# Patient Record
Sex: Male | Born: 1955 | Race: White | Hispanic: No | Marital: Married
Health system: Southern US, Community
[De-identification: ages and names within clinical notes are randomized; demographics above are authoritative.]

## PROBLEM LIST (undated history)

## (undated) DIAGNOSIS — E7800 Pure hypercholesterolemia, unspecified: Secondary | ICD-10-CM

## (undated) DIAGNOSIS — I10 Essential (primary) hypertension: Secondary | ICD-10-CM

## (undated) DIAGNOSIS — G4730 Sleep apnea, unspecified: Secondary | ICD-10-CM

## (undated) DIAGNOSIS — I639 Cerebral infarction, unspecified: Secondary | ICD-10-CM

## (undated) HISTORY — DX: Essential (primary) hypertension: I10

## (undated) HISTORY — DX: Sleep apnea, unspecified: G47.30

## (undated) HISTORY — PX: BACK SURGERY: SHX140

## (undated) HISTORY — PX: CARDIAC CATHETERIZATION: SHX172

## (undated) SURGERY — Surgical Case
Anesthesia: *Unknown

---

## 2000-09-15 ENCOUNTER — Emergency Department (HOSPITAL_COMMUNITY): Admission: EM | Admit: 2000-09-15 | Discharge: 2000-09-15 | Payer: Self-pay | Admitting: Emergency Medicine

## 2002-06-29 ENCOUNTER — Encounter: Admission: RE | Admit: 2002-06-29 | Discharge: 2002-06-29 | Payer: Self-pay | Admitting: Family Medicine

## 2002-06-29 ENCOUNTER — Encounter: Payer: Self-pay | Admitting: Family Medicine

## 2002-06-30 ENCOUNTER — Encounter: Admission: RE | Admit: 2002-06-30 | Discharge: 2002-06-30 | Payer: Self-pay | Admitting: Family Medicine

## 2002-06-30 ENCOUNTER — Encounter: Payer: Self-pay | Admitting: Family Medicine

## 2003-05-29 ENCOUNTER — Encounter: Admission: RE | Admit: 2003-05-29 | Discharge: 2003-05-29 | Payer: Self-pay | Admitting: Specialist

## 2003-07-10 ENCOUNTER — Encounter: Admission: RE | Admit: 2003-07-10 | Discharge: 2003-07-10 | Payer: Self-pay | Admitting: Neurosurgery

## 2003-09-15 ENCOUNTER — Inpatient Hospital Stay (HOSPITAL_COMMUNITY): Admission: RE | Admit: 2003-09-15 | Discharge: 2003-09-17 | Payer: Self-pay | Admitting: Neurosurgery

## 2010-02-03 HISTORY — PX: KNEE SURGERY: SHX244

## 2011-04-11 ENCOUNTER — Other Ambulatory Visit: Payer: Self-pay | Admitting: Family Medicine

## 2011-04-11 DIAGNOSIS — N281 Cyst of kidney, acquired: Secondary | ICD-10-CM

## 2011-04-15 ENCOUNTER — Ambulatory Visit
Admission: RE | Admit: 2011-04-15 | Discharge: 2011-04-15 | Disposition: A | Discharge status: Home / Self Care | Payer: BC Managed Care – PPO | Source: Ambulatory Visit | Attending: Family Medicine | Admitting: Family Medicine

## 2011-04-15 DIAGNOSIS — N281 Cyst of kidney, acquired: Secondary | ICD-10-CM

## 2012-01-17 ENCOUNTER — Encounter (HOSPITAL_COMMUNITY): Payer: Self-pay | Admitting: Internal Medicine

## 2012-01-17 ENCOUNTER — Observation Stay (HOSPITAL_COMMUNITY)
Admission: EM | Admit: 2012-01-17 | Discharge: 2012-01-20 | Disposition: A | Discharge status: Home / Self Care | Payer: BC Managed Care – PPO | Attending: Internal Medicine | Admitting: Internal Medicine

## 2012-01-17 ENCOUNTER — Emergency Department (HOSPITAL_COMMUNITY): Payer: BC Managed Care – PPO

## 2012-01-17 DIAGNOSIS — F29 Unspecified psychosis not due to a substance or known physiological condition: Secondary | ICD-10-CM | POA: Insufficient documentation

## 2012-01-17 DIAGNOSIS — R269 Unspecified abnormalities of gait and mobility: Secondary | ICD-10-CM | POA: Insufficient documentation

## 2012-01-17 DIAGNOSIS — R4789 Other speech disturbances: Secondary | ICD-10-CM | POA: Insufficient documentation

## 2012-01-17 DIAGNOSIS — G454 Transient global amnesia: Secondary | ICD-10-CM

## 2012-01-17 DIAGNOSIS — Z79899 Other long term (current) drug therapy: Secondary | ICD-10-CM | POA: Insufficient documentation

## 2012-01-17 DIAGNOSIS — G4700 Insomnia, unspecified: Secondary | ICD-10-CM | POA: Insufficient documentation

## 2012-01-17 DIAGNOSIS — R4182 Altered mental status, unspecified: Secondary | ICD-10-CM

## 2012-01-17 DIAGNOSIS — G9340 Encephalopathy, unspecified: Principal | ICD-10-CM | POA: Insufficient documentation

## 2012-01-17 DIAGNOSIS — E785 Hyperlipidemia, unspecified: Secondary | ICD-10-CM

## 2012-01-17 DIAGNOSIS — E7800 Pure hypercholesterolemia, unspecified: Secondary | ICD-10-CM | POA: Insufficient documentation

## 2012-01-17 DIAGNOSIS — H532 Diplopia: Secondary | ICD-10-CM | POA: Insufficient documentation

## 2012-01-17 DIAGNOSIS — F411 Generalized anxiety disorder: Secondary | ICD-10-CM | POA: Insufficient documentation

## 2012-01-17 HISTORY — DX: Pure hypercholesterolemia, unspecified: E78.00

## 2012-01-17 LAB — URINALYSIS, ROUTINE W REFLEX MICROSCOPIC
Glucose, UA: NEGATIVE mg/dL
Hgb urine dipstick: NEGATIVE
Leukocytes, UA: NEGATIVE
Specific Gravity, Urine: 1.011 (ref 1.005–1.030)
pH: 7 (ref 5.0–8.0)

## 2012-01-17 LAB — RAPID URINE DRUG SCREEN, HOSP PERFORMED
Amphetamines: NOT DETECTED
Benzodiazepines: NOT DETECTED
Cocaine: NOT DETECTED
Opiates: NOT DETECTED

## 2012-01-17 LAB — CBC
HCT: 39.2 % (ref 39.0–52.0)
Hemoglobin: 13.8 g/dL (ref 13.0–17.0)
MCV: 85.6 fL (ref 78.0–100.0)
RBC: 4.58 MIL/uL (ref 4.22–5.81)
RDW: 13 % (ref 11.5–15.5)
WBC: 5.9 10*3/uL (ref 4.0–10.5)

## 2012-01-17 LAB — COMPREHENSIVE METABOLIC PANEL
Albumin: 3.9 g/dL (ref 3.5–5.2)
BUN: 13 mg/dL (ref 6–23)
Calcium: 9.4 mg/dL (ref 8.4–10.5)
Creatinine, Ser: 0.9 mg/dL (ref 0.50–1.35)
GFR calc Af Amer: >90 mL/min (ref >90)
Glucose, Bld: 108 mg/dL — ABNORMAL HIGH (ref 70–99)
Potassium: 4.5 mEq/L (ref 3.5–5.1)
Total Protein: 7.2 g/dL (ref 6.0–8.3)

## 2012-01-17 MED ORDER — ASPIRIN 81 MG PO CHEW
324.0000 mg | CHEWABLE_TABLET | Freq: Once | ORAL | Status: AC
  Administered 2012-01-17: 324 mg via ORAL
  Filled 2012-01-17: qty 4
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
## 2012-01-18 ENCOUNTER — Observation Stay (HOSPITAL_COMMUNITY): Payer: BC Managed Care – PPO

## 2012-01-18 DIAGNOSIS — E785 Hyperlipidemia, unspecified: Secondary | ICD-10-CM

## 2012-01-18 DIAGNOSIS — G459 Transient cerebral ischemic attack, unspecified: Secondary | ICD-10-CM

## 2012-01-18 LAB — TROPONIN I
Troponin I: <0.3 ng/mL (ref <0.30)
Troponin I: <0.3 ng/mL (ref <0.30)
Troponin I: <0.3 ng/mL (ref <0.30)

## 2012-01-18 LAB — LIPID PANEL
Cholesterol: 186 mg/dL (ref 0–200)
VLDL: 26 mg/dL (ref 0–40)

## 2012-01-18 LAB — HEMOGLOBIN A1C: Mean Plasma Glucose: 126 mg/dL — ABNORMAL HIGH (ref <117)

## 2012-01-18 MED ORDER — PANTOPRAZOLE SODIUM 40 MG PO TBEC
40.0000 mg | DELAYED_RELEASE_TABLET | Freq: Every day | ORAL | Status: DC
  Administered 2012-01-18 – 2012-01-20 (×3): 40 mg via ORAL
  Filled 2012-01-18 (×3): qty 1

## 2012-01-18 MED ORDER — ACETAMINOPHEN 325 MG PO TABS
650.0000 mg | ORAL_TABLET | ORAL | Status: DC | PRN
  Administered 2012-01-18: 650 mg via ORAL
  Filled 2012-01-18: qty 2

## 2012-01-18 MED ORDER — ATORVASTATIN CALCIUM 40 MG PO TABS
40.0000 mg | ORAL_TABLET | Freq: Every day | ORAL | Status: DC
  Administered 2012-01-18 – 2012-01-19 (×2): 40 mg via ORAL
  Filled 2012-01-18 (×3): qty 1

## 2012-01-18 MED ORDER — ESCITALOPRAM OXALATE 20 MG PO TABS
20.0000 mg | ORAL_TABLET | Freq: Every day | ORAL | Status: DC
  Administered 2012-01-18 – 2012-01-20 (×3): 20 mg via ORAL
  Filled 2012-01-18 (×3): qty 1

## 2012-01-18 MED ORDER — ASPIRIN 325 MG PO TABS
325.0000 mg | ORAL_TABLET | Freq: Every day | ORAL | Status: DC
  Administered 2012-01-18 – 2012-01-20 (×3): 325 mg via ORAL
  Filled 2012-01-18 (×3): qty 1

## 2012-01-18 MED ORDER — SODIUM CHLORIDE 0.9 % IV SOLN
INTRAVENOUS | Status: AC
  Administered 2012-01-18: 01:00:00 via INTRAVENOUS
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
## 2012-01-18 NOTE — Progress Notes (Signed)
  Echocardiogram 2D Echocardiogram has been performed.  Jeffrey Morse 01/18/2012, 3:02 PM

## 2012-01-18 NOTE — Progress Notes (Signed)
Bilateral:  No evidence of hemodynamically significant internal carotid artery stenosis.   Vertebral artery flow is antegrade.     

## 2012-01-18 NOTE — Evaluation (Signed)
Physical Therapy Evaluation Patient Details Name: Jeffrey Morse MRN: [id number] DOB: 04/08/1955 Today's Date: 01/18/2012 Time: 4696-2952 PT Time Calculation (min): 28 min  PT Assessment / Plan / Recommendation Clinical Impression  Pt admitted with transient global amnesia. Pt currently with no physical deficits or balance deficits. Pt is safe to d/c from a PT standpoint, no further PT needs. Will not follow    PT Assessment  Patent does not need any further PT services    Follow Up Recommendations  No PT follow up    Does the patient have the potential to tolerate intense rehabilitation      Barriers to Discharge        Equipment Recommendations  None recommended by PT    Recommendations for Other Services     Frequency      Precautions / Restrictions Precautions Precautions: None Restrictions Weight Bearing Restrictions: No   Pertinent Vitals/Pain No pain      Mobility  Bed Mobility Bed Mobility: Supine to Sit;Sitting - Scoot to Edge of Bed;Sit to Supine Supine to Sit: 7: Independent Sitting - Scoot to Edge of Bed: 7: Independent Sit to Supine: 7: Independent Transfers Transfers: Sit to Stand;Stand to Sit Sit to Stand: 7: Independent Stand to Sit: 7: Independent Ambulation/Gait Ambulation/Gait Assistance: 7: Independent Ambulation Distance (Feet): 200 Feet Assistive device: None Gait Pattern: Within Functional Limits Gait velocity: normal gait speed Stairs: Yes Stairs Assistance: 7: Independent Stair Management Technique: No rails;Step to pattern;Forwards Number of Stairs: 3     Shoulder Instructions     Exercises     PT Diagnosis:    PT Problem List:   PT Treatment Interventions:     PT Goals    Visit Information  Last PT Received On: 01/18/12 Assistance Needed: +1    Subjective Data  Patient Stated Goal: to go home   Prior Functioning  Home Living Lives With: Spouse Available Help at Discharge: Family;Other (Comment) (works during  the day) Type of Home: Mobile home Home Access: Stairs to enter Secretary/administrator of Steps: 2 Entrance Stairs-Rails: Right;Left;Can reach both Home Layout: One level Bathroom Shower/Tub: Forensic scientist: Standard Bathroom Accessibility: Yes How Accessible: Accessible via walker Home Adaptive Equipment: None Prior Function Level of Independence: Independent Able to Take Stairs?: Yes Driving: Yes Vocation: Full time 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 difficulties Dominant Hand: Right    Cognition  Overall Cognitive Status: Appears within functional limits for tasks assessed/performed Arousal/Alertness: Awake/alert Orientation Level: Appears intact for tasks assessed Behavior During Session: [facility] for tasks performed    Extremity/Trunk Assessment Right Lower Extremity Assessment RLE ROM/Strength/Tone: Within functional levels RLE Sensation: WFL - Light Touch Left Lower Extremity Assessment LLE ROM/Strength/Tone: Within functional levels LLE Sensation: WFL - Light Touch   Balance Balance Balance Assessed: Yes Static Standing Balance Static Standing - Balance Support: No upper extremity supported Static Standing - Level of Assistance: 7: Independent Static Standing - Comment/# of Minutes: No deficits. Single Leg Stance - Right Leg: 1  Single Leg Stance - Left Leg: 1  Tandem Stance - Right Leg: 1  Tandem Stance - Left Leg: 1  [place] - Eyes Opened: 1  [place]homberg - Eyes Closed: 1   End of Session PT - End of Session Equipment Utilized During Treatment: Gait belt Activity Tolerance: Patient tolerated treatment well Patient left: in bed;with call bell/phone within reach;Other (comment) (with radiology ) Nurse Communication: Mobility status  GP Functional Assessment Tool Used: clinical judgement Functional Limitation: Mobility:  Walking and moving around Mobility: Walking and Moving Around Current Status  [phone]): 0 percent impaired, limited or restricted Mobility: Walking and Moving Around Goal Status [phone]): 0 percent impaired, limited or restricted Mobility: Walking and Moving Around Discharge Status [phone]): 0 percent impaired, limited or restricted   Milana Kidney 01/18/2012, 9:28 AM  01/18/2012 Milana Kidney DPT PAGER: [phone] OFFICE: [phone]

## 2012-01-18 NOTE — Progress Notes (Signed)
Occupational Therapy Note  OT order received and appreciated.  Per PT report, pt is at baseline (independent) with no acute OT needs.  Will sign off at this time. Please re-order if pt experiences functional decline.  Thanks.  01/18/2012 Cipriano Mile OTR/L Pager [phone] Office [phone]
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
## 2012-01-19 ENCOUNTER — Observation Stay (HOSPITAL_COMMUNITY): Payer: BC Managed Care – PPO

## 2012-01-19 MED ORDER — GADOBENATE DIMEGLUMINE 529 MG/ML IV SOLN
20.0000 mL | Freq: Once | INTRAVENOUS | Status: AC
  Administered 2012-01-18: 20 mL via INTRAVENOUS
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
## 2012-01-19 NOTE — Progress Notes (Signed)
EEG completed.

## 2012-01-20 DIAGNOSIS — R4182 Altered mental status, unspecified: Secondary | ICD-10-CM
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
## 2012-01-20 NOTE — Procedures (Signed)
REFERRING PHYSICIAN:  Dr. Arbutus Leas.  INDICATION FOR STUDY:  A 55 year old man with history of transient confusion and diplopia.  Study is being performed to rule out possible new onset seizure disorder.  DESCRIPTION:  This is a routine EEG study performed during wakefulness. Predominant background activity consisted of 10 Hz symmetrical alpha rhythm which attenuated well with eye opening.  Photic stimulation produced symmetrical occipital driving response.  Hyperventilation produced a normal symmetrical slowing response.  No epileptiform discharges were recorded.  There were no areas of abnormal slowing.  INTERPRETATION:  This is a normal EEG recording during wakefulness.  No evidence of an epileptic disorder was demonstrated.     Noel Christmas, MD    [id number] D:  01/19/2012 17:31:36  T:  01/20/2012 03:39:55  Job #:  [id number]

## 2012-12-21 ENCOUNTER — Encounter: Payer: Self-pay | Admitting: Podiatry

## 2012-12-21 ENCOUNTER — Ambulatory Visit (INDEPENDENT_AMBULATORY_CARE_PROVIDER_SITE_OTHER): Payer: BC Managed Care – PPO | Admitting: Podiatry

## 2012-12-21 ENCOUNTER — Ambulatory Visit (INDEPENDENT_AMBULATORY_CARE_PROVIDER_SITE_OTHER): Payer: BC Managed Care – PPO

## 2012-12-21 VITALS — Ht 71.0 in | Wt 210.0 lb

## 2012-12-21 DIAGNOSIS — R52 Pain, unspecified: Secondary | ICD-10-CM

## 2012-12-21 DIAGNOSIS — M7750 Other enthesopathy of unspecified foot: Secondary | ICD-10-CM

## 2012-12-21 MED ORDER — MELOXICAM 15 MG PO TABS: 15.0000 mg | ORAL_TABLET | Freq: Every day | ORAL | Status: DC

## 2012-12-21 NOTE — Progress Notes (Signed)
  Subjective:    Patient ID: Jeffrey Morse, male    DOB: 03-05-1955, 57 y.o.   MRN: [id number]  Foot Pain This is a new problem. The current episode started more than 1 month ago. The problem occurs constantly. The problem has been gradually worsening. The symptoms are aggravated by standing and walking. He has tried nothing for the symptoms. The treatment provided no relief.      Review of Systems  All other systems reviewed and are negative.       Objective:   Physical Exam: I have reviewed his past medical history medications and allergies. Vital signs are stable he is alert and oriented x3. Pulses are palpable bilateral. Neurologic sensorium is intact per since once the monofilament bilateral. Deep tendon reflexes are intact bilateral muscle strength + over 5 dorsiflexors plantar flexors inverters and evertors are intact. All intrinsic musculature is intact. Orthopedic evaluation demonstrates all joints distal to the ankle have a full range of motion without crepitation. He has pain on end range of motion particularly plantar flexion at the second metatarsophalangeal joint bilateral. Pain is increased left second metatarsophalangeal joint with medial deviation of the toe. Radiographic evaluation demonstrates an elongated second metatarsal bilateral with medial medial deviation of the second digit at the metatarsophalangeal joint left. Cutaneous evaluation demonstrates supple well hydrated cutis no erythema edema cellulitis drainage or odor with mild exception of the edema overlying the second metatarsophalangeal joint left greater than right        Assessment & Plan:  Assessment: Capsulitis second metatarsophalangeal joint bilateral.  Plan: Discussed etiology pathology conservative versus surgical therapies. I injected para-articular only today Kenalog 20 mg to the point of maximal tenderness the bilateral second metatarsophalangeal joint. I also included a prescription for Mobic 15 mg  1 by mouth daily. I will followup with him in one month. We discussed in great detail today different types of shoes, prepped purchase and were not to purchase.

## 2013-01-20 ENCOUNTER — Ambulatory Visit: Payer: BC Managed Care – PPO | Admitting: Podiatry

## 2013-06-29 ENCOUNTER — Telehealth: Payer: Self-pay | Admitting: *Deleted

## 2013-06-29 NOTE — Telephone Encounter (Signed)
I returned his call.  He stated that he's starting to have a problem with his toes again.  The toe next to the big toe is starting to separate and it's giving him a problem.  He asked what should he do, should he see Dr. Al Corpus again.  I told him yes.  I transferred him to a scheduler to schedule an appointment with Dr. Al Corpus.

## 2013-07-12 ENCOUNTER — Ambulatory Visit: Payer: BC Managed Care – PPO | Admitting: Podiatry

## 2013-07-14 ENCOUNTER — Ambulatory Visit (INDEPENDENT_AMBULATORY_CARE_PROVIDER_SITE_OTHER): Payer: 59 | Admitting: Podiatry

## 2013-07-14 ENCOUNTER — Encounter: Payer: Self-pay | Admitting: Podiatry

## 2013-07-14 DIAGNOSIS — M2040 Other hammer toe(s) (acquired), unspecified foot: Secondary | ICD-10-CM

## 2013-07-14 DIAGNOSIS — M779 Enthesopathy, unspecified: Principal | ICD-10-CM

## 2013-07-14 DIAGNOSIS — M778 Other enthesopathies, not elsewhere classified: Secondary | ICD-10-CM

## 2013-07-14 DIAGNOSIS — M7750 Other enthesopathy of unspecified foot: Secondary | ICD-10-CM

## 2013-07-14 MED ORDER — DICLOFENAC SODIUM 75 MG PO TBEC: 75.0000 mg | DELAYED_RELEASE_TABLET | Freq: Two times a day (BID) | ORAL | Status: DC

## 2013-07-15 NOTE — Progress Notes (Signed)
He presents today for followup regarding capsulitis and hammertoe deformity of the second metatarsophalangeal joint of the left foot he states it seems to be getting worse. He states the toe is now starting to deviate from of the toes he points to the second digit of the left foot. He denies any further trauma he denies fever chills nausea vomiting muscle aches and pains.  Objective: Pulses are stable he is alert and oriented x3. Pulses are strongly palpable bilateral. He second digit of his left foot is arthritic in a plantar flexor position at the PIPJ. He has medial deviation of the second toe at the metatarsophalangeal joint more than likely secondary to rupture of the lateral collateral ligament due to chronic inflammation. Radiographic evaluation today does demonstrate rigid hammertoe deformity with elongated second metatarsal.  Assessment: Elongated plantarflexed second metatarsal with capsulitis of the metatarsophalangeal joint. 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  Plan: We discussed etiology pathology conservative versus surgical therapies secondary to failure of conservative therapies from previous visit at this point surgical intervention was discussed in great detail today we went over consent form line bylined number by number giving him ample time to ask questions he saw fit regarding a second metatarsal osteotomy of his left foot with screw and hammertoe repair second left with screw. He understands that her pain may be necessary. He was dispensed a Cam Walker today for the use postoperatively. I will followup with him near future for surgery.

## 2015-01-23 ENCOUNTER — Telehealth: Payer: Self-pay | Admitting: *Deleted

## 2015-02-02 NOTE — Telephone Encounter (Signed)
I attempted to call patient.  I left a message for a return call.  I'm returning your call.  You had called the other day.  "I have spoken to someone.  You were out when I called.  I just needed the prices for some codes.  They have helped me with that."  Do you have a date for surgery?  "No, I was just checking on the pricing.  I'll call later to schedule."

## 2016-05-07 ENCOUNTER — Institutional Professional Consult (permissible substitution): Payer: Self-pay | Admitting: Pulmonary Disease

## 2016-06-12 ENCOUNTER — Institutional Professional Consult (permissible substitution): Payer: Self-pay | Admitting: Pulmonary Disease

## 2016-09-01 ENCOUNTER — Ambulatory Visit (INDEPENDENT_AMBULATORY_CARE_PROVIDER_SITE_OTHER): Payer: BC Managed Care – PPO | Admitting: Pulmonary Disease

## 2016-09-01 ENCOUNTER — Encounter: Payer: Self-pay | Admitting: Pulmonary Disease

## 2016-09-01 DIAGNOSIS — G4733 Obstructive sleep apnea (adult) (pediatric): Secondary | ICD-10-CM | POA: Diagnosis not present

## 2016-09-01 NOTE — Patient Instructions (Signed)
Home sleep study. We discussed 2 options of treatment- CPAP and dental appliance  We will keep you pricing options based on study results

## 2016-09-01 NOTE — Assessment & Plan Note (Signed)
Home sleep study. We discussed 2 options of treatment- CPAP and dental appliance  We will keep you pricing options based on study results   The pathophysiology of obstructive sleep apnea , it's cardiovascular consequences & modes of treatment including CPAP were discused with the patient in detail & they evidenced understanding.

## 2016-09-01 NOTE — Progress Notes (Signed)
Subjective:    Patient ID: Jeffrey Morse, male    DOB: 06-May-1955, 61 y.o.   MRN: [id number]  HPI  Chief Complaint  Patient presents with  . Advice Only    Pt was referred by Dr. Manus Gunning due to being very restless at night and wakes up very tired. States that his wife will wake him up due to him stopping breathing and states that he also snores. Pt stated he had a sleep study done about 10 years ago.   61 year old man presents for evaluation of sleep-disordered breathing. 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 He reports restless and non-refreshing sleep and his wife has witnessed apneas. Several family members have complained about his snoring. He underwent an overnight polysomnogram in 2011 that showed an RDI of 24/hour with an AHI of 13/hour. CPAP was recommended but due to insurance costs, he did not proceed with this.  He is now ready to seek another evaluation for the same. Epworth sleepiness score is 8. Bedtime is between 9 and 10 PM, sleep latency is about 30 minutes, he sleeps on his side with one pillow reports numerous nocturnal awakenings and restless sleep with turning side to side. He is out of bed by 6:30 AM feeling tired with occasional headaches but denies dryness of mouth. There is no history suggestive of cataplexy, sleep paralysis or parasomnias  He is gained about 5 pounds in the last 5 years. He has hyperlipidemia. He quit smoking 20 years ago   Past Medical History:  Diagnosis Date  . Hypercholesteremia   . Hypertension    Borderline hypertension  . Sleep apnea      Past Surgical History:  Procedure Laterality Date  . BACK SURGERY    . KNEE SURGERY Left 2012    No Known Allergies   Social History   Social History  . Marital status: Divorced    Spouse name: N/A  . Number of children: N/A  . Years of education: N/A   Occupational History  . Not on file.   Social History Main Topics  . Smoking status: Former Games developer  . Smokeless  tobacco: Former Neurosurgeon     Comment: pt stated he was a social smoker in high school  . Alcohol use No  . Drug use: No  . Sexual activity: Not on file   Other Topics Concern  . Not on file   Social History Narrative  . No narrative on file      Family History  Problem Relation Age of Onset  . Colon cancer Mother   . Stroke Father        Review of Systems   Positive for acid heartburn and indigestion, tooth problems and nasal congestion and mild anxiety   Constitutional: negative for anorexia, fevers and sweats  Eyes: negative for irritation, redness and visual disturbance  Ears, nose, mouth, throat, and face: negative for earaches, epistaxis, nasal congestion and sore throat  Respiratory: negative for cough, dyspnea on exertion, sputum and wheezing  Cardiovascular: negative for chest pain, dyspnea, lower extremity edema, orthopnea, palpitations and syncope  Gastrointestinal: negative for abdominal pain, constipation, diarrhea, melena, nausea and vomiting  Genitourinary:negative for dysuria, frequency and hematuria  Hematologic/lymphatic: negative for bleeding, easy bruising and lymphadenopathy  Musculoskeletal:negative for arthralgias, muscle weakness and stiff joints  Neurological: negative for coordination problems, gait problems, headaches and weakness  Endocrine: negative for diabetic symptoms including polydipsia, polyuria and weight loss     Objective:   Physical Exam  Gen. Pleasant, obese, in no distress ENT - no lesions, no post nasal drip Neck: No JVD, no thyromegaly, no carotid bruits Lungs: no use of accessory muscles, no dullness to percussion, decreased without rales or rhonchi  Cardiovascular: Rhythm regular, heart sounds  normal, no murmurs or gallops, no peripheral edema Musculoskeletal: No deformities, no cyanosis or clubbing , no tremors       Assessment & Plan:

## 2017-07-10 ENCOUNTER — Telehealth: Payer: Self-pay | Admitting: Pulmonary Disease

## 2017-07-10 NOTE — Telephone Encounter (Signed)
Patient called back he would like to talk back with Misty Stanley regarding insurance needs- pt can be reached at [phone]

## 2017-07-10 NOTE — Telephone Encounter (Signed)
Spoke with pt. He is wanting to speak to Misty Stanley about this matter.

## 2017-07-10 NOTE — Telephone Encounter (Signed)
Pt is returning call. Pt is requesting to talk to [place]. Pt states when he called insurance company they informed him that he needs to have the HST and follow the recommendations given after HST.   Cb is [phone].

## 2017-07-10 NOTE — Telephone Encounter (Signed)
Returned call to Patient. Patient stated that his life insurance company was wanting to drop him, because he had missed his appointment with pulmonary.  He had sleep consult referred by Dr Lawerance Bach with [facility] 09/01/16, that he was at- E signature and DOB seen in chart.  Patient could not remember coming to visit.  Patient was concerned that his information and his son (has same first and last name) was crossed.  Patient and son OSA/HST information was not crossed.  [facility] had recommended that he have a HST (ordered 09/01/16), look into dental appliance or CPAP.  He did not have HST.  HST order is still active until 09/01/17.  Informed Patient he could still have HST, that [facility] recommended for OSA, but Patient refused.  Patient stated he just needed to know that he had not missed appointment with [facility].  No appointment missed, he did not get HST. Suggested to him, to call and see what information he may need for his life insurance.  He stated that he would call his life insurance and would call back if he needed any OV notes or information sent to life insurance.

## 2017-07-13 NOTE — Telephone Encounter (Addendum)
lmtcb x1 for pt. If pt would like to proceed with HST. Pt will need to call back after study to schedule OV.

## 2017-07-14 NOTE — Telephone Encounter (Signed)
ATC pt, no answer. Left message for pt to call back.  

## 2017-07-14 NOTE — Telephone Encounter (Signed)
Spoke with the pt  He declined sleep study  He states he needs a copy if his last ov note and the documentation on how he paid for his visit   [name], do you know how I could get the documentation on how he paid for his copay? Please advise thanks

## 2017-07-14 NOTE — Telephone Encounter (Signed)
Patient returned call, CB is [phone]

## 2017-07-16 NOTE — Telephone Encounter (Signed)
09/01/16 is the date he needs, thanks

## 2017-07-16 NOTE — Telephone Encounter (Signed)
I can print a summary from his account. Do you know the date of service he is needing this for? I can mail it to him once I have it printed. - Thanks- [employer]

## 2017-07-16 NOTE — Telephone Encounter (Signed)
Printed copay statement and mailed to patient -pr

## 2018-11-25 ENCOUNTER — Other Ambulatory Visit: Payer: Self-pay | Admitting: Neurosurgery

## 2018-11-25 DIAGNOSIS — M48061 Spinal stenosis, lumbar region without neurogenic claudication: Secondary | ICD-10-CM

## 2018-11-29 ENCOUNTER — Other Ambulatory Visit: Payer: Self-pay

## 2018-11-29 ENCOUNTER — Ambulatory Visit
Admission: RE | Admit: 2018-11-29 | Discharge: 2018-11-29 | Disposition: A | Discharge status: Home / Self Care | Payer: BC Managed Care – PPO | Source: Ambulatory Visit | Attending: Neurosurgery | Admitting: Neurosurgery

## 2018-11-29 DIAGNOSIS — M48061 Spinal stenosis, lumbar region without neurogenic claudication: Secondary | ICD-10-CM

## 2018-11-29 MED ORDER — METHYLPREDNISOLONE ACETATE 40 MG/ML INJ SUSP (RADIOLOG
120.0000 mg | Freq: Once | INTRAMUSCULAR | Status: AC
  Administered 2018-11-29: 120 mg via EPIDURAL

## 2018-11-29 MED ORDER — IOPAMIDOL (ISOVUE-M 200) INJECTION 41%
1.0000 mL | Freq: Once | INTRAMUSCULAR | Status: AC
  Administered 2018-11-29: 1 mL via EPIDURAL

## 2018-11-29 NOTE — Discharge Instructions (Signed)

## 2018-12-14 ENCOUNTER — Other Ambulatory Visit: Payer: Self-pay | Admitting: Neurosurgery

## 2018-12-14 DIAGNOSIS — M48061 Spinal stenosis, lumbar region without neurogenic claudication: Secondary | ICD-10-CM

## 2018-12-16 ENCOUNTER — Ambulatory Visit
Admission: RE | Admit: 2018-12-16 | Discharge: 2018-12-16 | Disposition: A | Discharge status: Home / Self Care | Payer: BC Managed Care – PPO | Source: Ambulatory Visit | Attending: Neurosurgery | Admitting: Neurosurgery

## 2018-12-16 ENCOUNTER — Other Ambulatory Visit: Payer: Self-pay

## 2018-12-16 DIAGNOSIS — M48061 Spinal stenosis, lumbar region without neurogenic claudication: Secondary | ICD-10-CM

## 2018-12-16 MED ORDER — METHYLPREDNISOLONE ACETATE 40 MG/ML INJ SUSP (RADIOLOG
120.0000 mg | Freq: Once | INTRAMUSCULAR | Status: AC
  Administered 2018-12-16: 120 mg via EPIDURAL

## 2018-12-16 MED ORDER — IOPAMIDOL (ISOVUE-M 200) INJECTION 41%
1.0000 mL | Freq: Once | INTRAMUSCULAR | Status: AC
  Administered 2018-12-16: 1 mL via EPIDURAL

## 2020-02-15 ENCOUNTER — Inpatient Hospital Stay (HOSPITAL_COMMUNITY)
Admission: EM | Disposition: A | Discharge status: Rehabilitation Facility | Payer: Self-pay | Source: Home / Self Care | Attending: Internal Medicine

## 2020-02-15 ENCOUNTER — Inpatient Hospital Stay (HOSPITAL_COMMUNITY): Payer: BC Managed Care – PPO

## 2020-02-15 ENCOUNTER — Inpatient Hospital Stay (HOSPITAL_COMMUNITY)
Admission: EM | Admit: 2020-02-15 | Discharge: 2020-02-28 | DRG: 215 | Disposition: A | Discharge status: Rehabilitation Facility | Payer: BC Managed Care – PPO | Attending: Internal Medicine | Admitting: Internal Medicine

## 2020-02-15 DIAGNOSIS — I2510 Atherosclerotic heart disease of native coronary artery without angina pectoris: Secondary | ICD-10-CM

## 2020-02-15 DIAGNOSIS — I6330 Cerebral infarction due to thrombosis of unspecified cerebral artery: Secondary | ICD-10-CM | POA: Insufficient documentation

## 2020-02-15 DIAGNOSIS — I213 ST elevation (STEMI) myocardial infarction of unspecified site: Secondary | ICD-10-CM

## 2020-02-15 DIAGNOSIS — Z4659 Encounter for fitting and adjustment of other gastrointestinal appliance and device: Secondary | ICD-10-CM

## 2020-02-15 DIAGNOSIS — K567 Ileus, unspecified: Secondary | ICD-10-CM

## 2020-02-15 DIAGNOSIS — I469 Cardiac arrest, cause unspecified: Secondary | ICD-10-CM | POA: Diagnosis not present

## 2020-02-15 DIAGNOSIS — I609 Nontraumatic subarachnoid hemorrhage, unspecified: Secondary | ICD-10-CM

## 2020-02-15 DIAGNOSIS — I2121 ST elevation (STEMI) myocardial infarction involving left circumflex coronary artery: Secondary | ICD-10-CM

## 2020-02-15 DIAGNOSIS — R570 Cardiogenic shock: Secondary | ICD-10-CM | POA: Diagnosis present

## 2020-02-15 DIAGNOSIS — Z0189 Encounter for other specified special examinations: Secondary | ICD-10-CM

## 2020-02-15 DIAGNOSIS — Z9289 Personal history of other medical treatment: Secondary | ICD-10-CM

## 2020-02-15 DIAGNOSIS — R0682 Tachypnea, not elsewhere classified: Secondary | ICD-10-CM

## 2020-02-15 DIAGNOSIS — R1110 Vomiting, unspecified: Secondary | ICD-10-CM

## 2020-02-15 DIAGNOSIS — R110 Nausea: Secondary | ICD-10-CM

## 2020-02-15 DIAGNOSIS — I5041 Acute combined systolic (congestive) and diastolic (congestive) heart failure: Secondary | ICD-10-CM

## 2020-02-15 DIAGNOSIS — J9601 Acute respiratory failure with hypoxia: Secondary | ICD-10-CM

## 2020-02-15 DIAGNOSIS — Z955 Presence of coronary angioplasty implant and graft: Secondary | ICD-10-CM

## 2020-02-15 DIAGNOSIS — Z01818 Encounter for other preprocedural examination: Secondary | ICD-10-CM

## 2020-02-15 DIAGNOSIS — Z978 Presence of other specified devices: Secondary | ICD-10-CM

## 2020-02-15 DIAGNOSIS — N170 Acute kidney failure with tubular necrosis: Secondary | ICD-10-CM | POA: Diagnosis not present

## 2020-02-15 DIAGNOSIS — E785 Hyperlipidemia, unspecified: Secondary | ICD-10-CM | POA: Diagnosis present

## 2020-02-15 DIAGNOSIS — E7800 Pure hypercholesterolemia, unspecified: Secondary | ICD-10-CM | POA: Diagnosis present

## 2020-02-15 DIAGNOSIS — I6349 Cerebral infarction due to embolism of other cerebral artery: Secondary | ICD-10-CM | POA: Diagnosis not present

## 2020-02-15 DIAGNOSIS — E876 Hypokalemia: Secondary | ICD-10-CM | POA: Diagnosis not present

## 2020-02-15 DIAGNOSIS — T82817A Embolism of cardiac prosthetic devices, implants and grafts, initial encounter: Secondary | ICD-10-CM | POA: Diagnosis not present

## 2020-02-15 DIAGNOSIS — J9811 Atelectasis: Secondary | ICD-10-CM | POA: Diagnosis not present

## 2020-02-15 DIAGNOSIS — R260 Ataxic gait: Secondary | ICD-10-CM | POA: Diagnosis not present

## 2020-02-15 DIAGNOSIS — Y718 Miscellaneous cardiovascular devices associated with adverse incidents, not elsewhere classified: Secondary | ICD-10-CM | POA: Diagnosis not present

## 2020-02-15 DIAGNOSIS — Z800 Family history of malignant neoplasm of digestive organs: Secondary | ICD-10-CM

## 2020-02-15 DIAGNOSIS — I639 Cerebral infarction, unspecified: Secondary | ICD-10-CM | POA: Diagnosis not present

## 2020-02-15 DIAGNOSIS — I2119 ST elevation (STEMI) myocardial infarction involving other coronary artery of inferior wall: Principal | ICD-10-CM | POA: Diagnosis present

## 2020-02-15 DIAGNOSIS — G8194 Hemiplegia, unspecified affecting left nondominant side: Secondary | ICD-10-CM | POA: Diagnosis not present

## 2020-02-15 DIAGNOSIS — R609 Edema, unspecified: Secondary | ICD-10-CM | POA: Diagnosis not present

## 2020-02-15 DIAGNOSIS — Z87891 Personal history of nicotine dependence: Secondary | ICD-10-CM

## 2020-02-15 DIAGNOSIS — I472 Ventricular tachycardia: Secondary | ICD-10-CM | POA: Diagnosis present

## 2020-02-15 DIAGNOSIS — I69354 Hemiplegia and hemiparesis following cerebral infarction affecting left non-dominant side: Secondary | ICD-10-CM | POA: Diagnosis not present

## 2020-02-15 DIAGNOSIS — I462 Cardiac arrest due to underlying cardiac condition: Secondary | ICD-10-CM | POA: Diagnosis present

## 2020-02-15 DIAGNOSIS — I5021 Acute systolic (congestive) heart failure: Secondary | ICD-10-CM | POA: Diagnosis present

## 2020-02-15 DIAGNOSIS — M7989 Other specified soft tissue disorders: Secondary | ICD-10-CM | POA: Diagnosis not present

## 2020-02-15 DIAGNOSIS — Z20822 Contact with and (suspected) exposure to covid-19: Secondary | ICD-10-CM | POA: Diagnosis present

## 2020-02-15 DIAGNOSIS — R29719 NIHSS score 19: Secondary | ICD-10-CM | POA: Diagnosis not present

## 2020-02-15 DIAGNOSIS — G928 Other toxic encephalopathy: Secondary | ICD-10-CM | POA: Diagnosis not present

## 2020-02-15 DIAGNOSIS — I110 Hypertensive heart disease with heart failure: Secondary | ICD-10-CM | POA: Diagnosis present

## 2020-02-15 DIAGNOSIS — I82611 Acute embolism and thrombosis of superficial veins of right upper extremity: Secondary | ICD-10-CM | POA: Diagnosis not present

## 2020-02-15 DIAGNOSIS — I4901 Ventricular fibrillation: Secondary | ICD-10-CM | POA: Diagnosis present

## 2020-02-15 DIAGNOSIS — R410 Disorientation, unspecified: Secondary | ICD-10-CM | POA: Diagnosis not present

## 2020-02-15 DIAGNOSIS — I610 Nontraumatic intracerebral hemorrhage in hemisphere, subcortical: Secondary | ICD-10-CM | POA: Diagnosis not present

## 2020-02-15 DIAGNOSIS — M549 Dorsalgia, unspecified: Secondary | ICD-10-CM | POA: Diagnosis present

## 2020-02-15 DIAGNOSIS — R471 Dysarthria and anarthria: Secondary | ICD-10-CM | POA: Diagnosis not present

## 2020-02-15 DIAGNOSIS — R1310 Dysphagia, unspecified: Secondary | ICD-10-CM | POA: Diagnosis not present

## 2020-02-15 DIAGNOSIS — I619 Nontraumatic intracerebral hemorrhage, unspecified: Secondary | ICD-10-CM | POA: Diagnosis not present

## 2020-02-15 DIAGNOSIS — G4700 Insomnia, unspecified: Secondary | ICD-10-CM | POA: Diagnosis not present

## 2020-02-15 DIAGNOSIS — D696 Thrombocytopenia, unspecified: Secondary | ICD-10-CM | POA: Diagnosis not present

## 2020-02-15 DIAGNOSIS — G8929 Other chronic pain: Secondary | ICD-10-CM | POA: Diagnosis present

## 2020-02-15 DIAGNOSIS — G9341 Metabolic encephalopathy: Secondary | ICD-10-CM | POA: Diagnosis not present

## 2020-02-15 DIAGNOSIS — J690 Pneumonitis due to inhalation of food and vomit: Secondary | ICD-10-CM | POA: Diagnosis not present

## 2020-02-15 DIAGNOSIS — G9340 Encephalopathy, unspecified: Secondary | ICD-10-CM | POA: Diagnosis not present

## 2020-02-15 DIAGNOSIS — Z823 Family history of stroke: Secondary | ICD-10-CM

## 2020-02-15 DIAGNOSIS — G4730 Sleep apnea, unspecified: Secondary | ICD-10-CM | POA: Diagnosis present

## 2020-02-15 DIAGNOSIS — T4275XA Adverse effect of unspecified antiepileptic and sedative-hypnotic drugs, initial encounter: Secondary | ICD-10-CM | POA: Diagnosis not present

## 2020-02-15 HISTORY — PX: LEFT HEART CATH AND CORONARY ANGIOGRAPHY: CATH118249

## 2020-02-15 HISTORY — DX: Pure hypercholesterolemia, unspecified: E78.00

## 2020-02-15 HISTORY — PX: VENTRICULAR ASSIST DEVICE INSERTION: CATH118273

## 2020-02-15 HISTORY — PX: CORONARY STENT INTERVENTION: CATH118234

## 2020-02-15 HISTORY — DX: Sleep apnea, unspecified: G47.30

## 2020-02-15 HISTORY — PX: RIGHT HEART CATH: CATH118263

## 2020-02-15 HISTORY — DX: Essential (primary) hypertension: I10

## 2020-02-15 LAB — POCT I-STAT 7, (LYTES, BLD GAS, ICA,H+H)
Acid-base deficit: 5 mmol/L — ABNORMAL HIGH (ref 0.0–2.0)
Acid-base deficit: 4 mmol/L — ABNORMAL HIGH (ref 0.0–2.0)
Bicarbonate: 22.3 mmol/L (ref 20.0–28.0)
Bicarbonate: 21.6 mmol/L (ref 20.0–28.0)
Calcium, Ion: 1.13 mmol/L — ABNORMAL LOW (ref 1.15–1.40)
Calcium, Ion: 1.05 mmol/L — ABNORMAL LOW (ref 1.15–1.40)
HCT: 43 % (ref 39.0–52.0)
HCT: 52 % (ref 39.0–52.0)
Hemoglobin: 14.6 g/dL (ref 13.0–17.0)
Hemoglobin: 17.7 g/dL — ABNORMAL HIGH (ref 13.0–17.0)
O2 Saturation: 86 %
O2 Saturation: 99 %
Patient temperature: 35.9
Potassium: 2.8 mmol/L — ABNORMAL LOW (ref 3.5–5.1)
Potassium: 5.4 mmol/L — ABNORMAL HIGH (ref 3.5–5.1)
Sodium: 139 mmol/L (ref 135–145)
Sodium: 138 mmol/L (ref 135–145)
TCO2: 24 mmol/L (ref 22–32)
TCO2: 23 mmol/L (ref 22–32)
pCO2 arterial: 48.9 mmHg — ABNORMAL HIGH (ref 32.0–48.0)
pCO2 arterial: 38.1 mmHg (ref 32.0–48.0)
pH, Arterial: 7.268 — ABNORMAL LOW (ref 7.350–7.450)
pH, Arterial: 7.357 (ref 7.350–7.450)
pO2, Arterial: 58 mmHg — ABNORMAL LOW (ref 83.0–108.0)
pO2, Arterial: 137 mmHg — ABNORMAL HIGH (ref 83.0–108.0)

## 2020-02-15 LAB — BASIC METABOLIC PANEL
Anion gap: 13 (ref 5–15)
Anion gap: 11 (ref 5–15)
Anion gap: 10 (ref 5–15)
BUN: 19 mg/dL (ref 8–23)
BUN: 21 mg/dL (ref 8–23)
BUN: 22 mg/dL (ref 8–23)
CO2: 22 mmol/L (ref 22–32)
CO2: 17 mmol/L — ABNORMAL LOW (ref 22–32)
CO2: 20 mmol/L — ABNORMAL LOW (ref 22–32)
Calcium: 8.1 mg/dL — ABNORMAL LOW (ref 8.9–10.3)
Calcium: 8.2 mg/dL — ABNORMAL LOW (ref 8.9–10.3)
Calcium: 8.1 mg/dL — ABNORMAL LOW (ref 8.9–10.3)
Chloride: 103 mmol/L (ref 98–111)
Chloride: 108 mmol/L (ref 98–111)
Chloride: 107 mmol/L (ref 98–111)
Creatinine, Ser: 1.08 mg/dL (ref 0.61–1.24)
Creatinine, Ser: 1.28 mg/dL — ABNORMAL HIGH (ref 0.61–1.24)
Creatinine, Ser: 1.48 mg/dL — ABNORMAL HIGH (ref 0.61–1.24)
GFR, Estimated: >60 mL/min (ref >60)
GFR, Estimated: >60 mL/min (ref >60)
GFR, Estimated: 53 mL/min — ABNORMAL LOW (ref >60)
Glucose, Bld: 333 mg/dL — ABNORMAL HIGH (ref 70–99)
Glucose, Bld: 239 mg/dL — ABNORMAL HIGH (ref 70–99)
Glucose, Bld: 266 mg/dL — ABNORMAL HIGH (ref 70–99)
Potassium: 3.2 mmol/L — ABNORMAL LOW (ref 3.5–5.1)
Potassium: 4.2 mmol/L (ref 3.5–5.1)
Potassium: 4 mmol/L (ref 3.5–5.1)
Sodium: 138 mmol/L (ref 135–145)
Sodium: 136 mmol/L (ref 135–145)
Sodium: 137 mmol/L (ref 135–145)

## 2020-02-15 LAB — RESP PANEL BY RT-PCR (FLU A&B, COVID) ARPGX2
Influenza A by PCR: NEGATIVE
Influenza B by PCR: NEGATIVE
SARS Coronavirus 2 by RT PCR: NEGATIVE

## 2020-02-15 LAB — POCT I-STAT EG7
Acid-base deficit: 3 mmol/L — ABNORMAL HIGH (ref 0.0–2.0)
Bicarbonate: 24 mmol/L (ref 20.0–28.0)
Calcium, Ion: 1.13 mmol/L — ABNORMAL LOW (ref 1.15–1.40)
HCT: 44 % (ref 39.0–52.0)
Hemoglobin: 15 g/dL (ref 13.0–17.0)
O2 Saturation: 68 %
Potassium: 2.8 mmol/L — ABNORMAL LOW (ref 3.5–5.1)
Sodium: 140 mmol/L (ref 135–145)
TCO2: 25 mmol/L (ref 22–32)
pCO2, Ven: 51.1 mmHg (ref 44.0–60.0)
pH, Ven: 7.279 (ref 7.250–7.430)
pO2, Ven: 40 mmHg (ref 32.0–45.0)

## 2020-02-15 LAB — TROPONIN I (HIGH SENSITIVITY)
Troponin I (High Sensitivity): 195 ng/L
Troponin I (High Sensitivity): 4374 ng/L (ref <18)

## 2020-02-15 LAB — COMPREHENSIVE METABOLIC PANEL
ALT: 54 U/L — ABNORMAL HIGH (ref 0–44)
AST: 73 U/L — ABNORMAL HIGH (ref 15–41)
Albumin: 3.5 g/dL (ref 3.5–5.0)
Alkaline Phosphatase: 91 U/L (ref 38–126)
Anion gap: 17 — ABNORMAL HIGH (ref 5–15)
BUN: 19 mg/dL (ref 8–23)
CO2: 15 mmol/L — ABNORMAL LOW (ref 22–32)
Calcium: 8.7 mg/dL — ABNORMAL LOW (ref 8.9–10.3)
Chloride: 104 mmol/L (ref 98–111)
Creatinine, Ser: 1.26 mg/dL — ABNORMAL HIGH (ref 0.61–1.24)
GFR, Estimated: >60 mL/min (ref >60)
Glucose, Bld: 324 mg/dL — ABNORMAL HIGH (ref 70–99)
Potassium: 2.9 mmol/L — ABNORMAL LOW (ref 3.5–5.1)
Sodium: 136 mmol/L (ref 135–145)
Total Bilirubin: 0.8 mg/dL (ref 0.3–1.2)
Total Protein: 6.1 g/dL — ABNORMAL LOW (ref 6.5–8.1)

## 2020-02-15 LAB — CBC
HCT: 39.2 % (ref 39.0–52.0)
Hemoglobin: 12.8 g/dL — ABNORMAL LOW (ref 13.0–17.0)
MCH: 28.8 pg (ref 26.0–34.0)
MCHC: 32.7 g/dL (ref 30.0–36.0)
MCV: 88.3 fL (ref 80.0–100.0)
Platelets: 311 10*3/uL (ref 150–400)
RBC: 4.44 MIL/uL (ref 4.22–5.81)
RDW: 13.1 % (ref 11.5–15.5)
WBC: 19.2 10*3/uL — ABNORMAL HIGH (ref 4.0–10.5)
nRBC: 0 % (ref 0.0–0.2)

## 2020-02-15 LAB — CBC WITH DIFFERENTIAL/PLATELET
Abs Immature Granulocytes: 0.1 K/uL — ABNORMAL HIGH (ref 0.00–0.07)
Basophils Absolute: 0.1 K/uL (ref 0.0–0.1)
Basophils Relative: 1 %
Eosinophils Absolute: 0.1 K/uL (ref 0.0–0.5)
Eosinophils Relative: 1 %
HCT: 45.7 % (ref 39.0–52.0)
Hemoglobin: 14.4 g/dL (ref 13.0–17.0)
Lymphocytes Relative: 37 %
Lymphs Abs: 5.3 K/uL — ABNORMAL HIGH (ref 0.7–4.0)
MCH: 28.6 pg (ref 26.0–34.0)
MCHC: 31.5 g/dL (ref 30.0–36.0)
MCV: 90.7 fL (ref 80.0–100.0)
Monocytes Absolute: 0.7 K/uL (ref 0.1–1.0)
Monocytes Relative: 5 %
Myelocytes: 1 %
Neutro Abs: 7.8 K/uL — ABNORMAL HIGH (ref 1.7–7.7)
Neutrophils Relative %: 55 %
Platelets: 280 K/uL (ref 150–400)
RBC: 5.04 MIL/uL (ref 4.22–5.81)
RDW: 13.1 % (ref 11.5–15.5)
WBC: 14.2 K/uL — ABNORMAL HIGH (ref 4.0–10.5)
nRBC: 0 /100{WBCs}
nRBC: 0.1 % (ref 0.0–0.2)

## 2020-02-15 LAB — GLUCOSE, CAPILLARY
Glucose-Capillary: 234 mg/dL — ABNORMAL HIGH (ref 70–99)
Glucose-Capillary: 244 mg/dL — ABNORMAL HIGH (ref 70–99)
Glucose-Capillary: 262 mg/dL — ABNORMAL HIGH (ref 70–99)
Glucose-Capillary: 290 mg/dL — ABNORMAL HIGH (ref 70–99)

## 2020-02-15 LAB — COOXEMETRY PANEL
Carboxyhemoglobin: 0.5 % (ref 0.5–1.5)
Carboxyhemoglobin: 0.5 % (ref 0.5–1.5)
Carboxyhemoglobin: 0.5 % (ref 0.5–1.5)
Methemoglobin: 1.1 % (ref 0.0–1.5)
Methemoglobin: 1.1 % (ref 0.0–1.5)
Methemoglobin: 1.2 % (ref 0.0–1.5)
O2 Saturation: 55.2 %
O2 Saturation: 47.6 %
O2 Saturation: 57.9 %
Total hemoglobin: 14.2 g/dL (ref 12.0–16.0)
Total hemoglobin: 13.2 g/dL (ref 12.0–16.0)
Total hemoglobin: 13.1 g/dL (ref 12.0–16.0)

## 2020-02-15 LAB — LIPID PANEL
Cholesterol: 207 mg/dL — ABNORMAL HIGH (ref 0–200)
HDL: 48 mg/dL (ref >40)
LDL Cholesterol: 135 mg/dL — ABNORMAL HIGH (ref 0–99)
Total CHOL/HDL Ratio: 4.3 RATIO
Triglycerides: 119 mg/dL (ref <150)
VLDL: 24 mg/dL (ref 0–40)

## 2020-02-15 LAB — LACTIC ACID, PLASMA
Lactic Acid, Venous: 6.1 mmol/L (ref 0.5–1.9)
Lactic Acid, Venous: 3.7 mmol/L (ref 0.5–1.9)

## 2020-02-15 LAB — POCT ACTIVATED CLOTTING TIME
Activated Clotting Time: 243 seconds
Activated Clotting Time: 255 seconds
Activated Clotting Time: 190 seconds
Activated Clotting Time: 178 seconds
Activated Clotting Time: 166 seconds
Activated Clotting Time: 154 seconds
Activated Clotting Time: 244 seconds

## 2020-02-15 LAB — MAGNESIUM
Magnesium: 1.7 mg/dL (ref 1.7–2.4)
Magnesium: 2 mg/dL (ref 1.7–2.4)

## 2020-02-15 LAB — PROTIME-INR
INR: 1.1 (ref 0.8–1.2)
Prothrombin Time: 14 s (ref 11.4–15.2)

## 2020-02-15 LAB — APTT: aPTT: 28 s (ref 24–36)

## 2020-02-15 LAB — HEMOGLOBIN A1C
Hgb A1c MFr Bld: 6 % — ABNORMAL HIGH (ref 4.8–5.6)
Mean Plasma Glucose: 125.5 mg/dL

## 2020-02-15 LAB — ECHOCARDIOGRAM LIMITED: Weight: 3386.27 oz

## 2020-02-15 LAB — LACTATE DEHYDROGENASE: LDH: 411 U/L — ABNORMAL HIGH (ref 98–192)

## 2020-02-15 SURGERY — LEFT HEART CATH AND CORONARY ANGIOGRAPHY
Anesthesia: LOCAL

## 2020-02-15 MED ORDER — FENTANYL 2500MCG IN NS 250ML (10MCG/ML) PREMIX INFUSION
0.0000 ug/h | INTRAVENOUS | Status: DC
  Administered 2020-02-15: 75 ug/h via INTRAVENOUS
  Administered 2020-02-16: 125 ug/h via INTRAVENOUS
  Administered 2020-02-16: 175 ug/h via INTRAVENOUS
  Administered 2020-02-17: 200 ug/h via INTRAVENOUS
  Administered 2020-02-17: 175 ug/h via INTRAVENOUS
  Administered 2020-02-18: 200 ug/h via INTRAVENOUS
  Administered 2020-02-18: 300 ug/h via INTRAVENOUS
  Administered 2020-02-19: 400 ug/h via INTRAVENOUS
  Administered 2020-02-20: 100 ug/h via INTRAVENOUS
  Filled 2020-02-15 (×8): qty 250

## 2020-02-15 MED ORDER — TICAGRELOR 90 MG PO TABS: 180.0000 mg | ORAL_TABLET | Freq: Once | ORAL | Status: DC

## 2020-02-15 MED ORDER — ATORVASTATIN CALCIUM 80 MG PO TABS
80.0000 mg | ORAL_TABLET | Freq: Every day | ORAL | Status: DC
  Administered 2020-02-15 – 2020-02-16 (×2): 80 mg
  Filled 2020-02-15 (×3): qty 1

## 2020-02-15 MED ORDER — ONDANSETRON HCL 4 MG/2ML IJ SOLN
4.0000 mg | Freq: Four times a day (QID) | INTRAMUSCULAR | Status: DC | PRN
  Administered 2020-02-17: 4 mg via INTRAVENOUS
  Filled 2020-02-15: qty 2

## 2020-02-15 MED ORDER — SODIUM BICARBONATE 8.4 % IV SOLN
INTRAVENOUS | Status: DC | PRN
  Administered 2020-02-15: 50 meq via INTRAVENOUS

## 2020-02-15 MED ORDER — MIDAZOLAM BOLUS VIA INFUSION
1.0000 mg | INTRAVENOUS | Status: DC | PRN
  Administered 2020-02-19 (×2): 2 mg via INTRAVENOUS
  Administered 2020-02-20 (×2): 1 mg via INTRAVENOUS
  Administered 2020-02-20 (×2): 2 mg via INTRAVENOUS
  Administered 2020-02-20: 1 mg via INTRAVENOUS
  Administered 2020-02-20 – 2020-02-21 (×4): 2 mg via INTRAVENOUS
  Filled 2020-02-15: qty 2

## 2020-02-15 MED ORDER — POLYETHYLENE GLYCOL 3350 17 G PO PACK
17.0000 g | PACK | Freq: Every day | ORAL | Status: DC
  Administered 2020-02-16 – 2020-02-18 (×3): 17 g
  Filled 2020-02-15 (×3): qty 1

## 2020-02-15 MED ORDER — ATORVASTATIN CALCIUM 80 MG PO TABS
80.0000 mg | ORAL_TABLET | Freq: Every day | ORAL | Status: DC
  Filled 2020-02-15 (×2): qty 1

## 2020-02-15 MED ORDER — ROCURONIUM BROMIDE 50 MG/5ML IV SOLN
100.0000 mg | Freq: Once | INTRAVENOUS | Status: AC
  Administered 2020-02-15: 100 mg via INTRAVENOUS
  Filled 2020-02-15: qty 10

## 2020-02-15 MED ORDER — HEPARIN SODIUM (PORCINE) 5000 UNIT/ML IJ SOLN
INTRAVENOUS | Status: DC
  Filled 2020-02-15: qty 10

## 2020-02-15 MED ORDER — SODIUM CHLORIDE 0.9 % IV SOLN
250.0000 mL | INTRAVENOUS | Status: DC | PRN
  Administered 2020-02-21: 500 mL via INTRAVENOUS
  Administered 2020-02-23: 250 mL via INTRAVENOUS

## 2020-02-15 MED ORDER — CANGRELOR BOLUS VIA INFUSION
INTRAVENOUS | Status: DC | PRN
  Administered 2020-02-15: 2880 ug via INTRAVENOUS

## 2020-02-15 MED ORDER — EPINEPHRINE PF 1 MG/ML IJ SOLN
INTRAMUSCULAR | Status: DC | PRN
  Administered 2020-02-15 (×2): 1 mg via SUBCUTANEOUS

## 2020-02-15 MED ORDER — ROCURONIUM BROMIDE 50 MG/5ML IV SOLN
INTRAVENOUS | Status: AC | PRN
  Administered 2020-02-15: 50 mg via INTRAVENOUS

## 2020-02-15 MED ORDER — AMIODARONE HCL IN DEXTROSE 360-4.14 MG/200ML-% IV SOLN
INTRAVENOUS | Status: AC | PRN
  Administered 2020-02-15 (×2): 30 mg/h via INTRAVENOUS

## 2020-02-15 MED ORDER — HEPARIN (PORCINE) 25000 UT/250ML-% IV SOLN
400.0000 [IU]/h | INTRAVENOUS | Status: DC
  Administered 2020-02-15: 100 [IU]/h via INTRAVENOUS
  Filled 2020-02-15: qty 250

## 2020-02-15 MED ORDER — AMIODARONE HCL 150 MG/3ML IV SOLN
INTRAVENOUS | Status: DC | PRN
  Administered 2020-02-15 (×2): 150 mg via INTRAVENOUS

## 2020-02-15 MED ORDER — ASPIRIN 81 MG PO CHEW
81.0000 mg | CHEWABLE_TABLET | Freq: Every day | ORAL | Status: DC
  Administered 2020-02-16 – 2020-02-22 (×7): 81 mg
  Filled 2020-02-15 (×7): qty 1

## 2020-02-15 MED ORDER — ETOMIDATE 2 MG/ML IV SOLN
INTRAVENOUS | Status: AC | PRN
  Administered 2020-02-15: 20 mg via INTRAVENOUS

## 2020-02-15 MED ORDER — EPINEPHRINE 1 MG/10ML IJ SOSY
PREFILLED_SYRINGE | INTRAMUSCULAR | Status: AC | PRN
  Administered 2020-02-15: 1 via INTRAVENOUS

## 2020-02-15 MED ORDER — FENTANYL 2500MCG IN NS 250ML (10MCG/ML) PREMIX INFUSION
INTRAVENOUS | Status: AC
  Filled 2020-02-15: qty 250

## 2020-02-15 MED ORDER — FENTANYL CITRATE (PF) 100 MCG/2ML IJ SOLN
INTRAMUSCULAR | Status: DC | PRN
  Administered 2020-02-15: 50 ug via INTRAVENOUS

## 2020-02-15 MED ORDER — TICAGRELOR 90 MG PO TABS
90.0000 mg | ORAL_TABLET | Freq: Two times a day (BID) | ORAL | Status: DC
  Administered 2020-02-16 – 2020-02-17 (×3): 90 mg
  Filled 2020-02-15 (×4): qty 1

## 2020-02-15 MED ORDER — DEXTROSE 50 % IV SOLN: 0.0000 mL | INTRAVENOUS | Status: DC | PRN

## 2020-02-15 MED ORDER — FENTANYL CITRATE (PF) 100 MCG/2ML IJ SOLN
50.0000 ug | Freq: Once | INTRAMUSCULAR | Status: DC
Start: 2020-02-15 — End: 2020-02-23

## 2020-02-15 MED ORDER — MAGNESIUM SULFATE 2 GM/50ML IV SOLN: 2.0000 g | Freq: Once | INTRAVENOUS | Status: AC

## 2020-02-15 MED ORDER — NOREPINEPHRINE 16 MG/250ML-% IV SOLN
0.0000 ug/min | INTRAVENOUS | Status: DC
  Administered 2020-02-15: 40 ug/min via INTRAVENOUS
  Administered 2020-02-16: 45 ug/min via INTRAVENOUS
  Administered 2020-02-17: 17 ug/min via INTRAVENOUS
  Administered 2020-02-17: 11 ug/min via INTRAVENOUS
  Administered 2020-02-18: 5 ug/min via INTRAVENOUS
  Filled 2020-02-15 (×6): qty 250

## 2020-02-15 MED ORDER — HEPARIN SODIUM (PORCINE) 1000 UNIT/ML IJ SOLN
INTRAMUSCULAR | Status: DC | PRN
  Administered 2020-02-15: 5000 [IU] via INTRAVENOUS
  Administered 2020-02-15: 6000 [IU] via INTRAVENOUS
  Administered 2020-02-15: 3000 [IU] via INTRAVENOUS

## 2020-02-15 MED ORDER — NOREPINEPHRINE BITARTRATE 1 MG/ML IV SOLN
INTRAVENOUS | Status: AC | PRN
  Administered 2020-02-15: 10 ug/min via INTRAVENOUS

## 2020-02-15 MED ORDER — SODIUM CHLORIDE 0.9% IV SOLUTION: INTRAVENOUS | Status: DC | PRN

## 2020-02-15 MED ORDER — INSULIN ASPART 100 UNIT/ML ~~LOC~~ SOLN
0.0000 [IU] | SUBCUTANEOUS | Status: DC
  Administered 2020-02-15: 5 [IU] via SUBCUTANEOUS
  Administered 2020-02-15: 3 [IU] via SUBCUTANEOUS
  Administered 2020-02-15: 5 [IU] via SUBCUTANEOUS
  Administered 2020-02-16: 3 [IU] via SUBCUTANEOUS
  Administered 2020-02-16: 2 [IU] via SUBCUTANEOUS

## 2020-02-15 MED ORDER — SODIUM CHLORIDE 0.9% FLUSH: 3.0000 mL | INTRAVENOUS | Status: DC | PRN

## 2020-02-15 MED ORDER — TICAGRELOR 90 MG PO TABS: 90.0000 mg | ORAL_TABLET | Freq: Two times a day (BID) | ORAL | Status: DC

## 2020-02-15 MED ORDER — HEPARIN SODIUM (PORCINE) 5000 UNIT/ML IJ SOLN: 5000.0000 [IU] | Freq: Three times a day (TID) | INTRAMUSCULAR | Status: DC

## 2020-02-15 MED ORDER — DOCUSATE SODIUM 50 MG/5ML PO LIQD
100.0000 mg | Freq: Two times a day (BID) | ORAL | Status: DC
  Administered 2020-02-15 – 2020-02-18 (×7): 100 mg
  Filled 2020-02-15 (×7): qty 10

## 2020-02-15 MED ORDER — MIDAZOLAM 50MG/50ML (1MG/ML) PREMIX INFUSION
0.5000 mg/h | INTRAVENOUS | Status: DC
  Administered 2020-02-15: 4 mg/h via INTRAVENOUS
  Administered 2020-02-15: 2 mg/h via INTRAVENOUS
  Administered 2020-02-16: 5 mg/h via INTRAVENOUS
  Administered 2020-02-17: 1 mg/h via INTRAVENOUS
  Administered 2020-02-19: 0.5 mg/h via INTRAVENOUS
  Administered 2020-02-20 – 2020-02-21 (×2): 2 mg/h via INTRAVENOUS
  Filled 2020-02-15 (×11): qty 50

## 2020-02-15 MED ORDER — NITROGLYCERIN 1 MG/10 ML FOR IR/CATH LAB
INTRA_ARTERIAL | Status: AC
  Filled 2020-02-15: qty 10

## 2020-02-15 MED ORDER — POTASSIUM CHLORIDE CRYS ER 20 MEQ PO TBCR: 80.0000 meq | EXTENDED_RELEASE_TABLET | Freq: Once | ORAL | Status: DC

## 2020-02-15 MED ORDER — DEXTROSE IN LACTATED RINGERS 5 % IV SOLN: INTRAVENOUS | Status: DC

## 2020-02-15 MED ORDER — MIDAZOLAM HCL 2 MG/2ML IJ SOLN
INTRAMUSCULAR | Status: DC | PRN
  Administered 2020-02-15: 2 mg via INTRAVENOUS
  Administered 2020-02-15: 1 mg via INTRAVENOUS

## 2020-02-15 MED ORDER — EPINEPHRINE HCL 5 MG/250ML IV SOLN IN NS
0.5000 ug/min | INTRAVENOUS | Status: DC
  Administered 2020-02-15: 1 ug/min via INTRAVENOUS
  Filled 2020-02-15: qty 250

## 2020-02-15 MED ORDER — INSULIN REGULAR(HUMAN) IN NACL 100-0.9 UT/100ML-% IV SOLN
INTRAVENOUS | Status: DC
  Filled 2020-02-15: qty 100

## 2020-02-15 MED ORDER — POTASSIUM CHLORIDE 20 MEQ PO PACK: 80.0000 meq | PACK | Freq: Once | ORAL | Status: DC

## 2020-02-15 MED ORDER — MILRINONE LACTATE IN DEXTROSE 20-5 MG/100ML-% IV SOLN
0.1250 ug/kg/min | INTRAVENOUS | Status: DC
  Administered 2020-02-15 – 2020-02-16 (×2): 0.25 ug/kg/min via INTRAVENOUS
  Filled 2020-02-15 (×2): qty 100

## 2020-02-15 MED ORDER — SODIUM CHLORIDE 0.9% FLUSH
3.0000 mL | Freq: Two times a day (BID) | INTRAVENOUS | Status: DC
  Administered 2020-02-15 – 2020-02-24 (×13): 3 mL via INTRAVENOUS

## 2020-02-15 MED ORDER — ATROPINE SULFATE 1 MG/10ML IJ SOSY
PREFILLED_SYRINGE | INTRAMUSCULAR | Status: DC | PRN
  Administered 2020-02-15: 1 mg via INTRAVENOUS

## 2020-02-15 MED ORDER — FENTANYL BOLUS VIA INFUSION
50.0000 ug | INTRAVENOUS | Status: DC | PRN
  Administered 2020-02-20 – 2020-02-21 (×11): 50 ug via INTRAVENOUS
  Filled 2020-02-15: qty 50

## 2020-02-15 MED ORDER — IOHEXOL 350 MG/ML SOLN
INTRAVENOUS | Status: DC | PRN
  Administered 2020-02-15: 200 mL

## 2020-02-15 MED ORDER — MAGNESIUM SULFATE 2 GM/50ML IV SOLN
INTRAVENOUS | Status: AC
  Administered 2020-02-15: 2 g via INTRAVENOUS
  Filled 2020-02-15: qty 50

## 2020-02-15 MED ORDER — ROCURONIUM BROMIDE 10 MG/ML (PF) SYRINGE
PREFILLED_SYRINGE | INTRAVENOUS | Status: AC
  Filled 2020-02-15: qty 10

## 2020-02-15 MED ORDER — SODIUM CHLORIDE 0.9 % IV SOLN
INTRAVENOUS | Status: AC | PRN
  Administered 2020-02-15: 4 ug/kg/min via INTRAVENOUS

## 2020-02-15 MED ORDER — TICAGRELOR 90 MG PO TABS
180.0000 mg | ORAL_TABLET | Freq: Once | ORAL | Status: AC
  Administered 2020-02-15: 180 mg
  Filled 2020-02-15: qty 2

## 2020-02-15 MED ORDER — ALBUMIN HUMAN 5 % IV SOLN
12.5000 g | Freq: Once | INTRAVENOUS | Status: AC
  Administered 2020-02-15: 12.5 g via INTRAVENOUS
  Filled 2020-02-15: qty 250

## 2020-02-15 MED ORDER — POTASSIUM CHLORIDE 10 MEQ/100ML IV SOLN
10.0000 meq | INTRAVENOUS | Status: AC
  Administered 2020-02-15 (×3): 10 meq via INTRAVENOUS
  Filled 2020-02-15 (×3): qty 100

## 2020-02-15 MED ORDER — CHLORHEXIDINE GLUCONATE 0.12% ORAL RINSE (MEDLINE KIT)
15.0000 mL | Freq: Two times a day (BID) | OROMUCOSAL | Status: DC
  Administered 2020-02-15 – 2020-02-22 (×13): 15 mL via OROMUCOSAL

## 2020-02-15 MED ORDER — PANTOPRAZOLE SODIUM 40 MG IV SOLR
40.0000 mg | Freq: Every day | INTRAVENOUS | Status: DC
  Administered 2020-02-15 – 2020-02-21 (×7): 40 mg via INTRAVENOUS
  Filled 2020-02-15 (×7): qty 40

## 2020-02-15 MED ORDER — POTASSIUM CHLORIDE 20 MEQ PO PACK
40.0000 meq | PACK | ORAL | Status: AC
  Administered 2020-02-15 (×2): 40 meq
  Filled 2020-02-15 (×2): qty 2

## 2020-02-15 MED ORDER — LACTATED RINGERS IV SOLN: INTRAVENOUS | Status: DC

## 2020-02-15 MED ORDER — AMIODARONE HCL IN DEXTROSE 360-4.14 MG/200ML-% IV SOLN
60.0000 mg/h | INTRAVENOUS | Status: AC
  Filled 2020-02-15: qty 200

## 2020-02-15 MED ORDER — LIDOCAINE HCL (PF) 1 % IJ SOLN
INTRAMUSCULAR | Status: DC | PRN
  Administered 2020-02-15: 3 mL
  Administered 2020-02-15: 5 mL
  Administered 2020-02-15: 15 mL

## 2020-02-15 MED ORDER — HEPARIN (PORCINE) IN NACL 1000-0.9 UT/500ML-% IV SOLN
INTRAVENOUS | Status: DC | PRN
  Administered 2020-02-15: 500 mL

## 2020-02-15 MED ORDER — VASOPRESSIN 20 UNITS/100 ML INFUSION FOR SHOCK
0.0300 [IU]/min | INTRAVENOUS | Status: DC
  Administered 2020-02-15 – 2020-02-18 (×6): 0.03 [IU]/min via INTRAVENOUS
  Filled 2020-02-15 (×6): qty 100

## 2020-02-15 MED ORDER — AMIODARONE HCL IN DEXTROSE 360-4.14 MG/200ML-% IV SOLN
30.0000 mg/h | INTRAVENOUS | Status: DC
  Administered 2020-02-15 – 2020-02-19 (×10): 30 mg/h via INTRAVENOUS
  Filled 2020-02-15 (×9): qty 200

## 2020-02-15 MED ORDER — ORAL CARE MOUTH RINSE
15.0000 mL | OROMUCOSAL | Status: DC
  Administered 2020-02-15 – 2020-02-22 (×67): 15 mL via OROMUCOSAL

## 2020-02-15 MED ORDER — NOREPINEPHRINE 4 MG/250ML-% IV SOLN
0.0000 ug/min | INTRAVENOUS | Status: DC
  Administered 2020-02-15: 22 ug/min via INTRAVENOUS
  Filled 2020-02-15 (×2): qty 250

## 2020-02-15 MED ORDER — SODIUM CHLORIDE 0.9 % IV SOLN
4.0000 ug/kg/min | INTRAVENOUS | Status: AC
  Administered 2020-02-15 (×2): 4 ug/kg/min via INTRAVENOUS
  Filled 2020-02-15 (×7): qty 50

## 2020-02-15 MED ORDER — ASPIRIN 81 MG PO CHEW: 81.0000 mg | CHEWABLE_TABLET | Freq: Every day | ORAL | Status: DC

## 2020-02-15 MED ORDER — CALCIUM GLUCONATE-NACL 1-0.675 GM/50ML-% IV SOLN
1.0000 g | Freq: Once | INTRAVENOUS | Status: AC
  Administered 2020-02-15: 1000 mg via INTRAVENOUS
  Filled 2020-02-15: qty 50

## 2020-02-15 SURGICAL SUPPLY — 29 items
BALLN SAPPHIRE 2.5X12 (BALLOONS) ×2
BALLN SAPPHIRE ~~LOC~~ 3.5X18 (BALLOONS) ×2 IMPLANT
BALLOON SAPPHIRE 2.5X12 (BALLOONS) ×1 IMPLANT
CATH INFINITI 5 FR JL3.5 (CATHETERS) ×2 IMPLANT
CATH INFINITI 5FR ANG PIGTAIL (CATHETERS) ×2 IMPLANT
CATH INFINITI 5FR JK (CATHETERS) ×2 IMPLANT
CATH SWAN GANZ VIP 7.5F (CATHETERS) ×2 IMPLANT
CATH VISTA GUIDE 6FR JL3.5 (CATHETERS) ×2 IMPLANT
CATH VISTA GUIDE 6FR JL4 (CATHETERS) ×2 IMPLANT
DEVICE RAD COMP TR BAND LRG (VASCULAR PRODUCTS) ×2 IMPLANT
FEM STOP ARCH (HEMOSTASIS) ×2
GLIDESHEATH SLEND SS 6F .021 (SHEATH) ×2 IMPLANT
GUIDEWIRE INQWIRE 1.5J.035X260 (WIRE) ×1 IMPLANT
INQWIRE 1.5J .035X260CM (WIRE) ×2
KIT ENCORE 26 ADVANTAGE (KITS) ×2 IMPLANT
KIT HEART LEFT (KITS) ×2 IMPLANT
KIT MICROPUNCTURE NIT STIFF (SHEATH) ×4 IMPLANT
MAT PREVALON FULL STRYKER (MISCELLANEOUS) ×2 IMPLANT
PACK CARDIAC CATHETERIZATION (CUSTOM PROCEDURE TRAY) ×2 IMPLANT
SET IMPELLA CP PUMP (CATHETERS) ×2 IMPLANT
SHEATH PINNACLE 8F 10CM (SHEATH) ×2 IMPLANT
SHEATH PROBE COVER 6X72 (BAG) ×4 IMPLANT
SLEEVE REPOSITIONING LENGTH 30 (MISCELLANEOUS) ×2 IMPLANT
STENT RESOLUTE ONYX 3.0X26 (Permanent Stent) ×2 IMPLANT
SYSTEM COMPRESSION FEMOSTOP (HEMOSTASIS) ×1 IMPLANT
TRANSDUCER W/STOPCOCK (MISCELLANEOUS) ×2 IMPLANT
TUBING CIL FLEX 10 FLL-RA (TUBING) ×2 IMPLANT
WIRE HI TORQ VERSACORE J 260CM (WIRE) ×2 IMPLANT
WIRE RUNTHROUGH .014X180CM (WIRE) ×2 IMPLANT

## 2020-02-15 NOTE — H&P (Signed)
Cardiology Admission History and Physical:   Patient ID: Jeffrey Morse MRN: [id number]; DOB: 1955/04/23   Admission date: 02/15/2020  Primary Care Provider: No primary care provider on file. [facility] Cardiologist: No primary care provider on file.  [facility] Electrophysiologist:  None   Chief Complaint: Chest pain followed by cardiac arrest  Patient Profile:   Jeffrey Morse is a 65 y.o. male with history of essential hypertension hyperlipidemia who presented with chest pain followed by cardiac arrest.  History of Present Illness:   Jeffrey Morse is a 65 year old male with no prior cardiac history.  He started having chest pain today and went to urgent care.  Before making it inside, he collapsed and CPR was initiated by family.  When EMS arrived, he was in ventricular fibrillation and was shocked successfully.  CPR was done for about 15 minutes before ROSC.  He had a King airway.  While in the ambulance, he lost his pulse again and went into PEA.  CPR was resumed and he arrived to the ED while EMS doing CPR.  Pulse check revealed that he had a pulse and a rhythm.  The patient was intubated and shortly after that he went back into PEA.  CPR was done for about 5 minutes and achieved ROSC again.  The patient was hypotensive but improved with epinephrine.  I did a quick bedside echocardiogram which showed severely reduced LV systolic function and no evidence of pericardial effusion.  EKG in between CPR rounds showed evidence of inferior ST elevation with lateral involvement suggestive left circumflex occlusion. I felt that patient's best option would be to proceed with emergent cardiac catheterization likely support device placement.  Family members were not available at this point and given the critical nature of the presentation, I proceeded directly to the Cath Lab.   No past medical history on file.     Medications Prior to Admission: Prior to Admission medications   Not  on File     Allergies:   Not on File  Social History:   Social History   Socioeconomic History  . Marital status: Married    Spouse name: Not on file  . Number of children: Not on file  . Years of education: Not on file  . Highest education level: Not on file  Occupational History  . Not on file  Tobacco Use  . Smoking status: Not on file  . Smokeless tobacco: Not on file  Substance and Sexual Activity  . Alcohol use: Not on file  . Drug use: Not on file  . Sexual activity: Not on file  Other Topics Concern  . Not on file  Social History Narrative  . Not on file   Social Determinants of Health   Financial Resource Strain: Not on file  Food Insecurity: Not on file  Transportation Needs: Not on file  Physical Activity: Not on file  Stress: Not on file  Social Connections: Not on file  Intimate Partner Violence: Not on file    Family History:   Could not obtain family history as the patient was intubated and not responsive.  ROS:  Review of system was not possible as the patient was intubated and not responsive  Physical Exam/Data:   Vitals:   02/15/20 1126 02/15/20 1437  SpO2: (!) 81% (!) 88%  Weight:  96 kg    Intake/Output Summary (Last 24 hours) at 02/15/2020 1635 Last data filed at 02/15/2020 1600 Gross per 24 hour  Intake 50.4 ml  Output --  Net 50.4 ml   Last 3 Weights 02/15/2020  Weight (lbs) 211 lb 10.3 oz  Weight (kg) 96 kg     There is no height or weight on file to calculate BMI.  General: Critically ill-appearing patient who is cold and clammy HEENT: normal Lymph: no adenopathy Neck: Jugular venous pressure is not well visualized Endocrine:  No thryomegaly Vascular: No carotid bruits; FA pulses 2+ bilaterally without bruits  Cardiac:  normal S1, S2; RRR; no murmur  Lungs:  clear to auscultation bilaterally, no wheezing, rhonchi or rales  Abd: soft, nontender, no hepatomegaly  Ext: no edema Skin: Cold and clammy Neuro: Nonfocal. Psych:  Not able to evaluate   EKG:  The ECG that was done  was personally reviewed and demonstrates sinus tachycardia with inferior lateral ST elevation.  Relevant CV Studies:   Laboratory Data:  High Sensitivity Troponin:   Recent Labs  Lab 02/15/20 1114  TROPONINIHS 195*      Chemistry Recent Labs  Lab 02/15/20 1114 02/15/20 1216 02/15/20 1333  NA 136 139 140  K 2.9* 2.8* 2.8*  CL 104  --   --   CO2 15*  --   --   GLUCOSE 324*  --   --   BUN 19  --   --   CREATININE 1.26*  --   --   CALCIUM 8.7*  --   --   GFRNONAA >60  --   --   ANIONGAP 17*  --   --     Recent Labs  Lab 02/15/20 1114  PROT 6.1*  ALBUMIN 3.5  AST 73*  ALT 54*  ALKPHOS 91  BILITOT 0.8   Hematology Recent Labs  Lab 02/15/20 1114 02/15/20 1216 02/15/20 1333  WBC 14.2*  --   --   RBC 5.04  --   --   HGB 14.4 14.6 15.0  HCT 45.7 43.0 44.0  MCV 90.7  --   --   MCH 28.6  --   --   MCHC 31.5  --   --   RDW 13.1  --   --   PLT 280  --   --    BNPNo results for input(s): BNP, PROBNP in the last 168 hours.  DDimer No results for input(s): DDIMER in the last 168 hours.   Radiology/Studies:  CARDIAC CATHETERIZATION  Result Date: 02/15/2020  There is severe left ventricular systolic dysfunction.  LV end diastolic pressure is moderately elevated.  The left ventricular ejection fraction is 25-35% by visual estimate.  Ost LAD lesion is 40% stenosed.  Dist LAD lesion is 90% stenosed.  1st Mrg lesion is 90% stenosed.  Ost Cx lesion is 40% stenosed.  Prox Cx lesion is 100% stenosed.  Post intervention, there is a 0% residual stenosis.  A drug-eluting stent was successfully placed using a STENT RESOLUTE ONYX 3.0X26.  1.  Out of hospital cardiac arrest and cardiogenic shock due to occluded mid left circumflex.  The left circumflex and LAD has 2 separate ostia.  There is moderate ostial LAD disease and significant disease in the distal LAD close to the apex.  No significant disease affecting the right  coronary artery. 2.  Severely reduced LV systolic function with an EF of 25%. 3.  Moderately to severely elevated left ventricular end-diastolic pressure at the beginning of the case with an LVEDP of 28 mmHg. 4.  Successful Impella device placement via the right common femoral artery for cardiogenic shock. 5.  Successful angioplasty and drug-eluting stent placement to the left circumflex 6.  Successful right heart catheterization via the right internal jugular vein.  Right heart catheterization at end of the case showed mildly elevated filling pressures, mild pulmonary hypertension and normal cardiac output.  Recommendations: The patient was started on cangrelor drip which will be continued for 4 hours after loading dose of Brilinta is given. Continue heparin drip for Impella Further management of cardiogenic shock per Dr. Gala Romney The patient will require dual antiplatelet therapy for at least 12 months.   Portable Chest x-ray  Result Date: 02/15/2020 CLINICAL DATA:  Endotracheal tube and Swan-Ganz catheter placement. EXAM: PORTABLE CHEST 1 VIEW COMPARISON:  None. FINDINGS: Endotracheal tube tip 4.4 cm from the carina at the level of the clavicular heads. Right internal jugular Swan-Ganz catheter tip in the region of the main pulmonary outflow tract. Enteric tube is in place with tip below the diaphragm not included in the field of view. Impella device in place. Heart is normal in size with normal mediastinal contours. Vascular congestion. Retrocardiac left lung base atelectasis. Ill-defined patchy opacity in the right upper lobe. No pneumothorax or large pleural effusion. IMPRESSION: 1. Endotracheal tube tip 4.4 cm from the carina at the level of the clavicular heads. Swan-Ganz catheter tip in the region of the main pulmonary outflow tract. Impella device in place. 2. Vascular congestion. Ill-defined patchy opacity in the right upper lobe may be atelectasis, pneumonia, or asymmetric pulmonary edema.  Electronically Signed   By: Narda Rutherford M.D.   On: 02/15/2020 16:14   DG Abd Portable 1V  Result Date: 02/15/2020 CLINICAL DATA:  OG tube placement. EXAM: PORTABLE ABDOMEN - 1 VIEW COMPARISON:  None. FINDINGS: Tip and side port of the enteric tube below the diaphragm in the stomach. Nonobstructive bowel gas pattern in the upper abdomen. Excreted IV contrast in the renal collecting systems. IMPRESSION: Tip and side port of the enteric tube below the diaphragm in the stomach. Electronically Signed   By: Narda Rutherford M.D.   On: 02/15/2020 16:15   ECHOCARDIOGRAM LIMITED  Result Date: 02/15/2020    ECHOCARDIOGRAM LIMITED REPORT   Patient Name:   Jeffrey Morse Date of Exam: 02/15/2020 Medical Rec #:  [id number]        Height:       70.0 in Accession #:    [id number]       Weight:       211.6 lb Date of Birth:  [birth year]        BSA:          2.138 m Patient Age:    64 years         BP:           107/92 mmHg Patient Gender: M                HR:           62 bpm. Exam Location:  Inpatient Procedure: Limited Echo and Limited Color Doppler Indications:    Cardiac Arrest I46.9  History:        Patient has no prior history of Echocardiogram examinations.  Sonographer:    Tiffany Dance Referring Phys: [id number] A Wray Goehring  Sonographer Comments: Technically difficult study due to poor echo windows and echo performed with patient supine and on artificial respirator. IMPRESSIONS  1. Non diagnostic only para sternal images. Impella device suitable distance distal to AV annulus No pericardial effusion Posterior lateral wall appears hypokinetic No color flow  or doppler performed. FINDINGS  Additional Comments: Non diagnostic only para sternal images. Impella device suitable distance distal to AV annulus No pericardial effusion Posterior lateral wall appears hypokinetic No color flow or doppler performed. Charlton Haws MD Electronically signed by Charlton Haws MD Signature Date/Time: 02/15/2020/4:05:49 PM    Final       Assessment and Plan:   1. Out of hospital cardiac arrest with cardiogenic shock due to inferior ST elevation myocardial infarction complicated by ventricular fibrillation.  I proceeded with emergent cardiac catheterization via the right radial artery which showed an occluded mid left circumflex which was the culprit.  Given that the patient was in shock, I elected to place an Impella support device via the right common femoral artery.  I then performed successful PCI and drug-eluting stent placement to the left circumflex.  I started the patient on cangrelor which will be continued until he is given Brilinta via an NG tube.  Continue dual antiplatelet therapy for at least 12 months.  The patient does not require any additional revascularization.  A Swan-Ganz catheter was placed via the right internal jugular vein was left in place to guide treatment.  The case was discussed with Dr. Gala Romney who was present and assisted in managing the patient's critical issues.  Continue heparin drip per protocol for Impella device.  I updated the family about the patient's critical condition. 2. Essential hypertension: Holding antihypertensive medications due to shock. 3. Hyperlipidemia: Started high-dose atorvastatin.   TIMI Risk Score for ST  Elevation MI:   The patient's TIMI risk score is 8, which indicates a 26.8% risk of all cause mortality at 30 days.  I estimate that risk of all cause mortality is higher than 27% given cardiogenic shock, out of hospital cardiac arrest and prolonged CPR time.   Severity of Illness: The appropriate patient status for this patient is INPATIENT. Inpatient status is judged to be reasonable and necessary in order to provide the required intensity of service to ensure the patient's safety. The patient's presenting symptoms, physical exam findings, and initial radiographic and laboratory data in the context of their chronic comorbidities is felt to place them at high risk for  further clinical deterioration. Furthermore, it is not anticipated that the patient will be medically stable for discharge from the hospital within 2 midnights of admission. The following factors support the patient status of inpatient.   " Presentation with inferior STEMI and cardiogenic shock.   * I certify that at the point of admission it is my clinical judgment that the patient will require inpatient hospital care spanning beyond 2 midnights from the point of admission due to high intensity of service, high risk for further deterioration and high frequency of surveillance required.*    For questions or updates, please contact [facility] Please consult [email or web address] for contact info under     Signed, Lorine Bears, MD  02/15/2020 4:35 PM

## 2020-02-15 NOTE — Progress Notes (Signed)
  Echocardiogram 2D Echocardiogram has been performed.  Jeffrey Morse Boxer 02/15/2020, 4:05 PM

## 2020-02-15 NOTE — Progress Notes (Signed)
Per Dr. Kirke Corin RN to give loading dose of Brilinta and d/c Cangrelor 4 hours later. May begin removing fem stop once off Cangrelor.

## 2020-02-15 NOTE — ED Notes (Signed)
Pt to cath lab.

## 2020-02-15 NOTE — Consult Note (Addendum)
NAME:  Jeffrey Morse, MRN:  [id number], DOB:  07/23/1955, LOS: 0 ADMISSION DATE:  02/15/2020, CONSULTATION DATE:  02/15/20 REFERRING MD:  Junious Dresser  CHIEF COMPLAINT:  VF arrest   Brief History   Jeffrey Morse is a 65 y.o. male who was brought to [facility] ED 1/12 with VT arrest after he collapsed at [facility] (had gone there for chest pain).  He had multiple recurrent arrests before being taken to cath lab where he was found to have severe LV dysfunction with EF 25-35%, and 100% prox Cx stenosis.  He had DES and Impella placed and was then transferred to the ICU where PCCM was asked to assist with vent management.  History of present illness   Pt is encephelopathic; therefore, this HPI is obtained from chart review. Jeffrey Morse is a 65 y.o. male who has unknown PMH.  He was going to [facility] 1/12 for chest pain and before he got inside, he collapsed and went unresponsive.  Family was with him and began CPR while awaiting EMS.  On EMS arrival, he was found to have VT.  He was shocked once and had ROSC; however, he proceeded to have multiple recurrent arrests.  He was brought to [facility] and taken straight to cath lab where he was found to have severe LV dysfunction EF 25 - 35%, Ost LAD 40% stenosed, Dist LAD 90% stenosed, 1st Marg lesion 90% stenosed, Ost Cx 40% stenosed, Prox Cx 100% stenosed.   He had DES and Impella placed and was transferred to the ICU where PCCM was asked to assist with vent management.  He is currently on 41mcg/min levophed.  Past Medical History  Unknown. has Cardiogenic shock (HCC); Cardiac arrest [facility]); and ST elevation myocardial infarction (STEMI) (HCC) on their problem list.  Significant Hospital Events   1/12 > admit.  Consults:  Cards, PCCM.  Procedures:  ETT 1/12 >   Significant Diagnostic Tests:  Cath lab 1/12 > Severe LV dysfunction EF 25 - 35%, Ost LAD 40% stenosed, Dist LAD 90% stenosed, 1st Marg lesion 90% stenosed, Ost Cx 40% stenosed, Prox Cx 100% stenosed.  DES placed,  Impella placed. Echo 1/12 >   Micro Data:  Flu 1/12 > negative. COVID 1/12 > negative.  Antimicrobials:  None.   Interim history/subjective:  Sedated, not responsive.  Objective:  Weight 96 kg, SpO2 (!) 88 %.    Vent Mode: PRVC FiO2 (%):  [100 %] 100 % Set Rate:  [20 bmp-24 bmp] 24 bmp Vt Set:  [560 mL] 560 mL PEEP:  [10 cmH20-12 cmH20] 12 cmH20 Plateau Pressure:  [22 cmH20] 22 cmH20   Intake/Output Summary (Last 24 hours) at 02/15/2020 1527 Last data filed at 02/15/2020 1313 Gross per 24 hour  Intake 17.7 ml  Output --  Net 17.7 ml   Filed Weights   02/15/20 1437  Weight: 96 kg    Examination: General: Adult male, resting in bed, in NAD. Neuro: Sedate, not responsive. HEENT: [place]/AT. Sclerae anicteric. ETT in place. Cardiovascular: RRR, no M/R/G.  Lungs: Respirations even and unlabored.  Coarse bilaterally. Abdomen: BS hypoactive, soft, NT/ND.  Musculoskeletal: No gross deformities, no edema.  Skin: Intact, warm, no rashes.   Assessment & Plan:   VT cardiac arrest - s/p cath lab with findings of 100% stenosed prox Cx s/p DES and Impella due to severely depressed LVEF (25 - 35%). Cardiogenic Shock. - Cards and heart failure teams / managing. - Normothermia protocol / avoid fevers. - Continue norepinephrine, amiodarone,  Cangerlor, Ticagrelor. - Heparin purge via Impella. - Echo.  Hypokalemia. - 80 mEq K.  - 4 additional runs K given anticipated further drop after insulin gtt started. - Follow BMP.  ? DKA. - Insulin gtt per protocol. - Check beta hyroxybutyric acid.  Transaminitis - presumed 2/2 cardiac arrest. - Trend. - Caution with Amiodarone.   Best Practice (evaluated daily):  Diet: NPO. Pain/Anxiety/Delirium protocol (if indicated): Fentanyl gtt / Midazolam gtt.  RASS goal -1. VAP protocol (if indicated): In place. DVT prophylaxis: SCD's. GI prophylaxis: PPI. Glucose control: Insulin gtt. Mobility: Bedrest. Disposition: ICU.  Goals of  Care:  Last date of multidisciplinary goals of care discussion: None.  Family and staff present: None. Summary of discussion: None. Follow up goals of care discussion due: 1/18. Code Status:  Full.  Labs   CBC: Recent Labs  Lab 02/15/20 1114 02/15/20 1216 02/15/20 1333  WBC 14.2*  --   --   NEUTROABS 7.8*  --   --   HGB 14.4 14.6 15.0  HCT 45.7 43.0 44.0  MCV 90.7  --   --   PLT 280  --   --    Basic Metabolic Panel: Recent Labs  Lab 02/15/20 1114 02/15/20 1216 02/15/20 1333  NA 136 139 140  K 2.9* 2.8* 2.8*  CL 104  --   --   CO2 15*  --   --   GLUCOSE 324*  --   --   BUN 19  --   --   CREATININE 1.26*  --   --   CALCIUM 8.7*  --   --    GFR: CrCl cannot be calculated (Unknown ideal weight.). Recent Labs  Lab 02/15/20 1114  WBC 14.2*   Liver Function Tests: Recent Labs  Lab 02/15/20 1114  AST 73*  ALT 54*  ALKPHOS 91  BILITOT 0.8  PROT 6.1*  ALBUMIN 3.5   No results for input(s): LIPASE, AMYLASE in the last 168 hours. No results for input(s): AMMONIA in the last 168 hours. ABG    Component Value Date/Time   PHART 7.268 (L) 02/15/2020 1216   PCO2ART 48.9 (H) 02/15/2020 1216   PO2ART 58 (L) 02/15/2020 1216   HCO3 24.0 02/15/2020 1333   TCO2 25 02/15/2020 1333   ACIDBASEDEF 3.0 (H) 02/15/2020 1333   O2SAT 68.0 02/15/2020 1333    Coagulation Profile: Recent Labs  Lab 02/15/20 1114  INR 1.1   Cardiac Enzymes: No results for input(s): CKTOTAL, CKMB, CKMBINDEX, TROPONINI in the last 168 hours. HbA1C: Hgb A1c MFr Bld  Date/Time Value Ref Range Status  02/15/2020 11:14 AM 6.0 (H) 4.8 - 5.6 % Final    Comment:    (NOTE) Pre diabetes:          5.7%-6.4%  Diabetes:              >6.4%  Glycemic control for   <7.0% adults with diabetes    CBG: No results for input(s): GLUCAP in the last 168 hours.  Review of Systems:   Unable to obtain as pt is encephalopathic.  Past medical history  He,  has no past medical history on file.    Surgical History   Not available.  Social History      Family history   His family history is not on file.   Allergies Not on File   Home meds  Prior to Admission medications   Not on File    Critical care time: 40 min.  Rutherford Guys, [place] [facility] For pager details, please see AMION 02/15/2020, 3:27 PM

## 2020-02-15 NOTE — Progress Notes (Signed)
Patient w/ frequent suction alarms on impella. Suction events correlate w/ PVCS and coughing.    Began having suction events when not having coughing episodes or ectopy. Spoke with Dr. Gala Romney who instructed RN to reduce to p6.    Continued to have suction. Dr.Bensimhon to check placement at bedside. Nikki impella rep instructed RN to change to p5- suction resolved.

## 2020-02-15 NOTE — Consult Note (Addendum)
Advanced Heart Failure Team Consult Note   Referring: Dr. Kirke Corin  Reason for Consultation: Cardiogenic shock  HPI:    Jeffrey Morse is seen today for evaluation of cardiac arrest/cardiogenic shock at the request of Dr. Kirke Corin.   Jeffrey Morse is a 65 year old male with no prior cardiac history.  He started having chest pain today and went to urgent care.  Before making it inside, he collapsed and CPR was initiated by family.  When EMS arrived, he was in ventricular fibrillation and was shocked successfully.  CPR was done for about 15 minutes before ROSC.  He had a King airway.  While in the ambulance, he lost his pulse again and went into PEA.  CPR was resumed and he arrived to the ED while EMS doing CPR.  Pulse check revealed that he had a pulse and a rhythm.  The patient was intubated and shortly after that he went back into PEA.  CPR was done for about 5 minutes and achieved ROSC again.  The patient was hypotensive but improved with epinephrine. Seen by Dr. Kirke Corin. Echo with severe LV dysfunction.   EKG in between CPR rounds showed evidence of inferior ST elevation with lateral involvement suggestive left circumflex occlusion.  Taken to cath lab emergently  I was called by Dr. Kirke Corin to help manage cardiogenic shock in the cath lab. 65 yo male with no h/o heart disease presented with cardiac arrest in setting of acute MI  On my arrival to cath lab. Patient intubated and sedated. Cath showed totally occluded LCX  Patient very unstable on NE. Hving frequent VT requiring amio and multiple shocks  Impella placed. Stabilized with IV amio and lidocaine. Required several rounds of epi and bicarb.   After impella placement LCx opened with PCI/DES. Swan placed. Inotropes adjusted. Developed RFA hematoma and I held pressure and device secured. Fem stop placed.   Brought to CCU. Impella repositioned with echo guidance. CI 1.3.     Review of Systems: [y] = yes, [ ]  = no: unavailable due to  intubation    Past Medical History: 1. HTN  Family History: No FHX of HF or premature CAD,  Social History: Social History   Socioeconomic History  . Marital status: Married    Spouse name: Not on file  . Number of children: Not on file  . Years of education: Not on file  . Highest education level: Not on file  Occupational History  . Not on file  Tobacco Use  . Smoking status: Not on file  . Smokeless tobacco: Not on file  Substance and Sexual Activity  . Alcohol use: Not on file  . Drug use: Not on file  . Sexual activity: Not on file  Other Topics Concern  . Not on file  Social History Narrative  . Not on file   Social Determinants of Health   Financial Resource Strain: Not on file  Food Insecurity: Not on file  Transportation Needs: Not on file  Physical Activity: Not on file  Stress: Not on file  Social Connections: Not on file    Allergies:  Not on File  Objective:    Vital Signs:   SpO2:  [81 %-88 %] 88 % (01/12 1437) FiO2 (%):  [100 %] 100 % (01/12 1437) Weight:  [96 kg] 96 kg (01/12 1437)    Weight change: Filed Weights   02/15/20 1437  Weight: 96 kg    Intake/Output:   Intake/Output Summary (Last 24 hours)  at 02/15/2020 1705 Last data filed at 02/15/2020 1600 Gross per 24 hour  Intake 50.4 ml  Output -  Net 50.4 ml      Physical Exam    General: Intubated sedated HEENT: normal +ETT Neck: supple. RIJ swan Carotids 2+ bilat; no bruits. No lymphadenopathy or thyromegaly appreciated. Cor: PMI nondisplaced. Regular rate & rhythm. No rubs, gallops or murmurs. Lungs: coarse Abdomen: soft, nontender, nondistended. No hepatosplenomegaly. No bruits or masses. Good bowel sounds. Extremities: no cyanosis, clubbing, rash, edema RFA Impella Neuro: sedated   Telemetry   NSR with frequent NSVT and VT  Labs   Basic Metabolic Panel: Recent Labs  Lab 02/15/20 1114 02/15/20 1216 02/15/20 1333 02/15/20 1523  NA 136 139 140 138  K 2.9*  2.8* 2.8* 3.2*  CL 104  --   --  103  CO2 15*  --   --  22  GLUCOSE 324*  --   --  333*  BUN 19  --   --  19  CREATININE 1.26*  --   --  1.08  CALCIUM 8.7*  --   --  8.1*  MG  --   --   --  1.7    Liver Function Tests: Recent Labs  Lab 02/15/20 1114  AST 73*  ALT 54*  ALKPHOS 91  BILITOT 0.8  PROT 6.1*  ALBUMIN 3.5   No results for input(s): LIPASE, AMYLASE in the last 168 hours. No results for input(s): AMMONIA in the last 168 hours.  CBC: Recent Labs  Lab 02/15/20 1114 02/15/20 1216 02/15/20 1333  WBC 14.2*  --   --   NEUTROABS 7.8*  --   --   HGB 14.4 14.6 15.0  HCT 45.7 43.0 44.0  MCV 90.7  --   --   PLT 280  --   --     Cardiac Enzymes: No results for input(s): CKTOTAL, CKMB, CKMBINDEX, TROPONINI in the last 168 hours.  BNP: BNP (last 3 results) No results for input(s): BNP in the last 8760 hours.  ProBNP (last 3 results) No results for input(s): PROBNP in the last 8760 hours.   CBG: Recent Labs  Lab 02/15/20 1605  GLUCAP 290*    Coagulation Studies: Recent Labs    02/15/20 1114  LABPROT 14.0  INR 1.1     Imaging   CARDIAC CATHETERIZATION  Result Date: 02/15/2020  There is severe left ventricular systolic dysfunction.  LV end diastolic pressure is moderately elevated.  The left ventricular ejection fraction is 25-35% by visual estimate.  Ost LAD lesion is 40% stenosed.  Dist LAD lesion is 90% stenosed.  1st Mrg lesion is 90% stenosed.  Ost Cx lesion is 40% stenosed.  Prox Cx lesion is 100% stenosed.  Post intervention, there is a 0% residual stenosis.  A drug-eluting stent was successfully placed using a STENT RESOLUTE ONYX 3.0X26.  1.  Out of hospital cardiac arrest and cardiogenic shock due to occluded mid left circumflex.  The left circumflex and LAD has 2 separate ostia.  There is moderate ostial LAD disease and significant disease in the distal LAD close to the apex.  No significant disease affecting the right coronary artery.  2.  Severely reduced LV systolic function with an EF of 25%. 3.  Moderately to severely elevated left ventricular end-diastolic pressure at the beginning of the case with an LVEDP of 28 mmHg. 4.  Successful Impella device placement via the right common femoral artery for cardiogenic shock. 5.  Successful  angioplasty and drug-eluting stent placement to the left circumflex 6.  Successful right heart catheterization via the right internal jugular vein.  Right heart catheterization at end of the case showed mildly elevated filling pressures, mild pulmonary hypertension and normal cardiac output.  Recommendations: The patient was started on cangrelor drip which will be continued for 4 hours after loading dose of Brilinta is given. Continue heparin drip for Impella Further management of cardiogenic shock per Dr. Gala Romney The patient will require dual antiplatelet therapy for at least 12 months.   Portable Chest x-ray  Result Date: 02/15/2020 CLINICAL DATA:  Endotracheal tube and Swan-Ganz catheter placement. EXAM: PORTABLE CHEST 1 VIEW COMPARISON:  None. FINDINGS: Endotracheal tube tip 4.4 cm from the carina at the level of the clavicular heads. Right internal jugular Swan-Ganz catheter tip in the region of the main pulmonary outflow tract. Enteric tube is in place with tip below the diaphragm not included in the field of view. Impella device in place. Heart is normal in size with normal mediastinal contours. Vascular congestion. Retrocardiac left lung base atelectasis. Ill-defined patchy opacity in the right upper lobe. No pneumothorax or large pleural effusion. IMPRESSION: 1. Endotracheal tube tip 4.4 cm from the carina at the level of the clavicular heads. Swan-Ganz catheter tip in the region of the main pulmonary outflow tract. Impella device in place. 2. Vascular congestion. Ill-defined patchy opacity in the right upper lobe may be atelectasis, pneumonia, or asymmetric pulmonary edema. Electronically Signed    By: Narda Rutherford M.D.   On: 02/15/2020 16:14   DG Abd Portable 1V  Result Date: 02/15/2020 CLINICAL DATA:  OG tube placement. EXAM: PORTABLE ABDOMEN - 1 VIEW COMPARISON:  None. FINDINGS: Tip and side port of the enteric tube below the diaphragm in the stomach. Nonobstructive bowel gas pattern in the upper abdomen. Excreted IV contrast in the renal collecting systems. IMPRESSION: Tip and side port of the enteric tube below the diaphragm in the stomach. Electronically Signed   By: Narda Rutherford M.D.   On: 02/15/2020 16:15   ECHOCARDIOGRAM LIMITED  Result Date: 02/15/2020    ECHOCARDIOGRAM LIMITED REPORT   Patient Name:   REGINA COPPOLINO Date of Exam: 02/15/2020 Medical Rec #:  [id number]        Height:       70.0 in Accession #:    [id number]       Weight:       211.6 lb Date of Birth:  [birth year]        BSA:          2.138 m Patient Age:    64 years         BP:           107/92 mmHg Patient Gender: M                HR:           62 bpm. Exam Location:  Inpatient Procedure: Limited Echo and Limited Color Doppler Indications:    Cardiac Arrest I46.9  History:        Patient has no prior history of Echocardiogram examinations.  Sonographer:    Tiffany Dance Referring Phys: [id number] ALPFXTKW A ARIDA  Sonographer Comments: Technically difficult study due to poor echo windows and echo performed with patient supine and on artificial respirator. IMPRESSIONS  1. Non diagnostic only para sternal images. Impella device suitable distance distal to AV annulus No pericardial effusion Posterior lateral wall appears hypokinetic No color flow or  doppler performed. FINDINGS  Additional Comments: Non diagnostic only para sternal images. Impella device suitable distance distal to AV annulus No pericardial effusion Posterior lateral wall appears hypokinetic No color flow or doppler performed. Charlton Haws MD Electronically signed by Charlton Haws MD Signature Date/Time: 02/15/2020/4:05:49 PM    Final       Medications:      Current Medications: . [START ON 02/16/2020] aspirin  81 mg Per Tube Daily  . atorvastatin  80 mg Per Tube q1800  . docusate  100 mg Per Tube BID  . fentaNYL (SUBLIMAZE) injection  50 mcg Intravenous Once  . insulin aspart  0-9 Units Subcutaneous Q4H  . pantoprazole (PROTONIX) IV  40 mg Intravenous Daily  . polyethylene glycol  17 g Per Tube Daily  . potassium chloride  40 mEq Per Tube Q2H  . sodium chloride flush  3 mL Intravenous Q12H  . [START ON 02/16/2020] ticagrelor  90 mg Per Tube BID     Infusions: . sodium chloride    . amiodarone 60 mg/hr (02/15/20 1445)  . amiodarone    . cangrelor 50 mg in NS 250 mL 4 mcg/kg/min (02/15/20 1537)  . dextrose 5% lactated ringers    . epinephrine Stopped (02/15/20 1323)  . fentaNYL infusion INTRAVENOUS 75 mcg/hr (02/15/20 1219)  . impella catheter heparin 50 unit/mL in dextrose 5%    . lactated ringers    . magnesium sulfate bolus IVPB    . midazolam 2 mg/hr (02/15/20 1230)  . milrinone    . norepinephrine (LEVOPHED) Adult infusion    . potassium chloride 10 mEq (02/15/20 1608)      Assessment/Plan   1. Cardiac arrest 2. Acute systolic HF due in setting of acute MI -> cardiogenic shock 3. CAD with acute inferior STEMI  4. VT 5. Acute hypoxic respiratory failure in setting of cardiac arrest 6. Hypokalemia/hypomagnesemia  He is critically ill with severe post-MI shock and prolonged CPR. Continue hemodynamic support with Impella and inotropes. Will add milrinone. Continue IV amio for VT. D/w CCM . They will manage vent. We will attempt normothermia in setting of cardiac arrest.   Trend lactates and follow outputs closely.  Keep K> 4.0. Mg > 2.0   Continue DAPT and statin.  Prognosis guarded.   Total CCT 2.5 hours.    Length of Stay: 0  Arvilla Meres, MD  02/15/2020, 5:05 PM  Advanced Heart Failure Team Pager [phone] (M-F; 7a - 4p)  Please contact [facility] for night-coverage after hours (4p -7a ) and  weekends on [email or web address]

## 2020-02-15 NOTE — ED Triage Notes (Signed)
Pt to ED via EMS from Urgent Care parking lot, apparently pt was going to UC for chest pain, but never made it inside to be evaluated. Pt collapsed in the parking lot. EMS arrival monitor showed v-fib, shocked, CPR initated, 2 rounds of EPI given. achieved ROSC. STEMI on 12-lead. Pt then coded again, CPR in progress.   #18 RAC.

## 2020-02-15 NOTE — Progress Notes (Signed)
Left radial Arterial line inserted on 1st attempt with great blood return and waveform.  Sterile technique of gloves, drape, hat, mask, gown used.  Patient tolerated well.

## 2020-02-15 NOTE — Progress Notes (Signed)
ANTICOAGULATION CONSULT NOTE  Pharmacy Consult for heparin Indication: Impella  Not on File  Patient Measurements: Height: 5\' 10"  (177.8 cm) Weight: 96 kg (211 lb 10.3 oz) IBW/kg (Calculated) : 73 Heparin Dosing Weight:   Vital Signs: Temp: 100 F (37.8 C) (01/12 2200) BP: 113/94 (01/12 1637) Pulse Rate: 80 (01/12 2200)  Labs: Recent Labs    02/15/20 1114 02/15/20 1216 02/15/20 1333 02/15/20 1523 02/15/20 1658 02/15/20 1923  HGB 14.4 14.6 15.0  --  17.7*  --   HCT 45.7 43.0 44.0  --  52.0  --   PLT 280  --   --   --   --   --   APTT 28  --   --   --   --   --   LABPROT 14.0  --   --   --   --   --   INR 1.1  --   --   --   --   --   CREATININE 1.26*  --   --  1.08  --  1.28*  TROPONINIHS 195*  --   --  4,374*  --   --     Estimated Creatinine Clearance: 68.7 mL/min (A) (by C-G formula based on SCr of 1.28 mg/dL (H)).   Medical History: No past medical history on file.   Assessment: 73 yoM admitted with VF arrest and STEMI c/b cardiogenic shock requiring Impella CP placement. Pharmacy to begin IV heparin per protocol.   ACT 154 on 1/12@2211 . Cangrelor now off. Impella purge flow at 16.4 mL/hr (820 units/hr), purge pressures in 400s. Given ACT<160, will start heparin systemically at 100 units/hr and get level in 6 hours.   Goal of Therapy:  Heparin level 0.2-0.5 units/ml Monitor platelets by anticoagulation protocol: Yes   Plan:  -Continue heparinized purge solution -Start systemic heparin infusion at 100 units/hr -Order 6 hour heparin level -Monitor daily HL, CBC, LDH, and for s/sx of bleeding   3/12, PharmD, BCCCP Clinical Pharmacist  Phone: [phone] 02/15/2020 10:31 PM  Please check AMION for all [facility] Pharmacy phone numbers After 10:00 PM, call Main Pharmacy [phone]

## 2020-02-15 NOTE — Progress Notes (Addendum)
  Remains very tenuous. Continues with suction alarms at P-6. Maps were in 60s so I turned NE up.   Thermo outputs shot personally with CI ~1.7-1.8. (improved but still not making index).   Lactate checked and was 6.1.  I did echo personally and Impella was tucked behind anterior leaflet of mitral valve.  With Dr. Burns Spain help, I again re-attempted to reposition Impella catheter multiple times but could not secure a position in the mid LV.  I left the cannula a bit short (abot 3cm from AoV) to try and maximize flow without suction alarms. Best I could get was about 2L flow on P-5.   Will repeat lactic acid and swan numbers at 9p and readjust.   Family update at bedside.  CCT > 60 mins not including Impella repositioning.   Arvilla Meres, MD  8:32 PM

## 2020-02-15 NOTE — ED Provider Notes (Signed)
[place] EMERGENCY DEPARTMENT Provider Note  CSN: [id number] Arrival date & time: 02/15/20 1102    History No chief complaint on file.   HPI  Jeffrey Morse is a 65 y.o. male brought to the ED via EMS from UC as a Code STEMI s/p CPR. Per EMS report patient went to UC today for chest pain, collapsed in the parking lot before he made it inside. His family was there and initiated CPR and placed on monitor which showed v-tach. Shocked with ROSC. EMS arrived and reports he coded again. Given epi and additional shocks with ROSC. EKG then showed STEMI. Patient lost pulses again just prior to arrival and arrived with [facility] airway and chest compressions in progress. Cardiology team at bedside on patient's arrival.    No past medical history on file.    No family history on file.      Home Medications Prior to Admission medications   Not on File     Allergies    Patient has no allergy information on record.   Review of Systems   Review of Systems Unable to assess due to mental status.    Physical Exam There were no vitals taken for this visit.  Physical Exam Vitals and nursing note reviewed.  Constitutional:      Appearance: Normal appearance.  HENT:     Head: Normocephalic and atraumatic.     Nose: Nose normal.     Mouth/Throat:     Mouth: Mucous membranes are moist.  Eyes:     Extraocular Movements: Extraocular movements intact.     Conjunctiva/sclera: Conjunctivae normal.  Cardiovascular:     Comments: No heart sounds, no pulses without CPR Pulmonary:     Comments: King airway in place, easy to bag Abdominal:     General: Abdomen is flat.     Palpations: Abdomen is soft.     Tenderness: There is no abdominal tenderness.  Musculoskeletal:        General: No swelling. Normal range of motion.     Cervical back: Neck supple.  Skin:    General: Skin is warm and dry.  Neurological:     Comments: Unresponsive  Psychiatric:     Comments: Unable to assess       ED Results / Procedures / Treatments   Labs (all labs ordered are listed, but only abnormal results are displayed) Labs Reviewed  RESP PANEL BY RT-PCR (FLU A&B, COVID) ARPGX2  HEMOGLOBIN A1C  CBC WITH DIFFERENTIAL/PLATELET  PROTIME-INR  APTT  COMPREHENSIVE METABOLIC PANEL  LIPID PANEL  I-STAT CHEM 8, ED  TROPONIN I (HIGH SENSITIVITY)    EKG None in the ED. Pre-hospital EKG shows infero-lateral STEMI  Radiology No results found.  Procedures .Critical Care Performed by: Pollyann Savoy, MD Authorized by: Pollyann Savoy, MD   Critical care provider statement:    Critical care time (minutes):  30   Critical care time was exclusive of:  Separately billable procedures and treating other patients   Critical care was necessary to treat or prevent imminent or life-threatening deterioration of the following conditions:  Cardiac failure   Critical care was time spent personally by me on the following activities:  Discussions with consultants, evaluation of patient's response to treatment, examination of patient, ordering and performing treatments and interventions, ordering and review of laboratory studies, ordering and review of radiographic studies, pulse oximetry, re-evaluation of patient's condition, obtaining history from patient or surrogate and review of old charts Procedure Name: Intubation  Date/Time: 02/15/2020 11:19 AM Performed by: Pollyann Savoy, MD Pre-anesthesia Checklist: Patient identified, Patient being monitored, Emergency Drugs available, Timeout performed and Suction available Oxygen Delivery Method: Non-rebreather mask Preoxygenation: Pre-oxygenation with 100% oxygen Induction Type: Rapid sequence Ventilation: Mask ventilation without difficulty Laryngoscope Size: Glidescope and 3 Grade View: Grade I Tube size: 7.5 mm Number of attempts: 1 Placement Confirmation: ETT inserted through vocal cords under direct vision,  CO2 detector and Breath  sounds checked- equal and bilateral Secured at: 24 cm Tube secured with: ETT holder       Medications Ordered in the ED Medications - No data to display   MDM Rules/Calculators/A&P MDM Patient arrives with chest compressions in progress. Rhythm check shows a narrow complex rhythm with palpable femoral pulses. Preparations made to intubate. Patient lost pulses again just after intubation. Additional manual chest compressions done and another round of Epi with ROSC. Cardiology at bedside has checked bedside echo and no signs of tamponade. They will proceed to cath lab.  ED Course  I have reviewed the triage vital signs and the nursing notes.  Pertinent labs & imaging results that were available during my care of the patient were reviewed by me and considered in my medical decision making (see chart for details).     Final Clinical Impression(s) / ED Diagnoses Final diagnoses:  ST elevation myocardial infarction (STEMI), unspecified artery (HCC)  Cardiac arrest [facility])    Rx / DC Orders ED Discharge Orders    None       Pollyann Savoy, MD 02/15/20 1328

## 2020-02-15 NOTE — Progress Notes (Signed)
Visited with Patient per ED page.  Prayed for patient and family per family request. Patient going to cath lab. Spoke with son and other family members. No other request or present needs other than continued prayer.  Will follow as needed.Jac Canavan, Tita Terhaar, [place], [facility], Pager [phone]

## 2020-02-15 NOTE — Code Documentation (Signed)
Pulse check, No pulse,compressions

## 2020-02-15 NOTE — Code Documentation (Signed)
Pulse check, pulse palpable

## 2020-02-15 NOTE — Code Documentation (Signed)
Pulse check; palpable femoral pulse

## 2020-02-15 NOTE — Progress Notes (Signed)
ANTICOAGULATION CONSULT NOTE - Initial Consult  Pharmacy Consult for heparin Indication: Impella  Not on File  Patient Measurements:   Heparin Dosing Weight:   Vital Signs:    Labs: Recent Labs    02/15/20 1114 02/15/20 1216 02/15/20 1333  HGB 14.4 14.6 15.0  HCT 45.7 43.0 44.0  PLT 280  --   --   APTT 28  --   --   LABPROT 14.0  --   --   INR 1.1  --   --   CREATININE 1.26*  --   --   TROPONINIHS 195*  --   --     CrCl cannot be calculated (Unknown ideal weight.).   Medical History: No past medical history on file.   Assessment: 19 yoM admitted with VF arrest and STEMI c/b cardiogenic shock requiring Impella CP placement. Pharmacy to begin IV heparin per protocol.   Goal of Therapy:  Heparin level 0.2-0.5 units/ml Monitor platelets by anticoagulation protocol: Yes   Plan:  -Add heparinized purge solution -Check ACT hourly - once <160 will add systemic heparin  Fredonia Highland, PharmD, BCPS, [facility] Clinical Pharmacist [phone] Please check AMION for all [facility] Pharmacy numbers 02/15/2020

## 2020-02-16 ENCOUNTER — Inpatient Hospital Stay (HOSPITAL_COMMUNITY): Payer: BC Managed Care – PPO

## 2020-02-16 ENCOUNTER — Other Ambulatory Visit: Payer: Self-pay

## 2020-02-16 DIAGNOSIS — I469 Cardiac arrest, cause unspecified: Secondary | ICD-10-CM

## 2020-02-16 LAB — HEPATIC FUNCTION PANEL
ALT: 53 U/L — ABNORMAL HIGH (ref 0–44)
AST: 175 U/L — ABNORMAL HIGH (ref 15–41)
Albumin: 2.9 g/dL — ABNORMAL LOW (ref 3.5–5.0)
Alkaline Phosphatase: 67 U/L (ref 38–126)
Bilirubin, Direct: 0.2 mg/dL (ref 0.0–0.2)
Indirect Bilirubin: 0.4 mg/dL (ref 0.3–0.9)
Total Bilirubin: 0.6 mg/dL (ref 0.3–1.2)
Total Protein: 5.2 g/dL — ABNORMAL LOW (ref 6.5–8.1)

## 2020-02-16 LAB — BASIC METABOLIC PANEL
Anion gap: 10 (ref 5–15)
Anion gap: 10 (ref 5–15)
BUN: 24 mg/dL — ABNORMAL HIGH (ref 8–23)
BUN: 22 mg/dL (ref 8–23)
CO2: 20 mmol/L — ABNORMAL LOW (ref 22–32)
CO2: 23 mmol/L (ref 22–32)
Calcium: 8 mg/dL — ABNORMAL LOW (ref 8.9–10.3)
Calcium: 7.7 mg/dL — ABNORMAL LOW (ref 8.9–10.3)
Chloride: 105 mmol/L (ref 98–111)
Chloride: 104 mmol/L (ref 98–111)
Creatinine, Ser: 1.42 mg/dL — ABNORMAL HIGH (ref 0.61–1.24)
Creatinine, Ser: 1.15 mg/dL (ref 0.61–1.24)
GFR, Estimated: 56 mL/min — ABNORMAL LOW (ref >60)
GFR, Estimated: >60 mL/min (ref >60)
Glucose, Bld: 234 mg/dL — ABNORMAL HIGH (ref 70–99)
Glucose, Bld: 164 mg/dL — ABNORMAL HIGH (ref 70–99)
Potassium: 4.6 mmol/L (ref 3.5–5.1)
Potassium: 4 mmol/L (ref 3.5–5.1)
Sodium: 135 mmol/L (ref 135–145)
Sodium: 137 mmol/L (ref 135–145)

## 2020-02-16 LAB — COOXEMETRY PANEL
Carboxyhemoglobin: 0.6 % (ref 0.5–1.5)
Carboxyhemoglobin: 0.8 % (ref 0.5–1.5)
Methemoglobin: 0.9 % (ref 0.0–1.5)
Methemoglobin: 0.9 % (ref 0.0–1.5)
O2 Saturation: 72.2 %
O2 Saturation: 73.4 %
Total hemoglobin: 12.1 g/dL (ref 12.0–16.0)
Total hemoglobin: 10.4 g/dL — ABNORMAL LOW (ref 12.0–16.0)

## 2020-02-16 LAB — HEPARIN LEVEL (UNFRACTIONATED)
Heparin Unfractionated: 0.25 IU/mL — ABNORMAL LOW (ref 0.30–0.70)
Heparin Unfractionated: 0.2 IU/mL — ABNORMAL LOW (ref 0.30–0.70)
Heparin Unfractionated: <0.1 IU/mL — ABNORMAL LOW (ref 0.30–0.70)

## 2020-02-16 LAB — CBC
HCT: 36.3 % — ABNORMAL LOW (ref 39.0–52.0)
HCT: 32.5 % — ABNORMAL LOW (ref 39.0–52.0)
Hemoglobin: 11.9 g/dL — ABNORMAL LOW (ref 13.0–17.0)
Hemoglobin: 11.2 g/dL — ABNORMAL LOW (ref 13.0–17.0)
MCH: 28.8 pg (ref 26.0–34.0)
MCH: 29.9 pg (ref 26.0–34.0)
MCHC: 32.8 g/dL (ref 30.0–36.0)
MCHC: 34.5 g/dL (ref 30.0–36.0)
MCV: 87.9 fL (ref 80.0–100.0)
MCV: 86.7 fL (ref 80.0–100.0)
Platelets: 257 10*3/uL (ref 150–400)
Platelets: 212 10*3/uL (ref 150–400)
RBC: 4.13 MIL/uL — ABNORMAL LOW (ref 4.22–5.81)
RBC: 3.75 MIL/uL — ABNORMAL LOW (ref 4.22–5.81)
RDW: 13.2 % (ref 11.5–15.5)
RDW: 13.7 % (ref 11.5–15.5)
WBC: 18.5 10*3/uL — ABNORMAL HIGH (ref 4.0–10.5)
WBC: 11.3 10*3/uL — ABNORMAL HIGH (ref 4.0–10.5)
nRBC: 0 % (ref 0.0–0.2)
nRBC: 0 % (ref 0.0–0.2)

## 2020-02-16 LAB — LACTIC ACID, PLASMA
Lactic Acid, Venous: 2.9 mmol/L (ref 0.5–1.9)
Lactic Acid, Venous: 1.5 mmol/L (ref 0.5–1.9)
Lactic Acid, Venous: 1.1 mmol/L (ref 0.5–1.9)

## 2020-02-16 LAB — FIBRINOGEN: Fibrinogen: 371 mg/dL (ref 210–475)

## 2020-02-16 LAB — MRSA PCR SCREENING: MRSA by PCR: NEGATIVE

## 2020-02-16 LAB — MAGNESIUM: Magnesium: 1.7 mg/dL (ref 1.7–2.4)

## 2020-02-16 LAB — ECHOCARDIOGRAM LIMITED
Height: 70 in
Weight: 3710.78 oz

## 2020-02-16 LAB — PHOSPHORUS: Phosphorus: 3.2 mg/dL (ref 2.5–4.6)

## 2020-02-16 LAB — LACTATE DEHYDROGENASE
LDH: 512 U/L — ABNORMAL HIGH (ref 98–192)
LDH: 529 U/L — ABNORMAL HIGH (ref 98–192)

## 2020-02-16 LAB — PROCALCITONIN: Procalcitonin: 8.54 ng/mL

## 2020-02-16 LAB — GLUCOSE, CAPILLARY
Glucose-Capillary: 222 mg/dL — ABNORMAL HIGH (ref 70–99)
Glucose-Capillary: 182 mg/dL — ABNORMAL HIGH (ref 70–99)
Glucose-Capillary: 168 mg/dL — ABNORMAL HIGH (ref 70–99)
Glucose-Capillary: 138 mg/dL — ABNORMAL HIGH (ref 70–99)
Glucose-Capillary: 170 mg/dL — ABNORMAL HIGH (ref 70–99)

## 2020-02-16 LAB — BETA-HYDROXYBUTYRIC ACID: Beta-Hydroxybutyric Acid: 0.18 mmol/L (ref 0.05–0.27)

## 2020-02-16 MED ORDER — PROSOURCE TF PO LIQD
90.0000 mL | Freq: Three times a day (TID) | ORAL | Status: DC
  Administered 2020-02-16 – 2020-02-21 (×13): 90 mL
  Filled 2020-02-16 (×12): qty 90

## 2020-02-16 MED ORDER — ALBUMIN HUMAN 5 % IV SOLN
INTRAVENOUS | Status: AC
  Filled 2020-02-16: qty 250

## 2020-02-16 MED ORDER — INSULIN ASPART 100 UNIT/ML ~~LOC~~ SOLN
0.0000 [IU] | SUBCUTANEOUS | Status: DC
  Administered 2020-02-16: 3 [IU] via SUBCUTANEOUS
  Administered 2020-02-16: 2 [IU] via SUBCUTANEOUS
  Administered 2020-02-16 – 2020-02-17 (×6): 3 [IU] via SUBCUTANEOUS
  Administered 2020-02-17 – 2020-02-21 (×11): 2 [IU] via SUBCUTANEOUS
  Administered 2020-02-22 (×2): 3 [IU] via SUBCUTANEOUS
  Administered 2020-02-22: 5 [IU] via SUBCUTANEOUS
  Administered 2020-02-22 (×2): 3 [IU] via SUBCUTANEOUS
  Administered 2020-02-22 – 2020-02-23 (×4): 5 [IU] via SUBCUTANEOUS

## 2020-02-16 MED ORDER — ALBUMIN HUMAN 5 % IV SOLN
12.5000 g | Freq: Once | INTRAVENOUS | Status: AC
  Administered 2020-02-16: 12.5 g via INTRAVENOUS

## 2020-02-16 MED ORDER — FUROSEMIDE 10 MG/ML IJ SOLN
40.0000 mg | Freq: Once | INTRAMUSCULAR | Status: AC
  Administered 2020-02-16: 40 mg via INTRAVENOUS
  Filled 2020-02-16: qty 4

## 2020-02-16 MED ORDER — EPINEPHRINE 1 MG/10ML IJ SOSY
PREFILLED_SYRINGE | INTRAMUSCULAR | Status: AC
  Filled 2020-02-16: qty 10

## 2020-02-16 MED ORDER — VITAL 1.5 CAL PO LIQD
1000.0000 mL | ORAL | Status: DC
  Administered 2020-02-16: 1000 mL

## 2020-02-16 MED ORDER — CHLORHEXIDINE GLUCONATE CLOTH 2 % EX PADS
6.0000 | MEDICATED_PAD | Freq: Every day | CUTANEOUS | Status: DC
  Administered 2020-02-16 – 2020-02-28 (×13): 6 via TOPICAL

## 2020-02-16 MED ORDER — SODIUM CHLORIDE 0.9 % IV SOLN
2.0000 g | INTRAVENOUS | Status: DC
  Administered 2020-02-16 – 2020-02-19 (×4): 2 g via INTRAVENOUS
  Filled 2020-02-16 (×4): qty 20

## 2020-02-16 MED ORDER — LACTATED RINGERS IV SOLN: INTRAVENOUS | Status: DC

## 2020-02-16 MED FILL — Nitroglycerin IV Soln 100 MCG/ML in D5W: INTRA_ARTERIAL | Qty: 10 | Status: AC

## 2020-02-16 NOTE — Clinical Note (Incomplete)
CC: chest pn f/b cardiac arrest  HPI: 65 yo obese male no AC PTA, no cardiac hx. Outside of urgent care, chest pn then VF (immediate CPR) ROSC. Arrived thn PEA, cardiogenic shock then intubated. Then cath lab 1/12 PMH: HTN PTA Meds: escital 20, lisin 10, omep 20, simv 20,  ALL: unkn FH:  HF, premature CAD   Problem 1- CAD: STEMI Assessment: Echo: LAD 90% stenosed, Prox Cx 100% stenosed / EF 25-35%, cath impella & DES- prox cx. ECG inferior ST elevation w/ lateral involvement. Intubated [per tube]  - 1/12: bASA,  - 1/13: atov 80, amio 30mg /hr, Brilinta 90 bid   - Impella: Heparin 100u/hr  HL subtherapeutic [0.25] 1/13>   Scr: 1.42/ Hgb   Goal: 0.2- 0.5 - Rocephin 2g 1/13>  WBC: 19.2> 18.5 [1/13>]  Plan: monitor HL, CBC, s/sx bleeding   Best Practice:  1. Fent gtt, midazolam gtt, vasopressin [for shock] 0.03u/hr 1/12> 2. BG: 222 [1/13]/ Novolog 0-9u q4h  1/12>   Inc sliding scale 0-15u 3. protonix 40mg  IV>

## 2020-02-16 NOTE — Progress Notes (Signed)
Inpatient Diabetes Program Recommendations  AACE/ADA: New Consensus Statement on Inpatient Glycemic Control (2015)  Target Ranges:  Prepandial:   less than 140 mg/dL      Peak postprandial:   less than 180 mg/dL (1-2 hours)      Critically ill patients:  140 - 180 mg/dL   Lab Results  Component Value Date   GLUCAP 168 (H) 02/16/2020   HGBA1C 6.0 (H) 02/15/2020    Review of Glycemic Control Results for Jeffrey Morse, Jeffrey Morse (MRN [id number]) as of 02/16/2020 12:46  Ref. Range 02/15/2020 20:52 02/15/2020 23:53 02/16/2020 04:46 02/16/2020 08:23 02/16/2020 11:20  Glucose-Capillary Latest Ref Range: 70 - 99 mg/dL 115 (H) 726 (H) 203 (H) 182 (H) 168 (H)   Diabetes history: None Outpatient Diabetes medications:  None Current orders for Inpatient glycemic control:  Novolog moderate q 4 hours Inpatient Diabetes Program Recommendations:   Note elevated blood sugars.  Now stabilized with q 4 hour Novolog moderate correction.   Will follow. A1C does not reflect DM (pre-diabetes).   Thanks  Beryl Meager, RN, BC-ADM Inpatient Diabetes Coordinator Pager [phone] (8a-5p)

## 2020-02-16 NOTE — Progress Notes (Signed)
ANTICOAGULATION CONSULT NOTE  Pharmacy Consult for heparin Indication: Impella  No Known Allergies  Patient Measurements: Height: 5\' 10"  (177.8 cm) Weight: 105.2 kg (231 lb 14.8 oz) IBW/kg (Calculated) : 73 Heparin Dosing Weight: 92.7 kg  Vital Signs: Temp: 99.7 F (37.6 C) (01/13 2215) Temp Source: Core (01/13 2000) BP: 114/68 (01/13 1500) Pulse Rate: 69 (01/13 2215)  Labs: Recent Labs    02/15/20 1114 02/15/20 1216 02/15/20 1523 02/15/20 1658 02/15/20 2133 02/15/20 2250 02/16/20 0323 02/16/20 0448 02/16/20 1200 02/16/20 1618 02/16/20 2022  HGB 14.4   < >  --    < >  --  12.8* 11.9*  --   --  11.2*  --   HCT 45.7   < >  --    < >  --  39.2 36.3*  --   --  32.5*  --   PLT 280  --   --   --   --  311 257  --   --  212  --   APTT 28  --   --   --   --   --   --   --   --   --   --   LABPROT 14.0  --   --   --   --   --   --   --   --   --   --   INR 1.1  --   --   --   --   --   --   --   --   --   --   HEPARINUNFRC  --   --   --   --   --   --   --  0.25* 0.20*  --  <0.10*  CREATININE 1.26*  --  1.08   < > 1.48*  --  1.42*  --   --  1.15  --   TROPONINIHS 195*  --  4,374*  --   --   --   --   --   --   --   --    < > = values in this interval not displayed.    Estimated Creatinine Clearance: 79.9 mL/min (by C-G formula based on SCr of 1.15 mg/dL).   Medical History: Past Medical History:  Diagnosis Date  . HTN (hypertension)   . Hypercholesteremia   . Sleep apnea      Assessment: 65 yo male admitted with VF arrest and STEMI c/b cardiogenic shock requiring Impella CP placement. Pharmacy is consulted to dose heparin.  Impella purge flow at 16 mL/hr (800 units/hr). Heparin level is now trended down to sub-therapeutic at <0.10. Systemic heparin has been running at 150 units/hr. Patient is without signs or symptoms of bleeding on exam. CBC is WNL and stable.  Goal of Therapy:  Heparin level 0.2-0.5 units/ml Monitor platelets by anticoagulation protocol:  Yes   Plan:  -Increase systemic IV heparin infusion to 250 units/hr -Continue heparinized purge solution -Obtain a 6 hour heparin level -Monitor daily HL, CBC, LDH, and for s/sx of bleeding   65, PharmD, BCPS, BCCCP Clinical Pharmacist 02/16/2020 10:27 PM  Please check [email or web address] for unit-specific pharmacy phone numbers.

## 2020-02-16 NOTE — Progress Notes (Signed)
NAME:  Jeffrey Morse, MRN:  [id number], DOB:  Nov 14, 1955, LOS: 1 ADMISSION DATE:  02/15/2020, CONSULTATION DATE:  02/15/20 REFERRING MD:  Junious Dresser  CHIEF COMPLAINT:  VF arrest   Brief History   Jeffrey Morse is a 65 y.o. male who was brought to [facility] ED 1/12 with VT arrest after he collapsed at [facility] (had gone there for chest pain).  He had multiple recurrent arrests before being taken to cath lab where he was found to have severe LV dysfunction with EF 25-35%, and 100% prox Cx stenosis.  He had DES and Impella placed and was then transferred to the ICU where PCCM was asked to assist with vent management.  History of present illness   Pt is encephelopathic; therefore, this HPI is obtained from chart review. Jeffrey Morse is a 65 y.o. male who has unknown PMH.  He was going to [facility] 1/12 for chest pain and before he got inside, he collapsed and went unresponsive.  Family was with him and began CPR while awaiting EMS.  On EMS arrival, he was found to have VT.  He was shocked once and had ROSC; however, he proceeded to have multiple recurrent arrests.  He was brought to [facility] and taken straight to cath lab where he was found to have severe LV dysfunction EF 25 - 35%, Ost LAD 40% stenosed, Dist LAD 90% stenosed, 1st Marg lesion 90% stenosed, Ost Cx 40% stenosed, Prox Cx 100% stenosed.   He had DES and Impella placed and was transferred to the ICU where PCCM was asked to assist with vent management.  He is currently on 11mcg/min levophed.  Past Medical History  Unknown. has Cardiogenic shock (HCC); Cardiac arrest [facility]); ST elevation myocardial infarction (STEMI) (HCC); Encounter for imaging study to confirm orogastric (OG) tube placement; and Acute respiratory failure with hypoxia (HCC) on their problem list.  Significant Hospital Events   1/12 > admit.  Consults:  Cards, PCCM.  Procedures:  ETT 1/12 >   Significant Diagnostic Tests:  Cath lab 1/12 > Severe LV dysfunction EF 25 - 35%, Ost LAD 40%  stenosed, Dist LAD 90% stenosed, 1st Marg lesion 90% stenosed, Ost Cx 40% stenosed, Prox Cx 100% stenosed.  DES placed, Impella placed. Echo 1/12 >   Micro Data:  Flu 1/12 > negative. COVID 1/12 > negative.  Antimicrobials:  None.   Interim history/subjective:  1/13: sedated on vent, impella in place. Off epi but remains on norepi/vaso/amio  Objective:  Blood pressure 104/62, pulse 69, temperature 99.32 F (37.4 C), resp. rate (!) 24, height 5\' 10"  (1.778 m), weight 105.2 kg, SpO2 100 %. PAP: (24-46)/(15-37) 28/19 CVP:  [9 mmHg-23 mmHg] 10 mmHg CO:  [3.9 L/min-7.4 L/min] 7.4 L/min CI:  [1.8 L/min/m2-3.5 L/min/m2] 3.5 L/min/m2  Vent Mode: PRVC FiO2 (%):  [70 %-100 %] 90 % Set Rate:  [20 bmp-24 bmp] 24 bmp Vt Set:  [560 mL] 560 mL PEEP:  [10 cmH20-12 cmH20] 12 cmH20 Plateau Pressure:  [20 cmH20-28 cmH20] 22 cmH20   Intake/Output Summary (Last 24 hours) at 02/16/2020 0942 Last data filed at 02/16/2020 0900 Gross per 24 hour  Intake 5550.75 ml  Output 1845 ml  Net 3705.75 ml   Filed Weights   02/15/20 1437 02/16/20 0600  Weight: 96 kg 105.2 kg    Examination: General: Adult male, resting in bed, in NAD. Neuro: Sedate, on vent HEENT: [place]/AT. Sclerae anicteric. ETT in place. Cardiovascular: RRR, no M/R/G.  Lungs: Respirations even and unlabored.  Coarse bilaterally. Abdomen: BS hypoactive, soft, NT/ND.  Musculoskeletal: No gross deformities, + edema.  Skin: Intact, warm, no rashes.   Assessment & Plan:  Acute hypoxic resp failure:  -titrate vent, down to 70% today -sat/sbt when able -vap protocol in place -cxr without acute infiltrate, personally reviewed (1/13)  OOHCA s/p ROSC VT cardiac arrest - s/p cath lab with findings of 100% stenosed prox Cx s/p DES and Impella due to severely depressed LVEF (25 - 35%). Cardiogenic Shock. CAD s/p DES to prox cx - Cards and heart failure teams / managing. - Normothermia protocol / avoid fevers. - Continue norepinephrine,  amiodarone, Cangerlor, Ticagrelor. - Heparin purge via Impella. - Echo.  Hypokalemia. - resolved  hagma 2/2 lactic acidosis:  -BHA negative -no dx of dm2 -improving  Prediabetes with hyperglycemia:  -starting tf -will increase ssi, may need some basal but will follow for 24 hours  Transaminitis - presumed 2/2 cardiac arrest. - Trend. - Caution with Amiodarone.   Best Practice (evaluated daily):  Diet: NPO. Consult nutrition for tf Pain/Anxiety/Delirium protocol (if indicated): Fentanyl gtt / Midazolam gtt.  RASS goal -1. VAP protocol (if indicated): In place. DVT prophylaxis: SCD's. GI prophylaxis: PPI. Glucose control: Insulin ssi Mobility: Bedrest. Disposition: ICU.  Goals of Care:  Last date of multidisciplinary goals of care discussion:per primary Family and staff present: None. Summary of discussion: None. Follow up goals of care discussion due: 1/18. Code Status:  Full.  Labs   CBC: Recent Labs  Lab 02/15/20 1114 02/15/20 1216 02/15/20 1333 02/15/20 1658 02/15/20 2250 02/16/20 0323  WBC 14.2*  --   --   --  19.2* 18.5*  NEUTROABS 7.8*  --   --   --   --   --   HGB 14.4 14.6 15.0 17.7* 12.8* 11.9*  HCT 45.7 43.0 44.0 52.0 39.2 36.3*  MCV 90.7  --   --   --  88.3 87.9  PLT 280  --   --   --  311 257   Basic Metabolic Panel: Recent Labs  Lab 02/15/20 1114 02/15/20 1216 02/15/20 1523 02/15/20 1658 02/15/20 1923 02/15/20 2133 02/16/20 0323  NA 136   < > 138 138 136 137 135  K 2.9*   < > 3.2* 5.4* 4.2 4.0 4.6  CL 104  --  103  --  108 107 105  CO2 15*  --  22  --  17* 20* 20*  GLUCOSE 324*  --  333*  --  239* 266* 234*  BUN 19  --  19  --  21 22 24*  CREATININE 1.26*  --  1.08  --  1.28* 1.48* 1.42*  CALCIUM 8.7*  --  8.1*  --  8.2* 8.1* 8.0*  MG  --   --  1.7  --  2.0  --   --    < > = values in this interval not displayed.   GFR: Estimated Creatinine Clearance: 64.7 mL/min (A) (by C-G formula based on SCr of 1.42 mg/dL (H)). Recent Labs   Lab 02/15/20 1114 02/15/20 1706 02/15/20 2039 02/15/20 2250 02/16/20 0107 02/16/20 0323 02/16/20 0808  WBC 14.2*  --   --  19.2*  --  18.5*  --   LATICACIDVEN  --  6.1* 3.7*  --  2.9*  --  1.5   Liver Function Tests: Recent Labs  Lab 02/15/20 1114 02/16/20 0323  AST 73* 175*  ALT 54* 53*  ALKPHOS 91 67  BILITOT 0.8 0.6  PROT 6.1* 5.2*  ALBUMIN 3.5 2.9*   No results for input(s): LIPASE, AMYLASE in the last 168 hours. No results for input(s): AMMONIA in the last 168 hours. ABG    Component Value Date/Time   PHART 7.357 02/15/2020 1658   PCO2ART 38.1 02/15/2020 1658   PO2ART 137 (H) 02/15/2020 1658   HCO3 21.6 02/15/2020 1658   TCO2 23 02/15/2020 1658   ACIDBASEDEF 4.0 (H) 02/15/2020 1658   O2SAT 72.2 02/16/2020 0506    Coagulation Profile: Recent Labs  Lab 02/15/20 1114  INR 1.1   Cardiac Enzymes: No results for input(s): CKTOTAL, CKMB, CKMBINDEX, TROPONINI in the last 168 hours. HbA1C: Hgb A1c MFr Bld  Date/Time Value Ref Range Status  02/15/2020 11:14 AM 6.0 (H) 4.8 - 5.6 % Final    Comment:    (NOTE) Pre diabetes:          5.7%-6.4%  Diabetes:              >6.4%  Glycemic control for   <7.0% adults with diabetes    CBG: Recent Labs  Lab 02/15/20 1605 02/15/20 2035 02/15/20 2052 02/15/20 2353 02/16/20 0446  GLUCAP 290* 234* 244* 262* 222*   Critical care time: The patient is critically ill with multiple organ systems failure and requires high complexity decision making for assessment and support, frequent evaluation and titration of therapies, application of advanced monitoring technologies and extensive interpretation of multiple databases.  Critical care time 35 mins. This represents my time independent of the NPs time taking care of the pt. This is excluding procedures.    Briant Sites DO [facility] 02/16/2020, 9:42 AM

## 2020-02-16 NOTE — Progress Notes (Signed)
ANTICOAGULATION CONSULT NOTE  Pharmacy Consult for heparin Indication: Impella  Not on File  Patient Measurements: Height: 5\' 10"  (177.8 cm) Weight: 96 kg (211 lb 10.3 oz) IBW/kg (Calculated) : 73 Heparin Dosing Weight:   Vital Signs: Temp: 97.2 F (36.2 C) (01/13 0500) Temp Source: Core (01/12 2000) Pulse Rate: 73 (01/13 0500)  Labs: Recent Labs    02/15/20 1114 02/15/20 1216 02/15/20 1523 02/15/20 1658 02/15/20 1923 02/15/20 2133 02/15/20 2250 02/16/20 0323 02/16/20 0448  HGB 14.4   < >  --  17.7*  --   --  12.8* 11.9*  --   HCT 45.7   < >  --  52.0  --   --  39.2 36.3*  --   PLT 280  --   --   --   --   --  311 257  --   APTT 28  --   --   --   --   --   --   --   --   LABPROT 14.0  --   --   --   --   --   --   --   --   INR 1.1  --   --   --   --   --   --   --   --   HEPARINUNFRC  --   --   --   --   --   --   --   --  0.25*  CREATININE 1.26*  --  1.08  --  1.28* 1.48*  --  1.42*  --   TROPONINIHS 195*  --  4,374*  --   --   --   --   --   --    < > = values in this interval not displayed.    Estimated Creatinine Clearance: 61.9 mL/min (A) (by C-G formula based on SCr of 1.42 mg/dL (H)).   Medical History: No past medical history on file.   Assessment: 82 yoM admitted with VF arrest and STEMI c/b cardiogenic shock requiring Impella CP placement. Pharmacy to begin IV heparin per protocol.   ACT 154 on 1/12@2211 . Cangrelor now off. Impella purge flow at 16.4 mL/hr (820 units/hr), purge pressures in 400s. Given ACT<160, will start heparin systemically at 100 units/hr and get level in 6 hours.   1/13 AM update:  Initial heparin level therapeutic at 0.25  Goal of Therapy:  Heparin level 0.2-0.5 units/ml Monitor platelets by anticoagulation protocol: Yes   Plan:  -Continue heparinized purge solution -Cont systemic heparin infusion at 100 units/hr -1200 heparin level -Monitor daily HL, CBC, LDH, and for s/sx of bleeding   2/13, PharmD,  BCPS Clinical Pharmacist Phone: [phone]

## 2020-02-16 NOTE — Plan of Care (Signed)
  Problem: Respiratory: Goal: Ability to maintain a clear airway and adequate ventilation will improve Outcome: Progressing   Problem: Activity: Goal: Ability to return to baseline activity level will improve Outcome: Progressing   Problem: Cardiovascular: Goal: Ability to achieve and maintain adequate cardiovascular perfusion will improve Outcome: Progressing Goal: Vascular access site(s) Level 0-1 will be maintained Outcome: Progressing

## 2020-02-16 NOTE — Progress Notes (Signed)
Initial Nutrition Assessment  DOCUMENTATION CODES:   Not applicable  INTERVENTION:   Tube feeding:  -Vital 1.5 @ 20 ml/hr via OG -Increase by 10 ml Q4 hours to goal rate of 55 ml/hr (1320 ml) -ProSource TF 90 ml TID  Provides: 2220 kcals, 155 grams protein, 1008 ml free water.   NUTRITION DIAGNOSIS:   Increased nutrient needs related to post-op healing as evidenced by estimated needs.  GOAL:   Patient will meet greater than or equal to 90% of their needs  MONITOR:   Weight trends,Labs,I & O's,Vent status,Skin,TF tolerance  REASON FOR ASSESSMENT:   Consult Enteral/tube feeding initiation and management  ASSESSMENT:   Patient with PMH of HTN.  Presents this admission with VT arrest.   01/12- s/p cath  with DES x1 and impella placement  Pt discussed during ICU rounds and with RN.   Requiring pressors. Failed vent wean. Weaning impella as able. Xray confirms OG located in the stomach. Okay to start feeding per CCM.   Weight history limited over the last year. Utilize 96 kg as EDW for now.   Patient is currently intubated on ventilator support MV: 12.7 L/min Temp (24hrs), Avg:99 F (37.2 C), Min:94.82 F (34.9 C), Max:100 F (37.8 C)   UOP: 1385 ml x 24 hrs   Drips: LR @ 20 ml/hr, levophed, vasopressin  Medications: colace, SS novolog, miralax Labs: Cr 1.42- down from yesterday CBG 168-324  NUTRITION - FOCUSED PHYSICAL EXAM:  Flowsheet Row Most Recent Value  Orbital Region No depletion  Upper Arm Region No depletion  Thoracic and Lumbar Region Unable to assess  Buccal Region Unable to assess  Temple Region No depletion  Clavicle Bone Region Mild depletion  Clavicle and Acromion Bone Region Mild depletion  Scapular Bone Region Unable to assess  Dorsal Hand No depletion  Patellar Region No depletion  Anterior Thigh Region No depletion  Posterior Calf Region No depletion  Edema (RD Assessment) Mild  Hair Reviewed  Eyes Unable to assess  Mouth Unable  to assess  Skin Reviewed  Nails Reviewed     Diet Order:   Diet Order            Diet NPO time specified  Diet effective now                 EDUCATION NEEDS:   Not appropriate for education at this time  Skin:  Skin Assessment: Skin Integrity Issues: Skin Integrity Issues:: Incisions Incisions: abdomen  Last BM:  PTA  Height:   Ht Readings from Last 1 Encounters:  02/15/20 5\' 10"  (1.778 m)    Weight:   Wt Readings from Last 1 Encounters:  02/16/20 105.2 kg    BMI:  Body mass index is 33.28 kg/m.  Estimated Nutritional Needs:   Kcal:  2177 kcal  Protein:  140-165 grams  Fluid:  >/= 2 L/day  2178 RD, LDN Clinical Nutrition Pager listed in AMION

## 2020-02-16 NOTE — Progress Notes (Signed)
Advanced Heart Failure Rounding Note   Subjective:    Remains intubated.  Had a busy night. Pressors adjusted. Got albumin.   Now on VP 0.03, NE 38 and milrinone 0.25. remains on Impella at P-5 getting about 1.8L (limited by suction alarms). Groin ok. Good waveforms. Lactate trending down. Co-ox 72%  Failed vent wean to 80% remains on 100% FiO2.   Echo today at bedside EF ~50-55% LV small RV ok. Impella proximal in LV (I tried to advance but got more suction alarms)   Swan #s  CVP 9  PA 27/19 Thermo 7.4/3.4 SVR 718  Objective:   Weight Range:  Vital Signs:   Temp:  [94.82 F (34.9 C)-100 F (37.8 C)] 99.5 F (37.5 C) (01/13 0800) Pulse Rate:  [57-80] 69 (01/13 0807) Resp:  [14-28] 24 (01/13 0807) BP: (104-113)/(62-94) 104/62 (01/13 0807) SpO2:  [79 %-100 %] 99 % (01/13 0807) Arterial Line BP: (71-132)/(50-98) 104/62 (01/13 0800) FiO2 (%):  [70 %-100 %] 90 % (01/13 0807) Weight:  [96 kg-105.2 kg] 105.2 kg (01/13 0600) Last BM Date:  (UTA)  Weight change: Filed Weights   02/15/20 1437 02/16/20 0600  Weight: 96 kg 105.2 kg    Intake/Output:   Intake/Output Summary (Last 24 hours) at 02/16/2020 0847 Last data filed at 02/16/2020 0700 Gross per 24 hour  Intake 5290.91 ml  Output 1685 ml  Net 3605.91 ml     Physical Exam: General:  Intubated. Sedated HEENT: normal + ETT Neck: supple. RIJ swan Carotids 2+ bilat; no bruits. No lymphadenopathy or thryomegaly appreciated. Cor: PMI nondisplaced. Regular rate & rhythm. No rubs, gallops or murmurs. Lungs: coarse Abdomen: soft, nontender, nondistended. No hepatosplenomegaly. No bruits or masses. Good bowel sounds. Extremities: no cyanosis, clubbing, rash, edema RFA Impella.  Neuro: sedates   Telemetry: Sinus 60-70s Personally reviewed  Labs: Basic Metabolic Panel: Recent Labs  Lab 02/15/20 1114 02/15/20 1216 02/15/20 1523 02/15/20 1658 02/15/20 1923 02/15/20 2133 02/16/20 0323  NA 136   < > 138 138  136 137 135  K 2.9*   < > 3.2* 5.4* 4.2 4.0 4.6  CL 104  --  103  --  108 107 105  CO2 15*  --  22  --  17* 20* 20*  GLUCOSE 324*  --  333*  --  239* 266* 234*  BUN 19  --  19  --  21 22 24*  CREATININE 1.26*  --  1.08  --  1.28* 1.48* 1.42*  CALCIUM 8.7*  --  8.1*  --  8.2* 8.1* 8.0*  MG  --   --  1.7  --  2.0  --   --    < > = values in this interval not displayed.    Liver Function Tests: Recent Labs  Lab 02/15/20 1114 02/16/20 0323  AST 73* 175*  ALT 54* 53*  ALKPHOS 91 67  BILITOT 0.8 0.6  PROT 6.1* 5.2*  ALBUMIN 3.5 2.9*   No results for input(s): LIPASE, AMYLASE in the last 168 hours. No results for input(s): AMMONIA in the last 168 hours.  CBC: Recent Labs  Lab 02/15/20 1114 02/15/20 1216 02/15/20 1333 02/15/20 1658 02/15/20 2250 02/16/20 0323  WBC 14.2*  --   --   --  19.2* 18.5*  NEUTROABS 7.8*  --   --   --   --   --   HGB 14.4 14.6 15.0 17.7* 12.8* 11.9*  HCT 45.7 43.0 44.0 52.0 39.2 36.3*  MCV  90.7  --   --   --  88.3 87.9  PLT 280  --   --   --  311 257    Cardiac Enzymes: No results for input(s): CKTOTAL, CKMB, CKMBINDEX, TROPONINI in the last 168 hours.  BNP: BNP (last 3 results) No results for input(s): BNP in the last 8760 hours.  ProBNP (last 3 results) No results for input(s): PROBNP in the last 8760 hours.    Other results:  Imaging: CARDIAC CATHETERIZATION  Result Date: 02/15/2020  There is severe left ventricular systolic dysfunction.  LV end diastolic pressure is moderately elevated.  The left ventricular ejection fraction is 25-35% by visual estimate.  Ost LAD lesion is 40% stenosed.  Dist LAD lesion is 90% stenosed.  1st Mrg lesion is 90% stenosed.  Ost Cx lesion is 40% stenosed.  Prox Cx lesion is 100% stenosed.  Post intervention, there is a 0% residual stenosis.  A drug-eluting stent was successfully placed using a STENT RESOLUTE ONYX 3.0X26.  1.  Out of hospital cardiac arrest and cardiogenic shock due to occluded  mid left circumflex.  The left circumflex and LAD has 2 separate ostia.  There is moderate ostial LAD disease and significant disease in the distal LAD close to the apex.  No significant disease affecting the right coronary artery. 2.  Severely reduced LV systolic function with an EF of 25%. 3.  Moderately to severely elevated left ventricular end-diastolic pressure at the beginning of the case with an LVEDP of 28 mmHg. 4.  Successful Impella device placement via the right common femoral artery for cardiogenic shock. 5.  Successful angioplasty and drug-eluting stent placement to the left circumflex 6.  Successful right heart catheterization via the right internal jugular vein.  Right heart catheterization at end of the case showed mildly elevated filling pressures, mild pulmonary hypertension and normal cardiac output.  Recommendations: The patient was started on cangrelor drip which will be continued for 4 hours after loading dose of Brilinta is given. Continue heparin drip for Impella Further management of cardiogenic shock per Dr. Haroldine Laws The patient will require dual antiplatelet therapy for at least 12 months.   Portable Chest x-ray  Result Date: 02/15/2020 CLINICAL DATA:  Endotracheal tube and Swan-Ganz catheter placement. EXAM: PORTABLE CHEST 1 VIEW COMPARISON:  None. FINDINGS: Endotracheal tube tip 4.4 cm from the carina at the level of the clavicular heads. Right internal jugular Swan-Ganz catheter tip in the region of the main pulmonary outflow tract. Enteric tube is in place with tip below the diaphragm not included in the field of view. Impella device in place. Heart is normal in size with normal mediastinal contours. Vascular congestion. Retrocardiac left lung base atelectasis. Ill-defined patchy opacity in the right upper lobe. No pneumothorax or large pleural effusion. IMPRESSION: 1. Endotracheal tube tip 4.4 cm from the carina at the level of the clavicular heads. Swan-Ganz catheter tip in the  region of the main pulmonary outflow tract. Impella device in place. 2. Vascular congestion. Ill-defined patchy opacity in the right upper lobe may be atelectasis, pneumonia, or asymmetric pulmonary edema. Electronically Signed   By: Keith Rake M.D.   On: 02/15/2020 16:14   DG Abd Portable 1V  Result Date: 02/15/2020 CLINICAL DATA:  OG tube placement. EXAM: PORTABLE ABDOMEN - 1 VIEW COMPARISON:  None. FINDINGS: Tip and side port of the enteric tube below the diaphragm in the stomach. Nonobstructive bowel gas pattern in the upper abdomen. Excreted IV contrast in the renal collecting systems.  IMPRESSION: Tip and side port of the enteric tube below the diaphragm in the stomach. Electronically Signed   By: Keith Rake M.D.   On: 02/15/2020 16:15   ECHOCARDIOGRAM LIMITED  Result Date: 02/15/2020    ECHOCARDIOGRAM LIMITED REPORT   Patient Name:   ARGIL MAHL Date of Exam: 02/15/2020 Medical Rec #:  [id number]        Height:       70.0 in Accession #:    [id number]       Weight:       211.6 lb Date of Birth:  03-24-1955        BSA:          2.138 m Patient Age:    40 years         BP:           107/92 mmHg Patient Gender: M                HR:           62 bpm. Exam Location:  Inpatient Procedure: Limited Echo and Limited Color Doppler Indications:    Cardiac Arrest I46.9  History:        Patient has no prior history of Echocardiogram examinations.  Sonographer:    Tiffany Dance Referring Phys: [id number] A ARIDA  Sonographer Comments: Technically difficult study due to poor echo windows and echo performed with patient supine and on artificial respirator. IMPRESSIONS  1. Non diagnostic only para sternal images. Impella device suitable distance distal to AV annulus No pericardial effusion Posterior lateral wall appears hypokinetic No color flow or doppler performed. FINDINGS  Additional Comments: Non diagnostic only para sternal images. Impella device suitable distance distal to AV annulus No  pericardial effusion Posterior lateral wall appears hypokinetic No color flow or doppler performed. Jenkins Rouge MD Electronically signed by Jenkins Rouge MD Signature Date/Time: 02/15/2020/4:05:49 PM    Final       Medications:     Scheduled Medications: . aspirin  81 mg Per Tube Daily  . atorvastatin  80 mg Per Tube q1800  . chlorhexidine gluconate (MEDLINE KIT)  15 mL Mouth Rinse BID  . Chlorhexidine Gluconate Cloth  6 each Topical Daily  . docusate  100 mg Per Tube BID  . fentaNYL (SUBLIMAZE) injection  50 mcg Intravenous Once  . insulin aspart  0-9 Units Subcutaneous Q4H  . mouth rinse  15 mL Mouth Rinse 10 times per day  . pantoprazole (PROTONIX) IV  40 mg Intravenous Daily  . polyethylene glycol  17 g Per Tube Daily  . sodium chloride flush  3 mL Intravenous Q12H  . ticagrelor  90 mg Per Tube BID     Infusions: . sodium chloride    . amiodarone 30 mg/hr (02/16/20 0700)  . dextrose 5% lactated ringers    . epinephrine Stopped (02/15/20 1323)  . fentaNYL infusion INTRAVENOUS 175 mcg/hr (02/16/20 0700)  . heparin 100 Units/hr (02/16/20 0700)  . impella catheter heparin 50 unit/mL in dextrose 5%    . lactated ringers 50 mL/hr at 02/16/20 0700  . midazolam 5 mg/hr (02/16/20 0700)  . milrinone 0.25 mcg/kg/min (02/16/20 0700)  . norepinephrine (LEVOPHED) Adult infusion 40 mcg/min (02/16/20 0700)  . vasopressin 0.03 Units/min (02/16/20 0829)     PRN Medications:  sodium chloride, sodium chloride, dextrose, fentaNYL, midazolam, ondansetron (ZOFRAN) IV, sodium chloride flush   Assessment/Plan:   1. Cardiac arrest in setting of acute inferolateral STEMI/VT - OOH cardiac arrest  on 1/12 with prolonged CPR, resuscitated w/ CPR + defibrillation   - Emergent cath w/ totally occluded mid LCx treated w/ PCI + DES  2. Acute systolic HF due in setting of acute MI -> cardiogenic shock - EF 25% in cath lab - Impella CP in place - Limited bedside echo (1/13) EF 50-55% -  Hemodynamics much improved. SVR low. Co-ox 72% Lactic acid 1.5 - Will back down milrinone. Continue NE and VP. Wean NE as tolerated - With low SVR. Wil check PCT and cover for possible aspiration PNA.  - Can wean Impella slowly. Continue heparin  3. CAD with acute inferolateral  STEMI  - Emergent cath 1/12 w/ totally occluded mid LCx treated w/ PCI + DES - There is moderate ostial LAD disease and significant disease in the distal LAD close to the apex. No significant disease affecting the right coronary artery. - Continue heparin gtt  - DAPT, ASA + Brilinta   4. VT  - quiescent on amio - keep K > 4.0 MG > 2.0  5. Acute hypoxic respiratory failure in setting of cardiac arrest - remains on FiO2 100% - check CXR - diurese as tolerated - possible aspiration PNA.  - check PCT. Cover with abx - CCM following  6. Hypokalemia/hypomagnesemia - supp   7. AKI - due to ATN - follow. Continue hemodynamic support  8. DM2 - new diagnosis - check HgbA1c - DM2 consult  CRITICAL CARE Performed by: Glori Bickers  Total critical care time: 50 minutes  Critical care time was exclusive of separately billable procedures and treating other patients.  Critical care was necessary to treat or prevent imminent or life-threatening deterioration.  Critical care was time spent personally by me (independent of midlevel providers or residents) on the following activities: development of treatment plan with patient and/or surrogate as well as nursing, discussions with consultants, evaluation of patient's response to treatment, examination of patient, obtaining history from patient or surrogate, ordering and performing treatments and interventions, ordering and review of laboratory studies, ordering and review of radiographic studies, pulse oximetry and re-evaluation of patient's condition.   Length of Stay: 1   Glori Bickers MD 02/16/2020, 8:47 AM  Advanced Heart Failure Team Pager  [phone] (M-F; [place])  Please contact [facility] for night-coverage after hours (4p -7a ) and weekends on [email or web address]

## 2020-02-16 NOTE — Progress Notes (Signed)
ANTICOAGULATION CONSULT NOTE  Pharmacy Consult for heparin Indication: Impella  No Known Allergies  Patient Measurements: Height: 5\' 10"  (177.8 cm) Weight: 105.2 kg (231 lb 14.8 oz) IBW/kg (Calculated) : 73 Heparin Dosing Weight: 92.7 kg  Vital Signs: Temp: 99.68 F (37.6 C) (01/13 1330) Temp Source: Core (01/13 1200) BP: 114/69 (01/13 1116) Pulse Rate: 72 (01/13 1330)  Labs: Recent Labs    02/15/20 1114 02/15/20 1216 02/15/20 1523 02/15/20 1658 02/15/20 1923 02/15/20 2133 02/15/20 2250 02/16/20 0323 02/16/20 0448 02/16/20 1200  HGB 14.4   < >  --  17.7*  --   --  12.8* 11.9*  --   --   HCT 45.7   < >  --  52.0  --   --  39.2 36.3*  --   --   PLT 280  --   --   --   --   --  311 257  --   --   APTT 28  --   --   --   --   --   --   --   --   --   LABPROT 14.0  --   --   --   --   --   --   --   --   --   INR 1.1  --   --   --   --   --   --   --   --   --   HEPARINUNFRC  --   --   --   --   --   --   --   --  0.25* 0.20*  CREATININE 1.26*  --  1.08  --  1.28* 1.48*  --  1.42*  --   --   TROPONINIHS 195*  --  4,374*  --   --   --   --   --   --   --    < > = values in this interval not displayed.    Estimated Creatinine Clearance: 64.7 mL/min (A) (by C-G formula based on SCr of 1.42 mg/dL (H)).   Medical History: Past Medical History:  Diagnosis Date   HTN (hypertension)    Hypercholesteremia    Sleep apnea      Assessment: 65 yo male admitted with VF arrest and STEMI c/b cardiogenic shock requiring Impella CP placement. Pharmacy is consulted to dose heparin.  ACT 154 on 1/12 @ 2211 and cangrelor is now off. Impella purge flow at 16.3 mL/hr (815 units/hr), purge pressures in the 400s. Heparin level is therapeutic at 0.20 but has trended down from 0.25. Systemic heparin has been running at 100 units/hr. The RN stated the patient is without signs or symptoms of bleeding. CBC is WNL and stable.  Goal of Therapy:  Heparin level 0.2-0.5 units/ml Monitor  platelets by anticoagulation protocol: Yes   Plan:  -Increase systemic IV heparin infusion to 150 units/hr -Continue heparinized purge solution -Obtain a 6 hour heparin level -Monitor daily HL, CBC, LDH, and for s/sx of bleeding    2212, PharmD, RPh  PGY-1 Pharmacy Resident 02/16/2020 1:41 PM  Please check [email or web address] for unit-specific pharmacy phone numbers.

## 2020-02-17 ENCOUNTER — Inpatient Hospital Stay (HOSPITAL_COMMUNITY): Payer: BC Managed Care – PPO

## 2020-02-17 DIAGNOSIS — I610 Nontraumatic intracerebral hemorrhage in hemisphere, subcortical: Secondary | ICD-10-CM

## 2020-02-17 DIAGNOSIS — I469 Cardiac arrest, cause unspecified: Secondary | ICD-10-CM

## 2020-02-17 LAB — POCT I-STAT 7, (LYTES, BLD GAS, ICA,H+H)
Acid-base deficit: 10 mmol/L — ABNORMAL HIGH (ref 0.0–2.0)
Acid-base deficit: 1 mmol/L (ref 0.0–2.0)
Bicarbonate: 18.1 mmol/L — ABNORMAL LOW (ref 20.0–28.0)
Bicarbonate: 25 mmol/L (ref 20.0–28.0)
Calcium, Ion: 1.25 mmol/L (ref 1.15–1.40)
Calcium, Ion: 1.17 mmol/L (ref 1.15–1.40)
HCT: 44 % (ref 39.0–52.0)
HCT: 30 % — ABNORMAL LOW (ref 39.0–52.0)
Hemoglobin: 15 g/dL (ref 13.0–17.0)
Hemoglobin: 10.2 g/dL — ABNORMAL LOW (ref 13.0–17.0)
O2 Saturation: 80 %
O2 Saturation: 90 %
Patient temperature: 38
Potassium: 3.4 mmol/L — ABNORMAL LOW (ref 3.5–5.1)
Potassium: 3.3 mmol/L — ABNORMAL LOW (ref 3.5–5.1)
Sodium: 138 mmol/L (ref 135–145)
Sodium: 138 mmol/L (ref 135–145)
TCO2: 20 mmol/L — ABNORMAL LOW (ref 22–32)
TCO2: 26 mmol/L (ref 22–32)
pCO2 arterial: 47.7 mmHg (ref 32.0–48.0)
pCO2 arterial: 47.3 mmHg (ref 32.0–48.0)
pH, Arterial: 7.187 — CL (ref 7.350–7.450)
pH, Arterial: 7.336 — ABNORMAL LOW (ref 7.350–7.450)
pO2, Arterial: 55 mmHg — ABNORMAL LOW (ref 83.0–108.0)
pO2, Arterial: 67 mmHg — ABNORMAL LOW (ref 83.0–108.0)

## 2020-02-17 LAB — CBC
HCT: 31.3 % — ABNORMAL LOW (ref 39.0–52.0)
Hemoglobin: 10.1 g/dL — ABNORMAL LOW (ref 13.0–17.0)
MCH: 28.6 pg (ref 26.0–34.0)
MCHC: 32.3 g/dL (ref 30.0–36.0)
MCV: 88.7 fL (ref 80.0–100.0)
Platelets: 190 10*3/uL (ref 150–400)
RBC: 3.53 MIL/uL — ABNORMAL LOW (ref 4.22–5.81)
RDW: 13.9 % (ref 11.5–15.5)
WBC: 11.5 10*3/uL — ABNORMAL HIGH (ref 4.0–10.5)
nRBC: 0 % (ref 0.0–0.2)

## 2020-02-17 LAB — BASIC METABOLIC PANEL
Anion gap: 11 (ref 5–15)
Anion gap: 9 (ref 5–15)
BUN: 21 mg/dL (ref 8–23)
BUN: 22 mg/dL (ref 8–23)
CO2: 23 mmol/L (ref 22–32)
CO2: 24 mmol/L (ref 22–32)
Calcium: 7.8 mg/dL — ABNORMAL LOW (ref 8.9–10.3)
Calcium: 8.1 mg/dL — ABNORMAL LOW (ref 8.9–10.3)
Chloride: 103 mmol/L (ref 98–111)
Chloride: 101 mmol/L (ref 98–111)
Creatinine, Ser: 1.28 mg/dL — ABNORMAL HIGH (ref 0.61–1.24)
Creatinine, Ser: 1.08 mg/dL (ref 0.61–1.24)
GFR, Estimated: >60 mL/min (ref >60)
GFR, Estimated: >60 mL/min (ref >60)
Glucose, Bld: 239 mg/dL — ABNORMAL HIGH (ref 70–99)
Glucose, Bld: 196 mg/dL — ABNORMAL HIGH (ref 70–99)
Potassium: 3.3 mmol/L — ABNORMAL LOW (ref 3.5–5.1)
Potassium: 4 mmol/L (ref 3.5–5.1)
Sodium: 137 mmol/L (ref 135–145)
Sodium: 134 mmol/L — ABNORMAL LOW (ref 135–145)

## 2020-02-17 LAB — GLUCOSE, CAPILLARY
Glucose-Capillary: 170 mg/dL — ABNORMAL HIGH (ref 70–99)
Glucose-Capillary: 182 mg/dL — ABNORMAL HIGH (ref 70–99)
Glucose-Capillary: 174 mg/dL — ABNORMAL HIGH (ref 70–99)
Glucose-Capillary: 167 mg/dL — ABNORMAL HIGH (ref 70–99)
Glucose-Capillary: 159 mg/dL — ABNORMAL HIGH (ref 70–99)
Glucose-Capillary: 140 mg/dL — ABNORMAL HIGH (ref 70–99)

## 2020-02-17 LAB — POCT I-STAT, CHEM 8
BUN: 24 mg/dL — ABNORMAL HIGH (ref 8–23)
Calcium, Ion: 1.3 mmol/L (ref 1.15–1.40)
Chloride: 104 mmol/L (ref 98–111)
Creatinine, Ser: 1.1 mg/dL (ref 0.61–1.24)
Glucose, Bld: 339 mg/dL — ABNORMAL HIGH (ref 70–99)
HCT: 45 % (ref 39.0–52.0)
Hemoglobin: 15.3 g/dL (ref 13.0–17.0)
Potassium: 3.4 mmol/L — ABNORMAL LOW (ref 3.5–5.1)
Sodium: 137 mmol/L (ref 135–145)
TCO2: 20 mmol/L — ABNORMAL LOW (ref 22–32)

## 2020-02-17 LAB — COOXEMETRY PANEL
Carboxyhemoglobin: 0.8 % (ref 0.5–1.5)
Methemoglobin: 1.1 % (ref 0.0–1.5)
O2 Saturation: 70.6 %
Total hemoglobin: 10.6 g/dL — ABNORMAL LOW (ref 12.0–16.0)

## 2020-02-17 LAB — PROCALCITONIN: Procalcitonin: 6 ng/mL

## 2020-02-17 LAB — PHOSPHORUS
Phosphorus: 2.3 mg/dL — ABNORMAL LOW (ref 2.5–4.6)
Phosphorus: 2.1 mg/dL — ABNORMAL LOW (ref 2.5–4.6)

## 2020-02-17 LAB — LACTATE DEHYDROGENASE: LDH: 514 U/L — ABNORMAL HIGH (ref 98–192)

## 2020-02-17 LAB — MAGNESIUM
Magnesium: 2.2 mg/dL (ref 1.7–2.4)
Magnesium: 1.8 mg/dL (ref 1.7–2.4)

## 2020-02-17 LAB — HEPARIN LEVEL (UNFRACTIONATED): Heparin Unfractionated: <0.1 IU/mL — ABNORMAL LOW (ref 0.30–0.70)

## 2020-02-17 LAB — POCT ACTIVATED CLOTTING TIME: Activated Clotting Time: 160 seconds

## 2020-02-17 MED ORDER — ACETAMINOPHEN 160 MG/5ML PO SOLN
650.0000 mg | ORAL | Status: DC | PRN
  Administered 2020-02-19 – 2020-02-27 (×7): 650 mg
  Filled 2020-02-17 (×7): qty 20.3

## 2020-02-17 MED ORDER — STROKE: EARLY STAGES OF RECOVERY BOOK
Freq: Once | Status: AC
  Filled 2020-02-17: qty 1

## 2020-02-17 MED ORDER — SENNOSIDES-DOCUSATE SODIUM 8.6-50 MG PO TABS
1.0000 | ORAL_TABLET | Freq: Two times a day (BID) | ORAL | Status: DC
  Administered 2020-02-17: 1 via ORAL
  Filled 2020-02-17: qty 1

## 2020-02-17 MED ORDER — ACETAMINOPHEN 650 MG RE SUPP
650.0000 mg | RECTAL | Status: DC | PRN
  Administered 2020-02-18: 650 mg via RECTAL
  Filled 2020-02-17: qty 1

## 2020-02-17 MED ORDER — POTASSIUM CHLORIDE 10 MEQ/50ML IV SOLN
10.0000 meq | INTRAVENOUS | Status: AC
  Administered 2020-02-17 (×4): 10 meq via INTRAVENOUS
  Filled 2020-02-17 (×4): qty 50

## 2020-02-17 MED ORDER — ACETAMINOPHEN 325 MG PO TABS: 650.0000 mg | ORAL_TABLET | ORAL | Status: DC | PRN

## 2020-02-17 MED ORDER — ACETAMINOPHEN 160 MG/5ML PO SOLN: 650.0000 mg | ORAL | Status: DC | PRN

## 2020-02-17 MED ORDER — FUROSEMIDE 10 MG/ML IJ SOLN
40.0000 mg | Freq: Once | INTRAMUSCULAR | Status: AC
  Administered 2020-02-17: 40 mg via INTRAVENOUS
  Filled 2020-02-17: qty 4

## 2020-02-17 MED ORDER — CLOPIDOGREL BISULFATE 75 MG PO TABS
75.0000 mg | ORAL_TABLET | Freq: Every day | ORAL | Status: DC
  Administered 2020-02-18 – 2020-02-20 (×3): 75 mg
  Filled 2020-02-17 (×3): qty 1

## 2020-02-17 MED ORDER — SENNOSIDES-DOCUSATE SODIUM 8.6-50 MG PO TABS
1.0000 | ORAL_TABLET | Freq: Two times a day (BID) | ORAL | Status: DC
  Administered 2020-02-18 – 2020-02-21 (×8): 1
  Filled 2020-02-17 (×8): qty 1

## 2020-02-17 MED ORDER — ACETAMINOPHEN 325 MG PO TABS
650.0000 mg | ORAL_TABLET | ORAL | Status: DC | PRN
  Administered 2020-02-21 (×2): 650 mg
  Filled 2020-02-17 (×2): qty 2

## 2020-02-17 MED ORDER — CLOPIDOGREL BISULFATE 75 MG PO TABS
75.0000 mg | ORAL_TABLET | Freq: Every day | ORAL | Status: DC
  Administered 2020-02-17: 75 mg via ORAL
  Filled 2020-02-17: qty 1

## 2020-02-17 MED ORDER — ACETAMINOPHEN 650 MG RE SUPP: 650.0000 mg | RECTAL | Status: DC | PRN

## 2020-02-17 MED ORDER — AMIODARONE IV BOLUS ONLY 150 MG/100ML
150.0000 mg | Freq: Once | INTRAVENOUS | Status: AC
  Administered 2020-02-17: 150 mg via INTRAVENOUS

## 2020-02-17 NOTE — Progress Notes (Signed)
NAME:  Jeffrey Morse, MRN:  [id number], DOB:  [birth year], LOS: 2 ADMISSION DATE:  02/15/2020, CONSULTATION DATE:  02/15/20 REFERRING MD:  Junious Dresser  CHIEF COMPLAINT:  VF arrest   Brief History   Jeffrey Morse is a 65 y.o. male who was brought to [facility] ED 1/12 with VT arrest after he collapsed at [facility] (had gone there for chest pain).  He had multiple recurrent arrests before being taken to cath lab where he was found to have severe LV dysfunction with EF 25-35%, and 100% prox Cx stenosis.  He had DES and Impella placed and was then transferred to the ICU where PCCM was asked to assist with vent management.  History of present illness   Pt is encephelopathic; therefore, this HPI is obtained from chart review. Jeffrey Morse is a 65 y.o. male who has unknown PMH.  He was going to [facility] 1/12 for chest pain and before he got inside, he collapsed and went unresponsive.  Family was with him and began CPR while awaiting EMS.  On EMS arrival, he was found to have VT.  He was shocked once and had ROSC; however, he proceeded to have multiple recurrent arrests.  He was brought to [facility] and taken straight to cath lab where he was found to have severe LV dysfunction EF 25 - 35%, Ost LAD 40% stenosed, Dist LAD 90% stenosed, 1st Marg lesion 90% stenosed, Ost Cx 40% stenosed, Prox Cx 100% stenosed.   He had DES and Impella placed and was transferred to the ICU where PCCM was asked to assist with vent management.  He is currently on 89mcg/min levophed.  Past Medical History  Unknown. has Cardiogenic shock (HCC); Cardiac arrest [facility]); ST elevation myocardial infarction (STEMI) (HCC); Encounter for imaging study to confirm orogastric (OG) tube placement; and Acute respiratory failure with hypoxia (HCC) on their problem list.  Significant Hospital Events   1/12 > admit. 1/14 > brief (2 min) VT arrest  Consults:  Cards, PCCM.  Procedures:  ETT 1/12 >  Impella 1/12 >  Significant Diagnostic Tests:  Cath lab 1/12 >  Severe LV dysfunction EF 25 - 35%, Ost LAD 40% stenosed, Dist LAD 90% stenosed, 1st Marg lesion 90% stenosed, Ost Cx 40% stenosed, Prox Cx 100% stenosed.  DES placed, Impella placed. Echo 1/13 >  1. Left ventricular ejection fraction, by estimation, is 50 to 55%. The  left ventricle has mildly decreased function. The left ventricle  demonstrates regional wall motion abnormalities. Posterior wall  hypokinesis.  2. Impella measures about 3cm into LV cavity from aortic valve  3. Right ventricular systolic function is normal. The right ventricular  size is normal.   Micro Data:  Flu 1/12 > negative. COVID 1/12 > negative.  Antimicrobials:  Ceftriaxone 1/13>>  Interim history/subjective:  Brief arrest last night. Awake, following commands this am.  Objective:  Blood pressure 105/60, pulse (!) 58, temperature 99.5 F (37.5 C), resp. rate (!) 24, height 5\' 10"  (1.778 m), weight 106.7 kg, SpO2 99 %. PAP: (22-37)/(11-27) 27/15 CVP:  [0 mmHg-16 mmHg] 8 mmHg CO:  [5.4 L/min-7.5 L/min] 5.8 L/min CI:  [2.5 L/min/m2-3.5 L/min/m2] 2.7 L/min/m2  Vent Mode: PRVC FiO2 (%):  [50 %-80 %] 70 % Set Rate:  [24 bmp] 24 bmp Vt Set:  [560 mL] 560 mL PEEP:  [12 cmH20] 12 cmH20 Plateau Pressure:  [20 cmH20-23 cmH20] 20 cmH20   Intake/Output Summary (Last 24 hours) at 02/17/2020 0859 Last data filed at 02/17/2020 0800 Gross  per 24 hour  Intake 3003.41 ml  Output 2345 ml  Net 658.41 ml   Filed Weights   02/15/20 1437 02/16/20 0600 02/17/20 0615  Weight: 96 kg 105.2 kg 106.7 kg    Examination: Constitutional: no acute distress lying in bed  Eyes: upward gaze preference, L>R pupil but both briskly reactive to light Ears, nose, mouth, and throat: thick purulent secretions from ETT Cardiovascular: RRR, impella hum, ext warm Respiratory: scattered rhonci, triggering vent, strong cough Gastrointestinal: soft, +BS Skin: No rashes, normal turgor Neurologic: moves BL LE briskly to command, moves RUE  briskly to command, LUE withdraws to pain but not following command Psychiatric: RASS -1  Sugars okay SvO2 70% Cr okay Pct 6 WBC 11   Assessment & Plan:  Acute hypoxic resp failure post cardiac arrest- CXR now clear OOH Vtach cardiac arrest- suspect related to prox L circ occlusion post DES Cardiogenic shock after arrest- post impella, improving, CHF team following Likely CAP vs. Aspiration PNA- on ceftriaxone IH Vtach cardiac arrest- brief, 1/14, unclear precipitant ?hypokalemia Mild hyperglcemia Hypokalemia  - check trach aspirate, trend Pct, 7 days ceftriaxone in absence of other data - impella removal potentially today then work on vent wean - wean pressors as able after impella removal - antiplatelets, GDT, AC, antiarrythmics per CHF team - SSI - VAP prevention bundle - his neuro exam is encouraging  Best Practice (evaluated daily):  Diet: NPO pending impella removal Pain/Anxiety/Delirium protocol (if indicated): weaning post impella removal VAP protocol (if indicated): In place. DVT prophylaxis: heparin gtt GI prophylaxis: PPI. Glucose control: Insulin ssi Mobility: Bedrest. Disposition: ICU.  Goals of Care:  Last date of multidisciplinary goals of care discussion:per primary Family and staff present: None. Summary of discussion: None. Follow up goals of care discussion due: 1/18. Code Status:  Full.   Patient critically ill due to respiratory failure, cardiogenic shock Interventions to address this today ventilator and sedation titration Risk of deterioration without these interventions is high  I personally spent 34 minutes providing critical care not including any separately billable procedures  Myrla Halsted MD [facility] 02/17/2020 9:17 AM Personal pager: [phone] If unanswered, please page CCM On-call: #[phone]

## 2020-02-17 NOTE — Progress Notes (Signed)
  Impella removal note  Heparin held.  ACT < 150. Cardiac output ok on low pump support.    Sutures removed. Impella turned down to P-1 and pulled across the aortic valve. Impella then turned off and device pulled down to sheath. Sheath and device pulled out and manual pressure held personally for 40 minutes with good hemostasis. Groin level 0. Fem stop applied.   Arvilla Meres, MD  1:19 PM

## 2020-02-17 NOTE — Significant Event (Signed)
Cardiology cross-cover Called to assess pt due to Code Blue cardiac arrest. Nursing staff say that pt was doing fine, and they were preparing to reposition him, when he suddenly had spontaneous pulseless VT arrest. CPR initiated w/ ROSC within 3 min; pt received one shock and one dose epi, as well as bicarb and Magnesium. He is now stable; he does have IABP. He is on multiple presssors/inotropes including milrinone gtt, and is was already on amiodarone gtt prior to code. Current VS 111/46 via A-line, HR 96 (sinus), O2 sat 88%. No changes on EKG to suggest acute ischemia. Notes indicate primary team in process of weaning milrinone; currently on 0.12mcg. given VT, will go ahead and stop it.  EKG post-code shows QTc (it was this AM), ~75mm ST depression in the precordium but no ST elevation. Will cont amiodarone for now and monitor. Avoid any additional agents that will prolong QTc. Cont NE, VP; can up-titrate as necessary to support pressure. Cont current settings IABP.  Repeat labs pending; keep K>4, Mag>2. Will cont to monitor for any change. Thank you for the opportunity to participate in the care of this patient.  For questions or updates, please contact [facility] Please consult [email or web address] for contact info under   Precious Reel, MD , [facility] 02/17/20 1:22 AM

## 2020-02-17 NOTE — Code Documentation (Addendum)
CODE BLUE Documentation   Responded to Code Blue  Was provider in vicinity   Loss of pulses 1242  Monomorphic VT  CPR initiated immediately, 1 amp epi pushed, pads applied, 1x DF, compressions resumed. ROSC 1244  2g mag ordered IVPB  Cardiology paged to bedside by ELink   Continues on Ipella, milrinone, amio, vaso, levo  STAT BMP + iCal ordered  Tessie Fass MSN, AGACNP-BC [facility]/Critical Care Medicine 02/17/2020, 12:53 AM

## 2020-02-17 NOTE — Care Plan (Signed)
At bedside, VT rhythm noted on monitor, No pulse, CPR started, (see CPR record). ROSC achieved after Impella placed at P2 during code and returned to P4 after Dr. Gala Romney notified, orders received, will continue to monitor pt.

## 2020-02-17 NOTE — Progress Notes (Signed)
  Patient with progressive left-sided weakness UE>LE.   Stat head CT shows small thalamic bleed. 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 Have d/w Neuro at bedside.  Will plan MRI/MRA.   Switch Brilinta to Plavix.  Tight BP control  SBP 110-140.   Family updated.   Critical care time additional 35 mins.   Arvilla Meres, MD  7:10 PM

## 2020-02-17 NOTE — Progress Notes (Signed)
RT note- Patient transported to CT and now back in 2H-22

## 2020-02-17 NOTE — Progress Notes (Signed)
Family Communication   I updated the patient's wife and son regarding VT arrest with ROSC. All questions answered.   Tessie Fass MSN, AGACNP-BC [facility] Pulmonary/Critical Care Medicine 02/17/2020, 1:26 AM

## 2020-02-17 NOTE — Plan of Care (Signed)
  Problem: Respiratory: Goal: Ability to maintain a clear airway and adequate ventilation will improve Outcome: Progressing   Problem: Cardiovascular: Goal: Ability to achieve and maintain adequate cardiovascular perfusion will improve Outcome: Progressing Goal: Vascular access site(s) Level 0-1 will be maintained Outcome: Progressing   Problem: Clinical Measurements: Goal: Ability to maintain clinical measurements within normal limits will improve Outcome: Progressing Goal: Will remain free from infection Outcome: Progressing Goal: Diagnostic test results will improve Outcome: Progressing Goal: Respiratory complications will improve Outcome: Progressing Goal: Cardiovascular complication will be avoided Outcome: Progressing   Problem: Pain Managment: Goal: General experience of comfort will improve Outcome: Progressing   Problem: Safety: Goal: Ability to remain free from injury will improve Outcome: Progressing   Problem: Skin Integrity: Goal: Risk for impaired skin integrity will decrease Outcome: Progressing

## 2020-02-17 NOTE — Progress Notes (Addendum)
Advanced Heart Failure Rounding Note   Subjective:    1/12 admitted for Vfib arrest, cath PCI/DES and impella 1/14 VT arrest, ROSC after 3 minutes.   Remains intubated but will move spontaneously. Wife present at [facility].  Updated her and family on phone.    PCT 8.5. On ceftriaxone for possible aspiration PNA  Now on VP 0.03, NE 17, off milrinone. Impella P4.   Echo EF ~50-55% LV small RV ok.  Swan #s  CVP 7 PA 24/12 (17) Thermo 5.82/2.72 SVR 949  Objective:   Weight Range:  Vital Signs:   Temp:  [99.1 F (37.3 C)-100.4 F (38 C)] 99.3 F (37.4 C) (01/14 0700) Pulse Rate:  [57-127] 60 (01/14 0700) Resp:  [16-24] 24 (01/14 0700) BP: (104-114)/(62-69) 114/68 (01/13 1500) SpO2:  [89 %-100 %] 100 % (01/14 0700) Arterial Line BP: (77-160)/(50-93) 102/58 (01/14 0700) FiO2 (%):  [50 %-100 %] 80 % (01/14 0340) Weight:  [106.7 kg] 106.7 kg (01/14 0615) Last BM Date:  (UTA)  Weight change: Filed Weights   02/15/20 1437 02/16/20 0600 02/17/20 0615  Weight: 96 kg 105.2 kg 106.7 kg    Intake/Output:   Intake/Output Summary (Last 24 hours) at 02/17/2020 0710 Last data filed at 02/17/2020 0700 Gross per 24 hour  Intake 3033.07 ml  Output 2465 ml  Net 568.07 ml     Physical Exam: CVP 7 General:  Intubated, blinks and grimaces to care HEENT: normal Neck: supple. no JVD.  Cor: Regular rate & rhythm. No rubs, gallops or murmurs. Lungs: Clear upper lobes Abdomen: soft, nontender, and mildly distended. Hypoactive BS. Extremities: no cyanosis, clubbing, rash, edema Neuro: Intubated on sedation but grimaces to suctioning and blinks  Telemetry: SR rates 50-60.  Monomorphic VT early this morning.  Personally reviewed.   Labs: Basic Metabolic Panel: Recent Labs  Lab 02/15/20 1523 02/15/20 1658 02/15/20 1923 02/15/20 2133 02/16/20 0323 02/16/20 1618 02/17/20 0116 02/17/20 0118  NA 138   < > 136 137 135 137 138 137  K 3.2*   < > 4.2 4.0 4.6 4.0 3.3* 3.3*  CL 103   --  108 107 105 104  --  103  CO2 22  --  17* 20* 20* 23  --  23  GLUCOSE 333*  --  239* 266* 234* 164*  --  239*  BUN 19  --  21 22 24* 22  --  21  CREATININE 1.08  --  1.28* 1.48* 1.42* 1.15  --  1.28*  CALCIUM 8.1*  --  8.2* 8.1* 8.0* 7.7*  --  7.8*  MG 1.7  --  2.0  --   --  1.7  --   --   PHOS  --   --   --   --   --  3.2  --   --    < > = values in this interval not displayed.    Liver Function Tests: Recent Labs  Lab 02/15/20 1114 02/16/20 0323  AST 73* 175*  ALT 54* 53*  ALKPHOS 91 67  BILITOT 0.8 0.6  PROT 6.1* 5.2*  ALBUMIN 3.5 2.9*   No results for input(s): LIPASE, AMYLASE in the last 168 hours. No results for input(s): AMMONIA in the last 168 hours.  CBC: Recent Labs  Lab 02/15/20 1114 02/15/20 1216 02/15/20 2250 02/16/20 0323 02/16/20 1618 02/17/20 0116 02/17/20 0449  WBC 14.2*  --  19.2* 18.5* 11.3*  --  11.5*  NEUTROABS 7.8*  --   --   --   --   --   --  HGB 14.4   < > 12.8* 11.9* 11.2* 10.2* 10.1*  HCT 45.7   < > 39.2 36.3* 32.5* 30.0* 31.3*  MCV 90.7  --  88.3 87.9 86.7  --  88.7  PLT 280  --  311 257 212  --  190   < > = values in this interval not displayed.    Cardiac Enzymes: No results for input(s): CKTOTAL, CKMB, CKMBINDEX, TROPONINI in the last 168 hours.  BNP: BNP (last 3 results) No results for input(s): BNP in the last 8760 hours.  ProBNP (last 3 results) No results for input(s): PROBNP in the last 8760 hours.    Other results:  Imaging: CARDIAC CATHETERIZATION  Result Date: 02/15/2020  There is severe left ventricular systolic dysfunction.  LV end diastolic pressure is moderately elevated.  The left ventricular ejection fraction is 25-35% by visual estimate.  Ost LAD lesion is 40% stenosed.  Dist LAD lesion is 90% stenosed.  1st Mrg lesion is 90% stenosed.  Ost Cx lesion is 40% stenosed.  Prox Cx lesion is 100% stenosed.  Post intervention, there is a 0% residual stenosis.  A drug-eluting stent was successfully  placed using a STENT RESOLUTE ONYX 3.0X26.  1.  Out of hospital cardiac arrest and cardiogenic shock due to occluded mid left circumflex.  The left circumflex and LAD has 2 separate ostia.  There is moderate ostial LAD disease and significant disease in the distal LAD close to the apex.  No significant disease affecting the right coronary artery. 2.  Severely reduced LV systolic function with an EF of 25%. 3.  Moderately to severely elevated left ventricular end-diastolic pressure at the beginning of the case with an LVEDP of 28 mmHg. 4.  Successful Impella device placement via the right common femoral artery for cardiogenic shock. 5.  Successful angioplasty and drug-eluting stent placement to the left circumflex 6.  Successful right heart catheterization via the right internal jugular vein.  Right heart catheterization at end of the case showed mildly elevated filling pressures, mild pulmonary hypertension and normal cardiac output.  Recommendations: The patient was started on cangrelor drip which will be continued for 4 hours after loading dose of Brilinta is given. Continue heparin drip for Impella Further management of cardiogenic shock per Dr. Haroldine Laws The patient will require dual antiplatelet therapy for at least 12 months.   DG CHEST PORT 1 VIEW  Result Date: 02/16/2020 CLINICAL DATA:  64 year old male with heart failure EXAM: PORTABLE CHEST 1 VIEW COMPARISON:  02/15/2020 FINDINGS: Cardiomediastinal silhouette unchanged size and contour. Endotracheal tube is unchanged, terminating 5.8 cm above the carina. Unchanged gastric tube projecting over the mediastinum and terminating in the abdomen out of the field of view. Unchanged right IJ sheath transmitting Swan-Ganz catheter, with the tip terminating over the right hilum. Overlying EKG leads. Low lung volumes persist with patchy hilar opacities, and coarsened interstitial markings. No pneumothorax or pleural effusion. No significant interlobular septal  thickening. IMPRESSION: Low lung volumes persist, with likely atelectasis at the lung bases and no overt edema or confluent airspace disease. Unchanged position of endotracheal tube, gastric tube, right IJ sheath/Swan-Ganz. Electronically Signed   By: Corrie Mckusick D.O.   On: 02/16/2020 09:21   Portable Chest x-ray  Result Date: 02/15/2020 CLINICAL DATA:  Endotracheal tube and Swan-Ganz catheter placement. EXAM: PORTABLE CHEST 1 VIEW COMPARISON:  None. FINDINGS: Endotracheal tube tip 4.4 cm from the carina at the level of the clavicular heads. Right internal jugular Swan-Ganz catheter tip in the  region of the main pulmonary outflow tract. Enteric tube is in place with tip below the diaphragm not included in the field of view. Impella device in place. Heart is normal in size with normal mediastinal contours. Vascular congestion. Retrocardiac left lung base atelectasis. Ill-defined patchy opacity in the right upper lobe. No pneumothorax or large pleural effusion. IMPRESSION: 1. Endotracheal tube tip 4.4 cm from the carina at the level of the clavicular heads. Swan-Ganz catheter tip in the region of the main pulmonary outflow tract. Impella device in place. 2. Vascular congestion. Ill-defined patchy opacity in the right upper lobe may be atelectasis, pneumonia, or asymmetric pulmonary edema. Electronically Signed   By: Keith Rake M.D.   On: 02/15/2020 16:14   DG Abd Portable 1V  Result Date: 02/15/2020 CLINICAL DATA:  OG tube placement. EXAM: PORTABLE ABDOMEN - 1 VIEW COMPARISON:  None. FINDINGS: Tip and side port of the enteric tube below the diaphragm in the stomach. Nonobstructive bowel gas pattern in the upper abdomen. Excreted IV contrast in the renal collecting systems. IMPRESSION: Tip and side port of the enteric tube below the diaphragm in the stomach. Electronically Signed   By: Keith Rake M.D.   On: 02/15/2020 16:15   ECHOCARDIOGRAM LIMITED  Result Date: 02/16/2020    ECHOCARDIOGRAM  LIMITED REPORT   Patient Name:   Jeffrey Morse Date of Exam: 02/16/2020 Medical Rec #:  [id number]        Height:       70.0 in Accession #:    [id number]       Weight:       231.9 lb Date of Birth:  [birth year]        BSA:          2.223 m Patient Age:    25 years         BP:           104/62 mmHg Patient Gender: M                HR:           69 bpm. Exam Location:  Inpatient Procedure: Limited Color Doppler and Limited Echo Indications:    Cardiac arrest I46.9  History:        Patient has prior history of Echocardiogram examinations, most                 recent 02/15/2020. Risk Factors:Hypertension and Dyslipidemia.                 Cardiac arrest.  Sonographer:    Darlina Sicilian RDCS Referring Phys: [place]  1. Left ventricular ejection fraction, by estimation, is 50 to 55%. The left ventricle has mildly decreased function. The left ventricle demonstrates regional wall motion abnormalities. Posterior wall hypokinesis.  2. Impella measures about 3cm into LV cavity from aortic valve  3. Right ventricular systolic function is normal. The right ventricular size is normal. FINDINGS  Left Ventricle: Left ventricular ejection fraction, by estimation, is 50 to 55%. The left ventricle has low normal function. The left ventricle demonstrates regional wall motion abnormalities. Right Ventricle: The right ventricular size is normal. Right ventricular systolic function is normal. Oswaldo Milian MD Electronically signed by Oswaldo Milian MD Signature Date/Time: 02/16/2020/9:44:38 AM    Final    ECHOCARDIOGRAM LIMITED  Result Date: 02/15/2020    ECHOCARDIOGRAM LIMITED REPORT   Patient Name:   Jeffrey Morse Date of Exam: 02/15/2020 Medical Rec #:  [id number]        Height:       70.0 in Accession #:    [id number]       Weight:       211.6 lb Date of Birth:  July 22, 1955        BSA:          2.138 m Patient Age:    65 years         BP:           107/92 mmHg Patient Gender: M                 HR:           62 bpm. Exam Location:  Inpatient Procedure: Limited Echo and Limited Color Doppler Indications:    Cardiac Arrest I46.9  History:        Patient has no prior history of Echocardiogram examinations.  Sonographer:    Tiffany Dance Referring Phys: [id number] A ARIDA  Sonographer Comments: Technically difficult study due to poor echo windows and echo performed with patient supine and on artificial respirator. IMPRESSIONS  1. Non diagnostic only para sternal images. Impella device suitable distance distal to AV annulus No pericardial effusion Posterior lateral wall appears hypokinetic No color flow or doppler performed. FINDINGS  Additional Comments: Non diagnostic only para sternal images. Impella device suitable distance distal to AV annulus No pericardial effusion Posterior lateral wall appears hypokinetic No color flow or doppler performed. Jenkins Rouge MD Electronically signed by Jenkins Rouge MD Signature Date/Time: 02/15/2020/4:05:49 PM    Final      Medications:     Scheduled Medications: . aspirin  81 mg Per Tube Daily  . atorvastatin  80 mg Per Tube q1800  . chlorhexidine gluconate (MEDLINE KIT)  15 mL Mouth Rinse BID  . Chlorhexidine Gluconate Cloth  6 each Topical Daily  . docusate  100 mg Per Tube BID  . feeding supplement (PROSource TF)  90 mL Per Tube TID  . fentaNYL (SUBLIMAZE) injection  50 mcg Intravenous Once  . insulin aspart  0-15 Units Subcutaneous Q4H  . mouth rinse  15 mL Mouth Rinse 10 times per day  . pantoprazole (PROTONIX) IV  40 mg Intravenous Daily  . polyethylene glycol  17 g Per Tube Daily  . sodium chloride flush  3 mL Intravenous Q12H  . ticagrelor  90 mg Per Tube BID    Infusions: . sodium chloride    . amiodarone 30 mg/hr (02/17/20 0700)  . cefTRIAXone (ROCEPHIN)  IV Stopped (02/16/20 1144)  . epinephrine Stopped (02/15/20 1323)  . feeding supplement (VITAL 1.5 CAL) Stopped (02/17/20 0100)  . fentaNYL infusion INTRAVENOUS 125 mcg/hr  (02/17/20 0700)  . heparin 400 Units/hr (02/17/20 0700)  . impella catheter heparin 50 unit/mL in dextrose 5%    . lactated ringers Stopped (02/17/20 9449)  . midazolam Stopped (02/16/20 1226)  . norepinephrine (LEVOPHED) Adult infusion 17 mcg/min (02/17/20 0700)  . potassium chloride 50 mL/hr at 02/17/20 0700  . vasopressin 0.03 Units/min (02/17/20 0700)    PRN Medications: sodium chloride, sodium chloride, dextrose, fentaNYL, midazolam, ondansetron (ZOFRAN) IV, sodium chloride flush   Assessment/Plan:   1. Cardiac arrest in setting of acute inferolateral STEMI/VT - OOH cardiac arrest on 1/12 with prolonged CPR, resuscitated w/ CPR + defibrillation   - Emergent cath 1/12 w/ totally occluded mid LCx treated w/ PCI + DES  2. Acute systolic HF due in setting of acute MI -> cardiogenic shock -  EF 25% in cath lab 1/12 - Impella CP in place, P4 - Limited bedside echo (1/13) EF 50-55% - Co-ox 71% - Off milrinone. Continue NE and VP.   3. CAD with acute inferolateral  STEMI  - Emergent cath 1/12 w/ totally occluded mid LCx treated w/ PCI + DES - There is moderate ostial LAD disease and significant disease in the distal LAD close to the apex. No significant disease affecting the right coronary artery. - Continue heparin gtt  - DAPT, ASA + Brilinta   4. VT  -VT arrest 1/14 - keep K > 4.0 MG > 2.0  5. Acute hypoxic respiratory failure in setting of cardiac arrest - CXR atelectasis, CPT 8.54-->6; FiO2 70% - On ceftriaxone (D2) - CCM following  6. Hypokalemia/hypomagnesemia - Replete PRN  7. AKI - due to ATN, Scr peak 1.48, Today 1.28 - Continue hemodynamic support  8. DM2 - HgbA1c 6.0, beta hydroxybutyric acid 0.18 -SSI   Length of Stay: 2   Carlene Coria NP 02/17/2020, 7:10 AM  Advanced Heart Failure Team Pager [phone] (M-F; [place])  Please contact [facility] for night-coverage after hours (4p -7a ) and weekends on [email or web address]  Agree with above  Had VT  arrest last night and shocked. More awake today. Will follow some commands. Seems stronger on R. Remains on Impella at 4. NE down to 17. VP 0.03. Off milrinone. Swan #s ok. SBP 130.   QTC improved. Remains on IV amio. Vent at 60%  General:  Awake on vent HEENT: normal + ETT Neck: supple. RIJ swan Carotids 2+ bilat; no bruits. No lymphadenopathy or thryomegaly appreciated. Cor: PMI nondisplaced. Regular rate & rhythm. No rubs, gallops or murmurs. Lungs: clear Abdomen: soft, nontender, nondistended. No hepatosplenomegaly. No bruits or masses. Good bowel sounds. Extremities: no cyanosis, clubbing, rash, edema + RFA impella Neuro: awake on vent. Following some commands  Hemodynamically improved. EF 50%. Will wean Impella today. Give 1 dose IV lasix. Once Impella out can wean sedation and reassess mental status. If any localizing findings will send for head CT (had fall with initial cardiac arrest). Continue ceftriaxone for aspiration PNA.   Wean vent as tolerated. Follow electrolytes closely.   CRITICAL CARE Performed by: Glori Bickers  Total critical care time: 45 minutes  Critical care time was exclusive of separately billable procedures and treating other patients.  Critical care was necessary to treat or prevent imminent or life-threatening deterioration.  Critical care was time spent personally by me (independent of midlevel providers or residents) on the following activities: development of treatment plan with patient and/or surrogate as well as nursing, discussions with consultants, evaluation of patient's response to treatment, examination of patient, obtaining history from patient or surrogate, ordering and performing treatments and interventions, ordering and review of laboratory studies, ordering and review of radiographic studies, pulse oximetry and re-evaluation of patient's condition.  Glori Bickers, MD  8:53 AM

## 2020-02-17 NOTE — Consult Note (Addendum)
Neurology Consultation  Reason for Consult: ICH, left-sided weakness Referring Physician: Dr. Glori Bickers  CC: Weakness  History is obtained from: Chart review, patient's wife at bedside  HPI: Jeffrey Morse is a 65 y.o. male hypertension, sleep apnea, hypercholesterolemia, was brought in for evaluation of what started like chest pain on 02/15/2020, and the family wanted to take him to urgent care but before they could get inside the urgent care he collapsed and CPR was initiated.  When EMS arrived, he was in V. fib and was shocked successfully.  CPR was done for about 15 minutes before ROSC.  While in the ambulance he lost pulses again went into PEA.  CPR was resumed and when he arrived at the ED via EMS, he was still undergoing CPR.  Pulse check revealed that he had a pulse and rhythm.  Patient was intubated and shortly after that went again into PEA arrest.  CPR done for another 5 minutes prior to ROSC.  Patient was hypotensive but improved with epinephrine.  Bedside echocardiogram showed severely reduced LV systolic function with no evidence of pericardial effusion.  EKG in between CPR rounds showed evidence of inferior ST elevation with lateral involvement suggestive of left circumflex occlusion.  Went in for emergent cardiac cath, successful PCI with drug-eluting stent placement to the left circumflex, started on cangrelor till Brilinta was started via NG tube, with recommendation for dual antiplatelet for 12 months. Care transferred to Dr. Haroldine Laws who is managing heart failure issues. He had an Impella device and continued on heparin till this morning when the Impella was taken out.  Continues to be on Brilinta as well as aspirin. During all this cardiac work-up and care, it was noted that he had some left-sided weakness but this evening was not able to move the left arm at all.  At best he was able to have some flicker movement on that left arm since presentation making the last known  well somewhere on Wednesday, 02/15/2020. CT head was done that is concerning for a small left thalamic lar bleed. Neurological consultation was obtained for such. GCS 11.  LKW: Sometime on 02/15/2020 tpa given?: no, ICH Premorbid modified Rankin scale (mRS):0 ICH Score: 1 (for GCS)   ROS: Unable to perform due to mental status and intubation Past Medical History:  Diagnosis Date  . HTN (hypertension)   . Hypercholesteremia   . Sleep apnea     Family History  Problem Relation Age of Onset  . Colon cancer Mother   . Stroke Father      Social History:   reports that he has quit smoking. He has never used smokeless tobacco. He reports previous alcohol use. He reports that he does not use drugs.  Medications  Current Facility-Administered Medications:  .  0.9 %  sodium chloride infusion (Manually program via Guardrails IV Fluids), , Intra-arterial, PRN, Arida, Muhammad A, MD .  0.9 %  sodium chloride infusion, 250 mL, Intravenous, PRN, Fletcher Anon, Muhammad A, MD .  amiodarone (NEXTERONE PREMIX) 360-4.14 MG/200ML-% (1.8 mg/mL) IV infusion, 30 mg/hr, Intravenous, Continuous, Arida, Muhammad A, MD, Last Rate: 16.67 mL/hr at 02/17/20 1600, 30 mg/hr at 02/17/20 1600 .  aspirin chewable tablet 81 mg, 81 mg, Per Tube, Daily, Bensimhon, Shaune Pascal, MD, 81 mg at 02/17/20 0916 .  atorvastatin (LIPITOR) tablet 80 mg, 80 mg, Per Tube, q1800, Bensimhon, Shaune Pascal, MD, 80 mg at 02/16/20 1803 .  cefTRIAXone (ROCEPHIN) 2 g in sodium chloride 0.9 % 100 mL IVPB, 2  g, Intravenous, Q24H, Bensimhon, Shaune Pascal, MD, Stopped at 02/17/20 1007 .  chlorhexidine gluconate (MEDLINE KIT) (PERIDEX) 0.12 % solution 15 mL, 15 mL, Mouth Rinse, BID, Bensimhon, Shaune Pascal, MD, 15 mL at 02/17/20 0748 .  Chlorhexidine Gluconate Cloth 2 % PADS 6 each, 6 each, Topical, Daily, Bensimhon, Shaune Pascal, MD, 6 each at 02/17/20 0914 .  dextrose 50 % solution 0-50 mL, 0-50 mL, Intravenous, PRN, Corey Harold, NP .  docusate (COLACE) 50  MG/5ML liquid 100 mg, 100 mg, Per Tube, BID, Corey Harold, NP, 100 mg at 02/17/20 0915 .  feeding supplement (PROSource TF) liquid 90 mL, 90 mL, Per Tube, TID, Bensimhon, Shaune Pascal, MD, 90 mL at 02/17/20 1553 .  feeding supplement (VITAL 1.5 CAL) liquid 1,000 mL, 1,000 mL, Per Tube, Continuous, Bensimhon, Shaune Pascal, MD, Last Rate: 30 mL/hr at 02/17/20 1600, Rate Change at 02/17/20 1600 .  fentaNYL (SUBLIMAZE) bolus via infusion 50 mcg, 50 mcg, Intravenous, Q15 min PRN, Corey Harold, NP .  fentaNYL (SUBLIMAZE) injection 50 mcg, 50 mcg, Intravenous, Once, Corey Harold, NP .  fentaNYL 2580mg in NS 2576m(1040mml) infusion-PREMIX, 0-400 mcg/hr, Intravenous, Continuous, Arida, Muhammad A, MD, Last Rate: 5 mL/hr at 02/17/20 1600, 50 mcg/hr at 02/17/20 1600 .  insulin aspart (novoLOG) injection 0-15 Units, 0-15 Units, Subcutaneous, Q4H, MarAudria NineO, 3 Units at 02/17/20 1603 .  lactated ringers infusion, , Intravenous, Continuous, Bensimhon, DanShaune PascalD, Last Rate: 20 mL/hr at 02/17/20 1600, Infusion Verify at 02/17/20 1600 .  MEDLINE mouth rinse, 15 mL, Mouth Rinse, 10 times per day, Bensimhon, DanShaune PascalD, 15 mL at 02/17/20 1742 .  midazolam (VERSED) 50 mg/50 mL (1 mg/mL) premix infusion, 0.5-10 mg/hr, Intravenous, Continuous, AriWellington HampshireD, Stopped at 02/16/20 1226 .  midazolam (VERSED) bolus via infusion 1-2 mg, 1-2 mg, Intravenous, Q2H PRN, HofCorey HaroldP .  norepinephrine (LEVOPHED) 16 mg in 250m54memix infusion, 0-80 mcg/min, Intravenous, Titrated, Bensimhon, DaniShaune Pascal, Last Rate: 13.13 mL/hr at 02/17/20 1600, 14 mcg/min at 02/17/20 1600 .  ondansetron (ZOFRAN) injection 4 mg, 4 mg, Intravenous, Q6H PRN, Arida, Muhammad A, MD .  pantoprazole (PROTONIX) injection 40 mg, 40 mg, Intravenous, Daily, HoffCorey Harold, 40 mg at 02/17/20 0915 .  polyethylene glycol (MIRALAX / GLYCOLAX) packet 17 g, 17 g, Per Tube, Daily, HoffCorey Harold, 17 g at 02/17/20 0915 .   sodium chloride flush (NS) 0.9 % injection 3 mL, 3 mL, Intravenous, Q12H, Arida, Muhammad A, MD, 3 mL at 02/17/20 0915 .  sodium chloride flush (NS) 0.9 % injection 3 mL, 3 mL, Intravenous, PRN, Arida, Muhammad A, MD .  ticagrelor (BRILINTA) tablet 90 mg, 90 mg, Per Tube, BID, Bensimhon, DaniShaune Pascal, 90 mg at 02/17/20 0600 .  vasopressin (PITRESSIN) 20 Units in sodium chloride 0.9 % 100 mL infusion-*FOR SHOCK*, 0.03 Units/min, Intravenous, Continuous, Bensimhon, DaniShaune Pascal, Last Rate: 9 mL/hr at 02/17/20 1742, 0.03 Units/min at 02/17/20 1742  Exam: Current vital signs: BP 116/72   Pulse (!) 56   Temp 99.32 F (37.4 C)   Resp (!) 24   Ht 5' 10"  (1.778 m)   Wt 106.7 kg   SpO2 99%   BMI 33.75 kg/m  Vital signs in last 24 hours: Temp:  [99.1 F (37.3 C)-100.4 F (38 C)] 99.32 F (37.4 C) (01/14 1730) Pulse Rate:  [55-127] 56 (01/14 1730) Resp:  [16-27] 24 (01/14 1730) BP: (105-116)/(60-72) 116/72 (01/14 1532) SpO2:  [  89 %-100 %] 99 % (01/14 1730) Arterial Line BP: (87-167)/(51-93) 108/51 (01/14 1730) FiO2 (%):  [50 %-80 %] 70 % (01/14 1532) Weight:  [106.7 kg] 106.7 kg (01/14 0615) General examination, intubated, on minimal sedation with Versed HEENT: Normocephalic atraumatic endotracheal tube in place Lungs clear to auscultation Cardiovascular: Regular rate rhythm Abdomen soft nondistended nontender Extremities warm well perfused Neurological exam Mental status: He is intubated, nods yes and no to questions.  Unclear if he is completely oriented. Cannot assess speech due to intubation Follows basic simple commands consistently. Cranial nerves: Pupils equal round reactive to light, extraocular movements appear intact and unrestricted with blinking to threat on both sides, difficult to ascertain facial symmetry at this time. Motor examination: Flaccid left upper extremity, to noxious stimulation, left lower extremity has minimal triple flexion.  He has an at rest upgoing toe on  the left.  Right upper extremity is antigravity is able to lift on command and keep up for 10 seconds.  Right lower extremity is at least antigravity. Sensory exam: Intact to touch all over Coordination cannot be assessed due to his mentation NIH stroke scale 1a Level of Conscious.: 1 1b LOC Questions: 2 1c LOC Commands: 0 2 Best Gaze: 0 3 Visual: 0 4 Facial Palsy: 0 5a Motor Arm - left: 4 5b Motor Arm - Right: 0 6a Motor Leg - Left: 3 6b Motor Leg - Right: 1 7 Limb Ataxia: 0 8 Sensory: 0 9 Best Language: 3 10 Dysarthria: 2 11 Extinct. and Inatten.: 0 TOTAL: 15   Labs I have reviewed labs in epic and the results pertinent to this consultation are:  CBC    Component Value Date/Time   WBC 11.5 (H) 02/17/2020 0449   RBC 3.53 (L) 02/17/2020 0449   HGB 10.1 (L) 02/17/2020 0449   HCT 31.3 (L) 02/17/2020 0449   PLT 190 02/17/2020 0449   MCV 88.7 02/17/2020 0449   MCH 28.6 02/17/2020 0449   MCHC 32.3 02/17/2020 0449   RDW 13.9 02/17/2020 0449   LYMPHSABS 5.3 (H) 02/15/2020 1114   MONOABS 0.7 02/15/2020 1114   EOSABS 0.1 02/15/2020 1114   BASOSABS 0.1 02/15/2020 1114    CMP     Component Value Date/Time   NA 134 (L) 02/17/2020 0449   K 4.0 02/17/2020 0449   CL 101 02/17/2020 0449   CO2 24 02/17/2020 0449   GLUCOSE 196 (H) 02/17/2020 0449   BUN 22 02/17/2020 0449   CREATININE 1.08 02/17/2020 0449   CALCIUM 8.1 (L) 02/17/2020 0449   PROT 5.2 (L) 02/16/2020 0323   ALBUMIN 2.9 (L) 02/16/2020 0323   AST 175 (H) 02/16/2020 0323   ALT 53 (H) 02/16/2020 0323   ALKPHOS 67 02/16/2020 0323   BILITOT 0.6 02/16/2020 0323   GFRNONAA >60 02/17/2020 0449    Lipid Panel     Component Value Date/Time   CHOL 207 (H) 02/15/2020 1114   TRIG 119 02/15/2020 1114   HDL 48 02/15/2020 1114   CHOLHDL 4.3 02/15/2020 1114   VLDL 24 02/15/2020 1114   LDLCALC 135 (H) 02/15/2020 1114    2D echo IMPRESSIONS  1. Left ventricular ejection fraction, by estimation, is 50 to 55%. The   left ventricle has mildly decreased function. The left ventricle  demonstrates regional wall motion abnormalities. Posterior wall  hypokinesis.  2. Impella measures about 3cm into LV cavity from aortic valve  3. Right ventricular systolic function is normal. The right ventricular  size is normal.  Imaging I have reviewed the images obtained: CT-scan of the brain-small thalamic hyperdensity likely bleed-on the right.   MRI examination of the brain-recommended and pending  Assessment:  65 year old man status post multiple cardiac arrests, status post cardiac cath with successful PCI, initially on cangrelor and now on dual antiplatelets with left-sided hemiplegia with last known normal 2 days ago. CT head revealing of a small thalamic hyperdensity, concerning for ICH-or could be hemorrhagic transformation of an ischemic stroke.   Likely happened around the time of CPR although he was hypotensive and not hypotensive at that time. Patient has a stent that requires him to be on dual antiplatelets. Tricky situation to manage antiplatelets given the ICH. Other possibilities are that he was also had a small ischemic infarct around that area as well over this is a hemorrhagic transformation of an ischemic infarct given the fact that he is on dual antiplatelets.  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   But in his case given his current cardiac status with a stent, size of the bleed is very small-if you absolutely have to continue the dual antiplatelets, continue.  Closely monitor his neurological status. -At any given point, if his neurological status worsens, repeat head CT.  At that time decision might have to be made to discontinue the antiplatelets. -Keep  systolic blood pressures less than 140. -Frequent neurochecks -Check A1c.  Lipid panel has already been checked.  LDL is 135.  We will hold off on statin given acute ICH.  I have discontinued the order for the statin. -Echo already has been completed.  No need to repeat. -MRI brain without contrast, MRA head without contrast -Carotid dopplers Discussed w/ Mickel Baas - NP HF team Sent recs to Dr. Haroldine Laws over secure chat. Orders for recs placed in the chart. Stroke team will follow.  -- Amie Portland, MD Neurologist [facility] Pager: [phone]  CRITICAL CARE ATTESTATION Performed by: Amie Portland, MD Total critical care time: 44 minutes Critical care time was exclusive of separately billable procedures and treating other patients and/or supervising APPs/Residents/Students Critical care was necessary to treat or prevent imminent or life-threatening deterioration due to [place]  This patient is critically ill and at significant risk for neurological worsening and/or death and care requires constant monitoring. Critical care was time spent personally by me on the following activities: development of treatment plan with patient and/or surrogate as well as nursing, discussions with consultants, evaluation of patient's response to treatment, examination of patient, obtaining history from patient or surrogate, ordering and performing treatments and interventions, ordering and review of laboratory studies, ordering and review of radiographic studies, pulse oximetry, re-evaluation of patient's condition, participation in multidisciplinary rounds and medical decision making of high complexity in the care of this patient.

## 2020-02-17 NOTE — Care Plan (Addendum)
CT shows questionable R thalamic hemorrhage.  Updated family.  Called neuro.  Neuro recs MRI. D/C swan but keep introducer for MRI. Also would recommend changing brilinta to either ASA or plavix to due higher risk of bleeding on brilinta.  Will d/c brilinta and change to plavix.  If overnight has change in mental status or new neuro symptoms, please get STAT CT brain.  Keep SBP < 140.    Vomiting, limited with antiemetics d/t prolong QT resulting in VT arrest.  Will obtain abd xray and OGT to suction. Keep NPO.

## 2020-02-17 NOTE — Progress Notes (Signed)
ANTICOAGULATION CONSULT NOTE  Pharmacy Consult for heparin Indication: Impella  No Known Allergies  Patient Measurements: Height: 5\' 10"  (177.8 cm) Weight: 105.2 kg (231 lb 14.8 oz) IBW/kg (Calculated) : 73 Heparin Dosing Weight:   Vital Signs: Temp: 99.1 F (37.3 C) (01/14 0500) Temp Source: Core (01/14 0000) Pulse Rate: 59 (01/14 0500)  Labs: Recent Labs    02/15/20 1114 02/15/20 1216 02/15/20 1523 02/15/20 1658 02/16/20 0323 02/16/20 0448 02/16/20 1200 02/16/20 1618 02/16/20 2022 02/17/20 0116 02/17/20 0118 02/17/20 0449  HGB 14.4   < >  --    < > 11.9*  --   --  11.2*  --  10.2*  --  10.1*  HCT 45.7   < >  --    < > 36.3*  --   --  32.5*  --  30.0*  --  31.3*  PLT 280  --   --    < > 257  --   --  212  --   --   --  190  APTT 28  --   --   --   --   --   --   --   --   --   --   --   LABPROT 14.0  --   --   --   --   --   --   --   --   --   --   --   INR 1.1  --   --   --   --   --   --   --   --   --   --   --   HEPARINUNFRC  --   --   --   --   --    < > 0.20*  --  <0.10*  --   --  <0.10*  CREATININE 1.26*  --  1.08   < > 1.42*  --   --  1.15  --   --  1.28*  --   TROPONINIHS 195*  --  4,374*  --   --   --   --   --   --   --   --   --    < > = values in this interval not displayed.    Estimated Creatinine Clearance: 71.8 mL/min (A) (by C-G formula based on SCr of 1.28 mg/dL (H)).   Medical History: Past Medical History:  Diagnosis Date  . HTN (hypertension)   . Hypercholesteremia   . Sleep apnea      Assessment: 10 yoM admitted with VF arrest and STEMI c/b cardiogenic shock requiring Impella CP placement. Pharmacy to begin IV heparin per protocol.   ACT 154 on 1/12@2211 . Cangrelor now off. Impella purge flow at 16.4 mL/hr (820 units/hr), purge pressures in 400s. Given ACT<160, will start heparin systemically at 100 units/hr and get level in 6 hours.   1/14 AM update:  Heparin level low No issues per RN Brief VT arrest with ROSC last  night  Goal of Therapy:  Heparin level 0.2-0.5 units/ml Monitor platelets by anticoagulation protocol: Yes   Plan:  -Continue heparinized purge solution -Inc systemic heparin infusion to 400 units/hr -1200 heparin level -Monitor daily HL, CBC, LDH, and for s/sx of bleeding   2/14, PharmD, BCPS Clinical Pharmacist Phone: [phone]

## 2020-02-18 ENCOUNTER — Inpatient Hospital Stay: Payer: Self-pay

## 2020-02-18 ENCOUNTER — Inpatient Hospital Stay (HOSPITAL_COMMUNITY): Payer: BC Managed Care – PPO

## 2020-02-18 DIAGNOSIS — I609 Nontraumatic subarachnoid hemorrhage, unspecified: Secondary | ICD-10-CM

## 2020-02-18 DIAGNOSIS — I6330 Cerebral infarction due to thrombosis of unspecified cerebral artery: Secondary | ICD-10-CM | POA: Insufficient documentation

## 2020-02-18 LAB — BASIC METABOLIC PANEL
Anion gap: 9 (ref 5–15)
BUN: 22 mg/dL (ref 8–23)
CO2: 26 mmol/L (ref 22–32)
Calcium: 8.2 mg/dL — ABNORMAL LOW (ref 8.9–10.3)
Chloride: 102 mmol/L (ref 98–111)
Creatinine, Ser: 0.99 mg/dL (ref 0.61–1.24)
GFR, Estimated: >60 mL/min (ref >60)
Glucose, Bld: 116 mg/dL — ABNORMAL HIGH (ref 70–99)
Potassium: 3.7 mmol/L (ref 3.5–5.1)
Sodium: 137 mmol/L (ref 135–145)

## 2020-02-18 LAB — CBC
HCT: 28.1 % — ABNORMAL LOW (ref 39.0–52.0)
Hemoglobin: 9 g/dL — ABNORMAL LOW (ref 13.0–17.0)
MCH: 29 pg (ref 26.0–34.0)
MCHC: 32 g/dL (ref 30.0–36.0)
MCV: 90.6 fL (ref 80.0–100.0)
Platelets: 138 10*3/uL — ABNORMAL LOW (ref 150–400)
RBC: 3.1 MIL/uL — ABNORMAL LOW (ref 4.22–5.81)
RDW: 13.8 % (ref 11.5–15.5)
WBC: 8.1 10*3/uL (ref 4.0–10.5)
nRBC: 0 % (ref 0.0–0.2)

## 2020-02-18 LAB — POCT I-STAT 7, (LYTES, BLD GAS, ICA,H+H)
Acid-Base Excess: 5 mmol/L — ABNORMAL HIGH (ref 0.0–2.0)
Bicarbonate: 29.6 mmol/L — ABNORMAL HIGH (ref 20.0–28.0)
Calcium, Ion: 1.2 mmol/L (ref 1.15–1.40)
HCT: 25 % — ABNORMAL LOW (ref 39.0–52.0)
Hemoglobin: 8.5 g/dL — ABNORMAL LOW (ref 13.0–17.0)
O2 Saturation: 100 %
Patient temperature: 37.6
Potassium: 3.7 mmol/L (ref 3.5–5.1)
Sodium: 138 mmol/L (ref 135–145)
TCO2: 31 mmol/L (ref 22–32)
pCO2 arterial: 42.7 mmHg (ref 32.0–48.0)
pH, Arterial: 7.452 — ABNORMAL HIGH (ref 7.350–7.450)
pO2, Arterial: 235 mmHg — ABNORMAL HIGH (ref 83.0–108.0)

## 2020-02-18 LAB — GLUCOSE, CAPILLARY
Glucose-Capillary: 110 mg/dL — ABNORMAL HIGH (ref 70–99)
Glucose-Capillary: 110 mg/dL — ABNORMAL HIGH (ref 70–99)
Glucose-Capillary: 113 mg/dL — ABNORMAL HIGH (ref 70–99)
Glucose-Capillary: 121 mg/dL — ABNORMAL HIGH (ref 70–99)
Glucose-Capillary: 122 mg/dL — ABNORMAL HIGH (ref 70–99)
Glucose-Capillary: 125 mg/dL — ABNORMAL HIGH (ref 70–99)
Glucose-Capillary: 82 mg/dL (ref 70–99)
Glucose-Capillary: 94 mg/dL (ref 70–99)

## 2020-02-18 LAB — LIPID PANEL
Cholesterol: 133 mg/dL (ref 0–200)
HDL: 38 mg/dL — ABNORMAL LOW (ref >40)
LDL Cholesterol: 76 mg/dL (ref 0–99)
Total CHOL/HDL Ratio: 3.5 RATIO
Triglycerides: 97 mg/dL (ref <150)
VLDL: 19 mg/dL (ref 0–40)

## 2020-02-18 LAB — HEMOGLOBIN A1C
Hgb A1c MFr Bld: 6.1 % — ABNORMAL HIGH (ref 4.8–5.6)
Mean Plasma Glucose: 128.37 mg/dL

## 2020-02-18 LAB — COOXEMETRY PANEL
Carboxyhemoglobin: 0.8 % (ref 0.5–1.5)
Methemoglobin: 1.1 % (ref 0.0–1.5)
O2 Saturation: 73.8 %
Total hemoglobin: 8.3 g/dL — ABNORMAL LOW (ref 12.0–16.0)

## 2020-02-18 LAB — LACTATE DEHYDROGENASE: LDH: 451 U/L — ABNORMAL HIGH (ref 98–192)

## 2020-02-18 LAB — CALCIUM, IONIZED: Calcium, Ionized, Serum: 4.5 mg/dL (ref 4.5–5.6)

## 2020-02-18 LAB — PROCALCITONIN: Procalcitonin: 3.74 ng/mL

## 2020-02-18 MED ORDER — SODIUM CHLORIDE 0.9% FLUSH
10.0000 mL | Freq: Two times a day (BID) | INTRAVENOUS | Status: DC
  Administered 2020-02-18: 20 mL
  Administered 2020-02-18 – 2020-02-20 (×4): 10 mL
  Administered 2020-02-21: 40 mL
  Administered 2020-02-21 – 2020-02-23 (×4): 10 mL

## 2020-02-18 MED ORDER — METHYLNALTREXONE BROMIDE 12 MG/0.6ML ~~LOC~~ SOLN
12.0000 mg | Freq: Once | SUBCUTANEOUS | Status: AC
  Administered 2020-02-18: 12 mg via SUBCUTANEOUS
  Filled 2020-02-18: qty 0.6

## 2020-02-18 MED ORDER — SODIUM CHLORIDE 0.9% FLUSH: 10.0000 mL | INTRAVENOUS | Status: DC | PRN

## 2020-02-18 MED ORDER — SODIUM CHLORIDE 0.9% FLUSH: 10.0000 mL | Freq: Two times a day (BID) | INTRAVENOUS | Status: DC

## 2020-02-18 MED ORDER — NEOSTIGMINE METHYLSULFATE 10 MG/10ML IV SOLN
0.2500 mg | Freq: Four times a day (QID) | INTRAVENOUS | Status: AC
  Administered 2020-02-18 – 2020-02-20 (×8): 0.25 mg via SUBCUTANEOUS
  Filled 2020-02-18 (×11): qty 0.25

## 2020-02-18 MED ORDER — NEOSTIGMINE METHYLSULFATE 10 MG/10ML IV SOLN: 0.5000 mg | Freq: Four times a day (QID) | INTRAVENOUS | Status: DC

## 2020-02-18 MED ORDER — MIDAZOLAM HCL 2 MG/2ML IJ SOLN
INTRAMUSCULAR | Status: AC
  Filled 2020-02-18: qty 2

## 2020-02-18 MED ORDER — BISACODYL 10 MG RE SUPP
10.0000 mg | Freq: Once | RECTAL | Status: AC
  Administered 2020-02-18: 10 mg via RECTAL
  Filled 2020-02-18: qty 1

## 2020-02-18 MED ORDER — POTASSIUM CHLORIDE 10 MEQ/100ML IV SOLN
10.0000 meq | INTRAVENOUS | Status: AC
  Administered 2020-02-18 (×2): 10 meq via INTRAVENOUS
  Filled 2020-02-18 (×2): qty 100

## 2020-02-18 MED ORDER — METOCLOPRAMIDE HCL 5 MG/ML IJ SOLN
10.0000 mg | Freq: Every day | INTRAMUSCULAR | Status: DC
  Administered 2020-02-18: 10 mg via INTRAVENOUS
  Filled 2020-02-18: qty 2

## 2020-02-18 MED ORDER — SORBITOL 70 % SOLN
60.0000 mL | Freq: Every day | Status: DC
  Administered 2020-02-18 – 2020-02-22 (×4): 60 mL
  Filled 2020-02-18 (×4): qty 60

## 2020-02-18 NOTE — Plan of Care (Signed)
Problem: Activity: Goal: Ability to tolerate increased activity will improve Outcome: Progressing Note: Moving right side appropriately, left leg weaker and left arm not able to move at all. Gagging on tube when awake with a strong forceful cough.    Problem: Respiratory: Goal: Ability to maintain a clear airway and adequate ventilation will improve Outcome: Progressing   Problem: Role Relationship: Goal: Method of communication will improve Outcome: Progressing Note: Able to nod and follows commands appropriately    Problem: Education: Goal: Understanding of CV disease, CV risk reduction, and recovery process will improve Outcome: Progressing Goal: Individualized Educational Video(s) Outcome: Progressing   Problem: Activity: Goal: Ability to return to baseline activity level will improve Outcome: Progressing Variance Extended ICU stay ( > 4 days) Impact: High Note: Will be in ICU for extended timeframe, working with PT/OT    Problem: Cardiovascular: Goal: Ability to achieve and maintain adequate cardiovascular perfusion will improve Outcome: Progressing Goal: Vascular access site(s) Level 0-1 will be maintained Outcome: Progressing   Problem: Health Behavior/Discharge Planning: Goal: Ability to safely manage health-related needs after discharge will improve Outcome: Progressing   Problem: Clinical Measurements: Goal: Ability to maintain clinical measurements within normal limits will improve Outcome: Progressing Goal: Will remain free from infection Outcome: Progressing Goal: Diagnostic test results will improve Outcome: Progressing Goal: Respiratory complications will improve Outcome: Progressing Goal: Cardiovascular complication will be avoided Outcome: Progressing Variance Unpredicted event Impact: High Note: Vtach Arrest in ICU, setback for healing process   Problem: Activity: Goal: Risk for activity intolerance will decrease Outcome: Progressing    Problem: Coping: Goal: Level of anxiety will decrease Outcome: Progressing   Problem: Elimination: Goal: Will not experience complications related to urinary retention Outcome: Progressing   Problem: Pain Managment: Goal: General experience of comfort will improve Outcome: Progressing   Problem: Safety: Goal: Ability to remain free from injury will improve Outcome: Progressing   Problem: Skin Integrity: Goal: Risk for impaired skin integrity will decrease Outcome: Progressing   Problem: Education: Goal: Knowledge of disease or condition will improve Outcome: Progressing Note: Working on education with family and understanding disease process/ clinical implications Goal: Knowledge of secondary prevention will improve Outcome: Progressing Goal: Knowledge of patient specific risk factors addressed and post discharge goals established will improve Outcome: Progressing Goal: Individualized Educational Video(s) Outcome: Progressing   Problem: Self-Care: Goal: Ability to participate in self-care as condition permits will improve Outcome: Progressing   Problem: Nutrition: Goal: Risk of aspiration will decrease Outcome: Progressing   Problem: Intracerebral Hemorrhage Tissue Perfusion: Goal: Complications of Intracerebral Hemorrhage will be minimized Outcome: Progressing   Problem: Education: Goal: Knowledge of General Education information will improve Description: Including pain rating scale, medication(s)/side effects and non-pharmacologic comfort measures Outcome: Not Progressing Variance Slow or unresponsive to therapy Impact: Moderate   Problem: Health Behavior/Discharge Planning: Goal: Ability to manage health-related needs will improve Outcome: Not Progressing Variance Extended ICU stay ( > 4 days) Impact: Critical Note: Still very ill    Problem: Nutrition: Goal: Adequate nutrition will be maintained Outcome: Not Progressing Variances Clinical  complication Impact: High Department closed Impact: High Note: Unable to tolerate tube feeds and keep anyting down OG.  Will need cortrak when team back on monday   Problem: Elimination: Goal: Will not experience complications related to bowel motility Outcome: Not Progressing   Problem: Coping: Goal: Will verbalize positive feelings about self Outcome: Not Progressing Goal: Will identify appropriate support needs Outcome: Not Progressing   Problem: Health Behavior/Discharge Planning: Goal: Ability to manage  health-related needs will improve Outcome: Not Progressing   Problem: Self-Care: Goal: Verbalization of feelings and concerns over difficulty with self-care will improve Outcome: Not Progressing Goal: Ability to communicate needs accurately will improve Outcome: Not Progressing   Problem: Nutrition: Goal: Dietary intake will improve Outcome: Not Progressing

## 2020-02-18 NOTE — Progress Notes (Addendum)
Advanced Heart Failure Rounding Note   Subjective:    1/12 admitted for Vfib arrest, cath PCI/DES and impella 1/14 VT arrest, ROSC after 3 minutes.  1/15 Impella removed 1/16 Left sided weakness. MRI with multiple small infarcts and small ICH  Awake on vent. 60% FiO2 Will follow commands. Weak on left  Brilinta switched to Plavix due to [place]. Not holding anything down.   Remains on VP 0.03, NE down to 3. Luiz Blare out. Co-ox 74%.   Echo EF ~50-55% LV small RV ok.   Objective:   Weight Range:  Vital Signs:   Temp:  [98.6 F (37 C)-101.1 F (38.4 C)] 101.1 F (38.4 C) (01/15 0735) Pulse Rate:  [51-92] 73 (01/15 0757) Resp:  [15-27] 18 (01/15 0757) BP: (111-128)/(63-83) 127/63 (01/15 0757) SpO2:  [97 %-100 %] 100 % (01/15 0757) Arterial Line BP: (97-167)/(50-100) 106/57 (01/15 0700) FiO2 (%):  [60 %-80 %] 60 % (01/15 0757) Weight:  [104.2 kg] 104.2 kg (01/15 0500) Last BM Date:  (UTA)  Weight change: Filed Weights   02/16/20 0600 02/17/20 0615 02/18/20 0500  Weight: 105.2 kg 106.7 kg 104.2 kg    Intake/Output:   Intake/Output Summary (Last 24 hours) at 02/18/2020 0824 Last data filed at 02/18/2020 0800 Gross per 24 hour  Intake 2052.6 ml  Output 3580 ml  Net -1527.4 ml     Physical Exam: General: Awake on vent will follow commands HEENT: normal + ETT Neck: supple. RIJ introducerCarotids 2+ bilat; no bruits. No lymphadenopathy or thryomegaly appreciated. Cor: PMI nondisplaced. Regular rate & rhythm. No rubs, gallops or murmurs. Lungs: clear Abdomen: soft, nontender, nondistended. No hepatosplenomegaly. No bruits or masses. Good bowel sounds. Extremities: no cyanosis, clubbing, rash, edema Neuro: awake follows commands. Weak on left   Telemetry: Sinus 60-70s Personally reviewed   Labs: Basic Metabolic Panel: Recent Labs  Lab 02/15/20 1523 02/15/20 1658 02/15/20 1923 02/15/20 2133 02/16/20 0323 02/16/20 1618 02/17/20 0116 02/17/20 0118  02/17/20 0449 02/17/20 1852 02/18/20 0409 02/18/20 0500  NA 138   < > 136   < > 135 137 138 137 134*  --  138 137  K 3.2*   < > 4.2   < > 4.6 4.0 3.3* 3.3* 4.0  --  3.7 3.7  CL 103  --  108   < > 105 104  --  103 101  --   --  102  CO2 22  --  17*   < > 20* 23  --  23 24  --   --  26  GLUCOSE 333*  --  239*   < > 234* 164*  --  239* 196*  --   --  116*  BUN 19  --  21   < > 24* 22  --  21 22  --   --  22  CREATININE 1.08  --  1.28*   < > 1.42* 1.15  --  1.28* 1.08  --   --  0.99  CALCIUM 8.1*  --  8.2*   < > 8.0* 7.7*  --  7.8* 8.1*  --   --  8.2*  MG 1.7  --  2.0  --   --  1.7  --   --  2.2 1.8  --   --   PHOS  --   --   --   --   --  3.2  --   --  2.3* 2.1*  --   --    < > =  values in this interval not displayed.    Liver Function Tests: Recent Labs  Lab 02/15/20 1114 02/16/20 0323  AST 73* 175*  ALT 54* 53*  ALKPHOS 91 67  BILITOT 0.8 0.6  PROT 6.1* 5.2*  ALBUMIN 3.5 2.9*   No results for input(s): LIPASE, AMYLASE in the last 168 hours. No results for input(s): AMMONIA in the last 168 hours.  CBC: Recent Labs  Lab 02/15/20 1114 02/15/20 1140 02/15/20 2250 02/16/20 0323 02/16/20 1618 02/17/20 0116 02/17/20 0449 02/18/20 0409 02/18/20 0500  WBC 14.2*  --  19.2* 18.5* 11.3*  --  11.5*  --  8.1  NEUTROABS 7.8*  --   --   --   --   --   --   --   --   HGB 14.4   < > 12.8* 11.9* 11.2* 10.2* 10.1* 8.5* 9.0*  HCT 45.7   < > 39.2 36.3* 32.5* 30.0* 31.3* 25.0* 28.1*  MCV 90.7  --  88.3 87.9 86.7  --  88.7  --  90.6  PLT 280  --  311 257 212  --  190  --  138*   < > = values in this interval not displayed.    Cardiac Enzymes: No results for input(s): CKTOTAL, CKMB, CKMBINDEX, TROPONINI in the last 168 hours.  BNP: BNP (last 3 results) No results for input(s): BNP in the last 8760 hours.  ProBNP (last 3 results) No results for input(s): PROBNP in the last 8760 hours.    Other results:  Imaging: CT Head Wo Contrast  Result Date: 02/17/2020 CLINICAL DATA:   Altered mental status. EXAM: CT HEAD WITHOUT CONTRAST TECHNIQUE: Contiguous axial images were obtained from the base of the skull through the vertex without intravenous contrast. COMPARISON:  January 17, 2012 FINDINGS: Brain: There is mild cerebral atrophy with widening of the extra-axial spaces and ventricular dilatation. There are areas of decreased attenuation within the white matter tracts of the supratentorial brain, consistent with microvascular disease changes. A 4 mm x 4 mm x 6 mm hyperdense focus is seen within the thalamic region on the right (axial CT image 19). This represents a new finding when compared to the prior study. A trace amount of adjacent anterior white matter low attenuation is noted. There is no evidence of associated mass effect or midline shift. Vascular: No hyperdense vessel or unexpected calcification. Skull: Normal. Negative for fracture or focal lesion. Sinuses/Orbits: There is moderate to marked severity bilateral ethmoid sinus and nasal mucosal thickening. Moderate sized air-fluid levels are seen within the right maxillary sinus and sphenoid sinus. Other: Endotracheal and nasogastric tubes are seen. IMPRESSION: 1. Subcentimeter hyperdense focus within the right thalamic region, suspicious for a small acute bleed. MRI correlation is recommended. 2. Mild cerebral atrophy. 3. Moderate to marked severity paranasal sinus disease. Electronically Signed   By: Virgina Norfolk M.D.   On: 02/17/2020 17:16   MR ANGIO HEAD WO CONTRAST  Result Date: 02/17/2020 CLINICAL DATA:  Anoxic brain injury. EXAM: MRI HEAD WITHOUT CONTRAST MRA HEAD WITHOUT CONTRAST TECHNIQUE: Multiplanar, multiecho pulse sequences of the brain and surrounding structures were obtained without intravenous contrast. Angiographic images of the head were obtained using MRA technique without contrast. COMPARISON:  Brain MRI 01/18/2012 FINDINGS: MRI HEAD FINDINGS Brain: There are multiple small acute infarcts within the  frontal lobes. There are microhemorrhages in the right thalamus and left parietal lobe. The left parietal hemorrhage is unchanged, but the right thalamic hemorrhage is new. Aside from the above  described lesions, the white matter signal is normal. The midline structures are normal. Vascular: Major flow voids are preserved. Skull and upper cervical spine: Normal calvarium and skull base. Visualized upper cervical spine and soft tissues are normal. Sinuses/Orbits:Fluid in the ethmoid and sphenoid sinuses and right maxillary sinus. Bilateral mastoid effusions. Normal orbits. MRA HEAD FINDINGS POSTERIOR CIRCULATION: --Vertebral arteries: Normal --Inferior cerebellar arteries: Normal. --Basilar artery: Normal. --Superior cerebellar arteries: Normal. --Posterior cerebral arteries: Normal. ANTERIOR CIRCULATION: --Intracranial internal carotid arteries: Normal. --Anterior cerebral arteries (ACA): Normal. --Middle cerebral arteries (MCA): Normal. ANATOMIC VARIANTS: None IMPRESSION: 1. Multiple small acute infarcts within the frontal lobes. 2. New right thalamic microhemorrhage, since 01/18/12. 3. Normal intracranial MRA. Electronically Signed   By: Ulyses Jarred M.D.   On: 02/17/2020 23:14   MR BRAIN WO CONTRAST  Result Date: 02/17/2020 CLINICAL DATA:  Anoxic brain injury. EXAM: MRI HEAD WITHOUT CONTRAST MRA HEAD WITHOUT CONTRAST TECHNIQUE: Multiplanar, multiecho pulse sequences of the brain and surrounding structures were obtained without intravenous contrast. Angiographic images of the head were obtained using MRA technique without contrast. COMPARISON:  Brain MRI 01/18/2012 FINDINGS: MRI HEAD FINDINGS Brain: There are multiple small acute infarcts within the frontal lobes. There are microhemorrhages in the right thalamus and left parietal lobe. The left parietal hemorrhage is unchanged, but the right thalamic hemorrhage is new. Aside from the above described lesions, the white matter signal is normal. The midline  structures are normal. Vascular: Major flow voids are preserved. Skull and upper cervical spine: Normal calvarium and skull base. Visualized upper cervical spine and soft tissues are normal. Sinuses/Orbits:Fluid in the ethmoid and sphenoid sinuses and right maxillary sinus. Bilateral mastoid effusions. Normal orbits. MRA HEAD FINDINGS POSTERIOR CIRCULATION: --Vertebral arteries: Normal --Inferior cerebellar arteries: Normal. --Basilar artery: Normal. --Superior cerebellar arteries: Normal. --Posterior cerebral arteries: Normal. ANTERIOR CIRCULATION: --Intracranial internal carotid arteries: Normal. --Anterior cerebral arteries (ACA): Normal. --Middle cerebral arteries (MCA): Normal. ANATOMIC VARIANTS: None IMPRESSION: 1. Multiple small acute infarcts within the frontal lobes. 2. New right thalamic microhemorrhage, since 01/18/12. 3. Normal intracranial MRA. Electronically Signed   By: Ulyses Jarred M.D.   On: 02/17/2020 23:14   DG CHEST PORT 1 VIEW  Result Date: 02/16/2020 CLINICAL DATA:  65 year old male with heart failure EXAM: PORTABLE CHEST 1 VIEW COMPARISON:  02/15/2020 FINDINGS: Cardiomediastinal silhouette unchanged size and contour. Endotracheal tube is unchanged, terminating 5.8 cm above the carina. Unchanged gastric tube projecting over the mediastinum and terminating in the abdomen out of the field of view. Unchanged right IJ sheath transmitting Swan-Ganz catheter, with the tip terminating over the right hilum. Overlying EKG leads. Low lung volumes persist with patchy hilar opacities, and coarsened interstitial markings. No pneumothorax or pleural effusion. No significant interlobular septal thickening. IMPRESSION: Low lung volumes persist, with likely atelectasis at the lung bases and no overt edema or confluent airspace disease. Unchanged position of endotracheal tube, gastric tube, right IJ sheath/Swan-Ganz. Electronically Signed   By: Corrie Mckusick D.O.   On: 02/16/2020 09:21   DG Abd Portable  1V  Result Date: 02/17/2020 CLINICAL DATA:  Ileus EXAM: PORTABLE ABDOMEN - 1 VIEW COMPARISON:  Abdominal radiograph February 15, 2020. FINDINGS: Enteric tube with tip and side port below the diaphragm in stomach. Defibrillator leads overlie the left upper quadrant. The bowel gas pattern is normal. Osseous structures are unchanged. IMPRESSION: Enteric tube in satisfactory position, nonobstructive bowel gas pattern. Electronically Signed   By: Dahlia Bailiff MD   On: 02/17/2020 18:18   [place] EKG SITE RITE  Result Date: 02/18/2020 If Site Rite image not attached, placement could not be confirmed due to current cardiac rhythm.    Medications:     Scheduled Medications: . aspirin  81 mg Per Tube Daily  . chlorhexidine gluconate (MEDLINE KIT)  15 mL Mouth Rinse BID  . Chlorhexidine Gluconate Cloth  6 each Topical Daily  . clopidogrel  75 mg Per Tube Daily  . docusate  100 mg Per Tube BID  . feeding supplement (PROSource TF)  90 mL Per Tube TID  . fentaNYL (SUBLIMAZE) injection  50 mcg Intravenous Once  . insulin aspart  0-15 Units Subcutaneous Q4H  . mouth rinse  15 mL Mouth Rinse 10 times per day  . metoCLOPramide (REGLAN) injection  10 mg Intravenous Daily  . pantoprazole (PROTONIX) IV  40 mg Intravenous Daily  . polyethylene glycol  17 g Per Tube Daily  . senna-docusate  1 tablet Per Tube BID  . sodium chloride flush  3 mL Intravenous Q12H    Infusions: . sodium chloride    . amiodarone 30 mg/hr (02/18/20 0800)  . cefTRIAXone (ROCEPHIN)  IV Stopped (02/17/20 1007)  . feeding supplement (VITAL 1.5 CAL) Stopped (02/17/20 1700)  . fentaNYL infusion INTRAVENOUS 75 mcg/hr (02/18/20 0800)  . lactated ringers 20 mL/hr at 02/18/20 0800  . midazolam 1 mg/hr (02/17/20 2100)  . norepinephrine (LEVOPHED) Adult infusion 3 mcg/min (02/18/20 0800)  . potassium chloride 10 mEq (02/18/20 0808)  . vasopressin 0.02 Units/min (02/18/20 0800)    PRN Medications: sodium chloride, sodium chloride,  acetaminophen **OR** acetaminophen (TYLENOL) oral liquid 160 mg/5 mL **OR** acetaminophen, fentaNYL, midazolam, ondansetron (ZOFRAN) IV, sodium chloride flush   Assessment/Plan:   1. Cardiac (VTarrest in setting of acute inferolateral STEMI/VT - OOH cardiac arrest on 1/12 with prolonged CPR, resuscitated w/ CPR + defibrillation   - Emergent cath 1/12 w/ totally occluded mid LCx treated w/ PCI + DES - Recurrent VT on 1/14 -> defib x 1 - Continues on amio. (watch QT)  2. Acute systolic HF due in setting of acute MI -> cardiogenic shock - EF 25% in cath lab 1/12 - Echo 1/13 EF 50-55% - Impella pulled 1/14 - Co-ox 74% on NE 3 and VP 0.03. Wean VP - Lasix 40 IV x 1 today  3. CAD with acute inferolateral  STEMI  - Emergent cath 1/12 w/ totally occluded mid LCx treated w/ PCI + DES - There is moderate ostial LAD disease and significant disease in the distal LAD close to the apex. No significant disease affecting the right coronary artery. - Continue DAPT. Brilinta switched to Plavix - No b-blocker yet with shock  4. Acute CVA with ICH - left-sided weakness - MRI with multiple small infarcts and small ICH (Likely cardioembolic from arrest) - Neuro following - Brilinta switched to Plavix - Keep SBP 110-140 - PT/OT following  5. Acute hypoxic respiratory failure in setting of cardiac arrest - CXR atelectasis, CPT 8.54-->6 -> 3.7; FiO2 60% - On ceftriaxone (D3) - Continue to wean vent. Diurese gently.   6, Ileus - difficult situation. QT 486 with recent VT but needs to hold down Plavix (not candidate for cangrelor due to [place]). Will give daily Reglan and watch QT. Continue stool softeners and laxatives  7. AKI - due to ATN -> resolved  8. DM2 - HgbA1c 6.0, -SSI - Add Jardiance prior to d/c  CRITICAL CARE Performed by: Glori Bickers  Total critical care time: 35 minutes  Critical care time was exclusive  of separately billable procedures and treating other  patients.  Critical care was necessary to treat or prevent imminent or life-threatening deterioration.  Critical care was time spent personally by me (independent of midlevel providers or residents) on the following activities: development of treatment plan with patient and/or surrogate as well as nursing, discussions with consultants, evaluation of patient's response to treatment, examination of patient, obtaining history from patient or surrogate, ordering and performing treatments and interventions, ordering and review of laboratory studies, ordering and review of radiographic studies, pulse oximetry and re-evaluation of patient's condition.   Length of Stay: 3   Glori Bickers MD 02/18/2020, 8:24 AM  Advanced Heart Failure Team Pager [phone] (M-F; 7a - 4p)  Please contact [facility] for night-coverage after hours (4p -7a ) and weekends on [email or web address]

## 2020-02-18 NOTE — Progress Notes (Signed)
Peripherally Inserted Central Catheter Placement  The IV Nurse has discussed with the patient and/or persons authorized to consent for the patient, the purpose of this procedure and the potential benefits and risks involved with this procedure.  The benefits include less needle sticks, lab draws from the catheter, and the patient may be discharged home with the catheter. Risks include, but not limited to, infection, bleeding, blood clot (thrombus formation), and puncture of an artery; nerve damage and irregular heartbeat and possibility to perform a PICC exchange if needed/ordered by physician.  Alternatives to this procedure were also discussed.  Bard Power PICC patient education guide, fact sheet on infection prevention and patient information card has been provided to patient /or left at bedside.    PICC Placement Documentation  PICC Double Lumen 02/18/20 PICC Right Brachial 44 cm 2 cm (Active)  Indication for Insertion or Continuance of Line Prolonged intravenous therapies 02/18/20 1431  Exposed Catheter (cm) 2 cm 02/18/20 1431  Site Assessment Clean;Dry;Intact 02/18/20 1431  Antimicrobial disc in place? Yes 02/18/20 1431  Dressing Intervention New dressing 02/18/20 1431  Dressing Change Due 02/25/20 02/18/20 1431    Consent obtained by Chari Manning RN with the patients wife (Mrs. Lincks)    Consuello Masse 02/18/2020, 2:34 PM

## 2020-02-18 NOTE — Progress Notes (Signed)
NAME:  Jeffrey Morse, MRN:  [id number], DOB:  10-Feb-1955, LOS: 3 ADMISSION DATE:  02/15/2020, CONSULTATION DATE:  02/15/20 REFERRING MD:  Junious Dresser  CHIEF COMPLAINT:  VF arrest   Brief History   Jeffrey Morse is a 65 y.o. male who was brought to [facility] ED 1/12 with VT arrest after he collapsed at [facility] (had gone there for chest pain).  He had multiple recurrent arrests before being taken to cath lab where he was found to have severe LV dysfunction with EF 25-35%, and 100% prox Cx stenosis.  He had DES and Impella placed and was then transferred to the ICU where PCCM was asked to assist with vent management.  History of present illness   Pt is encephelopathic; therefore, this HPI is obtained from chart review. Jeffrey Morse is a 65 y.o. male who has unknown PMH.  He was going to [facility] 1/12 for chest pain and before he got inside, he collapsed and went unresponsive.  Family was with him and began CPR while awaiting EMS.  On EMS arrival, he was found to have VT.  He was shocked once and had ROSC; however, he proceeded to have multiple recurrent arrests.  He was brought to [facility] and taken straight to cath lab where he was found to have severe LV dysfunction EF 25 - 35%, Ost LAD 40% stenosed, Dist LAD 90% stenosed, 1st Marg lesion 90% stenosed, Ost Cx 40% stenosed, Prox Cx 100% stenosed.   He had DES and Impella placed and was transferred to the ICU where PCCM was asked to assist with vent management.  He is currently on 72mcg/min levophed.  Past Medical History  Unknown. has Cardiogenic shock (HCC); Cardiac arrest [facility]); ST elevation myocardial infarction (STEMI) (HCC); Encounter for imaging study to confirm orogastric (OG) tube placement; Acute respiratory failure with hypoxia (HCC); Cerebral thrombosis with cerebral infarction; and Subarachnoid hemorrhage on their problem list.  Significant Hospital Events   1/12 > admit. 1/14 > brief (2 min) VT arrest 1/14 > not moving LUE, multiple small infarcts  found  Consults:  Cards, PCCM.  Procedures:  ETT 1/12 >  Impella 1/12 >  Significant Diagnostic Tests:  Cath lab 1/12 > Severe LV dysfunction EF 25 - 35%, Ost LAD 40% stenosed, Dist LAD 90% stenosed, 1st Marg lesion 90% stenosed, Ost Cx 40% stenosed, Prox Cx 100% stenosed.  DES placed, Impella placed. Echo 1/13 >  1. Left ventricular ejection fraction, by estimation, is 50 to 55%. The  left ventricle has mildly decreased function. The left ventricle  demonstrates regional wall motion abnormalities. Posterior wall  hypokinesis.  2. Impella measures about 3cm into LV cavity from aortic valve  3. Right ventricular systolic function is normal. The right ventricular  size is normal.   MRI Brain 1/14  IMPRESSION: 1. Multiple small acute infarcts within the frontal lobes. 2. New right thalamic microhemorrhage, since 01/18/12. 3. Normal intracranial MRA.  Micro Data:  Flu 1/12 > negative. COVID 1/12 > negative.  Antimicrobials:  Ceftriaxone 1/13>>  Interim history/subjective:  No events, O2 needs improved. Strokes found on MRI yesterday. Spiking small fever this am.  Objective:  Blood pressure 121/60, pulse 73, temperature (!) 101.1 F (38.4 C), temperature source Axillary, resp. rate 18, height 5\' 10"  (1.778 m), weight 104.2 kg, SpO2 100 %. PAP: (22-50)/(13-31) 26/14 CVP:  [4 mmHg-20 mmHg] 4 mmHg CO:  [4.4 L/min-4.8 L/min] 4.8 L/min CI:  [2.1 L/min/m2-2.2 L/min/m2] 2.2 L/min/m2  Vent Mode: PRVC FiO2 (%):  [  40 %-80 %] 40 % Set Rate:  [18 bmp-24 bmp] 18 bmp Vt Set:  [560 mL] 560 mL PEEP:  [6 cmH20-12 cmH20] 6 cmH20 Plateau Pressure:  [21 cmH20-24 cmH20] 21 cmH20   Intake/Output Summary (Last 24 hours) at 02/18/2020 0830 Last data filed at 02/18/2020 0800 Gross per 24 hour  Intake 2052.6 ml  Output 3580 ml  Net -1527.4 ml   Filed Weights   02/16/20 0600 02/17/20 0615 02/18/20 0500  Weight: 105.2 kg 106.7 kg 104.2 kg    Examination: Constitutional: no acute  distress  Eyes: EOMI, pupils equal, reactive Ears, nose, mouth, and throat: ETT in place, minimal secretions Cardiovascular: RRR, ext warm Respiratory: Clear, triggering vent Gastrointestinal: Soft, nearly absent BS Skin: No rashes, normal turgor Neurologic: moves R more briskly than left, most wean in LUE Psychiatric: RASS -1  Sugars okay SvO2 74% Cr okay Pct improved WBC 11>>8 -1.5L yesterday   Assessment & Plan:  Acute hypoxic resp failure post cardiac arrest- CXR now clear OOH Vtach cardiac arrest- suspect related to prox L circ occlusion post DES Cardiogenic shock after arrest- post impella, improving, CHF team following, impella now out Likely CAP vs. Aspiration PNA- on ceftriaxone, improving IH Vtach cardiac arrest- brief, 1/14,  Thought due to ischemic QT prolongation and hypokalemia Embolic strokes, thalamic with hemorrhagic conversion- small, hopefully will have good return of function Ileus vs. Gastroparesis- need to address due to being on DAPT; QT prolongation limits amount of reglan e can use Hypokalemia  - f/u trach aspirate, 7 days ceftriaxone in absence of other data - antiplatelets, GDT, AC, diuretics, antiarrythmics per CHF team and neurology discussion - close neuro checks, repeat CT head if any deterioration - strengthen bowel regimen, trial of subcutaneous neostigmine + relistor - SSI - VAP prevention bundle - Work toward SAT/SBT today  Medical sales representative (evaluated daily):  Diet: TF Pain/Anxiety/Delirium protocol (if indicated): weaning VAP protocol (if indicated): In place. DVT prophylaxis: heparin gtt GI prophylaxis: PPI. Glucose control: Insulin ssi Mobility: Bedrest. Disposition: ICU.  Goals of Care:  Last date of multidisciplinary goals of care discussion:per primary Family and staff present: None. Summary of discussion: None. Follow up goals of care discussion due: 1/18. Code Status:  Full.   Patient critically ill due to respiratory  failure, cardiogenic shock Interventions to address this today ventilator and sedation titration Risk of deterioration without these interventions is high  I personally spent 33 minutes providing critical care not including any separately billable procedures  Myrla Halsted MD [facility] 02/18/2020 8:30 AM Personal pager: [phone] If unanswered, please page CCM On-call: #[phone]

## 2020-02-18 NOTE — Evaluation (Signed)
Occupational Therapy Evaluation Patient Details Name: MCIHAEL Morse MRN: [id number] DOB: 1955/09/28 Today's Date: 02/18/2020    History of Present Illness 65 y.o. male hypertension, sleep apnea, hypercholesterolemia, was brought in for evaluation of what started like chest pain on 02/15/2020. Pt attempted to go to urgent care but collapsed when walking in and underwent CPR. Pt found to be in V fib by EMS, CPR of 15 minutes prior to ROSC. Pt went into PEA again in ambulance with further CPR of 5 minutes until ROSC. Pt was intubated and underwent emergent cath with successful stent placement to L circumflex artery and impella placement on 02/15/2020. 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   Clinical Impression   MD cleared OT/PT assessments.  Unfortunately, the patient was never alert enough to participate with eval, or be able to fully assess his abilities and needs.  OT/PT attempted to increase LOA by sitting edge of bed with no success.  OT spoke by phone with the patient's son to determine prior level of function.  OT to continue efforts in the acute care setting, assess abilities, and make changes as needed to the discharge disposition.      Follow Up Recommendations  SNF    Equipment Recommendations  Other (comment) (To be determined)    Recommendations for Other Services       Precautions / Restrictions Precautions Precautions: Fall Precaution Comments: swann Restrictions Weight Bearing Restrictions: No RLE Weight Bearing: Non weight bearing      Mobility Bed Mobility Overal bed mobility: Needs Assistance Bed Mobility: Supine to Sit;Sit to Supine     Supine to sit: Total assist;+2 for physical assistance;HOB elevated Sit to supine: Total assist;+2 for physical assistance;HOB elevated        Transfers                      Balance Overall balance assessment: Needs assistance Sitting-balance support: No upper extremity supported;Feet  unsupported Sitting balance-Leahy Scale: Zero                                     ADL either performed or assessed with clinical judgement   ADL Overall ADL's : Needs assistance/impaired Eating/Feeding: NPO   Grooming: Total assistance;Bed level   Upper Body Bathing: Total assistance;Bed level   Lower Body Bathing: Total assistance;Bed level   Upper Body Dressing : Bed level;Total assistance   Lower Body Dressing: Total assistance;Bed level       Toileting- Clothing Manipulation and Hygiene: Bed level;Total assistance       Functional mobility during ADLs: Total assistance;+2 for physical assistance       Vision Patient Visual Report: Other (comment) Additional Comments: unable to assess due to decreased LOA     Perception     Praxis      Pertinent Vitals/Pain Pain Assessment: Faces Faces Pain Scale: No hurt Pain Intervention(s): Monitored during session     Hand Dominance Right   Extremity/Trunk Assessment Upper Extremity Assessment Upper Extremity Assessment: LUE deficits/detail;Generalized weakness LUE Deficits / Details: nurse stated L hemi paresis   Lower Extremity Assessment Lower Extremity Assessment: Defer to PT evaluation       Communication Communication Communication: Other (comment)   Cognition Arousal/Alertness: Lethargic Behavior During Therapy: Flat affect Overall Cognitive Status: Difficult to assess  General Comments   Nursing present throughout.      Exercises     Shoulder Instructions      Home Living Family/patient expects to be discharged to:: Private residence Living Arrangements: Spouse/significant other Available Help at Discharge: Family Type of Home: Mobile home Home Access: Stairs to enter Secretary/administrator of Steps: 3 Entrance Stairs-Rails: Can reach both Home Layout: One level     Bathroom Shower/Tub: Tub only   Firefighter:  Standard     Home Equipment: None          Prior Functioning/Environment Level of Independence: Independent        Comments: independent, works in Hospital doctor, working the day prior to MI        OT Problem List: Decreased range of motion;Decreased strength;Impaired balance (sitting and/or standing)      OT Treatment/Interventions: Self-care/ADL training;Therapeutic exercise;Neuromuscular education;DME and/or AE instruction;Balance training;Patient/family education;Therapeutic activities    OT Goals(Current goals can be found in the care plan section) Acute Rehab OT Goals Patient Stated Goal: pt unable to state OT Goal Formulation: Patient unable to participate in goal setting Time For Goal Achievement: 03/03/20 Potential to Achieve Goals: Fair ADL Goals Pt Will Perform Grooming: with modified independence Pt Will Perform Lower Body Bathing: with modified independence;sit to/from stand Pt Will Perform Lower Body Dressing: with modified independence;sit to/from stand Pt Will Transfer to Toilet: with modified independence;ambulating;regular height toilet Pt Will Perform Toileting - Clothing Manipulation and hygiene: with modified independence;sit to/from stand  OT Frequency: Min 2X/week   Barriers to D/C:    none noted       Co-evaluation PT/OT/SLP Co-Evaluation/Treatment: Yes Reason for Co-Treatment: Complexity of the patient's impairments (multi-system involvement);For patient/therapist safety   OT goals addressed during session: ADL's and self-care      AM-PAC OT "6 Clicks" Daily Activity     Outcome Measure Help from another person eating meals?: Total Help from another person taking care of personal grooming?: Total Help from another person toileting, which includes using toliet, bedpan, or urinal?: Total Help from another person bathing (including washing, rinsing, drying)?: Total Help from another person to put on and taking off regular upper body clothing?: Total    6 Click Score: 5   End of Session Equipment Utilized During Treatment: Oxygen Nurse Communication: Mobility status  Activity Tolerance: Treatment limited secondary to medical complications (Comment) Patient left: in bed;with call bell/phone within reach;with nursing/sitter in room  OT Visit Diagnosis: Other symptoms and signs involving the nervous system (U20.254)                Time: 2706-2376 OT Time Calculation (min): 25 min Charges:  OT General Charges $OT Visit: 1 Visit OT Evaluation $OT Eval Moderate Complexity: 1 Mod  02/18/2020  Rich, OTR/L  Acute Rehabilitation Services  Office:  [phone]   Suzanna Obey 02/18/2020, 1:43 PM

## 2020-02-18 NOTE — Progress Notes (Addendum)
STROKE TEAM PROGRESS NOTE  HPI per record: Jeffrey Morse is a 65 y.o. male hypertension, sleep apnea, hypercholesterolemia, was brought in for evaluation of what started like chest pain on 02/15/2020, and the family wanted to take him to urgent care but before they could get inside the urgent care he collapsed and CPR was initiated.  When EMS arrived, he was in V. fib and was shocked successfully.  CPR was done for about 15 minutes before ROSC.  While in the ambulance he lost pulses again went into PEA.  CPR was resumed and when he arrived at the ED via EMS, he was still undergoing CPR.  Pulse check revealed that he had a pulse and rhythm.  Patient was intubated and shortly after that went again into PEA arrest.  CPR done for another 5 minutes prior to ROSC.  Patient was hypotensive but improved with epinephrine.  Bedside echocardiogram showed severely reduced LV systolic function with no evidence of pericardial effusion.  EKG in between CPR rounds showed evidence of inferior ST elevation with lateral involvement suggestive of left circumflex occlusion.  Went in for emergent cardiac cath, successful PCI with drug-eluting stent placement to the left circumflex, started on cangrelor till Brilinta was started via NG tube, with recommendation for dual antiplatelet for 12 months. Care transferred to Dr. Gala Romney who is managing heart failure issues. He had an Impella device and continued on heparin till this morning when the Impella was taken out.  Continues to be on Brilinta as well as aspirin. During all this cardiac work-up and care, it was noted that he had some left-sided weakness but this evening was not able to move the left arm at all.  At best he was able to have some flicker movement on that left arm since presentation making the last known well somewhere on Wednesday, 02/15/2020. CT head was done that is concerning for a small left thalamic lar bleed. Neurological consultation was obtained for  such. GCS 11.  INTERVAL HISTORY Patient evaluated at bedside this morning, with wife present in the room.  Able to arouse patient, unclear if completely oriented or not, follows simple commands.  Moves right extremities, but unable to move left extremities. Explained to wife that the stroke is likely complication of cardiac intervention procedure  Might be expected to do well after working with physical therapy.  Also, talked to patient's sister via telephone and explained her the situation as the patient's wife requested it.  Dr. Gala Romney updated about the current situation of patient and that we will obtain CT scan tomorrow morning to see the stability of hemorrhage.  Blood pressure well controlled with a goal of SBP 110-140s.  See below for details of neurological exam.  Vitals:   02/18/20 1030 02/18/20 1045 02/18/20 1100 02/18/20 1115  BP:      Pulse: 68 68 69 66  Resp: 18 18 18 18   Temp:    99.9 F (37.7 C)  TempSrc:    Axillary  SpO2: 98% 97% 97% 97%  Weight:      Height:       CBC:  Recent Labs  Lab 02/15/20 1114 02/15/20 1140 02/17/20 0449 02/18/20 0409 02/18/20 0500  WBC 14.2*   < > 11.5*  --  8.1  NEUTROABS 7.8*  --   --   --   --   HGB 14.4   < > 10.1* 8.5* 9.0*  HCT 45.7   < > 31.3* 25.0* 28.1*  MCV 90.7   < >  88.7  --  90.6  PLT 280   < > 190  --  138*   < > = values in this interval not displayed.   Basic Metabolic Panel:  Recent Labs  Lab 02/17/20 0449 02/17/20 1852 02/18/20 0409 02/18/20 0500  NA 134*  --  138 137  K 4.0  --  3.7 3.7  CL 101  --   --  102  CO2 24  --   --  26  GLUCOSE 196*  --   --  116*  BUN 22  --   --  22  CREATININE 1.08  --   --  0.99  CALCIUM 8.1*  --   --  8.2*  MG 2.2 1.8  --   --   PHOS 2.3* 2.1*  --   --    Lipid Panel:  Recent Labs  Lab 02/18/20 1032  CHOL 133  TRIG 97  HDL 38*  CHOLHDL 3.5  VLDL 19  LDLCALC 76   HgbA1c:  Recent Labs  Lab 02/18/20 0500  HGBA1C 6.1*    IMAGING past 24 hours  CT Head Wo  Contrast 02/17/2020 IMPRESSION:  1. Subcentimeter hyperdense focus within the right thalamic region, suspicious for a small acute bleed. MRI correlation is recommended.  2. Mild cerebral atrophy.  3. Moderate to marked severity paranasal sinus disease.  MR BRAIN WO CONTRAST MR ANGIO HEAD WO CONTRAST 02/17/2020 IMPRESSION:  1. Multiple small acute infarcts within the frontal lobes.  2. New right thalamic microhemorrhage, since 01/18/12.  3. Normal intracranial MRA.   DG CHEST PORT 1 VIEW 02/16/2020 IMPRESSION:  Low lung volumes persist, with likely atelectasis at the lung bases and no overt edema or confluent airspace disease. Unchanged position of endotracheal tube, gastric tube, right IJ sheath/Swan-Ganz.   DG Abd Portable 1V 02/17/2020 IMPRESSION:  Enteric tube in satisfactory position, nonobstructive bowel gas pattern.   ECHOCARDIOGRAM LIMITED 02/16/2020 IMPRESSIONS   1. Left ventricular ejection fraction, by estimation, is 50 to 55%. The left ventricle has mildly decreased function. The left ventricle demonstrates regional wall motion abnormalities. Posterior wall hypokinesis.   2. Impella measures about 3cm into LV cavity from aortic valve   3. Right ventricular systolic function is normal. The right ventricular size is normal.   Bilateral Carotid Dopplers  02/18/2020 Pending   PHYSICAL EXAM  General: Well-developed well-nourished male lying comfortably in bed, intubated HEENT: [place]/AT, endotracheal tube in place Cardiovascular: Rate and rhythm Pulmonary: Clear to auscultation bilaterally Abdomen: Soft, nondistended, nontender Extremities: No peripheral edema Neurological:  Mental Status: Intubated, able to arouse, unclear if oriented, follows simple commands consistently. Cranial Nerves: II III,IV, VI:  Visual fields grossly normal, pupils equal, round, reactive to light and accommodation, skew deviation R upper than left, decreased doll's eye reflex. V,VII:  normal VIII: hearing normal  IX,X: Unable to perform XI: Unable to perform XII: Unable to perform  Motor: Flaccid left upper extremity to noxious stimulation, left lower extremity has minimal flexion. Right extremities able to lift on command against gravity purposefully Sensory: Pinprick and light touch intact throughout, bilaterally Cerebellar: Unable to perform Gait: Deferred  ASSESSMENT/PLAN [name] is a 65 y.o. male with history of hypertension, hyperlipidemia and OSA presenting 02/15/20 with cardiac arrest with successful PCI and drug-eluting stent placement left circumflex complicated by hypotension (Impella device) with subsequent left sided weakness. He did not receive IV t-PA due to late presentation (>4.5 hours from time of onset).   Stroke: Multiple small acute  infarcts within frontal lobe, right thalamic with micro-hemorrhage, likely due to recent PCI.  As well as embolization from distal clot on the Impella device  CT head: Subcentimeter hyperdense focus within the right thalamic region, suspicious for a small acute bleed. MRI correlation is recommended. Mild cerebral atrophy.   MRI : Multiple small acute infarcts within the frontal lobes. New right thalamic microhemorrhage, since 01/18/12.  MRA : Normal intracranial MRA.  Carotid Doppler: pending  2D Echo: EF 50 - 55%. No cardiac source of emboli identified.   Sars Corona Virus 2 - negative  LDL 135  HgbA1c 6.1  VTE prophylaxis - none    Diet   Diet NPO time specified    No antithrombotic prior to admission, now on aspirin 81 mg daily and clopidogrel 75 mg daily.   Patient will be counseled to be compliant with his antithrombotic medications  Ongoing aggressive stroke risk factor management  Therapy recommendations:  pending  Disposition:  pending  Hypertension  Home BP meds: Zestril  Current BP meds: norepinephrine ; vasopressin  Stable  Long-term BP goal  normotensive  Hyperlipidemia  Home Lipid lowering medication: Zocor 20 mg daily  LDL 135, goal < 70  Current lipid lowering medication: consider Lipitor 80 mg daily   Continue statin at discharge   Other Active Problems, Findings, Recommendations and/or Plan  Code status -  Full  Intubated   NPO -> tube feedings  Hyperglycemia ->SSI    Anemia - Hgb - 9.0  Mild thrombocytopenia - platelets - 138  Aspiration pneumonia - Rocephin started 02/16/20   Hospital day # 3 I have personally obtained history,examined this patient, reviewed notes, independently viewed imaging studies, participated in medical decision making and plan of care.ROS completed by me personally and pertinent positives fully documented  I have made any additions or clarifications directly to the above note. Agree with note above.  He developed left-sided weakness following emergent cardiac angioplasty stenting as well as temporary placement of Impella device and a clot was found at its tip after removal which likely contributed to stroke.  Recommend dual antiplatelet therapy for a year due to his cardiac revascularization and aggressive risk factor modification.  Physical Occupational Therapy consults.  Will likely need inpatient rehab.  Check carotid ultrasound study and lipid profile.  Discussed with patient, wife and Dr. Gala Romney.  Greater than 50% time during this 35-minute visit was spent on counseling and coordination of care about a stroke and answering questions.  Delia Heady, MD Medical Director [facility] Pager: [phone] 02/18/2020 1:28 PM    To contact Stroke Continuity provider, please refer to [email or web address]. After hours, contact General Neurology

## 2020-02-18 NOTE — Progress Notes (Signed)
VASCULAR LAB    Carotid duplex has been performed.  See CV proc for preliminary results.   Angelette Ganus, RVT 02/18/2020, 1:55 PM

## 2020-02-18 NOTE — Plan of Care (Signed)
  Problem: Respiratory: Goal: Ability to maintain a clear airway and adequate ventilation will improve Outcome: Progressing   Problem: Cardiovascular: Goal: Ability to achieve and maintain adequate cardiovascular perfusion will improve Outcome: Progressing Goal: Vascular access site(s) Level 0-1 will be maintained Outcome: Progressing   Problem: Clinical Measurements: Goal: Ability to maintain clinical measurements within normal limits will improve Outcome: Progressing Goal: Will remain free from infection Outcome: Progressing Goal: Diagnostic test results will improve Outcome: Progressing Goal: Respiratory complications will improve Outcome: Progressing Goal: Cardiovascular complication will be avoided Outcome: Progressing   Problem: Pain Managment: Goal: General experience of comfort will improve Outcome: Progressing   Problem: Safety: Goal: Ability to remain free from injury will improve Outcome: Progressing   Problem: Skin Integrity: Goal: Risk for impaired skin integrity will decrease Outcome: Progressing   

## 2020-02-18 NOTE — Progress Notes (Signed)
SLP Cancellation Note  Patient Details Name: Jeffrey Morse MRN: [id number] DOB: 1955/03/02   Cancelled treatment:    SLP order received. Pt is currently still on the vent. Will follow up for evaluation post-extubation.        [name] L. [name], MA CCC/SLP Acute Rehabilitation Services Office number [phone] Pager [phone]  [name] [name] 02/18/2020, 8:53 AM

## 2020-02-18 NOTE — Progress Notes (Signed)
Inpatient Rehab Admissions Coordinator Note:   Per therapy recommendations, pt was screened for CIR candidacy by Megan Salon, MS CCC-SLP. At this time, Pt. Appears to have functional decline but is sedated on vent, tolerating only bed level therapies with total A. CIR team will follow and rescreen once pt. Is off vent and better able to participate in therapies.  Please contact me with questions.   Megan Salon, MS, CCC-SLP Rehab Admissions Coordinator  [phone] (celll) [phone] (office)

## 2020-02-18 NOTE — Evaluation (Signed)
Physical Therapy Evaluation Patient Details Name: Jeffrey Morse MRN: [id number] DOB: May 25, 1955 Today's Date: 02/18/2020   History of Present Illness  65 y.o. male hypertension, sleep apnea, hypercholesterolemia, was brought in for evaluation of what started like chest pain on 02/15/2020. Pt attempted to go to urgent care but collapsed when walking in and underwent CPR. Pt found to be in V fib by EMS, CPR of 15 minutes prior to ROSC. Pt went into PEA again in ambulance with further CPR of 5 minutes until ROSC. Pt was intubated and underwent emergent cath with successful stent placement to L circumflex artery and impella placement on 02/15/2020. 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  Clinical Impression  Pt presents to PT with deficits in arousal, functional mobility, balance, strength, power, endurance. Pt intubated and sedated upon PT arrival. RN stops sedation at start of session however pt remains lethargic throughout session and is very inconsistent with his ability to follow commands. Pt requires totalA with all mobility, only wiggling his toes and squeezing his R hand currently. Pt will benefit from continued acute PT services to improve functional mobility quality and to reduce falls risk. Pt will likely need post-acute inpatient therapies. PT will be better able to evaluate the optimal setting for these therapies when the pt is less sedated and better able to participate in session.    Follow Up Recommendations CIR;SNF (CIR vs SNF pending progress, likely better to discern when less sedated)    Equipment Recommendations  Wheelchair (measurements PT);Wheelchair cushion (measurements PT);Hospital bed;Other (comment) (mechanical lift)    Recommendations for Other Services       Precautions / Restrictions Precautions Precautions: Fall Precaution Comments: swann Restrictions Weight Bearing Restrictions: No      Mobility  Bed Mobility Overal bed mobility:  Needs Assistance Bed Mobility: Supine to Sit;Sit to Supine     Supine to sit: Total assist;+2 for physical assistance;HOB elevated Sit to supine: Total assist;+2 for physical assistance;HOB elevated        Transfers                    Ambulation/Gait                Stairs            Wheelchair Mobility    Modified Rankin (Stroke Patients Only)       Balance Overall balance assessment: Needs assistance Sitting-balance support: No upper extremity supported;Feet unsupported Sitting balance-Leahy Scale: Zero Sitting balance - Comments: totalA for all sitting balance due to posterior lean, no head control noted Postural control: Posterior lean                                   Pertinent Vitals/Pain Pain Assessment: Faces Faces Pain Scale: No hurt    Home Living Family/patient expects to be discharged to:: Private residence Living Arrangements: Spouse/significant other Available Help at Discharge: Family (unsure about availability) Type of Home: Mobile home Home Access: Stairs to enter Entrance Stairs-Rails: Can reach both Entrance Stairs-Number of Steps: 3 Home Layout: One level Home Equipment: None      Prior Function Level of Independence: Independent         Comments: independent, works in [employer], working the day prior to MI     [employer]        Extremity/Trunk Assessment   Upper Extremity Assessment Upper Extremity Assessment: Defer  to OT evaluation    Lower Extremity Assessment Lower Extremity Assessment: RLE deficits/detail;LLE deficits/detail RLE Deficits / Details: pt wiggles toes of RLE but otherwise no AROM is noted, PROM WFL, no response to painful stimuli, no increased tone noted LLE Deficits / Details: pt wiggles toes of RLE but otherwise no AROM is noted, PROM WFL, no response to painful stimuli, no increased tone noted    Cervical / Trunk Assessment Cervical / Trunk Assessment: Kyphotic   Communication   Communication: Other (comment) (intubated)  Cognition Arousal/Alertness: Lethargic;Suspect due to medications (pt initially on fentanyl drip upon PT arrival, RN stops sedation early in session but pt remains lethargic throughout) Behavior During Therapy: Flat affect Overall Cognitive Status: Difficult to assess                                 General Comments: pt is lethargic, inconsistently following commands. Pt does squeeze PTs hand during session and wiggles toes on both sides but does not follow other commands for UE or LE movement. No noted AROM of LUE. Pt does not actively open eyes during session and does not track when PT or OT open pts eyes.      General Comments General comments (skin integrity, edema, etc.): VSS during session, pt intubated on 40% FiO2 with peep of 6    Exercises     Assessment/Plan    PT Assessment Patient needs continued PT services  PT Problem List Decreased strength;Decreased activity tolerance;Decreased balance;Decreased mobility;Decreased cognition;Cardiopulmonary status limiting activity       PT Treatment Interventions DME instruction;Gait training;Functional mobility training;Stair training;Therapeutic activities;Therapeutic exercise;Balance training;Neuromuscular re-education;Cognitive remediation;Patient/family education;Wheelchair mobility training    PT Goals (Current goals can be found in the Care Plan section)  Acute Rehab PT Goals Patient Stated Goal: pt unable to state (PT goal to improve functional mobility and transfer quality to reduced caregiver burden and improve independence in household mobility) PT Goal Formulation: Patient unable to participate in goal setting Time For Goal Achievement: 03/03/20 Potential to Achieve Goals: Fair    Frequency Min 4X/week   Barriers to discharge        Co-evaluation PT/OT/SLP Co-Evaluation/Treatment: Yes Reason for Co-Treatment: Complexity of the patient's  impairments (multi-system involvement);For patient/therapist safety PT goals addressed during session: Mobility/safety with mobility;Balance;Strengthening/ROM OT goals addressed during session: ADL's and self-care       AM-PAC PT "6 Clicks" Mobility  Outcome Measure Help needed turning from your back to your side while in a flat bed without using bedrails?: Total Help needed moving from lying on your back to sitting on the side of a flat bed without using bedrails?: Total Help needed moving to and from a bed to a chair (including a wheelchair)?: Total Help needed standing up from a chair using your arms (e.g., wheelchair or bedside chair)?: Total Help needed to walk in hospital room?: Total Help needed climbing 3-5 steps with a railing? : Total 6 Click Score: 6    End of Session Equipment Utilized During Treatment: Oxygen Activity Tolerance: Patient limited by lethargy Patient left: in bed;with call bell/phone within reach;with bed alarm set;with nursing/sitter in room Nurse Communication: Mobility status;Need for lift equipment PT Visit Diagnosis: Other abnormalities of gait and mobility (R26.89);Other symptoms and signs involving the nervous system (R29.898)    Time: 3295-1884 PT Time Calculation (min) (ACUTE ONLY): 18 min   Charges:   PT Evaluation $PT Eval High Complexity: 1 High  Arlyss Gandy, PT, DPT Acute Rehabilitation Pager: [phone]   Arlyss Gandy 02/18/2020, 12:20 PM

## 2020-02-19 ENCOUNTER — Inpatient Hospital Stay (HOSPITAL_COMMUNITY): Payer: BC Managed Care – PPO

## 2020-02-19 DIAGNOSIS — J9601 Acute respiratory failure with hypoxia: Secondary | ICD-10-CM | POA: Diagnosis not present

## 2020-02-19 DIAGNOSIS — I6330 Cerebral infarction due to thrombosis of unspecified cerebral artery: Secondary | ICD-10-CM | POA: Diagnosis not present

## 2020-02-19 LAB — CBC
HCT: 30.7 % — ABNORMAL LOW (ref 39.0–52.0)
Hemoglobin: 9.9 g/dL — ABNORMAL LOW (ref 13.0–17.0)
MCH: 29.5 pg (ref 26.0–34.0)
MCHC: 32.2 g/dL (ref 30.0–36.0)
MCV: 91.4 fL (ref 80.0–100.0)
Platelets: 154 10*3/uL (ref 150–400)
RBC: 3.36 MIL/uL — ABNORMAL LOW (ref 4.22–5.81)
RDW: 14.1 % (ref 11.5–15.5)
WBC: 7.5 10*3/uL (ref 4.0–10.5)
nRBC: 0 % (ref 0.0–0.2)

## 2020-02-19 LAB — CULTURE, RESPIRATORY W GRAM STAIN: Culture: NORMAL

## 2020-02-19 LAB — GLUCOSE, CAPILLARY
Glucose-Capillary: 107 mg/dL — ABNORMAL HIGH (ref 70–99)
Glucose-Capillary: 110 mg/dL — ABNORMAL HIGH (ref 70–99)
Glucose-Capillary: 115 mg/dL — ABNORMAL HIGH (ref 70–99)
Glucose-Capillary: 86 mg/dL (ref 70–99)
Glucose-Capillary: 92 mg/dL (ref 70–99)
Glucose-Capillary: 97 mg/dL (ref 70–99)

## 2020-02-19 LAB — BASIC METABOLIC PANEL
Anion gap: 12 (ref 5–15)
BUN: 21 mg/dL (ref 8–23)
CO2: 24 mmol/L (ref 22–32)
Calcium: 8.3 mg/dL — ABNORMAL LOW (ref 8.9–10.3)
Chloride: 101 mmol/L (ref 98–111)
Creatinine, Ser: 0.91 mg/dL (ref 0.61–1.24)
GFR, Estimated: >60 mL/min (ref >60)
Glucose, Bld: 99 mg/dL (ref 70–99)
Potassium: 3.5 mmol/L (ref 3.5–5.1)
Sodium: 137 mmol/L (ref 135–145)

## 2020-02-19 LAB — COOXEMETRY PANEL
Carboxyhemoglobin: 1.1 % (ref 0.5–1.5)
Methemoglobin: 1 % (ref 0.0–1.5)
O2 Saturation: 66.3 %
Total hemoglobin: 9.6 g/dL — ABNORMAL LOW (ref 12.0–16.0)

## 2020-02-19 MED ORDER — SPIRONOLACTONE 12.5 MG HALF TABLET: 12.5000 mg | ORAL_TABLET | Freq: Every day | ORAL | Status: DC

## 2020-02-19 MED ORDER — DEXMEDETOMIDINE HCL IN NACL 400 MCG/100ML IV SOLN
0.4000 ug/kg/h | INTRAVENOUS | Status: DC
  Administered 2020-02-19: 1 ug/kg/h via INTRAVENOUS
  Administered 2020-02-19: 0.4 ug/kg/h via INTRAVENOUS
  Administered 2020-02-19 – 2020-02-20 (×5): 1 ug/kg/h via INTRAVENOUS
  Administered 2020-02-20: 0.4 ug/kg/h via INTRAVENOUS
  Administered 2020-02-21: 1 ug/kg/h via INTRAVENOUS
  Administered 2020-02-21: 1.1 ug/kg/h via INTRAVENOUS
  Filled 2020-02-19 (×8): qty 100

## 2020-02-19 MED ORDER — SACUBITRIL-VALSARTAN 24-26 MG PO TABS
1.0000 | ORAL_TABLET | Freq: Two times a day (BID) | ORAL | Status: DC
  Administered 2020-02-19: 1
  Filled 2020-02-19 (×2): qty 1

## 2020-02-19 MED ORDER — VANCOMYCIN HCL 1250 MG/250ML IV SOLN
1250.0000 mg | Freq: Two times a day (BID) | INTRAVENOUS | Status: DC
  Administered 2020-02-20 – 2020-02-22 (×4): 1250 mg via INTRAVENOUS
  Filled 2020-02-19 (×5): qty 250

## 2020-02-19 MED ORDER — SPIRONOLACTONE 12.5 MG HALF TABLET
12.5000 mg | ORAL_TABLET | Freq: Every day | ORAL | Status: DC
  Administered 2020-02-19 – 2020-02-21 (×3): 12.5 mg
  Filled 2020-02-19 (×3): qty 1

## 2020-02-19 MED ORDER — HYDRALAZINE HCL 20 MG/ML IJ SOLN
INTRAMUSCULAR | Status: AC
  Filled 2020-02-19: qty 1

## 2020-02-19 MED ORDER — SODIUM CHLORIDE 0.9 % IV SOLN
2.0000 g | Freq: Three times a day (TID) | INTRAVENOUS | Status: AC
  Administered 2020-02-19 – 2020-02-25 (×19): 2 g via INTRAVENOUS
  Filled 2020-02-19 (×19): qty 2

## 2020-02-19 MED ORDER — NOREPINEPHRINE 16 MG/250ML-% IV SOLN: 0.0000 ug/min | INTRAVENOUS | Status: DC

## 2020-02-19 MED ORDER — POTASSIUM CHLORIDE CRYS ER 10 MEQ PO TBCR: 40.0000 meq | EXTENDED_RELEASE_TABLET | Freq: Two times a day (BID) | ORAL | Status: DC

## 2020-02-19 MED ORDER — FUROSEMIDE 10 MG/ML IJ SOLN
80.0000 mg | Freq: Two times a day (BID) | INTRAMUSCULAR | Status: DC
  Administered 2020-02-19 – 2020-02-20 (×4): 80 mg via INTRAVENOUS
  Filled 2020-02-19 (×5): qty 8

## 2020-02-19 MED ORDER — VANCOMYCIN HCL 1500 MG/300ML IV SOLN
1500.0000 mg | Freq: Once | INTRAVENOUS | Status: AC
  Administered 2020-02-19: 1500 mg via INTRAVENOUS
  Filled 2020-02-19: qty 300

## 2020-02-19 MED ORDER — NOREPINEPHRINE 4 MG/250ML-% IV SOLN: 0.0000 ug/min | INTRAVENOUS | Status: DC

## 2020-02-19 MED ORDER — POTASSIUM CHLORIDE 10 MEQ/100ML IV SOLN
10.0000 meq | INTRAVENOUS | Status: AC
  Administered 2020-02-19 (×2): 10 meq via INTRAVENOUS
  Filled 2020-02-19 (×2): qty 100

## 2020-02-19 MED ORDER — METOCLOPRAMIDE HCL 5 MG/ML IJ SOLN
10.0000 mg | Freq: Two times a day (BID) | INTRAMUSCULAR | Status: DC
  Administered 2020-02-19 (×2): 10 mg via INTRAVENOUS
  Filled 2020-02-19 (×2): qty 2

## 2020-02-19 MED ORDER — POTASSIUM CHLORIDE 20 MEQ PO PACK
40.0000 meq | PACK | Freq: Two times a day (BID) | ORAL | Status: DC
  Administered 2020-02-19 (×2): 40 meq
  Filled 2020-02-19 (×2): qty 2

## 2020-02-19 MED ORDER — POLYETHYLENE GLYCOL 3350 17 G PO PACK
17.0000 g | PACK | Freq: Two times a day (BID) | ORAL | Status: DC
  Administered 2020-02-19 – 2020-02-21 (×6): 17 g
  Filled 2020-02-19 (×6): qty 1

## 2020-02-19 NOTE — Treatment Plan (Signed)
[name] is febrile at 102.4, WBC this AM 7.5, currently on ceftriaxone for aspiration PNA.  CXR today showed diffuse atelectasis suspected with mild interstitial edema vs viral/atypical respiratory infection.    Will obtain urine cx, blood cx and resp cx.  Will broadened abx with vanco and cefepime.  Would not start azithromycin due to prolong QT VT arrest during hospitalization.  Discussed with Pharm and Dr. Gala Romney.   Also, discussed with RN getting patient to CT scan tonight to re-evaluate CVA.

## 2020-02-19 NOTE — Progress Notes (Addendum)
Advanced Heart Failure Rounding Note   Subjective:    1/12 admitted for Vfib arrest, cath PCI/DES and impella 1/14 VT arrest, ROSC after 3 minutes.  1/15 Impella removed 1/16 Left sided weakness. MRI with multiple small infarcts and small ICH  On amio 30, fentanyl 250, versed restarted on 0.5; FiO2 50%, CVP 14, co-ox 66%.  Wakes with verbal/tactile stimuli, able to squeeze hands but does not make eye contact.  Overnight, agitated on fentanyl BP still elevated, restarted versed gtt for sedation per PCCM.    Echo EF ~50-55% LV small RV ok.   Objective:   Weight Range:  Vital Signs:   Temp:  [98.8 F (37.1 C)-101.1 F (38.4 C)] 98.9 F (37.2 C) (01/16 0300) Pulse Rate:  [57-95] 61 (01/16 0500) Resp:  [13-24] 18 (01/16 0500) BP: (97-212)/(51-103) 97/65 (01/16 0500) SpO2:  [90 %-100 %] 97 % (01/16 0500) Arterial Line BP: (103-170)/(46-89) 114/55 (01/16 0500) FiO2 (%):  [40 %-60 %] 50 % (01/16 0335) Weight:  [102.4 kg] 102.4 kg (01/16 0420) Last BM Date:  (UTA)  Weight change: Filed Weights   02/17/20 0615 02/18/20 0500 02/19/20 0420  Weight: 106.7 kg 104.2 kg 102.4 kg    Intake/Output:   Intake/Output Summary (Last 24 hours) at 02/19/2020 0600 Last data filed at 02/19/2020 0500 Gross per 24 hour  Intake 1724.35 ml  Output 1219 ml  Net 505.35 ml     Physical Exam: CVP 14 General:  ill HEENT: intubated Neck: supple. + JVD.  Cor: Regular rate & rhythm. No rubs, gallops or murmurs. Lungs: Diminished Abdomen: soft,  distended.  Good bowel sounds. Extremities: no cyanosis, clubbing, rash. BUE edema Neuro: alert, intubated and sedated.    Telemetry: Sinus rhythm rates 60s.  Personally reviewed.    Labs: Basic Metabolic Panel: Recent Labs  Lab 02/15/20 1523 02/15/20 1658 02/15/20 1923 02/15/20 2133 02/16/20 1618 02/17/20 0116 02/17/20 0118 02/17/20 0449 02/17/20 1852 02/18/20 0409 02/18/20 0500 02/19/20 0427  NA 138   < > 136   < > 137   < > 137  134*  --  138 137 137  K 3.2*   < > 4.2   < > 4.0   < > 3.3* 4.0  --  3.7 3.7 3.5  CL 103  --  108   < > 104  --  103 101  --   --  102 101  CO2 22  --  17*   < > 23  --  23 24  --   --  26 24  GLUCOSE 333*  --  239*   < > 164*  --  239* 196*  --   --  116* 99  BUN 19  --  21   < > 22  --  21 22  --   --  22 21  CREATININE 1.08  --  1.28*   < > 1.15  --  1.28* 1.08  --   --  0.99 0.91  CALCIUM 8.1*  --  8.2*   < > 7.7*  --  7.8* 8.1*  --   --  8.2* 8.3*  MG 1.7  --  2.0  --  1.7  --   --  2.2 1.8  --   --   --   PHOS  --   --   --   --  3.2  --   --  2.3* 2.1*  --   --   --    < > =  values in this interval not displayed.    Liver Function Tests: Recent Labs  Lab 02/15/20 1114 02/16/20 0323  AST 73* 175*  ALT 54* 53*  ALKPHOS 91 67  BILITOT 0.8 0.6  PROT 6.1* 5.2*  ALBUMIN 3.5 2.9*   No results for input(s): LIPASE, AMYLASE in the last 168 hours. No results for input(s): AMMONIA in the last 168 hours.  CBC: Recent Labs  Lab 02/15/20 1114 02/15/20 1140 02/16/20 0323 02/16/20 1618 02/17/20 0116 02/17/20 0449 02/18/20 0409 02/18/20 0500 02/19/20 0427  WBC 14.2*   < > 18.5* 11.3*  --  11.5*  --  8.1 7.5  NEUTROABS 7.8*  --   --   --   --   --   --   --   --   HGB 14.4   < > 11.9* 11.2* 10.2* 10.1* 8.5* 9.0* 9.9*  HCT 45.7   < > 36.3* 32.5* 30.0* 31.3* 25.0* 28.1* 30.7*  MCV 90.7   < > 87.9 86.7  --  88.7  --  90.6 91.4  PLT 280   < > 257 212  --  190  --  138* 154   < > = values in this interval not displayed.    Cardiac Enzymes: No results for input(s): CKTOTAL, CKMB, CKMBINDEX, TROPONINI in the last 168 hours.  BNP: BNP (last 3 results) No results for input(s): BNP in the last 8760 hours.  ProBNP (last 3 results) No results for input(s): PROBNP in the last 8760 hours.    Other results:  Imaging: CT Head Wo Contrast  Result Date: 02/17/2020 CLINICAL DATA:  Altered mental status. EXAM: CT HEAD WITHOUT CONTRAST TECHNIQUE: Contiguous axial images were  obtained from the base of the skull through the vertex without intravenous contrast. COMPARISON:  January 17, 2012 FINDINGS: Brain: There is mild cerebral atrophy with widening of the extra-axial spaces and ventricular dilatation. There are areas of decreased attenuation within the white matter tracts of the supratentorial brain, consistent with microvascular disease changes. A 4 mm x 4 mm x 6 mm hyperdense focus is seen within the thalamic region on the right (axial CT image 19). This represents a new finding when compared to the prior study. A trace amount of adjacent anterior white matter low attenuation is noted. There is no evidence of associated mass effect or midline shift. Vascular: No hyperdense vessel or unexpected calcification. Skull: Normal. Negative for fracture or focal lesion. Sinuses/Orbits: There is moderate to marked severity bilateral ethmoid sinus and nasal mucosal thickening. Moderate sized air-fluid levels are seen within the right maxillary sinus and sphenoid sinus. Other: Endotracheal and nasogastric tubes are seen. IMPRESSION: 1. Subcentimeter hyperdense focus within the right thalamic region, suspicious for a small acute bleed. MRI correlation is recommended. 2. Mild cerebral atrophy. 3. Moderate to marked severity paranasal sinus disease. Electronically Signed   By: Virgina Norfolk M.D.   On: 02/17/2020 17:16   MR ANGIO HEAD WO CONTRAST  Result Date: 02/17/2020 CLINICAL DATA:  Anoxic brain injury. EXAM: MRI HEAD WITHOUT CONTRAST MRA HEAD WITHOUT CONTRAST TECHNIQUE: Multiplanar, multiecho pulse sequences of the brain and surrounding structures were obtained without intravenous contrast. Angiographic images of the head were obtained using MRA technique without contrast. COMPARISON:  Brain MRI 01/18/2012 FINDINGS: MRI HEAD FINDINGS Brain: There are multiple small acute infarcts within the frontal lobes. There are microhemorrhages in the right thalamus and left parietal lobe. The left  parietal hemorrhage is unchanged, but the right thalamic hemorrhage is new. Aside  from the above described lesions, the white matter signal is normal. The midline structures are normal. Vascular: Major flow voids are preserved. Skull and upper cervical spine: Normal calvarium and skull base. Visualized upper cervical spine and soft tissues are normal. Sinuses/Orbits:Fluid in the ethmoid and sphenoid sinuses and right maxillary sinus. Bilateral mastoid effusions. Normal orbits. MRA HEAD FINDINGS POSTERIOR CIRCULATION: --Vertebral arteries: Normal --Inferior cerebellar arteries: Normal. --Basilar artery: Normal. --Superior cerebellar arteries: Normal. --Posterior cerebral arteries: Normal. ANTERIOR CIRCULATION: --Intracranial internal carotid arteries: Normal. --Anterior cerebral arteries (ACA): Normal. --Middle cerebral arteries (MCA): Normal. ANATOMIC VARIANTS: None IMPRESSION: 1. Multiple small acute infarcts within the frontal lobes. 2. New right thalamic microhemorrhage, since 01/18/12. 3. Normal intracranial MRA. Electronically Signed   By: Ulyses Jarred M.D.   On: 02/17/2020 23:14   MR BRAIN WO CONTRAST  Result Date: 02/17/2020 CLINICAL DATA:  Anoxic brain injury. EXAM: MRI HEAD WITHOUT CONTRAST MRA HEAD WITHOUT CONTRAST TECHNIQUE: Multiplanar, multiecho pulse sequences of the brain and surrounding structures were obtained without intravenous contrast. Angiographic images of the head were obtained using MRA technique without contrast. COMPARISON:  Brain MRI 01/18/2012 FINDINGS: MRI HEAD FINDINGS Brain: There are multiple small acute infarcts within the frontal lobes. There are microhemorrhages in the right thalamus and left parietal lobe. The left parietal hemorrhage is unchanged, but the right thalamic hemorrhage is new. Aside from the above described lesions, the white matter signal is normal. The midline structures are normal. Vascular: Major flow voids are preserved. Skull and upper cervical spine:  Normal calvarium and skull base. Visualized upper cervical spine and soft tissues are normal. Sinuses/Orbits:Fluid in the ethmoid and sphenoid sinuses and right maxillary sinus. Bilateral mastoid effusions. Normal orbits. MRA HEAD FINDINGS POSTERIOR CIRCULATION: --Vertebral arteries: Normal --Inferior cerebellar arteries: Normal. --Basilar artery: Normal. --Superior cerebellar arteries: Normal. --Posterior cerebral arteries: Normal. ANTERIOR CIRCULATION: --Intracranial internal carotid arteries: Normal. --Anterior cerebral arteries (ACA): Normal. --Middle cerebral arteries (MCA): Normal. ANATOMIC VARIANTS: None IMPRESSION: 1. Multiple small acute infarcts within the frontal lobes. 2. New right thalamic microhemorrhage, since 01/18/12. 3. Normal intracranial MRA. Electronically Signed   By: Ulyses Jarred M.D.   On: 02/17/2020 23:14   DG Abd Portable 1V  Result Date: 02/17/2020 CLINICAL DATA:  Ileus EXAM: PORTABLE ABDOMEN - 1 VIEW COMPARISON:  Abdominal radiograph February 15, 2020. FINDINGS: Enteric tube with tip and side port below the diaphragm in stomach. Defibrillator leads overlie the left upper quadrant. The bowel gas pattern is normal. Osseous structures are unchanged. IMPRESSION: Enteric tube in satisfactory position, nonobstructive bowel gas pattern. Electronically Signed   By: Dahlia Bailiff MD   On: 02/17/2020 18:18   VAS US CAROTID  Result Date: 02/18/2020 Carotid Arterial Duplex Study Indications:       CVA. Risk Factors:      Hypertension, hyperlipidemia, prior MI. Other Factors:     Post cardiac arrest. Limitations        Today's exam was limited due to a central line, bandages and                    patient on a ventilator. Comparison Study:  No prior study Performing Technologist: Sharion Dove RVS  Examination Guidelines: A complete evaluation includes B-mode imaging, spectral Doppler, color Doppler, and power Doppler as needed of all accessible portions of each vessel. Bilateral testing is  considered an integral part of a complete examination. Limited examinations for reoccurring indications may be performed as noted.  Right Carotid Findings: +----------+--------+--------+--------+------------------+--------+  PSV cm/sEDV cm/sStenosisPlaque DescriptionComments +----------+--------+--------+--------+------------------+--------+ CCA Prox  81      21                                         +----------+--------+--------+--------+------------------+--------+ CCA Distal50      15                                         +----------+--------+--------+--------+------------------+--------+ ICA Prox  76      24                                         +----------+--------+--------+--------+------------------+--------+ ICA Distal60      24                                         +----------+--------+--------+--------+------------------+--------+ ECA       48      14                                         +----------+--------+--------+--------+------------------+--------+ +----------+--------+-------+--------+-------------------+           PSV cm/sEDV cmsDescribeArm Pressure (mmHG) +----------+--------+-------+--------+-------------------+ Subclavian28                                         +----------+--------+-------+--------+-------------------+  Left Carotid Findings: +----------+--------+--------+--------+------------------+--------+           PSV cm/sEDV cm/sStenosisPlaque DescriptionComments +----------+--------+--------+--------+------------------+--------+ CCA Prox  100     24                                         +----------+--------+--------+--------+------------------+--------+ CCA Distal97      28                                         +----------+--------+--------+--------+------------------+--------+ ICA Prox  80      28              focal and calcific          +----------+--------+--------+--------+------------------+--------+ ICA Distal46      19                                         +----------+--------+--------+--------+------------------+--------+ ECA       102     20                                         +----------+--------+--------+--------+------------------+--------+ +----------+--------+--------+--------+-------------------+           PSV cm/sEDV cm/sDescribeArm Pressure (mmHG) +----------+--------+--------+--------+-------------------+ [id number]                                          +----------+--------+--------+--------+-------------------+ +---------+--------+--+--------+--+  VertebralPSV cm/s50EDV cm/s22 +---------+--------+--+--------+--+   Summary: Right Carotid: The extracranial vessels were near-normal with only minimal wall                thickening or plaque. Left Carotid: The extracranial vessels were near-normal with only minimal wall               thickening or plaque. Vertebrals:  Left vertebral artery demonstrates antegrade flow. Right vertebral              artery was not visualized. Subclavians: Normal flow hemodynamics were seen in bilateral subclavian              arteries. *See table(s) above for measurements and observations.     Preliminary    [place] EKG SITE RITE  Result Date: 02/18/2020 If Site Rite image not attached, placement could not be confirmed due to current cardiac rhythm.    Medications:     Scheduled Medications: . aspirin  81 mg Per Tube Daily  . chlorhexidine gluconate (MEDLINE KIT)  15 mL Mouth Rinse BID  . Chlorhexidine Gluconate Cloth  6 each Topical Daily  . clopidogrel  75 mg Per Tube Daily  . docusate  100 mg Per Tube BID  . feeding supplement (PROSource TF)  90 mL Per Tube TID  . fentaNYL (SUBLIMAZE) injection  50 mcg Intravenous Once  . insulin aspart  0-15 Units Subcutaneous Q4H  . mouth rinse  15 mL Mouth Rinse 10 times per day  . metoCLOPramide (REGLAN)  injection  10 mg Intravenous Daily  . neostigmine  0.25 mg Subcutaneous Q6H  . pantoprazole (PROTONIX) IV  40 mg Intravenous Daily  . polyethylene glycol  17 g Per Tube Daily  . senna-docusate  1 tablet Per Tube BID  . sodium chloride flush  10-40 mL Intracatheter Q12H  . sodium chloride flush  3 mL Intravenous Q12H  . sorbitol  60 mL Per Tube Q0600    Infusions: . sodium chloride    . amiodarone 30 mg/hr (02/19/20 0500)  . cefTRIAXone (ROCEPHIN)  IV Stopped (02/18/20 1217)  . feeding supplement (VITAL 1.5 CAL) Stopped (02/17/20 1700)  . fentaNYL infusion INTRAVENOUS 250 mcg/hr (02/19/20 0500)  . lactated ringers 20 mL/hr at 02/19/20 0500  . midazolam 0.5 mg/hr (02/19/20 0157)  . norepinephrine (LEVOPHED) Adult infusion Stopped (02/19/20 0412)  . vasopressin Stopped (02/18/20 1000)    PRN Medications: sodium chloride, sodium chloride, acetaminophen **OR** acetaminophen (TYLENOL) oral liquid 160 mg/5 mL **OR** acetaminophen, fentaNYL, midazolam, ondansetron (ZOFRAN) IV, sodium chloride flush, sodium chloride flush   Assessment/Plan:   1. Cardiac (VTarrest in setting of acute inferolateral STEMI/VT - OOH cardiac arrest on 1/12 with prolonged CPR, resuscitated w/ CPR + defibrillation   - Emergent cath 1/12 w/ totally occluded mid LCx treated w/ PCI + DES - Recurrent VT on 1/14 -> defib x 1 - Continues on amio. (watch QT)  2. Acute systolic HF due in setting of acute MI -> cardiogenic shock - EF 25% in cath lab 1/12 - Echo 1/13 EF 50-55% - Impella pulled 1/14 - Co-ox 66%, CVP 14,BUE edema - Will discuss starting diuretics.   3. CAD with acute inferolateral  STEMI  - Emergent cath 1/12 w/ totally occluded mid LCx treated w/ PCI + DES - There is moderate ostial LAD disease and significant disease in the distal LAD close to the apex. No significant disease affecting the right coronary artery. - Continue DAPT. Brilinta switched to Plavix - No b-blocker yet with  shock  4. Acute  CVA with ICH - left-sided weakness - MRI with multiple small infarcts and small ICH (Likely cardioembolic from arrest) - Neuro following - Brilinta switched to Plavix - Keep SBP 110-140 - PT/OT following  5. Acute hypoxic respiratory failure in setting of cardiac arrest - CXR atelectasis, CPT 8.54-->6 -> 3.7; FiO2 50% - On ceftriaxone (D3) - Continue to wean vent. Diurese gently.   6, Ileus - difficult situation. QT 486 with recent VT but needs to hold down Plavix (not candidate for cangrelor due to [place]). Will give daily Reglan and watch QT. Continue stool softeners and laxatives  7. AKI - due to ATN -> resolved  8. DM2 - HgbA1c 6.0, -SSI - Add Jardiance prior to d/c  Length of Stay: [place] NP 02/19/2020, 6:00 AM  Advanced Heart Failure Team Pager [phone] (M-F; 7a - 4p)  Please contact [facility] for night-coverage after hours (4p -7a ) and weekends on [email or web address]  .Agree with above.    Remains on vent. FiO2 down to 50%. Will awaken and follow commands. Weak on left but able to squeeze hand. Gets agitated at times and BP spikes up. Co-ox looks good. CVP still high.   General:  Sedated on vent  HEENT: normal + ETT Neck: supple. JVP up Carotids 2+ bilat; no bruits. No lymphadenopathy or thryomegaly appreciated. Cor: PMI nondisplaced. Regular rate & rhythm. No rubs, gallops or murmurs. Lungs: clear Abdomen: soft, nontender, nondistended. No hepatosplenomegaly. No bruits or masses. Good bowel sounds. Extremities: no cyanosis, clubbing, rash, edema Neuro currently sedated on vent   Remains volume overloaded. Off pressors. Co-ox ok. BP labile with agitation. Will diurese today in hopes of weaning vent. Add spiro as well. Need to watch BP control given small ICH. Will need repeat imaging per Neuro.   Still with ileus but seems to be holding meds down.   CRITICAL CARE Performed by: Glori Bickers  Total critical care time: 35 minutes  Critical care  time was exclusive of separately billable procedures and treating other patients.  Critical care was necessary to treat or prevent imminent or life-threatening deterioration.  Critical care was time spent personally by me (independent of midlevel providers or residents) on the following activities: development of treatment plan with patient and/or surrogate as well as nursing, discussions with consultants, evaluation of patient's response to treatment, examination of patient, obtaining history from patient or surrogate, ordering and performing treatments and interventions, ordering and review of laboratory studies, ordering and review of radiographic studies, pulse oximetry and re-evaluation of patient's condition.   Glori Bickers, MD  6:44 AM

## 2020-02-19 NOTE — Progress Notes (Signed)
STROKE TEAM PROGRESS NOTE     INTERVAL HISTORY Patient remains sedated and intubated for respiratory failure.  He gets intermittently agitated with ventilator desynchrony requiring fentanyl.  Blood pressure has been quite labile requiring at times norepinephrine drip versus antihypertensives.  Repeat CT scan has not been done.  Wife is at the bedside. Carotid ultrasound shows no significant extracranial stenosis on either side. Vitals:   02/19/20 0400 02/19/20 0420 02/19/20 0430 02/19/20 0500  BP: 136/83  116/70 97/65  Pulse: 78  73 61  Resp: 18  17 18   Temp:      TempSrc:      SpO2: 95%  90% 97%  Weight:  102.4 kg    Height:       CBC:  Recent Labs  Lab 02/15/20 1114 02/15/20 1140 02/18/20 0500 02/19/20 0427  WBC 14.2*   < > 8.1 7.5  NEUTROABS 7.8*  --   --   --   HGB 14.4   < > 9.0* 9.9*  HCT 45.7   < > 28.1* 30.7*  MCV 90.7   < > 90.6 91.4  PLT 280   < > 138* 154   < > = values in this interval not displayed.   Basic Metabolic Panel:  Recent Labs  Lab 02/17/20 0449 02/17/20 1852 02/18/20 0409 02/18/20 0500 02/19/20 0427  NA 134*  --    < > 137 137  K 4.0  --    < > 3.7 3.5  CL 101  --   --  102 101  CO2 24  --   --  26 24  GLUCOSE 196*  --   --  116* 99  BUN 22  --   --  22 21  CREATININE 1.08  --   --  0.99 0.91  CALCIUM 8.1*  --   --  8.2* 8.3*  MG 2.2 1.8  --   --   --   PHOS 2.3* 2.1*  --   --   --    < > = values in this interval not displayed.   Lipid Panel:  Recent Labs  Lab 02/18/20 1032  CHOL 133  TRIG 97  HDL 38*  CHOLHDL 3.5  VLDL 19  LDLCALC 76   HgbA1c:  Recent Labs  Lab 02/18/20 0500  HGBA1C 6.1*    IMAGING past 24 hours  CT Head Wo Contrast 02/17/2020 IMPRESSION:  1. Subcentimeter hyperdense focus within the right thalamic region, suspicious for a small acute bleed. MRI correlation is recommended.  2. Mild cerebral atrophy.  3. Moderate to marked severity paranasal sinus disease.  MR BRAIN WO CONTRAST MR ANGIO HEAD WO  CONTRAST 02/17/2020 IMPRESSION:  1. Multiple small acute infarcts within the frontal lobes.  2. New right thalamic microhemorrhage, since 01/18/12.  3. Normal intracranial MRA.   DG CHEST PORT 1 VIEW 02/16/2020 IMPRESSION:  Low lung volumes persist, with likely atelectasis at the lung bases and no overt edema or confluent airspace disease. Unchanged position of endotracheal tube, gastric tube, right IJ sheath/Swan-Ganz.   DG Abd Portable 1V 02/17/2020 IMPRESSION:  Enteric tube in satisfactory position, nonobstructive bowel gas pattern.   ECHOCARDIOGRAM LIMITED 02/16/2020 IMPRESSIONS   1. Left ventricular ejection fraction, by estimation, is 50 to 55%. The left ventricle has mildly decreased function. The left ventricle demonstrates regional wall motion abnormalities. Posterior wall hypokinesis.   2. Impella measures about 3cm into LV cavity from aortic valve   3. Right ventricular systolic function is normal. The  right ventricular size is normal.   Bilateral Carotid Dopplers  02/18/2020 Bilateral mild thickening without hemodynamically significant stenosis.  Vertebral artery flow is antegrade on the left not visualized on the right  PHYSICAL EXAM  General: Well-developed well-nourished male who is intubated HEENT: [place]/AT, endotracheal tube in place Cardiovascular: Rate and rhythm Pulmonary: Clear to auscultation bilaterally Abdomen: Soft, nondistended, nontender Extremities: No peripheral edema Neurological:  Mental Status: Intubated, sedated on fentanyl drip arouses minimally to sternal rub does not follow any commands. Cranial Nerves: II III,IV, VI: Does not blink to threat on either side., pupils equal, round, reactive to light and accommodation, slight skew deviation with right eye hypertropia.   V,VII: normal VIII: hearing normal  IX,X: Unable to perform XI: Unable to perform XII: Unable to perform  Motor: Flaccid left upper extremity to noxious stimulation, left  lower extremity has minimal flexion. Right extremities able to lift  against gravity purposefully upon stimulation Sensory: Pinprick and light touch intact throughout, bilaterally Cerebellar: Unable to perform Gait: Deferred  ASSESSMENT/PLAN [name] is a 65 y.o. male with history of hypertension, hyperlipidemia and OSA presenting 02/15/20 with cardiac arrest with successful PCI and drug-eluting stent placement left circumflex complicated by hypotension (Impella device) with subsequent left sided weakness. He did not receive IV t-PA due to late presentation (>4.5 hours from time of onset).   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  As well as embolization from distal clot on the Impella device  CT head: Subcentimeter hyperdense focus within the right thalamic region, suspicious for a small acute bleed. MRI correlation is recommended. Mild cerebral atrophy.   MRI : Multiple small acute infarcts within the frontal lobes. New right thalamic microhemorrhage, since 01/18/12.  MRA : Normal intracranial MRA.  Carotid Doppler: pending  2D Echo: EF 50 - 55%. No cardiac source of emboli identified.   Sars Corona Virus 2 - negative  LDL 135  HgbA1c 6.1  VTE prophylaxis - none    Diet   Diet NPO time specified    No antithrombotic prior to admission, now on aspirin 81 mg daily and clopidogrel 75 mg daily.   Patient will be counseled to be compliant with his antithrombotic medications  Ongoing aggressive stroke risk factor management  Therapy recommendations:  pending  Disposition:  pending  Hypertension  Home BP meds: Zestril  Current BP meds: norepinephrine ; vasopressin  Stable  Long-term BP goal normotensive  Hyperlipidemia  Home Lipid lowering medication: Zocor 20 mg daily  LDL 135, goal < 70  Current lipid lowering medication: consider Lipitor 80 mg daily   Continue  statin at discharge   Other Active Problems, Findings, Recommendations and/or Plan  Code status -  Full  Intubated   NPO -> tube feedings  Hyperglycemia ->SSI    Anemia - Hgb - 9.0  Mild thrombocytopenia - platelets - 138  Aspiration pneumonia - Rocephin started 02/16/20   Hospital day # 4 Patient remains sedated and intubated due to respiratory and cardiac issues.  Repeat CT scan of the head is pending for later today and is stable enough to transport.  Recommend dual antiplatelet therapy for a year due to his cardiac revascularization and aggressive risk factor modification.   Discussed with patient's wife at the bedside and nurse and answered questions.  Discussed with patient, wife and Dr. Gala Romney.  Greater than 50% time during this 35-minute visit was spent on counseling and coordination of  care about a stroke and answering questions. Delia Heady, MD     To contact Stroke Continuity provider, please refer to [email or web address]. After hours, contact General Neurology

## 2020-02-19 NOTE — Progress Notes (Signed)
NAME:  Jeffrey Morse, MRN:  [id number], DOB:  16-May-1955, LOS: 4 ADMISSION DATE:  02/15/2020, CONSULTATION DATE:  02/15/20 REFERRING MD:  Junious Dresser  CHIEF COMPLAINT:  VF arrest   Brief History   Jeffrey Morse is a 65 y.o. male who was brought to [facility] ED 1/12 with VT arrest after he collapsed at [facility] (had gone there for chest pain).  He had multiple recurrent arrests before being taken to cath lab where he was found to have severe LV dysfunction with EF 25-35%, and 100% prox Cx stenosis.  He had DES and Impella placed and was then transferred to the ICU where PCCM was asked to assist with vent management.  History of present illness   Pt is encephelopathic; therefore, this HPI is obtained from chart review. Jeffrey Morse is a 65 y.o. male who has unknown PMH.  He was going to [facility] 1/12 for chest pain and before he got inside, he collapsed and went unresponsive.  Family was with him and began CPR while awaiting EMS.  On EMS arrival, he was found to have VT.  He was shocked once and had ROSC; however, he proceeded to have multiple recurrent arrests.  He was brought to [facility] and taken straight to cath lab where he was found to have severe LV dysfunction EF 25 - 35%, Ost LAD 40% stenosed, Dist LAD 90% stenosed, 1st Marg lesion 90% stenosed, Ost Cx 40% stenosed, Prox Cx 100% stenosed.   He had DES and Impella placed and was transferred to the ICU where PCCM was asked to assist with vent management.  He is currently on 33mcg/min levophed.  Past Medical History  Unknown. has Cardiogenic shock (HCC); Cardiac arrest [facility]); ST elevation myocardial infarction (STEMI) (HCC); Encounter for imaging study to confirm orogastric (OG) tube placement; Acute respiratory failure with hypoxia (HCC); Cerebral thrombosis with cerebral infarction; and Subarachnoid hemorrhage on their problem list.  Significant Hospital Events   1/12 > admit. 1/14 > brief (2 min) VT arrest 1/14 > not moving LUE, multiple small infarcts  found  Consults:  Cards, PCCM.  Procedures:  ETT 1/12 >  Impella 1/12 >  Significant Diagnostic Tests:  Cath lab 1/12 > Severe LV dysfunction EF 25 - 35%, Ost LAD 40% stenosed, Dist LAD 90% stenosed, 1st Marg lesion 90% stenosed, Ost Cx 40% stenosed, Prox Cx 100% stenosed.  DES placed, Impella placed. Echo 1/13 >  1. Left ventricular ejection fraction, by estimation, is 50 to 55%. The  left ventricle has mildly decreased function. The left ventricle  demonstrates regional wall motion abnormalities. Posterior wall  hypokinesis.  2. Impella measures about 3cm into LV cavity from aortic valve  3. Right ventricular systolic function is normal. The right ventricular  size is normal.   MRI Brain 1/14  IMPRESSION: 1. Multiple small acute infarcts within the frontal lobes. 2. New right thalamic microhemorrhage, since 01/18/12. 3. Normal intracranial MRA.  Micro Data:  Flu 1/12 > negative. COVID 1/12 > negative.  Antimicrobials:  Ceftriaxone 1/13>>  Interim history/subjective:  No events, ongoing lack of BM. Some vent de-synchrony resulting in increased sedation.  Objective:  Blood pressure 126/61, pulse 68, temperature 99.5 F (37.5 C), temperature source Axillary, resp. rate 18, height 5\' 10"  (1.778 m), weight 102.4 kg, SpO2 96 %. CVP:  [1 mmHg-10 mmHg] 8 mmHg  Vent Mode: PRVC FiO2 (%):  [40 %-50 %] 40 % Set Rate:  [8 bmp-18 bmp] 18 bmp Vt Set:  [560 mL] 560 mL  PEEP:  [6 cmH20] 6 cmH20 Plateau Pressure:  [15 cmH20-19 cmH20] 16 cmH20   Intake/Output Summary (Last 24 hours) at 02/19/2020 0914 Last data filed at 02/19/2020 0636 Gross per 24 hour  Intake 1598.88 ml  Output 1374 ml  Net 224.88 ml   Filed Weights   02/17/20 0615 02/18/20 0500 02/19/20 0420  Weight: 106.7 kg 104.2 kg 102.4 kg    Examination: Constitutional: chronically ill man in nAD  Eyes: attempts but unable to track with eyes, pupils equal Ears, nose, mouth, and throat: MMM, trachea midline, ETT  with thick purulent secretions Cardiovascular: RRR, ext warm Respiratory: Rhonci bilaterally, no accessory muscle use, triggering vent Gastrointestinal: soft, hypoactive BS Skin: No rashes, normal turgor Neurologic: moves R > L, withdraws with LUE Psychiatric: RASS -2   Sugars a bit low SvO2 66% Cr okay A bit oliguric CBC stable  Assessment & Plan:  Acute hypoxic resp failure post cardiac arrest- CXR now clear OOH Vtach cardiac arrest- suspect related to prox L circ occlusion post DES Cardiogenic shock after arrest- post impella, improving, CHF team following, impella now out and SvO2 down Likely CAP vs. Aspiration PNA- on ceftriaxone IH Vtach cardiac arrest- brief, 1/14,  Thought due to ischemic QT prolongation and hypokalemia Embolic strokes, thalamic with hemorrhagic conversion- small, hopefully will have good return of function Ileus vs. Gastroparesis- able to keep down PO now but still constipated Hypokalemia  - f/u trach aspirate, 7 days ceftriaxone in absence of other data - antiplatelets, GDT, AC, diuretics, antiarrythmics per CHF team and neurology discussion - close neuro checks, repeat CT head today, stroke team following - increased bowel regimen started - SSI - VAP prevention bundle - really want ileus and mental status better addressed prior to extubation  Best Practice (evaluated daily):  Diet: TF Pain/Anxiety/Delirium protocol (if indicated): weaning, trial of precedex instead of benzos VAP protocol (if indicated): In place. DVT prophylaxis: heparin gtt GI prophylaxis: PPI. Glucose control: Insulin ssi Mobility: Bedrest. Disposition: ICU.  Goals of Care:  Last date of multidisciplinary goals of care discussion:per primary Family and staff present: None. Summary of discussion: None. Follow up goals of care discussion due: 1/18. Code Status:  Full.   Patient critically ill due to respiratory failure, cardiogenic shock Interventions to address this  today ventilator and sedation titration Risk of deterioration without these interventions is high  I personally spent 36 minutes providing critical care not including any separately billable procedures  Myrla Halsted MD [facility] 02/19/2020 9:14 AM Personal pager: [phone] If unanswered, please page CCM On-call: #[phone]

## 2020-02-19 NOTE — Progress Notes (Signed)
Pharmacy Antibiotic Note  Jeffrey Morse is a 65 y.o. male admitted on 02/15/2020 with STEMI, cardiogenic shock and s/p impella,  new embolic CVAs, small ICH .  Patient has spiked fever 102, wbc 7.5, cr 1.   Pharmacy has been consulted for vancomycin and cefepime dosing. After Pan Cx Ucx, TA, Bcx Plan: Vancomycin 1500mg  IV x1 then 1250mg  IV q12h Cefepime 2gm IV q8h  Height: 5\' 10"  (177.8 cm) Weight: 102.4 kg (225 lb 12 oz) IBW/kg (Calculated) : 73  Temp (24hrs), Avg:99.8 F (37.7 C), Min:98.8 F (37.1 C), Max:102.4 F (39.1 C)  Recent Labs  Lab 02/15/20 1706 02/15/20 1923 02/15/20 2039 02/15/20 2133 02/16/20 0107 02/16/20 0323 02/16/20 0808 02/16/20 1618 02/17/20 0118 02/17/20 0449 02/18/20 0500 02/19/20 0427  WBC  --   --   --    < >  --  18.5*  --  11.3*  --  11.5* 8.1 7.5  CREATININE  --    < >  --    < >  --  1.42*  --  1.15 1.28* 1.08 0.99 0.91  LATICACIDVEN 6.1*  --  3.7*  --  2.9*  --  1.5 1.1  --   --   --   --    < > = values in this interval not displayed.    Estimated Creatinine Clearance: 99.7 mL/min (by C-G formula based on SCr of 0.91 mg/dL).    No Known Allergies  Antimicrobials this admission: Ceftriaxone 1/13>   Dose adjustments this admission:   Microbiology results: 1/13 MRSA pcr negative 1/14 TA normal flora    01-16-1981 Pharm.D. CPP, BCPS Clinical Pharmacist [phone] 02/19/2020 9:36 PM

## 2020-02-20 ENCOUNTER — Inpatient Hospital Stay (HOSPITAL_COMMUNITY): Payer: BC Managed Care – PPO

## 2020-02-20 DIAGNOSIS — M7989 Other specified soft tissue disorders: Secondary | ICD-10-CM

## 2020-02-20 DIAGNOSIS — I6330 Cerebral infarction due to thrombosis of unspecified cerebral artery: Secondary | ICD-10-CM | POA: Diagnosis not present

## 2020-02-20 LAB — COOXEMETRY PANEL
Carboxyhemoglobin: 1.3 % (ref 0.5–1.5)
Methemoglobin: 1.1 % (ref 0.0–1.5)
O2 Saturation: 67.7 %
Total hemoglobin: 7.8 g/dL — ABNORMAL LOW (ref 12.0–16.0)

## 2020-02-20 LAB — POCT I-STAT 7, (LYTES, BLD GAS, ICA,H+H)
Acid-Base Excess: 3 mmol/L — ABNORMAL HIGH (ref 0.0–2.0)
Bicarbonate: 27.3 mmol/L (ref 20.0–28.0)
Calcium, Ion: 1.17 mmol/L (ref 1.15–1.40)
HCT: 28 % — ABNORMAL LOW (ref 39.0–52.0)
Hemoglobin: 9.5 g/dL — ABNORMAL LOW (ref 13.0–17.0)
O2 Saturation: 99 %
Potassium: 3.2 mmol/L — ABNORMAL LOW (ref 3.5–5.1)
Sodium: 141 mmol/L (ref 135–145)
TCO2: 28 mmol/L (ref 22–32)
pCO2 arterial: 37.6 mmHg (ref 32.0–48.0)
pH, Arterial: 7.469 — ABNORMAL HIGH (ref 7.350–7.450)
pO2, Arterial: 120 mmHg — ABNORMAL HIGH (ref 83.0–108.0)

## 2020-02-20 LAB — PLATELET FUNCTION ASSAY
Collagen / Epinephrine: 124 seconds (ref 0–193)
Collagen / Epinephrine: 124 seconds (ref 0–193)

## 2020-02-20 LAB — MAGNESIUM: Magnesium: 2 mg/dL (ref 1.7–2.4)

## 2020-02-20 LAB — GLUCOSE, CAPILLARY
Glucose-Capillary: 120 mg/dL — ABNORMAL HIGH (ref 70–99)
Glucose-Capillary: 129 mg/dL — ABNORMAL HIGH (ref 70–99)
Glucose-Capillary: 121 mg/dL — ABNORMAL HIGH (ref 70–99)
Glucose-Capillary: 120 mg/dL — ABNORMAL HIGH (ref 70–99)
Glucose-Capillary: 128 mg/dL — ABNORMAL HIGH (ref 70–99)
Glucose-Capillary: 119 mg/dL — ABNORMAL HIGH (ref 70–99)

## 2020-02-20 LAB — CBC
HCT: 27.7 % — ABNORMAL LOW (ref 39.0–52.0)
Hemoglobin: 8.9 g/dL — ABNORMAL LOW (ref 13.0–17.0)
MCH: 28.8 pg (ref 26.0–34.0)
MCHC: 32.1 g/dL (ref 30.0–36.0)
MCV: 89.6 fL (ref 80.0–100.0)
Platelets: 172 10*3/uL (ref 150–400)
RBC: 3.09 MIL/uL — ABNORMAL LOW (ref 4.22–5.81)
RDW: 14.1 % (ref 11.5–15.5)
WBC: 6.9 10*3/uL (ref 4.0–10.5)
nRBC: 0 % (ref 0.0–0.2)

## 2020-02-20 LAB — PHOSPHORUS: Phosphorus: 3.8 mg/dL (ref 2.5–4.6)

## 2020-02-20 LAB — BASIC METABOLIC PANEL
Anion gap: 15 (ref 5–15)
Anion gap: 12 (ref 5–15)
BUN: 23 mg/dL (ref 8–23)
BUN: 21 mg/dL (ref 8–23)
CO2: 24 mmol/L (ref 22–32)
CO2: 26 mmol/L (ref 22–32)
Calcium: 8.5 mg/dL — ABNORMAL LOW (ref 8.9–10.3)
Calcium: 8.4 mg/dL — ABNORMAL LOW (ref 8.9–10.3)
Chloride: 102 mmol/L (ref 98–111)
Chloride: 103 mmol/L (ref 98–111)
Creatinine, Ser: 1.04 mg/dL (ref 0.61–1.24)
Creatinine, Ser: 0.9 mg/dL (ref 0.61–1.24)
GFR, Estimated: >60 mL/min (ref >60)
GFR, Estimated: >60 mL/min (ref >60)
Glucose, Bld: 128 mg/dL — ABNORMAL HIGH (ref 70–99)
Glucose, Bld: 129 mg/dL — ABNORMAL HIGH (ref 70–99)
Potassium: 3.2 mmol/L — ABNORMAL LOW (ref 3.5–5.1)
Potassium: 4 mmol/L (ref 3.5–5.1)
Sodium: 141 mmol/L (ref 135–145)
Sodium: 141 mmol/L (ref 135–145)

## 2020-02-20 MED ORDER — NEOSTIGMINE METHYLSULFATE 10 MG/10ML IV SOLN
0.2500 mg | Freq: Four times a day (QID) | INTRAVENOUS | Status: DC
  Filled 2020-02-20 (×3): qty 0.25

## 2020-02-20 MED ORDER — POTASSIUM CHLORIDE 10 MEQ/50ML IV SOLN
10.0000 meq | INTRAVENOUS | Status: AC
  Administered 2020-02-20 (×4): 10 meq via INTRAVENOUS
  Filled 2020-02-20 (×4): qty 50

## 2020-02-20 MED ORDER — NEOSTIGMINE METHYLSULFATE 10 MG/10ML IV SOLN
0.2500 mg | Freq: Four times a day (QID) | INTRAVENOUS | Status: DC
  Administered 2020-02-20 – 2020-02-22 (×8): 0.25 mg via SUBCUTANEOUS
  Filled 2020-02-20 (×11): qty 0.25

## 2020-02-20 MED ORDER — CLOPIDOGREL BISULFATE 75 MG PO TABS
75.0000 mg | ORAL_TABLET | Freq: Every day | ORAL | Status: DC
  Administered 2020-02-21 – 2020-02-22 (×2): 75 mg
  Filled 2020-02-20 (×2): qty 1

## 2020-02-20 MED ORDER — TIROFIBAN HCL IN NACL 5-0.9 MG/100ML-% IV SOLN
0.1000 ug/kg/min | INTRAVENOUS | Status: DC
  Administered 2020-02-20: 0.1 ug/kg/min via INTRAVENOUS
  Filled 2020-02-20: qty 100

## 2020-02-20 MED ORDER — METOCLOPRAMIDE HCL 5 MG/ML IJ SOLN
10.0000 mg | Freq: Every day | INTRAMUSCULAR | Status: DC
  Administered 2020-02-20 – 2020-02-22 (×3): 10 mg via INTRAVENOUS
  Filled 2020-02-20 (×3): qty 2

## 2020-02-20 MED ORDER — POTASSIUM CHLORIDE 10 MEQ/100ML IV SOLN
10.0000 meq | INTRAVENOUS | Status: DC
  Filled 2020-02-20: qty 100

## 2020-02-20 MED ORDER — BISACODYL 10 MG RE SUPP
10.0000 mg | Freq: Every day | RECTAL | Status: DC
  Administered 2020-02-20 – 2020-02-21 (×2): 10 mg via RECTAL
  Filled 2020-02-20 (×2): qty 1

## 2020-02-20 NOTE — Progress Notes (Signed)
Pt transported to and from CT without event

## 2020-02-20 NOTE — Plan of Care (Signed)
  Problem: Intracerebral Hemorrhage Tissue Perfusion: Goal: Complications of Intracerebral Hemorrhage will be minimized Outcome: Progressing   

## 2020-02-20 NOTE — Progress Notes (Addendum)
Advanced Heart Failure Rounding Note   Subjective:    1/12 admitted for Vfib arrest, cath PCI/DES and impella 1/14 VT arrest, ROSC after 3 minutes.  1/15 Impella removed 1/16 Left sided weakness. MRI with multiple small infarcts and small ICH  On amio 30, fentanyl 50, versed 2, precedex 0.6; FiO2 50%, CVP 9, co-ox 68%.  Overnight, tried to get him off versed due to not following commands but became more agitated.  Restarted versed.   Echo EF ~50-55% LV small RV ok.   Objective:   Weight Range:  Vital Signs:   Temp:  [98.8 F (37.1 C)-102.4 F (39.1 C)] 98.8 F (37.1 C) (01/17 0400) Pulse Rate:  [49-91] 56 (01/17 0600) Resp:  [10-32] 23 (01/17 0600) BP: (83-154)/(52-92) 116/67 (01/17 0400) SpO2:  [90 %-100 %] 96 % (01/17 0600) Arterial Line BP: (78-184)/(40-87) 123/59 (01/17 0600) FiO2 (%):  [40 %-50 %] 50 % (01/17 0213) Weight:  [99.7 kg] 99.7 kg (01/17 0540) Last BM Date:  (UTA)  Weight change: Filed Weights   02/18/20 0500 02/19/20 0420 02/20/20 0540  Weight: 104.2 kg 102.4 kg 99.7 kg    Intake/Output:   Intake/Output Summary (Last 24 hours) at 02/20/2020 0635 Last data filed at 02/20/2020 0344 Gross per 24 hour  Intake 2594.41 ml  Output 4990 ml  Net -2395.59 ml     Physical Exam: CVP 9 General:  Does not follow commands.  Grimaces to tactile stimulie.   HEENT: normal Neck: supple.  Cor: Regular rate & rhythm. No rubs, gallops or murmurs. Lungs: Diminished RLL.  Abdomen: soft, nontender, + distended.  Extremities: no cyanosis, clubbing, rash.  BUE, thigh edema.  Redness R bicep.  Neuro: intubated, not following commands.    Telemetry: Loletha Grayer, rates getting down 49.  Personally reviewed.    Labs: Basic Metabolic Panel: Recent Labs  Lab 02/15/20 1923 02/15/20 2133 02/16/20 1618 02/17/20 0116 02/17/20 0118 02/17/20 0449 02/17/20 1852 02/18/20 0409 02/18/20 0500 02/19/20 0427 02/20/20 0504 02/20/20 0508  NA 136   < > 137   < > 137 134*   --  138 137 137 141 141  K 4.2   < > 4.0   < > 3.3* 4.0  --  3.7 3.7 3.5 3.2* 3.2*  CL 108   < > 104  --  103 101  --   --  102 101 102  --   CO2 17*   < > 23  --  23 24  --   --  26 24 24   --   GLUCOSE 239*   < > 164*  --  239* 196*  --   --  116* 99 128*  --   BUN 21   < > 22  --  21 22  --   --  22 21 23   --   CREATININE 1.28*   < > 1.15  --  1.28* 1.08  --   --  0.99 0.91 1.04  --   CALCIUM 8.2*   < > 7.7*  --  7.8* 8.1*  --   --  8.2* 8.3* 8.5*  --   MG 2.0  --  1.7  --   --  2.2 1.8  --   --   --  2.0  --   PHOS  --   --  3.2  --   --  2.3* 2.1*  --   --   --  3.8  --    < > =  values in this interval not displayed.    Liver Function Tests: Recent Labs  Lab 02/15/20 1114 02/16/20 0323  AST 73* 175*  ALT 54* 53*  ALKPHOS 91 67  BILITOT 0.8 0.6  PROT 6.1* 5.2*  ALBUMIN 3.5 2.9*   No results for input(s): LIPASE, AMYLASE in the last 168 hours. No results for input(s): AMMONIA in the last 168 hours.  CBC: Recent Labs  Lab 02/15/20 1114 02/15/20 1140 02/16/20 1618 02/17/20 0116 02/17/20 0449 02/18/20 0409 02/18/20 0500 02/19/20 0427 02/20/20 0504 02/20/20 0508  WBC 14.2*   < > 11.3*  --  11.5*  --  8.1 7.5 6.9  --   NEUTROABS 7.8*  --   --   --   --   --   --   --   --   --   HGB 14.4   < > 11.2*   < > 10.1* 8.5* 9.0* 9.9* 8.9* 9.5*  HCT 45.7   < > 32.5*   < > 31.3* 25.0* 28.1* 30.7* 27.7* 28.0*  MCV 90.7   < > 86.7  --  88.7  --  90.6 91.4 89.6  --   PLT 280   < > 212  --  190  --  138* 154 172  --    < > = values in this interval not displayed.    Cardiac Enzymes: No results for input(s): CKTOTAL, CKMB, CKMBINDEX, TROPONINI in the last 168 hours.  BNP: BNP (last 3 results) No results for input(s): BNP in the last 8760 hours.  ProBNP (last 3 results) No results for input(s): PROBNP in the last 8760 hours.    Other results:  Imaging: DG Abd 1 View  Result Date: 02/19/2020 CLINICAL DATA:  Orogastric tube placement. EXAM: ABDOMEN - 1 VIEW COMPARISON:   None. FINDINGS: The bowel gas pattern is normal. Distal tip of enteric tube is seen in the stomach in grossly good position. No radio-opaque calculi or other significant radiographic abnormality are seen. IMPRESSION: Distal tip of enteric tube seen in the stomach in grossly good position. Electronically Signed   By: Marijo Conception M.D.   On: 02/19/2020 14:11   DG Abd 1 View  Result Date: 02/19/2020 CLINICAL DATA:  Orogastric tube placement. EXAM: ABDOMEN - 1 VIEW COMPARISON:  Same day. FINDINGS: The bowel gas pattern is normal. Distal tip of nasogastric tube appears to be in the proximal stomach. However, proximal side hole appears to be in the expected position of gastroesophageal junction. No radio-opaque calculi or other significant radiographic abnormality are seen. IMPRESSION: Distal tip of nasogastric tube appears to be in the proximal stomach. However, proximal side hole appears to be in the expected position of gastroesophageal junction. Advancement is recommended. Electronically Signed   By: Marijo Conception M.D.   On: 02/19/2020 13:38   DG Abd 1 View  Result Date: 02/19/2020 CLINICAL DATA:  Nasal/orogastric tube placement. EXAM: ABDOMEN - 1 VIEW COMPARISON:  None. FINDINGS: Nasal/orogastric tube has its tip within the stomach, side hole projecting in the expected location of the gastroesophageal junction. Normal bowel gas pattern. IMPRESSION: Nasogastric tube tip within the stomach, side hole the gastroesophageal junction. Electronically Signed   By: Lajean Manes M.D.   On: 02/19/2020 13:37   DG Abd 1 View  Result Date: 02/19/2020 CLINICAL DATA:  Feeding tube placement. EXAM: ABDOMEN - 1 VIEW COMPARISON:  None. FINDINGS: The bowel gas pattern is normal. Distal portion of nasogastric tube appears to be looped in  the distal esophagus, with the distal tip in the distal esophagus. No radio-opaque calculi or other significant radiographic abnormality are seen. IMPRESSION: Distal portion of  nasogastric tube appears to be looped in the distal esophagus, with the distal tip in the distal esophagus. Electronically Signed   By: Marijo Conception M.D.   On: 02/19/2020 10:53   DG Abd 1 View  Result Date: 02/19/2020 CLINICAL DATA:  65 year old male status post cardiac arrest, cardiogenic shock. Ileus. EXAM: ABDOMEN - 1 VIEW COMPARISON:  02/17/2020 FINDINGS: Portable AP supine view at 0938 hours. NG tube is folded back on itself, and the tip could be within the distal esophagus or at the gastroesophageal junction now. Recommend advancing and repeating the radiograph. Normal bowel gas pattern. Negative abdominal and pelvic visceral contours. Pelvic phleboliths. No acute osseous abnormality identified. IMPRESSION: 1. NG tube folded back on itself, and could be extending retrograde within the distal esophagus. Recommend advancing the tube several cm and repeating portable radiograph of the upper abdomen. 2. Normal bowel gas pattern. These results will be called to the ordering clinician or representative by the Radiologist Assistant, and communication documented in the PACS or [employer]. Electronically Signed   By: Genevie Ann M.D.   On: 02/19/2020 10:03   CT HEAD WO CONTRAST  Result Date: 02/20/2020 CLINICAL DATA:  Stroke follow-up EXAM: CT HEAD WITHOUT CONTRAST TECHNIQUE: Contiguous axial images were obtained from the base of the skull through the vertex without intravenous contrast. COMPARISON:  None. FINDINGS: Brain: Unchanged punctate focus of blood at the right thalamus. Areas of faint hypoattenuation in the frontal lobes correspond to known recent infarcts. No mass lesion or extra-axial collection. Vascular: No abnormal hyperdensity of the major intracranial arteries or dural venous sinuses. No intracranial atherosclerosis. Skull: The visualized skull base, calvarium and extracranial soft tissues are normal. Sinuses/Orbits: No fluid levels or advanced mucosal thickening of the visualized paranasal  sinuses. No mastoid or middle ear effusion. The orbits are normal. IMPRESSION: 1. Unchanged punctate focus of blood at the right thalamus. 2. Faint hypoattenuation in the frontal lobes correspond to known recent infarcts. Electronically Signed   By: Ulyses Jarred M.D.   On: 02/20/2020 02:07   DG CHEST PORT 1 VIEW  Result Date: 02/20/2020 CLINICAL DATA:  Intubation EXAM: PORTABLE CHEST 1 VIEW COMPARISON:  February 19, 2020 FINDINGS: The endotracheal tube terminates above the carina. The right-sided PICC line is well position. The enteric tube extends below the left hemidiaphragm. There is a dense retrocardiac airspace opacity which is worse when compared to prior study. There is no pneumothorax. There is vascular congestion with mild interstitial edema. There are small bilateral pleural effusions. There are few perihilar airspace opacities. IMPRESSION: 1. Lines and tubes as above. 2. Worsening retrocardiac airspace opacity favored to represent atelectasis. 3. Small bilateral pleural effusions. 4. Vascular congestion with mild interstitial edema. Electronically Signed   By: Constance Holster M.D.   On: 02/20/2020 05:43   DG Chest Port 1 View  Result Date: 02/19/2020 CLINICAL DATA:  65 year old male intubated. Cardiac arrest, cardiogenic shock. Multiple small acute cerebral infarcts. EXAM: PORTABLE CHEST 1 VIEW COMPARISON:  Portable chest 02/16/2020 and earlier. FINDINGS: Portable AP semi upright view at 0635 hours. Endotracheal tube tip in good position between the clavicles and carina. Enteric tube courses to the abdomen, tip not included. Right IJ Swan-Ganz catheter removed. New right PICC line in place, PICC line tip difficult to delineate. Lower lung volumes. Mild accentuation of cardiac size. Other mediastinal contours  are within normal limits. Increased interstitial markings in both lungs with perihilar crowding of markings, mild lung base hypo ventilation. No pneumothorax or pleural effusion. Paucity of  bowel gas in the visible upper abdomen. Stable visualized osseous structures. IMPRESSION: 1. Right IJ Swan-Ganz catheter removed. New right PICC line in place. Satisfactory ET tube and visible enteric tube. 2. Lower lung volumes with diffuse atelectasis suspected. Mild interstitial edema or viral/atypical respiratory infection difficult to exclude. Electronically Signed   By: Genevie Ann M.D.   On: 02/19/2020 08:27   VAS US CAROTID  Result Date: 02/19/2020 Carotid Arterial Duplex Study Indications:       CVA. Risk Factors:      Hypertension, hyperlipidemia, prior MI. Other Factors:     Post cardiac arrest. Limitations        Today's exam was limited due to a central line, bandages and                    patient on a ventilator. Comparison Study:  No prior study Performing Technologist: Sharion Dove RVS  Examination Guidelines: A complete evaluation includes B-mode imaging, spectral Doppler, color Doppler, and power Doppler as needed of all accessible portions of each vessel. Bilateral testing is considered an integral part of a complete examination. Limited examinations for reoccurring indications may be performed as noted.  Right Carotid Findings: +----------+--------+--------+--------+------------------+--------+           PSV cm/sEDV cm/sStenosisPlaque DescriptionComments +----------+--------+--------+--------+------------------+--------+ CCA Prox  81      21                                         +----------+--------+--------+--------+------------------+--------+ CCA Distal50      15                                         +----------+--------+--------+--------+------------------+--------+ ICA Prox  76      24                                         +----------+--------+--------+--------+------------------+--------+ ICA Distal60      24                                         +----------+--------+--------+--------+------------------+--------+ ECA       48      14                                          +----------+--------+--------+--------+------------------+--------+ +----------+--------+-------+--------+-------------------+           PSV cm/sEDV cmsDescribeArm Pressure (mmHG) +----------+--------+-------+--------+-------------------+ Subclavian28                                         +----------+--------+-------+--------+-------------------+  Left Carotid Findings: +----------+--------+--------+--------+------------------+--------+           PSV cm/sEDV cm/sStenosisPlaque DescriptionComments +----------+--------+--------+--------+------------------+--------+ CCA Prox  100     24                                         +----------+--------+--------+--------+------------------+--------+  CCA Distal97      28                                         +----------+--------+--------+--------+------------------+--------+ ICA Prox  80      28              focal and calcific         +----------+--------+--------+--------+------------------+--------+ ICA Distal46      19                                         +----------+--------+--------+--------+------------------+--------+ ECA       102     20                                         +----------+--------+--------+--------+------------------+--------+ +----------+--------+--------+--------+-------------------+           PSV cm/sEDV cm/sDescribeArm Pressure (mmHG) +----------+--------+--------+--------+-------------------+ [id number]                                          +----------+--------+--------+--------+-------------------+ +---------+--------+--+--------+--+ VertebralPSV cm/s50EDV cm/s22 +---------+--------+--+--------+--+   Summary: Right Carotid: The extracranial vessels were near-normal with only minimal wall                thickening or plaque. Left Carotid: The extracranial vessels were near-normal with only minimal wall               thickening or  plaque. Vertebrals:  Left vertebral artery demonstrates antegrade flow. Right vertebral              artery was not visualized. Subclavians: Normal flow hemodynamics were seen in bilateral subclavian              arteries. *See table(s) above for measurements and observations.  Electronically signed by Antony Contras MD on 02/19/2020 at 12:40:47 PM.    Final    [place] EKG SITE RITE  Result Date: 02/18/2020 If Site Rite image not attached, placement could not be confirmed due to current cardiac rhythm.    Medications:     Scheduled Medications: . aspirin  81 mg Per Tube Daily  . chlorhexidine gluconate (MEDLINE KIT)  15 mL Mouth Rinse BID  . Chlorhexidine Gluconate Cloth  6 each Topical Daily  . clopidogrel  75 mg Per Tube Daily  . feeding supplement (PROSource TF)  90 mL Per Tube TID  . fentaNYL (SUBLIMAZE) injection  50 mcg Intravenous Once  . furosemide  80 mg Intravenous BID  . insulin aspart  0-15 Units Subcutaneous Q4H  . mouth rinse  15 mL Mouth Rinse 10 times per day  . metoCLOPramide (REGLAN) injection  10 mg Intravenous Q12H  . pantoprazole (PROTONIX) IV  40 mg Intravenous Daily  . polyethylene glycol  17 g Per Tube BID  . potassium chloride  40 mEq Per Tube BID  . senna-docusate  1 tablet Per Tube BID  . sodium chloride flush  10-40 mL Intracatheter Q12H  . sodium chloride flush  3 mL Intravenous Q12H  . sorbitol  60 mL Per Tube Q0600  . spironolactone  12.5 mg Per Tube  Daily    Infusions: . sodium chloride    . amiodarone 30 mg/hr (02/20/20 0300)  . ceFEPime (MAXIPIME) IV 2 g (02/20/20 3748)  . dexmedetomidine (PRECEDEX) IV infusion 0.6 mcg/kg/hr (02/20/20 0300)  . feeding supplement (VITAL 1.5 CAL) Stopped (02/17/20 1700)  . fentaNYL infusion INTRAVENOUS 25 mcg/hr (02/20/20 0300)  . lactated ringers 20 mL/hr at 02/20/20 0300  . midazolam 2 mg/hr (02/20/20 0344)  . norepinephrine (LEVOPHED) Adult infusion 2.997 mcg/min (02/20/20 0300)  . vancomycin      PRN  Medications: sodium chloride, sodium chloride, acetaminophen **OR** acetaminophen (TYLENOL) oral liquid 160 mg/5 mL **OR** acetaminophen, fentaNYL, midazolam, ondansetron (ZOFRAN) IV, sodium chloride flush, sodium chloride flush   Assessment/Plan:   1. Cardiac (VTarrest in setting of acute inferolateral STEMI/VT - OOH cardiac arrest on 1/12 with prolonged CPR, resuscitated w/ CPR + defibrillation   - Emergent cath 1/12 w/ totally occluded mid LCx treated w/ PCI + DES - Recurrent VT on 1/14 -> defib x 1 - Continues on amio. (watch QT)  2. Acute systolic HF due in setting of acute MI -> cardiogenic shock - EF 25% in cath lab 1/12 - Echo 1/13 EF 50-55% - Impella pulled 1/14 - Co-ox 68%, CVP 9 - c/w diuretics   3. CAD with acute inferolateral  STEMI  - Emergent cath 1/12 w/ totally occluded mid LCx treated w/ PCI + DES - There is moderate ostial LAD disease and significant disease in the distal LAD close to the apex. No significant disease affecting the right coronary artery. - Continue DAPT. Brilinta switched to Plavix - No b-blocker yet with shock  4. Acute CVA with ICH - left-sided weakness --> now not following commands; repeat CT shows stable hemorrhage - MRI with multiple small infarcts and small ICH (Likely cardioembolic from arrest) - Neuro following - Brilinta switched to Plavix - Keep SBP 110-140 - PT/OT following  5. Acute hypoxic respiratory failure in setting of cardiac arrest - CXR atelectasis, CPT 8.54-->6 -> 3.7; FiO2 50% - Broadened abx d/t fever (f/u cultures - Continue to wean vent. Diurese gently.   6, Ileus - difficult situation. QT 486 with recent VT but needs to hold down Plavix (not candidate for cangrelor due to [place]).  -Concern he is not absorbing meds, high OGT output - Continue stool softeners and laxatives  7. AKI - due to ATN -> resolved  8. DM2 - HgbA1c 6.0, -SSI - Add Jardiance prior to d/c  9. Bradycardia -This evening progressive  bradycardia but HR does respond with agitation - monitor while on precedex  10.  Febrile, max 102.4 - urine, blood and respiratory cultures sent - WBC 6.9 - started on broad spectrum abx - Getting RUE u/s r/o thrombosis   Length of Stay: [place] NP 02/20/2020, 6:35 AM  Advanced Heart Failure Team Pager [phone] (M-F; 7a - 4p)  Please contact [facility] for night-coverage after hours (4p -7a ) and weekends on [email or web address]  Agree with above.   Remains intubated and sedated. On low dose NE. Spiked fever last night. Recultured.  Abx expanded to vanc/cefepime. Gets agitated with sedation wean. Moving right side but not left much.Repeat CT with no change in punctate thalmic bleed. Having very high residuals from NGT.  RUE u/s with cephalic clot (not DVT).   General:  Intubated/sedated HEENT: normal + ETT Neck: supple. Carotids 2+ bilat; no bruits. No lymphadenopathy or thryomegaly appreciated. Cor: PMI nondisplaced. Regular rate & rhythm. No rubs, gallops  or murmurs. Lungs: coarse Abdomen: soft, nontender, nondistended. No hepatosplenomegaly. No bruits or masses. Good bowel sounds. Extremities: no cyanosis, clubbing, rash, edema Neuro: sedated on vent   Remains on low-dose NE in setting of sedation. Co-ox 68% Unable to wean vent due to agitation and high NG residuals. Continue broad spectrum abx and IV diuresis today. Follow culture data.   Concern for inability to absorb Plavix with high NGT residuals. Will check P2Y12 level. If not therapeutic can switch to low-dose aggrastat until cor-trak placed. (D/w Neuro)  Aggressive treatment of ileus. KUB negative for obstruction. QT back up to 539. Need to back down on Reglan. Will stop amio for now.   CRITICAL CARE Performed by: Glori Bickers  Total critical care time: 45 minutes  Critical care time was exclusive of separately billable procedures and treating other patients.  Critical care was necessary to treat or  prevent imminent or life-threatening deterioration.  Critical care was time spent personally by me (independent of midlevel providers or residents) on the following activities: development of treatment plan with patient and/or surrogate as well as nursing, discussions with consultants, evaluation of patient's response to treatment, examination of patient, obtaining history from patient or surrogate, ordering and performing treatments and interventions, ordering and review of laboratory studies, ordering and review of radiographic studies, pulse oximetry and re-evaluation of patient's condition.  Glori Bickers, MD  9:56 AM

## 2020-02-20 NOTE — Progress Notes (Signed)
NAME:  Jeffrey Morse, MRN:  [id number], DOB:  1955/11/24, LOS: 5 ADMISSION DATE:  02/15/2020, CONSULTATION DATE:  02/15/20 REFERRING MD:  Junious Dresser  CHIEF COMPLAINT:  VF arrest   Brief History   Jeffrey Morse is a 65 y.o. male who was brought to [facility] ED 1/12 with VT arrest after he collapsed at [facility] (had gone there for chest pain).  He had multiple recurrent arrests before being taken to cath lab where he was found to have severe LV dysfunction with EF 25-35%, and 100% prox Cx stenosis.  He had DES and Impella placed and was then transferred to the ICU where PCCM was asked to assist with vent management.  History of present illness   Pt is encephelopathic; therefore, this HPI is obtained from chart review. Jeffrey Morse is a 65 y.o. male who has unknown PMH.  He was going to [facility] 1/12 for chest pain and before he got inside, he collapsed and went unresponsive.  Family was with him and began CPR while awaiting EMS.  On EMS arrival, he was found to have VT.  He was shocked once and had ROSC; however, he proceeded to have multiple recurrent arrests.  He was brought to [facility] and taken straight to cath lab where he was found to have severe LV dysfunction EF 25 - 35%, Ost LAD 40% stenosed, Dist LAD 90% stenosed, 1st Marg lesion 90% stenosed, Ost Cx 40% stenosed, Prox Cx 100% stenosed.   He had DES and Impella placed and was transferred to the ICU where PCCM was asked to assist with vent management.  He is currently on 18mcg/min levophed.  Past Medical History  Unknown. has Cardiogenic shock (HCC); Cardiac arrest [facility]); ST elevation myocardial infarction (STEMI) (HCC); Encounter for imaging study to confirm orogastric (OG) tube placement; Acute respiratory failure with hypoxia (HCC); Cerebral thrombosis with cerebral infarction; and Subarachnoid hemorrhage on their problem list.  Significant Hospital Events   1/12 > admit. 1/14 > brief (2 min) VT arrest 1/14 > not moving LUE, multiple small infarcts  found 1/16 > stable head CT, issues with ileus, spiked fever despite ceftriaxone- broadened to vanc/cefepime 1/17 > RUE swelling, duplex ordered, ongoing ileus  Consults:  Cards, PCCM, neuro  Procedures:  ETT 1/12 >  Impella 1/12 >  Significant Diagnostic Tests:  Cath lab 1/12 > Severe LV dysfunction EF 25 - 35%, Ost LAD 40% stenosed, Dist LAD 90% stenosed, 1st Marg lesion 90% stenosed, Ost Cx 40% stenosed, Prox Cx 100% stenosed.  DES placed, Impella placed. Echo 1/13 >  1. Left ventricular ejection fraction, by estimation, is 50 to 55%. The  left ventricle has mildly decreased function. The left ventricle  demonstrates regional wall motion abnormalities. Posterior wall  hypokinesis.  2. Impella measures about 3cm into LV cavity from aortic valve  3. Right ventricular systolic function is normal. The right ventricular  size is normal.   MRI Brain 1/14  IMPRESSION: 1. Multiple small acute infarcts within the frontal lobes. 2. New right thalamic microhemorrhage, since 01/18/12. 3. Normal intracranial MRA.  Micro Data:  Flu 1/12 > negative. COVID 1/12 > negative. See micro tab  Antimicrobials:  See fever tab  Interim history/subjective:  Still with ileus, high NGT output. Agitated with sedation weans.  Objective:  Blood pressure 116/67, pulse (!) 56, temperature 99.1 F (37.3 C), temperature source Oral, resp. rate (!) 23, height 5\' 10"  (1.778 m), weight 99.7 kg, SpO2 96 %. CVP:  [4 mmHg-15 mmHg] 9 mmHg  Vent Mode: PRVC FiO2 (%):  [40 %-50 %] 50 % Set Rate:  [18 bmp] 18 bmp Vt Set:  [560 mL] 560 mL PEEP:  [6 cmH20] 6 cmH20 Pressure Support:  [12 cmH20] 12 cmH20 Plateau Pressure:  [15 cmH20-17 cmH20] 17 cmH20   Intake/Output Summary (Last 24 hours) at 02/20/2020 0825 Last data filed at 02/20/2020 0700 Gross per 24 hour  Intake 3182.98 ml  Output 5210 ml  Net -2027.02 ml   Filed Weights   02/18/20 0500 02/19/20 0420 02/20/20 0540  Weight: 104.2 kg 102.4 kg  99.7 kg    Examination: Constitutional: ill appearing man on vent  Eyes: pupils pinpoint but equal Ears, nose, mouth, and throat: ETT in place, copious thick secretions Cardiovascular: RRR, ext warm Respiratory: scattered rhonci, triggering vent Gastrointestinal: soft, hypoactive BS, bilious fluid from NGT Skin: No rashes, normal turgor Neurologic: Moves R to command, flicker LLE, unable to get to move LUE, very agitated with sedation wean and dysynchronous so had to re-sedated Psychiatric: RASS +2 Ext: RUE swollen/red near PICC site  Sugars a bit low SvO2 67% Cr okay CBC stable  Assessment & Plan:  Acute hypoxic resp failure post cardiac arrest- CXR now clear OOH Vtach cardiac arrest- suspect related to prox L circ occlusion post DES Cardiogenic shock after arrest- post impella, improving, CHF team following, impella now out and SvO2 down, lingering mild pressor requirement likely from sedation HCAP vs. Aspiration PNA- febrile on ceftriaxone so broadened to vanc/cefepime 1/18 IH Vtach cardiac arrest- brief, 1/14,  Thought due to ischemic QT prolongation and hypokalemia Embolic strokes, thalamic with hemorrhagic conversion- small, hopefully will have good return of function; stable on imaging 1/16 Ileus vs. Gastroparesis- an issue again, need to figure out antiplatelet therapy Hypokalemia  RUE swollen- RUE duplex pending  - f/u trach aspirate, vanc/cefepime for now - antiplatelets, GDT, AC, antiarrythmics per CHF team and neurology discussion - replete K first then resume diuresis - check QT then increase reglan as able, restart neostigmine, standing suppositories - try to get postpyloric cortrak in - SSI - VAP prevention bundle - really want ileus, secretions, and mental status better addressed prior to extubation - updated family at length  Best Practice (evaluated daily):  Diet: NGT to LIS pending postpyloric cortrak placement Pain/Anxiety/Delirium protocol (if  indicated): trying to find happy medium, difficult at present given ileus VAP protocol (if indicated): In place. DVT prophylaxis:  GI prophylaxis: PPI. Glucose control: Insulin ssi Mobility: Bedrest. Disposition: ICU.  Goals of Care:  Per primary   Patient critically ill due to respiratory failure, cardiogenic shock, ileus Interventions to address this today ventilator and sedation titration Risk of deterioration without these interventions is high  I personally spent 42 minutes providing critical care not including any separately billable procedures  Myrla Halsted MD [facility] 02/20/2020 8:25 AM Personal pager: [phone] If unanswered, please page CCM On-call: #[phone]

## 2020-02-20 NOTE — Progress Notes (Deleted)
STROKE TEAM PROGRESS NOTE     INTERVAL HISTORY Still intubated and sedated. Repeat CT scan 02/20/20: Unchanged punctate focus of blood at the right thalamus. hypoattenuation in frontal lobes correspond to known recent infarcts.   Carotid ultrasound shows no significant extracranial stenosis on either side. Vitals:   02/20/20 0500 02/20/20 0540 02/20/20 0600 02/20/20 0742  BP:      Pulse: (!) 49  (!) 56   Resp: (!) 21  (!) 23   Temp:    99.1 F (37.3 C)  TempSrc:    Oral  SpO2: 100%  96%   Weight:  99.7 kg    Height:       CBC:  Recent Labs  Lab 02/15/20 1114 02/15/20 1140 02/19/20 0427 02/20/20 0504 02/20/20 0508  WBC 14.2*   < > 7.5 6.9  --   NEUTROABS 7.8*  --   --   --   --   HGB 14.4   < > 9.9* 8.9* 9.5*  HCT 45.7   < > 30.7* 27.7* 28.0*  MCV 90.7   < > 91.4 89.6  --   PLT 280   < > 154 172  --    < > = values in this interval not displayed.   Basic Metabolic Panel:  Recent Labs  Lab 02/17/20 1852 02/18/20 0409 02/19/20 0427 02/20/20 0504 02/20/20 0508  NA  --    < > 137 141 141  K  --    < > 3.5 3.2* 3.2*  CL  --    < > 101 102  --   CO2  --    < > 24 24  --   GLUCOSE  --    < > 99 128*  --   BUN  --    < > 21 23  --   CREATININE  --    < > 0.91 1.04  --   CALCIUM  --    < > 8.3* 8.5*  --   MG 1.8  --   --  2.0  --   PHOS 2.1*  --   --  3.8  --    < > = values in this interval not displayed.   Lipid Panel:  Recent Labs  Lab 02/18/20 1032  CHOL 133  TRIG 97  HDL 38*  CHOLHDL 3.5  VLDL 19  LDLCALC 76   HgbA1c:  Recent Labs  Lab 02/18/20 0500  HGBA1C 6.1*    IMAGING past 24 hours  CT Head Wo Contrast 02/17/2020 IMPRESSION:  1. Subcentimeter hyperdense focus within the right thalamic region, suspicious for a small acute bleed. MRI correlation is recommended.  2. Mild cerebral atrophy.  3. Moderate to marked severity paranasal sinus disease.  MR BRAIN WO CONTRAST MR ANGIO HEAD WO CONTRAST 02/17/2020 IMPRESSION:  1. Multiple small  acute infarcts within the frontal lobes.  2. New right thalamic microhemorrhage, since 01/18/12.  3. Normal intracranial MRA.   DG CHEST PORT 1 VIEW 02/16/2020 IMPRESSION:  Low lung volumes persist, with likely atelectasis at the lung bases and no overt edema or confluent airspace disease. Unchanged position of endotracheal tube, gastric tube, right IJ sheath/Swan-Ganz.   DG Abd Portable 1V 02/17/2020 IMPRESSION:  Enteric tube in satisfactory position, nonobstructive bowel gas pattern.   ECHOCARDIOGRAM LIMITED 02/16/2020 IMPRESSIONS   1. Left ventricular ejection fraction, by estimation, is 50 to 55%. The left ventricle has mildly decreased function. The left ventricle demonstrates regional wall motion abnormalities. Posterior wall hypokinesis.  2. Impella measures about 3cm into LV cavity from aortic valve   3. Right ventricular systolic function is normal. The right ventricular size is normal.   Bilateral Carotid Dopplers  02/18/2020 Bilateral mild thickening without hemodynamically significant stenosis.  Vertebral artery flow is antegrade on the left not visualized on the right  PHYSICAL EXAM Per Dr. Pearlean Brownie  Neurological:    ASSESSMENT/PLAN [name] is a 65 y.o. male with history of hypertension, hyperlipidemia and OSA presenting 02/15/20 with cardiac arrest with successful PCI and drug-eluting stent placement left circumflex complicated by hypotension (Impella device) with subsequent left sided weakness. He did not receive IV t-PA due to late presentation (>4.5 hours from time of onset).   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  As well as embolization from distal clot on the Impella device  CT head: Subcentimeter hyperdense focus within the right thalamic region, suspicious for a small acute bleed. MRI correlation is recommended. Mild cerebral atrophy.   MRI : Multiple small acute  infarcts within the frontal lobes. New right thalamic microhemorrhage, since 01/18/12.  MRA : Normal intracranial MRA.  Carotid Doppler: Right Carotid: The extracranial vessels were near-normal with only minimal  wall thickening or plaque.  Left Carotid: The extracranial vessels were near-normal with only minimal  Wall thickening or plaque.  Vertebrals: Left vertebral artery demonstrates antegrade flow. Right vertebral artery was not visualized.  Subclavians: Normal flow hemodynamics were seen in bilateral subclavian arteries.   2D Echo: EF 50 - 55%. No cardiac source of emboli identified.   Sars Corona Virus 2 - negative  LDL 135  HgbA1c 6.1  VTE prophylaxis - none    Diet   Diet NPO time specified    No antithrombotic prior to admission, now on aspirin 81 mg daily and clopidogrel 75 mg daily.   Patient will be counseled to be compliant with his antithrombotic medications  Ongoing aggressive stroke risk factor management  Therapy recommendations:  pending  Disposition:  pending  Hypertension  Home BP meds: Zestril  Current BP meds: norepinephrine ; vasopressin  Stable  Long-term BP goal normotensive  Hyperlipidemia  Home Lipid lowering medication: Zocor 20 mg daily  LDL 135, goal < 70  Current lipid lowering medication: consider Lipitor 80 mg daily   Continue statin at discharge   Other Active Problems, Findings, Recommendations and/or Plan  Code status -  Full  Intubated   NPO -> tube feedings  Hyperglycemia ->SSI    Anemia - Hgb - 9.0  Mild thrombocytopenia - platelets - 138  Aspiration pneumonia - Rocephin started 02/16/20   Hospital day # 5      To contact Stroke Continuity provider, please refer to [email or web address]. After hours, contact General Neurology

## 2020-02-20 NOTE — Progress Notes (Addendum)
[facility] for aggrastat Indication: chest pain/ACS  No Known Allergies  Patient Measurements: Height: 5' 10"  (177.8 cm) Weight: 99.7 kg (219 lb 12.8 oz) IBW/kg (Calculated) : 73  Vital Signs: Temp: 98.6 F (37 C) (01/17 1539) Temp Source: Oral (01/17 1539) BP: 122/78 (01/17 1600) Pulse Rate: 56 (01/17 1600)  Labs: Recent Labs    02/18/20 0500 02/19/20 0427 02/20/20 0504 02/20/20 0508 02/20/20 1400  HGB 9.0* 9.9* 8.9* 9.5*  --   HCT 28.1* 30.7* 27.7* 28.0*  --   PLT 138* 154 172  --   --   CREATININE 0.99 0.91 1.04  --  0.90    Estimated Creatinine Clearance: 99.5 mL/min (by C-G formula based on SCr of 0.9 mg/dL).   Medical History: Past Medical History:  Diagnosis Date   HTN (hypertension)    Hypercholesteremia    Sleep apnea     Medications:  Scheduled:   aspirin  81 mg Per Tube Daily   bisacodyl  10 mg Rectal Daily   chlorhexidine gluconate (MEDLINE KIT)  15 mL Mouth Rinse BID   Chlorhexidine Gluconate Cloth  6 each Topical Daily   clopidogrel  75 mg Per Tube Daily   feeding supplement (PROSource TF)  90 mL Per Tube TID   fentaNYL (SUBLIMAZE) injection  50 mcg Intravenous Once   furosemide  80 mg Intravenous BID   insulin aspart  0-15 Units Subcutaneous Q4H   mouth rinse  15 mL Mouth Rinse 10 times per day   metoCLOPramide (REGLAN) injection  10 mg Intravenous Daily   neostigmine  0.25 mg Subcutaneous Q6H   pantoprazole (PROTONIX) IV  40 mg Intravenous Daily   polyethylene glycol  17 g Per Tube BID   senna-docusate  1 tablet Per Tube BID   sodium chloride flush  10-40 mL Intracatheter Q12H   sodium chloride flush  3 mL Intravenous Q12H   sorbitol  60 mL Per Tube Q0600   spironolactone  12.5 mg Per Tube Daily    Assessment: 20 yom presenting VFib arrest; cath PCI/DES to mid LCx and impella (now removed) - now MRI found multiple small infarcts and small ICH.   Found to have ileus - having  high OGT output and concern with not absorbing medications - discussed with HF and neurology, okay to start to low dose aggrastat.   Will get P2Y12 inhibitor level prior to start (can interfere with result if collected after aggrastat start). Given ICH, will use lower dose aggrastat.  Goal of Therapy:  Monitor platelets by anticoagulation protocol: Yes   Plan:  Start aggrastat at 0.1 mcg/kg/min Monitor s/sx of bleeding, CBC F/u P2Y12 inhibitor level, restart plavix   Antonietta Jewel, PharmD, [place] Pharmacist  Phone: [phone] 02/20/2020 4:27 PM  Please check AMION for all [place] phone numbers After 10:00 PM, call [place] [phone]

## 2020-02-20 NOTE — Progress Notes (Signed)
SLP Cancellation Note  Patient Details Name: Jeffrey Morse MRN: [id number] DOB: December 31, 1955   Cancelled treatment:       Reason Eval/Treat Not Completed: Patient not medically ready. Chart reviewed and discussed with RN. Pt remains intubated, becoming agitated with attempts to wean sedation. MD note indicates that he would like for ileus, secretions, and mental status to improve prior to extubation. RN suggests that we sign off for now. Please reorder SLP when ready.    Mahala Menghini., M.A. CCC-SLP Acute Rehabilitation Services Pager [phone] Office [phone]  02/20/2020, 9:20 AM

## 2020-02-20 NOTE — Progress Notes (Signed)
Physical Therapy Discharge Patient Details Name: Jeffrey Morse MRN: [id number] DOB: 05-12-1955 Today's Date: 02/20/2020 Time:  -     Patient discharged from PT services secondary to medical decline - will need to re-order PT to resume therapy services.  Please see latest therapy progress note for current level of functioning and progress toward goals.    Progress and discharge plan discussed with patient and/or caregiver: Patient unable to participate in discharge planning and no caregivers available  GP     Berline Lopes 02/20/2020, 12:53 PM  Jeffrey Morse,PT Acute Rehabilitation Services Pager:  [phone]  Office:  [phone]

## 2020-02-20 NOTE — Progress Notes (Addendum)
STROKE TEAM PROGRESS NOTE     INTERVAL HISTORY Patent evaluated at bedside this morning with wife present in the room. He remains sedated and intubated for respiratory failure. Due to intermittent agitation requires fentanyl at times. Febrile, Blood pressure still labile. Repeat CT scan shows stable hemorrhage. Difficult to evaluate patient and do neurological exam due to non-cooperation and sedation. Discussed with Dr. Gala Romney about concern for inability to absorb Plavix with high NGT residuals. After checking P2Y12 levels if not therapeutic, can change to low-dose Aggrastat to keep stents open. Also, explained same to wife in the room and sister via telephone. Neurological exam unchanged from y`day   Vitals:   02/20/20 1400 02/20/20 1415 02/20/20 1430 02/20/20 1445  BP:      Pulse: (!) 56 (!) 53 (!) 54 (!) 53  Resp: (!) 21 (!) 21 (!) 22 (!) 21  Temp:      TempSrc:      SpO2: 99% 100% 98% 99%  Weight:      Height:       CBC:  Recent Labs  Lab 02/15/20 1114 02/15/20 1140 02/19/20 0427 02/20/20 0504 02/20/20 0508  WBC 14.2*   < > 7.5 6.9  --   NEUTROABS 7.8*  --   --   --   --   HGB 14.4   < > 9.9* 8.9* 9.5*  HCT 45.7   < > 30.7* 27.7* 28.0*  MCV 90.7   < > 91.4 89.6  --   PLT 280   < > 154 172  --    < > = values in this interval not displayed.   Basic Metabolic Panel:  Recent Labs  Lab 02/17/20 1852 02/18/20 0409 02/19/20 0427 02/20/20 0504 02/20/20 0508  NA  --    < > 137 141 141  K  --    < > 3.5 3.2* 3.2*  CL  --    < > 101 102  --   CO2  --    < > 24 24  --   GLUCOSE  --    < > 99 128*  --   BUN  --    < > 21 23  --   CREATININE  --    < > 0.91 1.04  --   CALCIUM  --    < > 8.3* 8.5*  --   MG 1.8  --   --  2.0  --   PHOS 2.1*  --   --  3.8  --    < > = values in this interval not displayed.   Lipid Panel:  Recent Labs  Lab 02/18/20 1032  CHOL 133  TRIG 97  HDL 38*  CHOLHDL 3.5  VLDL 19  LDLCALC 76   HgbA1c:  Recent Labs  Lab 02/18/20 0500   HGBA1C 6.1*    IMAGING past 24 hours  CT Head wo contrast 02/20/2019  IMPRESSION: 1. Unchanged punctate focus of blood at the right thalamus. 2. Faint hypoattenuation in the frontal lobes correspond to known recent infarcts.  CT Head Wo Contrast 02/17/2020 IMPRESSION:  1. Subcentimeter hyperdense focus within the right thalamic region, suspicious for a small acute bleed. MRI correlation is recommended.  2. Mild cerebral atrophy.  3. Moderate to marked severity paranasal sinus disease.  MR BRAIN WO CONTRAST MR ANGIO HEAD WO CONTRAST 02/17/2020 IMPRESSION:  1. Multiple small acute infarcts within the frontal lobes.  2. New right thalamic microhemorrhage, since 01/18/12.  3. Normal intracranial MRA.  DG CHEST PORT 1 VIEW 02/16/2020 IMPRESSION:  Low lung volumes persist, with likely atelectasis at the lung bases and no overt edema or confluent airspace disease. Unchanged position of endotracheal tube, gastric tube, right IJ sheath/Swan-Ganz.   DG Abd Portable 1V 02/17/2020 IMPRESSION:  Enteric tube in satisfactory position, nonobstructive bowel gas pattern.   ECHOCARDIOGRAM LIMITED 02/16/2020 IMPRESSIONS   1. Left ventricular ejection fraction, by estimation, is 50 to 55%. The left ventricle has mildly decreased function. The left ventricle demonstrates regional wall motion abnormalities. Posterior wall hypokinesis.   2. Impella measures about 3cm into LV cavity from aortic valve   3. Right ventricular systolic function is normal. The right ventricular size is normal.   Bilateral Carotid Dopplers  02/18/2020 Bilateral mild thickening without hemodynamically significant stenosis.  Vertebral artery flow is antegrade on the left not visualized on the right.  VAS [place] upper extremity Venous Duplex Right 02/20/2020  Summary:    Right:  No evidence of deep vein thrombosis in the upper extremity. Findings  consistent  with acute superficial vein thrombosis involving the  right cephalic vein.    Left:  No evidence of thrombosis in the subclavian.   PHYSICAL EXAM  General: Well-developed well-nourished male who is intubated HEENT: [place]/AT, endotracheal tube in place Cardiovascular: Rate and rhythm Pulmonary: Clear to auscultation bilaterally Abdomen: Soft, nondistended, nontender Extremities: No peripheral edema Neurological:  Mental Status: Intubated, sedated on fentanyl drip arouses minimally to sternal rub does not follow any commands. Cranial Nerves: II III,IV, VI: Does not blink to threat on either side., pupils equal, round, reactive to light and accommodation, slight skew deviation with right eye hypertropia.   V,VII: normal VIII: hearing normal  IX,X: Unable to perform XI: Unable to perform XII: Unable to perform  Motor: Flaccid left upper extremity to noxious stimulation, left lower extremity has minimal flexion. Right extremities able to lift  against gravity purposefully upon stimulation Sensory: Pinprick and light touch intact throughout, bilaterally Cerebellar: Unable to perform Gait: Deferred  ASSESSMENT/PLAN Jeffrey Morse is a 65 y.o. male with history of hypertension, hyperlipidemia and OSA presenting 02/15/20 with cardiac arrest with successful PCI and drug-eluting stent placement left circumflex complicated by hypotension (Impella device) with subsequent left sided weakness. He did not receive IV t-PA due to late presentation (>4.5 hours from time of onset).   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  As well as embolization from distal clot on the Impella device  CT head: Subcentimeter hyperdense focus within the right thalamic region, suspicious for a small acute bleed. MRI correlation is recommended. Mild cerebral atrophy.   MRI : Multiple small acute infarcts within the frontal lobes. New right thalamic microhemorrhage, since  01/18/12.  MRA : Normal intracranial MRA.  Carotid Doppler: Bilateral mild thickening without hemodynamically significant stenosis.  Vertebral artery flow is antegrade on the left not visualized on the right.  2D Echo: EF 50 - 55%. No cardiac source of emboli identified.   VAS [place] upper extremity: Acute superficial vein thrombosis involving the right cephalic vein.  Sars Corona Virus 2 - negative  LDL 135  HgbA1c 6.1  VTE prophylaxis - none    Diet   Diet NPO time specified    No antithrombotic prior to admission, now on aspirin 81 mg daily and clopidogrel 75 mg daily.   Patient will be counseled to be compliant with his antithrombotic medications  Ongoing aggressive stroke risk factor management  Therapy recommendations:  pending  Disposition:  pending  Hypertension  Home BP meds: Zestril  Current BP meds: norepinephrine ; vasopressin  Stable  Long-term BP goal normotensive  Hyperlipidemia  Home Lipid lowering medication: Zocor 20 mg daily  LDL 135, goal < 70  Current lipid lowering medication: consider Lipitor 80 mg daily   Continue statin at discharge   Other Active Problems, Findings, Recommendations and/or Plan  Code status -  Full  Intubated   NPO -> tube feedings  Hyperglycemia ->SSI    Anemia - Hgb - 9.0  Mild thrombocytopenia - platelets - 138  HCAP vs Aspiration pneumonia - Rocephin started 02/16/20, Febrile on Ceftriaxone (Rocephin) , so changed to Vancomycin and Cefepime on 1/18.    Hospital day # 5 Patient remains sedated and intubated due to respiratory and cardiac issues.  Repeat CT scan of the head yesterday showed stable appearance of the right thalamic hemorrhagic infarct.  The patient unfortunately has developed ileus and may not be able to observe aspirin and Plavix given his fragile cardiac situation with a fresh stent may need IV Aggrastat to keep it open which may carry increased risk of hemorrhage but risk-benefit would  argue towards doing this despite this carrying a slightly high risk of hemorrhagic transformation..     Discussed with patient's wife at the bedside and sister over the phone and answered questions.  Discussed with  Dr. Gala Romney.  Greater than 50% time during this 32-minute visit was spent on counseling and coordination of care about a stroke and answering questions.  Delia Heady, MD     To contact Stroke Continuity provider, please refer to [email or web address]. After hours, contact General Neurology

## 2020-02-20 NOTE — Progress Notes (Signed)
VASCULAR LAB    Right upper extremity venous duplex has been performed.  See CV proc for preliminary results.  Gave verbal report to Dr. Gala Romney.  Raimi Guillermo, RVT 02/20/2020, 10:42 AM

## 2020-02-20 NOTE — Progress Notes (Signed)
PT Cancellation Note  Patient Details Name: Jeffrey Morse MRN: [id number] DOB: May 10, 1955   Cancelled Treatment:    Reason Eval/Treat Not Completed: Medical issues which prohibited therapy (Pt sedated and intubated and nurse asked PT to sign off.  Please reorder when pt becomes appropriate.)   Berline Lopes 02/20/2020, 12:53 PM Shagun Wordell W,PT Acute Rehabilitation Services Pager:  [phone]  Office:  [phone]

## 2020-02-21 ENCOUNTER — Inpatient Hospital Stay (HOSPITAL_COMMUNITY): Payer: BC Managed Care – PPO

## 2020-02-21 ENCOUNTER — Encounter: Payer: Self-pay | Admitting: Internal Medicine

## 2020-02-21 DIAGNOSIS — M7989 Other specified soft tissue disorders: Secondary | ICD-10-CM

## 2020-02-21 DIAGNOSIS — R609 Edema, unspecified: Secondary | ICD-10-CM

## 2020-02-21 DIAGNOSIS — I469 Cardiac arrest, cause unspecified: Secondary | ICD-10-CM | POA: Diagnosis not present

## 2020-02-21 DIAGNOSIS — J9601 Acute respiratory failure with hypoxia: Secondary | ICD-10-CM

## 2020-02-21 DIAGNOSIS — R570 Cardiogenic shock: Secondary | ICD-10-CM | POA: Diagnosis not present

## 2020-02-21 LAB — BASIC METABOLIC PANEL
Anion gap: 15 (ref 5–15)
Anion gap: 15 (ref 5–15)
Anion gap: 13 (ref 5–15)
BUN: 22 mg/dL (ref 8–23)
BUN: 23 mg/dL (ref 8–23)
BUN: 26 mg/dL — ABNORMAL HIGH (ref 8–23)
CO2: 24 mmol/L (ref 22–32)
CO2: 23 mmol/L (ref 22–32)
CO2: 23 mmol/L (ref 22–32)
Calcium: 8.4 mg/dL — ABNORMAL LOW (ref 8.9–10.3)
Calcium: 8.4 mg/dL — ABNORMAL LOW (ref 8.9–10.3)
Calcium: 8.6 mg/dL — ABNORMAL LOW (ref 8.9–10.3)
Chloride: 102 mmol/L (ref 98–111)
Chloride: 103 mmol/L (ref 98–111)
Chloride: 107 mmol/L (ref 98–111)
Creatinine, Ser: 1.02 mg/dL (ref 0.61–1.24)
Creatinine, Ser: 1.09 mg/dL (ref 0.61–1.24)
Creatinine, Ser: 1 mg/dL (ref 0.61–1.24)
GFR, Estimated: >60 mL/min (ref >60)
GFR, Estimated: >60 mL/min (ref >60)
GFR, Estimated: >60 mL/min (ref >60)
Glucose, Bld: 124 mg/dL — ABNORMAL HIGH (ref 70–99)
Glucose, Bld: 125 mg/dL — ABNORMAL HIGH (ref 70–99)
Glucose, Bld: 185 mg/dL — ABNORMAL HIGH (ref 70–99)
Potassium: 3 mmol/L — ABNORMAL LOW (ref 3.5–5.1)
Potassium: 3.5 mmol/L (ref 3.5–5.1)
Potassium: 3.6 mmol/L (ref 3.5–5.1)
Sodium: 141 mmol/L (ref 135–145)
Sodium: 141 mmol/L (ref 135–145)
Sodium: 143 mmol/L (ref 135–145)

## 2020-02-21 LAB — POCT I-STAT 7, (LYTES, BLD GAS, ICA,H+H)
Acid-Base Excess: 4 mmol/L — ABNORMAL HIGH (ref 0.0–2.0)
Acid-Base Excess: 2 mmol/L (ref 0.0–2.0)
Bicarbonate: 27.4 mmol/L (ref 20.0–28.0)
Bicarbonate: 23.8 mmol/L (ref 20.0–28.0)
Calcium, Ion: 1.15 mmol/L (ref 1.15–1.40)
Calcium, Ion: 1.11 mmol/L — ABNORMAL LOW (ref 1.15–1.40)
HCT: 29 % — ABNORMAL LOW (ref 39.0–52.0)
HCT: 31 % — ABNORMAL LOW (ref 39.0–52.0)
Hemoglobin: 9.9 g/dL — ABNORMAL LOW (ref 13.0–17.0)
Hemoglobin: 10.5 g/dL — ABNORMAL LOW (ref 13.0–17.0)
O2 Saturation: 95 %
O2 Saturation: 95 %
Patient temperature: 102.8
Potassium: 3.1 mmol/L — ABNORMAL LOW (ref 3.5–5.1)
Potassium: 2.9 mmol/L — ABNORMAL LOW (ref 3.5–5.1)
Sodium: 142 mmol/L (ref 135–145)
Sodium: 143 mmol/L (ref 135–145)
TCO2: 28 mmol/L (ref 22–32)
TCO2: 25 mmol/L (ref 22–32)
pCO2 arterial: 35.1 mmHg (ref 32.0–48.0)
pCO2 arterial: 29.5 mmHg — ABNORMAL LOW (ref 32.0–48.0)
pH, Arterial: 7.5 — ABNORMAL HIGH (ref 7.350–7.450)
pH, Arterial: 7.522 — ABNORMAL HIGH (ref 7.350–7.450)
pO2, Arterial: 66 mmHg — ABNORMAL LOW (ref 83.0–108.0)
pO2, Arterial: 73 mmHg — ABNORMAL LOW (ref 83.0–108.0)

## 2020-02-21 LAB — CBC
HCT: 30.4 % — ABNORMAL LOW (ref 39.0–52.0)
Hemoglobin: 9.9 g/dL — ABNORMAL LOW (ref 13.0–17.0)
MCH: 28.7 pg (ref 26.0–34.0)
MCHC: 32.6 g/dL (ref 30.0–36.0)
MCV: 88.1 fL (ref 80.0–100.0)
Platelets: 223 10*3/uL (ref 150–400)
RBC: 3.45 MIL/uL — ABNORMAL LOW (ref 4.22–5.81)
RDW: 14 % (ref 11.5–15.5)
WBC: 8.3 10*3/uL (ref 4.0–10.5)
nRBC: 0 % (ref 0.0–0.2)

## 2020-02-21 LAB — COOXEMETRY PANEL
Carboxyhemoglobin: 1.3 % (ref 0.5–1.5)
Methemoglobin: 0.9 % (ref 0.0–1.5)
O2 Saturation: 67.1 %
Total hemoglobin: 11.3 g/dL — ABNORMAL LOW (ref 12.0–16.0)

## 2020-02-21 LAB — MAGNESIUM: Magnesium: 1.9 mg/dL (ref 1.7–2.4)

## 2020-02-21 LAB — PHOSPHORUS: Phosphorus: 3.8 mg/dL (ref 2.5–4.6)

## 2020-02-21 LAB — GLUCOSE, CAPILLARY
Glucose-Capillary: 118 mg/dL — ABNORMAL HIGH (ref 70–99)
Glucose-Capillary: 136 mg/dL — ABNORMAL HIGH (ref 70–99)
Glucose-Capillary: 123 mg/dL — ABNORMAL HIGH (ref 70–99)
Glucose-Capillary: 138 mg/dL — ABNORMAL HIGH (ref 70–99)
Glucose-Capillary: 136 mg/dL — ABNORMAL HIGH (ref 70–99)

## 2020-02-21 LAB — SARS CORONAVIRUS 2 (TAT 6-24 HRS): SARS Coronavirus 2: NEGATIVE

## 2020-02-21 LAB — CULTURE, RESPIRATORY W GRAM STAIN: Culture: NORMAL

## 2020-02-21 LAB — URINE CULTURE: Culture: NO GROWTH

## 2020-02-21 MED ORDER — NYSTATIN 100000 UNIT/ML MT SUSP
5.0000 mL | Freq: Four times a day (QID) | OROMUCOSAL | Status: DC
  Administered 2020-02-21 – 2020-02-28 (×27): 500000 [IU] via OROMUCOSAL
  Filled 2020-02-21 (×26): qty 5

## 2020-02-21 MED ORDER — SACUBITRIL-VALSARTAN 24-26 MG PO TABS
1.0000 | ORAL_TABLET | Freq: Two times a day (BID) | ORAL | Status: DC
  Filled 2020-02-21: qty 1

## 2020-02-21 MED ORDER — POTASSIUM CHLORIDE 10 MEQ/50ML IV SOLN
10.0000 meq | INTRAVENOUS | Status: AC
  Administered 2020-02-21 (×2): 10 meq via INTRAVENOUS
  Filled 2020-02-21 (×2): qty 50

## 2020-02-21 MED ORDER — POTASSIUM CHLORIDE 10 MEQ/50ML IV SOLN
10.0000 meq | INTRAVENOUS | Status: AC
  Administered 2020-02-21 (×4): 10 meq via INTRAVENOUS
  Filled 2020-02-21 (×4): qty 50

## 2020-02-21 MED ORDER — PROSOURCE TF PO LIQD
90.0000 mL | Freq: Two times a day (BID) | ORAL | Status: DC
  Administered 2020-02-21 – 2020-02-22 (×2): 90 mL
  Filled 2020-02-21 (×2): qty 90

## 2020-02-21 MED ORDER — VITAL 1.5 CAL PO LIQD
1000.0000 mL | ORAL | Status: DC
  Administered 2020-02-21: 1000 mL

## 2020-02-21 MED ORDER — CHLORHEXIDINE GLUCONATE 0.12 % MT SOLN
OROMUCOSAL | Status: AC
  Administered 2020-02-21: 15 mL via OROMUCOSAL
  Filled 2020-02-21: qty 15

## 2020-02-21 MED ORDER — MAGNESIUM SULFATE IN D5W 1-5 GM/100ML-% IV SOLN
1.0000 g | Freq: Once | INTRAVENOUS | Status: AC
  Administered 2020-02-21: 1 g via INTRAVENOUS
  Filled 2020-02-21: qty 100

## 2020-02-21 MED ORDER — CARVEDILOL 6.25 MG PO TABS: 6.2500 mg | ORAL_TABLET | Freq: Two times a day (BID) | ORAL | Status: DC

## 2020-02-21 MED ORDER — FUROSEMIDE 10 MG/ML IJ SOLN
80.0000 mg | Freq: Once | INTRAMUSCULAR | Status: AC
  Administered 2020-02-21: 80 mg via INTRAVENOUS

## 2020-02-21 MED ORDER — NICARDIPINE HCL IN NACL 20-0.86 MG/200ML-% IV SOLN
3.0000 mg/h | INTRAVENOUS | Status: DC
  Filled 2020-02-21 (×2): qty 200

## 2020-02-21 MED ORDER — HYDRALAZINE HCL 20 MG/ML IJ SOLN
5.0000 mg | Freq: Once | INTRAMUSCULAR | Status: AC
  Administered 2020-02-21: 5 mg via INTRAVENOUS
  Filled 2020-02-21: qty 1

## 2020-02-21 MED FILL — Medication: Qty: 1 | Status: AC

## 2020-02-21 NOTE — Procedures (Signed)
Extubation Procedure Note  Patient Details:   Name: Jeffrey Morse DOB: 03/01/1955 MRN: [id number]   Airway Documentation:    Vent end date: 02/21/20 Vent end time: 0923   Evaluation  O2 sats: stable throughout Complications: No apparent complications Patient did tolerate procedure well. Bilateral Breath Sounds: Rhonchi   Pt extubated to 4L [place] per MD order. Pt had positive cuff leak and no stridor noted.  Guss Bunde 02/21/2020, 9:24 AM

## 2020-02-21 NOTE — Plan of Care (Signed)
  Problem: Education: Goal: Knowledge of disease or condition will improve Outcome: Progressing   Problem: Nutrition: Goal: Dietary intake will improve Outcome: Progressing   

## 2020-02-21 NOTE — Progress Notes (Addendum)
Advanced Heart Failure Rounding Note   Subjective:    1/12 admitted for Vfib arrest, cath PCI/DES and impella 1/14 VT arrest, ROSC after 3 minutes.  1/15 Impella removed 1/16 Left sided weakness. MRI with multiple small infarcts and small ICH  On fentanyl 100, versed 2, precedex 1.1; FiO2 50%, CVP 4, co-ox 67%.  Does not follow commands, grimaces to tactile stimuli.  Remains febrile  Echo EF ~50-55% LV small RV ok.   Objective:   Weight Range:  Vital Signs:   Temp:  [98.6 F (37 C)-100.2 F (37.9 C)] 100 F (37.8 C) (01/17 2300) Pulse Rate:  [51-77] 61 (01/18 0445) Resp:  [17-30] 21 (01/18 0445) BP: (112-156)/(61-86) 126/86 (01/18 0000) SpO2:  [93 %-100 %] 96 % (01/18 0445) Arterial Line BP: (116-173)/(47-80) 125/47 (01/18 0330) FiO2 (%):  [40 %-50 %] 40 % (01/18 0445) Weight:  [99.7 kg] 99.7 kg (01/17 0540) Last BM Date:  (PTA)  Weight change: Filed Weights   02/18/20 0500 02/19/20 0420 02/20/20 0540  Weight: 104.2 kg 102.4 kg 99.7 kg    Intake/Output:   Intake/Output Summary (Last 24 hours) at 02/21/2020 0508 Last data filed at 02/21/2020 0300 Gross per 24 hour  Intake 2603.32 ml  Output 5895 ml  Net -3291.68 ml     Physical Exam: CVP 4 General:  Intubated and chronically ill.   Neck: supple.  Cor: Regular rate & rhythm. No rubs, gallops or murmurs. Lungs: Coarse BS.  Abdomen: soft, nontender, + distended. Hypoactive BS.  Foley present.  Extremities: no cyanosis, clubbing, rash. BLE + edema Neuro:intubated, sedated, does not follow commands.   Telemetry: SR to SB with rates 50s. Personally reviewed.    Labs: Basic Metabolic Panel: Recent Labs  Lab 02/15/20 1923 02/15/20 2133 02/16/20 1618 02/17/20 0116 02/17/20 0449 02/17/20 1852 02/18/20 0409 02/18/20 0500 02/19/20 0427 02/20/20 0504 02/20/20 0508 02/20/20 1400  NA 136   < > 137   < > 134*  --    < > 137 137 141 141 141  K 4.2   < > 4.0   < > 4.0  --    < > 3.7 3.5 3.2* 3.2* 4.0   CL 108   < > 104   < > 101  --   --  102 101 102  --  103  CO2 17*   < > 23   < > 24  --   --  _0 --  26  GLUCOSE 239*   < > 164*   < > 196*  --   --  116* 99 128*  --  129*  BUN 21   < > 22   < > 22  --   --  _1 --  21  CREATININE 1.28*   < > 1.15   < > 1.08  --   --  0.99 0.91 1.04  --  0.90  CALCIUM 8.2*   < > 7.7*   < > 8.1*  --   --  8.2* 8.3* 8.5*  --  8.4*  MG 2.0  --  1.7  --  2.2 1.8  --   --   --  2.0  --   --   PHOS  --   --  3.2  --  2.3* 2.1*  --   --   --  3.8  --   --    < > = values in this interval not displayed.  Liver Function Tests: Recent Labs  Lab 02/15/20 1114 02/16/20 0323  AST 73* 175*  ALT 54* 53*  ALKPHOS 91 67  BILITOT 0.8 0.6  PROT 6.1* 5.2*  ALBUMIN 3.5 2.9*   No results for input(s): LIPASE, AMYLASE in the last 168 hours. No results for input(s): AMMONIA in the last 168 hours.  CBC: Recent Labs  Lab 02/15/20 1114 02/15/20 1140 02/16/20 1618 02/17/20 0116 02/17/20 0449 02/18/20 0409 02/18/20 0500 02/19/20 0427 02/20/20 0504 02/20/20 0508  WBC 14.2*   < > 11.3*  --  11.5*  --  8.1 7.5 6.9  --   NEUTROABS 7.8*  --   --   --   --   --   --   --   --   --   HGB 14.4   < > 11.2*   < > 10.1* 8.5* 9.0* 9.9* 8.9* 9.5*  HCT 45.7   < > 32.5*   < > 31.3* 25.0* 28.1* 30.7* 27.7* 28.0*  MCV 90.7   < > 86.7  --  88.7  --  90.6 91.4 89.6  --   PLT 280   < > 212  --  190  --  138* 154 172  --    < > = values in this interval not displayed.    Cardiac Enzymes: No results for input(s): CKTOTAL, CKMB, CKMBINDEX, TROPONINI in the last 168 hours.  BNP: BNP (last 3 results) No results for input(s): BNP in the last 8760 hours.  ProBNP (last 3 results) No results for input(s): PROBNP in the last 8760 hours.    Other results:  Imaging: DG Abd 1 View  Result Date: 02/19/2020 CLINICAL DATA:  Orogastric tube placement. EXAM: ABDOMEN - 1 VIEW COMPARISON:  None. FINDINGS: The bowel gas pattern is normal. Distal tip of enteric tube is  seen in the stomach in grossly good position. No radio-opaque calculi or other significant radiographic abnormality are seen. IMPRESSION: Distal tip of enteric tube seen in the stomach in grossly good position. Electronically Signed   By: Marijo Conception M.D.   On: 02/19/2020 14:11   DG Abd 1 View  Result Date: 02/19/2020 CLINICAL DATA:  Orogastric tube placement. EXAM: ABDOMEN - 1 VIEW COMPARISON:  Same day. FINDINGS: The bowel gas pattern is normal. Distal tip of nasogastric tube appears to be in the proximal stomach. However, proximal side hole appears to be in the expected position of gastroesophageal junction. No radio-opaque calculi or other significant radiographic abnormality are seen. IMPRESSION: Distal tip of nasogastric tube appears to be in the proximal stomach. However, proximal side hole appears to be in the expected position of gastroesophageal junction. Advancement is recommended. Electronically Signed   By: Marijo Conception M.D.   On: 02/19/2020 13:38   DG Abd 1 View  Result Date: 02/19/2020 CLINICAL DATA:  Nasal/orogastric tube placement. EXAM: ABDOMEN - 1 VIEW COMPARISON:  None. FINDINGS: Nasal/orogastric tube has its tip within the stomach, side hole projecting in the expected location of the gastroesophageal junction. Normal bowel gas pattern. IMPRESSION: Nasogastric tube tip within the stomach, side hole the gastroesophageal junction. Electronically Signed   By: Lajean Manes M.D.   On: 02/19/2020 13:37   DG Abd 1 View  Result Date: 02/19/2020 CLINICAL DATA:  Feeding tube placement. EXAM: ABDOMEN - 1 VIEW COMPARISON:  None. FINDINGS: The bowel gas pattern is normal. Distal portion of nasogastric tube appears to be looped in the distal esophagus, with the distal tip in the  distal esophagus. No radio-opaque calculi or other significant radiographic abnormality are seen. IMPRESSION: Distal portion of nasogastric tube appears to be looped in the distal esophagus, with the distal tip in  the distal esophagus. Electronically Signed   By: Marijo Conception M.D.   On: 02/19/2020 10:53   DG Abd 1 View  Result Date: 02/19/2020 CLINICAL DATA:  65 year old male status post cardiac arrest, cardiogenic shock. Ileus. EXAM: ABDOMEN - 1 VIEW COMPARISON:  02/17/2020 FINDINGS: Portable AP supine view at 0938 hours. NG tube is folded back on itself, and the tip could be within the distal esophagus or at the gastroesophageal junction now. Recommend advancing and repeating the radiograph. Normal bowel gas pattern. Negative abdominal and pelvic visceral contours. Pelvic phleboliths. No acute osseous abnormality identified. IMPRESSION: 1. NG tube folded back on itself, and could be extending retrograde within the distal esophagus. Recommend advancing the tube several cm and repeating portable radiograph of the upper abdomen. 2. Normal bowel gas pattern. These results will be called to the ordering clinician or representative by the Radiologist Assistant, and communication documented in the PACS or [employer]. Electronically Signed   By: Genevie Ann M.D.   On: 02/19/2020 10:03   CT HEAD WO CONTRAST  Result Date: 02/20/2020 CLINICAL DATA:  Stroke follow-up EXAM: CT HEAD WITHOUT CONTRAST TECHNIQUE: Contiguous axial images were obtained from the base of the skull through the vertex without intravenous contrast. COMPARISON:  None. FINDINGS: Brain: Unchanged punctate focus of blood at the right thalamus. Areas of faint hypoattenuation in the frontal lobes correspond to known recent infarcts. No mass lesion or extra-axial collection. Vascular: No abnormal hyperdensity of the major intracranial arteries or dural venous sinuses. No intracranial atherosclerosis. Skull: The visualized skull base, calvarium and extracranial soft tissues are normal. Sinuses/Orbits: No fluid levels or advanced mucosal thickening of the visualized paranasal sinuses. No mastoid or middle ear effusion. The orbits are normal. IMPRESSION: 1.  Unchanged punctate focus of blood at the right thalamus. 2. Faint hypoattenuation in the frontal lobes correspond to known recent infarcts. Electronically Signed   By: Ulyses Jarred M.D.   On: 02/20/2020 02:07   DG CHEST PORT 1 VIEW  Result Date: 02/20/2020 CLINICAL DATA:  Intubation EXAM: PORTABLE CHEST 1 VIEW COMPARISON:  February 19, 2020 FINDINGS: The endotracheal tube terminates above the carina. The right-sided PICC line is well position. The enteric tube extends below the left hemidiaphragm. There is a dense retrocardiac airspace opacity which is worse when compared to prior study. There is no pneumothorax. There is vascular congestion with mild interstitial edema. There are small bilateral pleural effusions. There are few perihilar airspace opacities. IMPRESSION: 1. Lines and tubes as above. 2. Worsening retrocardiac airspace opacity favored to represent atelectasis. 3. Small bilateral pleural effusions. 4. Vascular congestion with mild interstitial edema. Electronically Signed   By: Constance Holster M.D.   On: 02/20/2020 05:43   DG Chest Port 1 View  Result Date: 02/19/2020 CLINICAL DATA:  65 year old male intubated. Cardiac arrest, cardiogenic shock. Multiple small acute cerebral infarcts. EXAM: PORTABLE CHEST 1 VIEW COMPARISON:  Portable chest 02/16/2020 and earlier. FINDINGS: Portable AP semi upright view at 0635 hours. Endotracheal tube tip in good position between the clavicles and carina. Enteric tube courses to the abdomen, tip not included. Right IJ Swan-Ganz catheter removed. New right PICC line in place, PICC line tip difficult to delineate. Lower lung volumes. Mild accentuation of cardiac size. Other mediastinal contours are within normal limits. Increased interstitial markings in both  lungs with perihilar crowding of markings, mild lung base hypo ventilation. No pneumothorax or pleural effusion. Paucity of bowel gas in the visible upper abdomen. Stable visualized osseous structures.  IMPRESSION: 1. Right IJ Swan-Ganz catheter removed. New right PICC line in place. Satisfactory ET tube and visible enteric tube. 2. Lower lung volumes with diffuse atelectasis suspected. Mild interstitial edema or viral/atypical respiratory infection difficult to exclude. Electronically Signed   By: Genevie Ann M.D.   On: 02/19/2020 08:27   VAS [place] UPPER EXTREMITY VENOUS DUPLEX  Result Date: 02/20/2020 UPPER VENOUS STUDY  Indications: Swelling, and PICC line. Limitations: Line, bandages and ventilation. Comparison Study: No prior study Performing Technologist: Sharion Dove RVS  Examination Guidelines: A complete evaluation includes B-mode imaging, spectral Doppler, color Doppler, and power Doppler as needed of all accessible portions of each vessel. Bilateral testing is considered an integral part of a complete examination. Limited examinations for reoccurring indications may be performed as noted.  Right Findings: +----------+------------+---------+-----------+----------+--------------+ RIGHT     CompressiblePhasicitySpontaneousProperties   Summary     +----------+------------+---------+-----------+----------+--------------+ IJV                                                 Not visualized +----------+------------+---------+-----------+----------+--------------+ Subclavian               Yes       Yes                             +----------+------------+---------+-----------+----------+--------------+ Axillary                 Yes       Yes                             +----------+------------+---------+-----------+----------+--------------+ Brachial      Full       Yes       Yes                             +----------+------------+---------+-----------+----------+--------------+ Cephalic      None                                                 +----------+------------+---------+-----------+----------+--------------+ Basilic       Full                                                  +----------+------------+---------+-----------+----------+--------------+ Not all segments of the brachial or basilic veins were visualized, secondary to line and bandages. IJV not visualized secondary to ventilator.  Left Findings: +----------+------------+---------+-----------+----------+-------+ LEFT      CompressiblePhasicitySpontaneousPropertiesSummary +----------+------------+---------+-----------+----------+-------+ Subclavian               Yes       Yes                      +----------+------------+---------+-----------+----------+-------+  Summary:  Right: No evidence of deep vein thrombosis in the upper extremity. Findings consistent with  acute superficial vein thrombosis involving the right cephalic vein.  Left: No evidence of thrombosis in the subclavian.  *See table(s) above for measurements and observations.  Diagnosing physician: Ruta Hinds MD Electronically signed by Ruta Hinds MD on 02/20/2020 at 5:18:01 PM.    Final      Medications:     Scheduled Medications: . aspirin  81 mg Per Tube Daily  . bisacodyl  10 mg Rectal Daily  . chlorhexidine gluconate (MEDLINE KIT)  15 mL Mouth Rinse BID  . Chlorhexidine Gluconate Cloth  6 each Topical Daily  . clopidogrel  75 mg Per Tube Daily  . feeding supplement (PROSource TF)  90 mL Per Tube TID  . fentaNYL (SUBLIMAZE) injection  50 mcg Intravenous Once  . furosemide  80 mg Intravenous BID  . insulin aspart  0-15 Units Subcutaneous Q4H  . mouth rinse  15 mL Mouth Rinse 10 times per day  . metoCLOPramide (REGLAN) injection  10 mg Intravenous Daily  . neostigmine  0.25 mg Subcutaneous Q6H  . pantoprazole (PROTONIX) IV  40 mg Intravenous Daily  . polyethylene glycol  17 g Per Tube BID  . senna-docusate  1 tablet Per Tube BID  . sodium chloride flush  10-40 mL Intracatheter Q12H  . sodium chloride flush  3 mL Intravenous Q12H  . sorbitol  60 mL Per Tube Q0600  . spironolactone  12.5 mg Per Tube Daily     Infusions: . sodium chloride    . ceFEPime (MAXIPIME) IV Stopped (02/20/20 2150)  . dexmedetomidine (PRECEDEX) IV infusion 1 mcg/kg/hr (02/21/20 0300)  . feeding supplement (VITAL 1.5 CAL) Stopped (02/17/20 1700)  . fentaNYL infusion INTRAVENOUS 100 mcg/hr (02/21/20 0300)  . lactated ringers 20 mL/hr at 02/21/20 0300  . midazolam 2 mg/hr (02/20/20 0700)  . norepinephrine (LEVOPHED) Adult infusion 2 mcg/min (02/21/20 0300)  . vancomycin Stopped (02/20/20 2351)    PRN Medications: sodium chloride, sodium chloride, acetaminophen **OR** acetaminophen (TYLENOL) oral liquid 160 mg/5 mL **OR** acetaminophen, fentaNYL, midazolam, ondansetron (ZOFRAN) IV, sodium chloride flush, sodium chloride flush   Assessment/Plan:   1. Cardiac (VTarrest in setting of acute inferolateral STEMI/VT - OOH cardiac arrest on 1/12 with prolonged CPR, resuscitated w/ CPR + defibrillation   - Emergent cath 1/12 w/ totally occluded mid LCx treated w/ PCI + DES - Recurrent VT on 1/14 -> defib x 1 - Off amiodarone -Replete electrolytes PRN  2. Acute systolic HF due in setting of acute MI -> cardiogenic shock - EF 25% in cath lab 1/12 - Echo 1/13 EF 50-55% - Impella pulled 1/14 - Co-ox 67%, CVP 4 - c/w diuretics   3. CAD with acute inferolateral  STEMI  - Emergent cath 1/12 w/ totally occluded mid LCx treated w/ PCI + DES - There is moderate ostial LAD disease and significant disease in the distal LAD close to the apex. No significant disease affecting the right coronary artery. - Continue DAPT. Brilinta switched to Plavix; Plavix therapeutic per P2y12 lab.  - No b-blocker yet with shock  4. Acute CVA with ICH - left-sided weakness --> now not following commands; repeat CT shows stable hemorrhage - MRI with multiple small infarcts and small ICH (Likely cardioembolic from arrest) - Neuro following - Brilinta switched to Plavix - Keep SBP 110-140 - PT/OT following  5. Acute hypoxic respiratory  failure in setting of cardiac arrest - CXR atelectasis, CPT 8.54-->6 -> 3.7; FiO2 50% - Broadened abx d/t fever (Blood cx NGTD, sputum growing similar flora from  previous respiratory cultures from 1/14)  - Continue to wean vent and lasix 80 BID  6, Ileus - difficult situation. QT 486 with recent VT but needs to hold down Plavix (not candidate for cangrelor due to Tensed).  - Had small BM - Continue stool softeners and laxatives  7. AKI - due to ATN -> resolved  8. DM2 - HgbA1c 6.0, -SSI - Add Jardiance prior to d/c  9. Bradycardia -This evening progressive bradycardia but HR does respond with agitation - monitor while on precedex  10.  Febrile, max 102.4 - urine, blood and respiratory cultures sent NGTD - WBC today 9.3, fever curve coming down  11. R Cephalic superficial venous thrombosis Continue to monitor    Length of Stay: McDonald Chapel NP 02/21/2020, 5:08 AM  Advanced Heart Failure Team Pager [phone] (M-F; 7a - 4p)  Please contact [facility] for night-coverage after hours (4p -7a ) and weekends on [email or web address]   Agree. Remains intubated. Not as responsive. Unable to wean sedation due to agitation. Remains on NE 2 due to sedation. Remains febrile to 102. Co-ox ok. Weight back down to baseline.  General:  Intubated sedated not following commands  HEENT: normal + ETT Neck: supple. no JVD. Carotids 2+ bilat; no bruits. No lymphadenopathy or thryomegaly appreciated. Cor: PMI nondisplaced. Regular rate & rhythm. No rubs, gallops or murmurs. Lungs: clear Abdomen: soft, nontender, nondistended. No hepatosplenomegaly. No bruits or masses. Good bowel sounds. Extremities: no cyanosis, clubbing, rash, edema Neuro: ads above  He is less responsive today. Having trouble weaning sedation due to agitation. Have d/w CCM. Will stop sedation and plan extubation to better assess mental status and neuro deficits. Volume status much improved. Will give one more dose lasix to  facilitate successful extubation.   Aggrastat started overnight but P2Y12 came back therapeutic so stooped. Plavix restarted. Wil follow P2Y12.   Continue broad spectrum abx. Repeat covid testing.   PICC line pulled back but still in adequate position for use.   CRITICAL CARE Performed by: Glori Bickers  Total critical care time: 35 minutes  Critical care time was exclusive of separately billable procedures and treating other patients.  Critical care was necessary to treat or prevent imminent or life-threatening deterioration.  Critical care was time spent personally by me (independent of midlevel providers or residents) on the following activities: development of treatment plan with patient and/or surrogate as well as nursing, discussions with consultants, evaluation of patient's response to treatment, examination of patient, obtaining history from patient or surrogate, ordering and performing treatments and interventions, ordering and review of laboratory studies, ordering and review of radiographic studies, pulse oximetry and re-evaluation of patient's condition.  Glori Bickers, MD  9:17 AM

## 2020-02-21 NOTE — Progress Notes (Signed)
After sedation meds were decreased and subsequently turned off, patient was having a very difficult time being aroused, which was quite a different neuro assessment compare to previous. MD made aware. MD made decision to extubate. Patient placed on [place] 6L.

## 2020-02-21 NOTE — Procedures (Signed)
Cortrak  Person Inserting Tube:  Maleke Feria, RD Tube Type:  Cortrak - 43 inches Tube Location:  Left nare Initial Placement:  Postpyloric Secured by: Bridle Technique Used to Measure Tube Placement:  Documented cm marking at nare/ corner of mouth Cortrak Secured At:  87 cm    X-ray is required, abdominal x-ray has been ordered by the Cortrak team. Please confirm tube placement before using the Cortrak tube.   If the tube becomes dislodged please keep the tube and contact the Cortrak team at [email or web address] ([email or web address]) for replacement.  If after hours and replacement cannot be delayed, place a NG tube and confirm placement with an abdominal x-ray.    Vanessa Kick RD, LDN Clinical Nutrition Pager listed in AMION

## 2020-02-21 NOTE — Progress Notes (Signed)
NAME:  Jeffrey Morse, MRN:  [id number], DOB:  [birth year], LOS: 6 ADMISSION DATE:  02/15/2020, CONSULTATION DATE:  02/15/20 REFERRING MD:  Junious Dresser  CHIEF COMPLAINT:  VF arrest   Brief History   Jeffrey Morse is a 65 y.o. male who was brought to [facility] ED 1/12 with VT arrest after he collapsed at [facility] (had gone there for chest pain).  He had multiple recurrent arrests before being taken to cath lab where he was found to have severe LV dysfunction with EF 25-35%, and 100% prox Cx stenosis.  He had DES and Impella placed and was then transferred to the ICU where PCCM was asked to assist with vent management.  History of present illness   Pt is encephelopathic; therefore, this HPI is obtained from chart review. Jeffrey Morse is a 65 y.o. male who has unknown PMH.  He was going to [facility] 1/12 for chest pain and before he got inside, he collapsed and went unresponsive.  Family was with him and began CPR while awaiting EMS.  On EMS arrival, he was found to have VT.  He was shocked once and had ROSC; however, he proceeded to have multiple recurrent arrests.  He was brought to [facility] and taken straight to cath lab where he was found to have severe LV dysfunction EF 25 - 35%, Ost LAD 40% stenosed, Dist LAD 90% stenosed, 1st Marg lesion 90% stenosed, Ost Cx 40% stenosed, Prox Cx 100% stenosed.   He had DES and Impella placed and was transferred to the ICU where PCCM was asked to assist with vent management.  He is currently on 107mcg/min levophed.  Past Medical History  Unknown. has Cardiogenic shock (HCC); Cardiac arrest [facility]); ST elevation myocardial infarction (STEMI) (HCC); Encounter for imaging study to confirm orogastric (OG) tube placement; Acute respiratory failure with hypoxia (HCC); Cerebral thrombosis with cerebral infarction; and Subarachnoid hemorrhage on their problem list.  Significant Hospital Events   1/12 > admit. 1/14 > brief (2 min) VT arrest 1/14 > not moving LUE, multiple small infarcts  found 1/16 > stable head CT, issues with ileus, spiked fever despite ceftriaxone- broadened to vanc/cefepime 1/17 > RUE swelling, duplex ordered, ongoing ileus 1/18 > R cephalic vein clot, NTD, SAT/SBT  Consults:  Cards, PCCM, neuro  Procedures:  ETT 1/12 >  Impella 1/12 >  Significant Diagnostic Tests:  Cath lab 1/12 > Severe LV dysfunction EF 25 - 35%, Ost LAD 40% stenosed, Dist LAD 90% stenosed, 1st Marg lesion 90% stenosed, Ost Cx 40% stenosed, Prox Cx 100% stenosed.  DES placed, Impella placed. Echo 1/13 >  1. Left ventricular ejection fraction, by estimation, is 50 to 55%. The  left ventricle has mildly decreased function. The left ventricle  demonstrates regional wall motion abnormalities. Posterior wall  hypokinesis.  2. Impella measures about 3cm into LV cavity from aortic valve  3. Right ventricular systolic function is normal. The right ventricular  size is normal.   MRI Brain 1/14  IMPRESSION: 1. Multiple small acute infarcts within the frontal lobes. 2. New right thalamic microhemorrhage, since 01/18/12. 3. Normal intracranial MRA.  Micro Data:  Flu 1/12 > negative. COVID 1/12 > negative. See micro tab  Antimicrobials:  See fever tab  Interim history/subjective:  Had BM! Continues to diurese well  Objective:  Blood pressure 126/86, pulse 71, temperature (!) 102.1 F (38.9 C), temperature source Oral, resp. rate (!) 32, height 5\' 10"  (1.778 m), weight 96.4 kg, SpO2 98 %. CVP:  [1  mmHg-7 mmHg] 7 mmHg  Vent Mode: PSV;CPAP FiO2 (%):  [40 %-50 %] 40 % Set Rate:  [18 bmp] 18 bmp Vt Set:  [560 mL] 560 mL PEEP:  [5 cmH20-6 cmH20] 5 cmH20 Pressure Support:  [8 cmH20-10 cmH20] 10 cmH20 Plateau Pressure:  [13 cmH20-17 cmH20] 14 cmH20   Intake/Output Summary (Last 24 hours) at 02/21/2020 0998 Last data filed at 02/21/2020 0700 Gross per 24 hour  Intake 2871.1 ml  Output 6060 ml  Net -3188.9 ml   Filed Weights   02/19/20 0420 02/20/20 0540 02/21/20 0600   Weight: 102.4 kg 99.7 kg 96.4 kg    Examination: Constitutional: ill appearing man on vent  Eyes: pupils pinpoint but equal Ears, nose, mouth, and throat: ETT in place, copious thick secretions Cardiovascular: RRR, ext warm Respiratory: scattered rhonci, triggering vent Gastrointestinal: soft, hypoactive BS, bilious fluid from NGT Skin: No rashes, normal turgor Neurologic: Moves R to command, flicker LLE, unable to get to move LUE, very agitated with sedation wean and dysynchronous so had to re-sedated Psychiatric: RASS +2 Ext: RUE swollen/red near PICC site  Sugars a bit low SvO2 67% Cr okay CBC stable  Assessment & Plan:  Acute hypoxic resp failure post cardiac arrest- CXR now clear OOH Vtach cardiac arrest- suspect related to prox L circ occlusion post DES Cardiogenic shock after arrest- post impella, improving, CHF team following, impella now out and SvO2 down, lingering mild pressor requirement likely from sedation HCAP vs. Aspiration PNA- still febrile despite broad spectrum abx, question central IH Vtach cardiac arrest- brief, 1/14,  Thought due to ischemic QT prolongation and hypokalemia Embolic strokes, thalamic with hemorrhagic conversion- small, hopefully will have good return of function; stable on imaging 1/16 Ileus vs. Gastroparesis- had BM, hopefully turned the corner here Hypokalemia  RUE cephalic vein thrombus Persistent fevers: question related to RUE thrombus, is next to a couple   - continue vanc/cefepime, check covid swab, LE duplex - antiplatelets, GDT, AC, antiarrythmics per CHF team and neurology discussion - diuretic discussion with CHF team - Continue neostigmine, standing suppositories   - try to get postpyloric cortrak in today - SSI - VAP prevention bundle - going to give him a shot at extubation today to get better idea of mental status without ETT in - updated family at length  Best Practice (evaluated daily):  Diet: NGT to LIS pending  postpyloric cortrak placement Pain/Anxiety/Delirium protocol (if indicated): precedex, wean as able VAP protocol (if indicated): In place. DVT prophylaxis: SCDs GI prophylaxis: PPI. Glucose control: Insulin ssi Mobility: Bedrest. Disposition: ICU.  Goals of Care:  Per primary   Patient critically ill due to respiratory failure, cardiogenic shock, ileus Interventions to address this today ventilator and sedation titration Risk of deterioration without these interventions is high  I personally spent 35 minutes providing critical care not including any separately billable procedures  Myrla Halsted MD [facility] 02/21/2020 8:23 AM Personal pager: [phone] If unanswered, please page CCM On-call: #[phone]

## 2020-02-21 NOTE — Progress Notes (Addendum)
STROKE TEAM PROGRESS NOTE     INTERVAL HISTORY No acute overnight events.  Patient evaluated at bedside, no family in the room.  Patient is off ventilator today 02/21/2020 but remains sedated.  He is febrile, difficult to arouse, followed 1 or 2 commands but globally aphasic.  RN noticed patient not moving his right leg but on examination he moved a couple of times so that is why holding off to repeat CT head but advised to repeat it immediately if his condition worsens febrile, blood pressure still labile at times, grimacing in pain.  Patient is difficult to evaluate and do full neurological exam due to sedation and noncooperation.  Patient had a small bowel movement yesterday.  As patient was undergoing COVID testing and wife was in the waiting room, went and discussed patient's current situation with wife there and also informed sister on the phone.  Neurological exam unchanged at this point.   Vitals:   02/21/20 1415 02/21/20 1430 02/21/20 1445 02/21/20 1500  BP: (!) 195/106 (!) 165/102 (!) 167/95 (!) 154/79  Pulse: (!) 119 (!) 117 (!) 115 (!) 118  Resp: (!) 25 (!) 27 (!) 32 (!) 29  Temp:      TempSrc:      SpO2: 98% 99% 96% 96%  Weight:      Height:       CBC:  Recent Labs  Lab 02/15/20 1114 02/15/20 1140 02/20/20 0504 02/20/20 0508 02/21/20 0452 02/21/20 0536  WBC 14.2*   < > 6.9  --  8.3  --   NEUTROABS 7.8*  --   --   --   --   --   HGB 14.4   < > 8.9*   < > 9.9* 9.9*  HCT 45.7   < > 27.7*   < > 30.4* 29.0*  MCV 90.7   < > 89.6  --  88.1  --   PLT 280   < > 172  --  223  --    < > = values in this interval not displayed.   Basic Metabolic Panel:  Recent Labs  Lab 02/20/20 0504 02/20/20 0508 02/20/20 1400 02/21/20 0452 02/21/20 0536  NA 141   < > 141 141 142  K 3.2*   < > 4.0 3.0* 3.1*  CL 102  --  103 102  --   CO2 24  --  26 24  --   GLUCOSE 128*  --  129* 124*  --   BUN 23  --  21 22  --   CREATININE 1.04  --  0.90 1.02  --   CALCIUM 8.5*  --  8.4* 8.4*  --    MG 2.0  --   --  1.9  --   PHOS 3.8  --   --  3.8  --    < > = values in this interval not displayed.   Lipid Panel:  Recent Labs  Lab 02/18/20 1032  CHOL 133  TRIG 97  HDL 38*  CHOLHDL 3.5  VLDL 19  LDLCALC 76   HgbA1c:  Recent Labs  Lab 02/18/20 0500  HGBA1C 6.1*    IMAGING past 24 hours  VAS [place] Lower extremity 02/21/2020  Summary:  BILATERAL:  - No evidence of deep vein thrombosis seen in the lower extremities,  bilaterally.  - No evidence of superficial venous thrombosis in the lower extremities,  bilaterally.  -No evidence of popliteal cyst, bilaterally.     CT Head wo contrast  02/20/2019  IMPRESSION: 1. Unchanged punctate focus of blood at the right thalamus. 2. Faint hypoattenuation in the frontal lobes correspond to known recent infarcts.  CT Head Wo Contrast 02/17/2020 IMPRESSION:  1. Subcentimeter hyperdense focus within the right thalamic region, suspicious for a small acute bleed. MRI correlation is recommended.  2. Mild cerebral atrophy.  3. Moderate to marked severity paranasal sinus disease.  MR BRAIN WO CONTRAST MR ANGIO HEAD WO CONTRAST 02/17/2020 IMPRESSION:  1. Multiple small acute infarcts within the frontal lobes.  2. New right thalamic microhemorrhage, since 01/18/12.  3. Normal intracranial MRA.   DG CHEST PORT 1 VIEW 02/16/2020 IMPRESSION:  Low lung volumes persist, with likely atelectasis at the lung bases and no overt edema or confluent airspace disease. Unchanged position of endotracheal tube, gastric tube, right IJ sheath/Swan-Ganz.   DG Abd Portable 1V 02/17/2020 IMPRESSION:  Enteric tube in satisfactory position, nonobstructive bowel gas pattern.   ECHOCARDIOGRAM LIMITED 02/16/2020 IMPRESSIONS   1. Left ventricular ejection fraction, by estimation, is 50 to 55%. The left ventricle has mildly decreased function. The left ventricle demonstrates regional wall motion abnormalities. Posterior wall hypokinesis.   2.  Impella measures about 3cm into LV cavity from aortic valve   3. Right ventricular systolic function is normal. The right ventricular size is normal.   Bilateral Carotid Dopplers  02/18/2020 Bilateral mild thickening without hemodynamically significant stenosis.  Vertebral artery flow is antegrade on the left not visualized on the right.  VAS [place] upper extremity Venous Duplex Right 02/20/2020  Summary:    Right:  No evidence of deep vein thrombosis in the upper extremity. Findings  consistent  with acute superficial vein thrombosis involving the right cephalic vein.    Left:  No evidence of thrombosis in the subclavian.   PHYSICAL EXAM  General: Well-developed well-nourished male who is extubated now HEENT: [place]/AT, endotracheal tube in place Cardiovascular: Rate and rhythm Pulmonary: Clear to auscultation bilaterally Abdomen: Soft, nondistended, nontender Extremities: No peripheral edema Neurological:  Mental Status:  Sedated on fentanyl drip arouses minimally to sternal rub  . Opens eyes partially to sternal rub but does not follow any commands or speak  cranial Nerves: II III,IV, VI: Does not blink to threat on either side., pupils equal, round, reactive to light and accommodation, slight skew deviation with right eye hypertropia.   V,VII: normal VIII: hearing normal  IX,X: Unable to perform XI: Unable to perform XII: Unable to perform  Motor: Flaccid left upper extremity to noxious stimulation, left lower extremity has minimal flexion response to painful stimuli. Right extremities able to lift  against gravity purposefully upon stimulation but right lower extremity minimum response Sensory: Pinprick and light touch intact throughout, bilaterally Cerebellar: Unable to perform Gait: Deferred  ASSESSMENT/PLAN [name] is a 65 y.o. male with history of hypertension, hyperlipidemia and OSA presenting 02/15/20 with cardiac arrest with successful PCI and  drug-eluting stent placement left circumflex complicated by hypotension (Impella device) with subsequent left sided weakness. He did not receive IV t-PA due to late presentation (>4.5 hours from time of onset).   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  As well as embolization from distal clot on the Impella device  persistent altered mental status likely multifactorial encephalopathy due to ongoing febrile illness as well as medication effect of sedating medications  CT head: Subcentimeter hyperdense focus within the right thalamic region, suspicious for a small acute bleed. MRI correlation is  recommended. Mild cerebral atrophy.   MRI : Multiple small acute infarcts within the frontal lobes. New right thalamic microhemorrhage, since 01/18/12.  MRA : Normal intracranial MRA.  Carotid Doppler: Bilateral mild thickening without hemodynamically significant stenosis.  Vertebral artery flow is antegrade on the left not visualized on the right.  2D Echo: EF 50 - 55%. No cardiac source of emboli identified.   VAS [place] upper extremity: Acute superficial vein thrombosis involving the right cephalic vein.  S[place] Corona Virus 2 - negative  LDL 135  HgbA1c 6.1  VTE prophylaxis - none    Diet   Diet NPO time specified    No antithrombotic prior to admission, now on aspirin 81 mg daily and clopidogrel 75 mg daily.   Patient will be counseled to be compliant with his antithrombotic medications  Ongoing aggressive stroke risk factor management  Therapy recommendations:  pending  Disposition:  pending  Hypertension  Home BP meds: Zestril  Current BP meds: norepinephrine ; vasopressin  Stable  Long-term BP goal normotensive  Hyperlipidemia  Home Lipid lowering medication: Zocor 20 mg daily  LDL 135, goal < 70  Current lipid lowering medication: consider Lipitor 80 mg daily   Continue statin at  discharge   Other Active Problems, Findings, Recommendations and/or Plan  Code status -  Full  Intubated   NPO -> tube feedings  Hyperglycemia ->SSI    Anemia - Hgb - 9.0  Mild thrombocytopenia - platelets - 138  HCAP vs Aspiration pneumonia - Rocephin started 02/16/20, Febrile on Ceftriaxone (Rocephin) , so changed to Vancomycin and Cefepime on 1/18.    Hospital day # 6 Patient is extubated today and seems to be breathing fairly well but neurological exam is yet compromised by his sedation which was just stopped and he also has fever which may be contributing. Recommend avoid sedation as much as possible and hopefully his mental status will improve gradually. If there is further worsening of neurological exam may consider repeating CT.Marland Kitchen     Discussed with patient's wife at the bedside and sister over the phone and answered questions.  Discussed with  Dr. Gala Romney.  Greater than 50% time during this 35-minute visit was spent on counseling and coordination of care about a stroke and answering questions.  Delia Heady, MD     To contact Stroke Continuity provider, please refer to [email or web address]. After hours, contact General Neurology

## 2020-02-21 NOTE — Progress Notes (Signed)
Lower extremity venous has been completed.   Preliminary results in CV Proc.   Blanch Media 02/21/2020 1:54 PM

## 2020-02-21 NOTE — Progress Notes (Signed)
Nutrition Follow-up  DOCUMENTATION CODES:   Not applicable  INTERVENTION:   Initiate tube feeds via post-pyloric Cortrak: - Start Vital 1.5 @ 20 ml/hr and advance by 10 ml q 4 hours to goal rate of 60 ml/hr (1440 ml/day) - ProSource TF 90 ml BID  Tube feeding regimen at goal provides 2320 kcal, 141 grams of protein, and 1100 ml of H2O.   NUTRITION DIAGNOSIS:   Increased nutrient needs related to post-op healing as evidenced by estimated needs.  Ongoing  GOAL:   Patient will meet greater than or equal to 90% of their needs  Met via TF at goal  MONITOR:   Diet advancement,Labs,Weight trends,TF tolerance,Skin,I & O's  REASON FOR ASSESSMENT:   Consult Enteral/tube feeding initiation and management  ASSESSMENT:   Patient with PMH of HTN.  Presents this admission with VT arrest.  01/12 - s/p cathwith DES x 1 and impella placement 01/14 - code blue for spontaneous pulseless VT arrest, ROSC achieved after 3 minutes, impella removed, MRI with multiple small infarcts and small ICH 01/18 - extubated, Cortrak placed with tip at LOT  Discussed pt with CCM MD and with RN. Plan is to resume tube feeds via post-pyloric Cortrak and slowly advance to goal. Pt remains NPO at this time.  Levophed off this AM.  Per CCM, pt with ileus vs gastroparesis but likely resolving as pt is having bowel movements at this time.   Admit weight: 96 kg Current weight: 96.4 kg  Medications reviewed and include: dulcolax, SSI q 4 hours, IV reglan 10 mg daily, neostigmine, IV protonix, miralax, senna, sorbitol daily, spironolactone, IV abx, IV KCl 10 mEq x 4 runs IVF: LR @ 20 ml/hr  Labs reviewed: potassium 3.1, hemoglobin 9.9 CBG's: 118-136 x 24 hours  UOP: 5235 ml x 24 hours OG output (now removed): 825 ml x 24 hours I/O's: -3.0 L since admit  Diet Order:   Diet Order            Diet NPO time specified  Diet effective now                 EDUCATION NEEDS:   Not appropriate for  education at this time  Skin:  Skin Assessment: Skin Integrity Issues: Incisions: abdomen  Last BM:  02/21/20 type 7 and type 6  Height:   Ht Readings from Last 1 Encounters:  02/15/20 _0  (1.778 m)    Weight:   Wt Readings from Last 1 Encounters:  02/21/20 96.4 kg    BMI:  Body mass index is 30.49 kg/m.  Estimated Nutritional Needs:   Kcal:  2300-2500  Protein:  140-165 grams  Fluid:  >/= 2 L/day    Gustavus Bryant, MS, RD, LDN Inpatient Clinical Dietitian Please see AMiON for contact information.

## 2020-02-21 NOTE — Progress Notes (Signed)
Patient followed commands. He moved his left arm when cortrak was being placed around 10AM. He can stick out his tongue and open is mouth to command as well.

## 2020-02-22 DIAGNOSIS — I6330 Cerebral infarction due to thrombosis of unspecified cerebral artery: Secondary | ICD-10-CM | POA: Diagnosis not present

## 2020-02-22 DIAGNOSIS — J9601 Acute respiratory failure with hypoxia: Secondary | ICD-10-CM | POA: Diagnosis not present

## 2020-02-22 LAB — POCT I-STAT EG7
Acid-Base Excess: 0 mmol/L (ref 0.0–2.0)
Bicarbonate: 24 mmol/L (ref 20.0–28.0)
Calcium, Ion: 1.2 mmol/L (ref 1.15–1.40)
HCT: 29 % — ABNORMAL LOW (ref 39.0–52.0)
Hemoglobin: 9.9 g/dL — ABNORMAL LOW (ref 13.0–17.0)
O2 Saturation: 49 %
Patient temperature: 99.1
Potassium: 3.3 mmol/L — ABNORMAL LOW (ref 3.5–5.1)
Sodium: 150 mmol/L — ABNORMAL HIGH (ref 135–145)
TCO2: 25 mmol/L (ref 22–32)
pCO2, Ven: 35 mmHg — ABNORMAL LOW (ref 44.0–60.0)
pH, Ven: 7.445 — ABNORMAL HIGH (ref 7.250–7.430)
pO2, Ven: 25 mmHg — CL (ref 32.0–45.0)

## 2020-02-22 LAB — GLUCOSE, CAPILLARY
Glucose-Capillary: 154 mg/dL — ABNORMAL HIGH (ref 70–99)
Glucose-Capillary: 181 mg/dL — ABNORMAL HIGH (ref 70–99)
Glucose-Capillary: 182 mg/dL — ABNORMAL HIGH (ref 70–99)
Glucose-Capillary: 191 mg/dL — ABNORMAL HIGH (ref 70–99)
Glucose-Capillary: 219 mg/dL — ABNORMAL HIGH (ref 70–99)
Glucose-Capillary: 220 mg/dL — ABNORMAL HIGH (ref 70–99)

## 2020-02-22 LAB — BASIC METABOLIC PANEL
Anion gap: 11 (ref 5–15)
Anion gap: 11 (ref 5–15)
BUN: 27 mg/dL — ABNORMAL HIGH (ref 8–23)
BUN: 27 mg/dL — ABNORMAL HIGH (ref 8–23)
CO2: 24 mmol/L (ref 22–32)
CO2: 23 mmol/L (ref 22–32)
Calcium: 8.8 mg/dL — ABNORMAL LOW (ref 8.9–10.3)
Calcium: 8.5 mg/dL — ABNORMAL LOW (ref 8.9–10.3)
Chloride: 108 mmol/L (ref 98–111)
Chloride: 112 mmol/L — ABNORMAL HIGH (ref 98–111)
Creatinine, Ser: 0.87 mg/dL (ref 0.61–1.24)
Creatinine, Ser: 0.83 mg/dL (ref 0.61–1.24)
GFR, Estimated: >60 mL/min (ref >60)
GFR, Estimated: >60 mL/min (ref >60)
Glucose, Bld: 195 mg/dL — ABNORMAL HIGH (ref 70–99)
Glucose, Bld: 196 mg/dL — ABNORMAL HIGH (ref 70–99)
Potassium: 2.8 mmol/L — ABNORMAL LOW (ref 3.5–5.1)
Potassium: 3.3 mmol/L — ABNORMAL LOW (ref 3.5–5.1)
Sodium: 143 mmol/L (ref 135–145)
Sodium: 146 mmol/L — ABNORMAL HIGH (ref 135–145)

## 2020-02-22 LAB — POCT I-STAT 7, (LYTES, BLD GAS, ICA,H+H)
Acid-Base Excess: 3 mmol/L — ABNORMAL HIGH (ref 0.0–2.0)
Bicarbonate: 25.7 mmol/L (ref 20.0–28.0)
Calcium, Ion: 1.19 mmol/L (ref 1.15–1.40)
HCT: 30 % — ABNORMAL LOW (ref 39.0–52.0)
Hemoglobin: 10.2 g/dL — ABNORMAL LOW (ref 13.0–17.0)
O2 Saturation: 96 %
Patient temperature: 99.7
Potassium: 2.9 mmol/L — ABNORMAL LOW (ref 3.5–5.1)
Sodium: 147 mmol/L — ABNORMAL HIGH (ref 135–145)
TCO2: 27 mmol/L (ref 22–32)
pCO2 arterial: 32.3 mmHg (ref 32.0–48.0)
pH, Arterial: 7.511 — ABNORMAL HIGH (ref 7.350–7.450)
pO2, Arterial: 75 mmHg — ABNORMAL LOW (ref 83.0–108.0)

## 2020-02-22 LAB — CBC
HCT: 31.5 % — ABNORMAL LOW (ref 39.0–52.0)
Hemoglobin: 10.3 g/dL — ABNORMAL LOW (ref 13.0–17.0)
MCH: 29 pg (ref 26.0–34.0)
MCHC: 32.7 g/dL (ref 30.0–36.0)
MCV: 88.7 fL (ref 80.0–100.0)
Platelets: 271 10*3/uL (ref 150–400)
RBC: 3.55 MIL/uL — ABNORMAL LOW (ref 4.22–5.81)
RDW: 14.3 % (ref 11.5–15.5)
WBC: 9.2 10*3/uL (ref 4.0–10.5)
nRBC: 0 % (ref 0.0–0.2)

## 2020-02-22 LAB — PHOSPHORUS: Phosphorus: 2.1 mg/dL — ABNORMAL LOW (ref 2.5–4.6)

## 2020-02-22 LAB — COOXEMETRY PANEL
Carboxyhemoglobin: 1.1 % (ref 0.5–1.5)
Methemoglobin: 0.7 % (ref 0.0–1.5)
O2 Saturation: 67.1 %
Total hemoglobin: 15.7 g/dL (ref 12.0–16.0)

## 2020-02-22 LAB — MAGNESIUM: Magnesium: 2.2 mg/dL (ref 1.7–2.4)

## 2020-02-22 LAB — PROCALCITONIN: Procalcitonin: 0.9 ng/mL

## 2020-02-22 MED ORDER — CHLORHEXIDINE GLUCONATE 0.12 % MT SOLN
15.0000 mL | Freq: Two times a day (BID) | OROMUCOSAL | Status: DC
  Administered 2020-02-22 – 2020-02-23 (×2): 15 mL via OROMUCOSAL
  Filled 2020-02-22 (×2): qty 15

## 2020-02-22 MED ORDER — POTASSIUM CHLORIDE 20 MEQ PO PACK
40.0000 meq | PACK | Freq: Once | ORAL | Status: AC
  Administered 2020-02-22: 40 meq
  Filled 2020-02-22: qty 2

## 2020-02-22 MED ORDER — POTASSIUM CHLORIDE CRYS ER 20 MEQ PO TBCR
40.0000 meq | EXTENDED_RELEASE_TABLET | Freq: Once | ORAL | Status: AC
  Administered 2020-02-22: 40 meq via ORAL
  Filled 2020-02-22: qty 2

## 2020-02-22 MED ORDER — CLOPIDOGREL BISULFATE 75 MG PO TABS
75.0000 mg | ORAL_TABLET | Freq: Every day | ORAL | Status: DC
  Administered 2020-02-23 – 2020-02-25 (×3): 75 mg via ORAL
  Filled 2020-02-22 (×3): qty 1

## 2020-02-22 MED ORDER — ORAL CARE MOUTH RINSE: 15.0000 mL | Freq: Two times a day (BID) | OROMUCOSAL | Status: DC

## 2020-02-22 MED ORDER — POTASSIUM CHLORIDE 10 MEQ/50ML IV SOLN
10.0000 meq | INTRAVENOUS | Status: AC
Start: 2020-02-22 — End: 2020-02-22
  Administered 2020-02-22 (×2): 10 meq via INTRAVENOUS
  Filled 2020-02-22 (×2): qty 50

## 2020-02-22 MED ORDER — TAMSULOSIN HCL 0.4 MG PO CAPS
0.4000 mg | ORAL_CAPSULE | Freq: Every day | ORAL | Status: DC
  Administered 2020-02-22 – 2020-02-24 (×3): 0.4 mg via ORAL
  Filled 2020-02-22 (×3): qty 1

## 2020-02-22 MED ORDER — POTASSIUM CHLORIDE 10 MEQ/50ML IV SOLN
10.0000 meq | INTRAVENOUS | Status: AC
  Administered 2020-02-22 (×2): 10 meq via INTRAVENOUS
  Filled 2020-02-22 (×2): qty 50

## 2020-02-22 MED ORDER — POTASSIUM CHLORIDE 20 MEQ PO PACK
20.0000 meq | PACK | Freq: Once | ORAL | Status: AC
  Administered 2020-02-22: 20 meq via ORAL
  Filled 2020-02-22: qty 1

## 2020-02-22 MED ORDER — SPIRONOLACTONE 25 MG PO TABS
25.0000 mg | ORAL_TABLET | Freq: Every day | ORAL | Status: DC
  Administered 2020-02-23 – 2020-02-25 (×3): 25 mg via ORAL
  Filled 2020-02-22 (×3): qty 1

## 2020-02-22 MED ORDER — POTASSIUM PHOSPHATES 15 MMOLE/5ML IV SOLN
15.0000 mmol | Freq: Once | INTRAVENOUS | Status: AC
  Administered 2020-02-22: 15 mmol via INTRAVENOUS
  Filled 2020-02-22: qty 5

## 2020-02-22 MED ORDER — PANTOPRAZOLE SODIUM 40 MG PO TBEC
40.0000 mg | DELAYED_RELEASE_TABLET | Freq: Every day | ORAL | Status: DC
  Administered 2020-02-23 – 2020-02-25 (×3): 40 mg via ORAL
  Filled 2020-02-22 (×3): qty 1

## 2020-02-22 MED ORDER — SPIRONOLACTONE 25 MG PO TABS
25.0000 mg | ORAL_TABLET | Freq: Every day | ORAL | Status: DC
  Administered 2020-02-22: 25 mg
  Filled 2020-02-22: qty 1

## 2020-02-22 MED ORDER — PROSOURCE TF PO LIQD
90.0000 mL | Freq: Two times a day (BID) | ORAL | Status: DC
  Administered 2020-02-22 – 2020-02-23 (×2): 90 mL
  Filled 2020-02-22 (×3): qty 90

## 2020-02-22 MED ORDER — ASPIRIN 81 MG PO CHEW
81.0000 mg | CHEWABLE_TABLET | Freq: Every day | ORAL | Status: DC
  Administered 2020-02-23 – 2020-02-28 (×6): 81 mg via ORAL
  Filled 2020-02-22 (×6): qty 1

## 2020-02-22 MED ORDER — VITAL 1.5 CAL PO LIQD
1000.0000 mL | ORAL | Status: DC
  Administered 2020-02-22: 1000 mL

## 2020-02-22 MED ORDER — PANTOPRAZOLE SODIUM 40 MG PO PACK
40.0000 mg | PACK | Freq: Every day | ORAL | Status: DC
  Administered 2020-02-22: 40 mg
  Filled 2020-02-22: qty 20

## 2020-02-22 NOTE — Progress Notes (Addendum)
STROKE TEAM PROGRESS NOTE     INTERVAL HISTORY No acute overnight events.  Patient evaluated at bedside this morning, with wife present in the room.  Patient sitting comfortably in chair this morning.  Alert, awake, oriented to place and person not time.  Follows commands but appears drowsy at times.  He states he feels much better this morning.  Able to move his right extremities both upper and lower but remains significantly weak his left extremities.  His COVID testing came negative yesterday.  Remains febrile, blood pressure well controlled.  Neurological exam is improving.  Vitals:   02/22/20 0700 02/22/20 0800 02/22/20 0803 02/22/20 1000  BP: 132/77 117/74  (!) 145/82  Pulse: 88 87  94  Resp: 18 18  (!) 24  Temp:   98.1 F (36.7 C)   TempSrc:   Oral   SpO2: 98% 99%  100%  Weight:      Height:       CBC:  Recent Labs  Lab 02/15/20 1114 02/15/20 1140 02/21/20 0452 02/21/20 0536 02/22/20 0410 02/22/20 0456  WBC 14.2*   < > 8.3  --   --  9.2  NEUTROABS 7.8*  --   --   --   --   --   HGB 14.4   < > 9.9*   < > 10.2* 10.3*  HCT 45.7   < > 30.4*   < > 30.0* 31.5*  MCV 90.7   < > 88.1  --   --  88.7  PLT 280   < > 223  --   --  271   < > = values in this interval not displayed.   Basic Metabolic Panel:  Recent Labs  Lab 02/21/20 0452 02/21/20 0536 02/21/20 2225 02/22/20 0410 02/22/20 0456  NA 141   < > 143 147* 143  K 3.0*   < > 3.6 2.9* 2.8*  CL 102   < > 107  --  108  CO2 24   < > 23  --  24  GLUCOSE 124*   < > 185*  --  195*  BUN 22   < > 26*  --  27*  CREATININE 1.02   < > 1.00  --  0.87  CALCIUM 8.4*   < > 8.6*  --  8.8*  MG 1.9  --   --   --  2.2  PHOS 3.8  --   --   --  2.1*   < > = values in this interval not displayed.   Lipid Panel:  Recent Labs  Lab 02/18/20 1032  CHOL 133  TRIG 97  HDL 38*  CHOLHDL 3.5  VLDL 19  LDLCALC 76   HgbA1c:  Recent Labs  Lab 02/18/20 0500  HGBA1C 6.1*    IMAGING past 24 hours  VAS [place] Lower  extremity 02/21/2020  Summary:  BILATERAL:  - No evidence of deep vein thrombosis seen in the lower extremities,  bilaterally.  - No evidence of superficial venous thrombosis in the lower extremities,  bilaterally.  -No evidence of popliteal cyst, bilaterally.     CT Head wo contrast 02/20/2019  IMPRESSION: 1. Unchanged punctate focus of blood at the right thalamus. 2. Faint hypoattenuation in the frontal lobes correspond to known recent infarcts.  CT Head Wo Contrast 02/17/2020 IMPRESSION:  1. Subcentimeter hyperdense focus within the right thalamic region, suspicious for a small acute bleed. MRI correlation is recommended.  2. Mild cerebral atrophy.  3. Moderate to  marked severity paranasal sinus disease.  MR BRAIN WO CONTRAST MR ANGIO HEAD WO CONTRAST 02/17/2020 IMPRESSION:  1. Multiple small acute infarcts within the frontal lobes.  2. New right thalamic microhemorrhage, since 01/18/12.  3. Normal intracranial MRA.   DG CHEST PORT 1 VIEW 02/16/2020 IMPRESSION:  Low lung volumes persist, with likely atelectasis at the lung bases and no overt edema or confluent airspace disease. Unchanged position of endotracheal tube, gastric tube, right IJ sheath/Swan-Ganz.   DG Abd Portable 1V 02/17/2020 IMPRESSION:  Enteric tube in satisfactory position, nonobstructive bowel gas pattern.   ECHOCARDIOGRAM LIMITED 02/16/2020 IMPRESSIONS   1. Left ventricular ejection fraction, by estimation, is 50 to 55%. The left ventricle has mildly decreased function. The left ventricle demonstrates regional wall motion abnormalities. Posterior wall hypokinesis.   2. Impella measures about 3cm into LV cavity from aortic valve   3. Right ventricular systolic function is normal. The right ventricular size is normal.   Bilateral Carotid Dopplers  02/18/2020 Bilateral mild thickening without hemodynamically significant stenosis.  Vertebral artery flow is antegrade on the left not visualized on  the right.  VAS [place] upper extremity Venous Duplex Right 02/20/2020  Summary:    Right:  No evidence of deep vein thrombosis in the upper extremity. Findings  consistent  with acute superficial vein thrombosis involving the right cephalic vein.    Left:  No evidence of thrombosis in the subclavian.   PHYSICAL EXAM  General: Well-developed well-nourished male who is extubated now HEENT: [place]/AT, endotracheal tube in place Cardiovascular: Rate and rhythm Pulmonary: Clear to auscultation bilaterally Abdomen: Soft, nondistended, nontender Extremities: No peripheral edema Neurological:  Mental Status:  Alert, awake, oriented to place and person.  Follows commands appropriately. Cranial Nerves: II III,IV, VI: pupils equal, round, reactive to light and accommodation, slight skew deviation with right eye hypertropia.  V,VII: normal VIII: hearing normal  IX,X: Uvula rises symmetrically XI: Unable to perform XII: Midline tongue extension without atrophy or fasciculations  Motor: Right :  Upper extremity   4/5                                      Left:     Upper extremity   1/5             Lower extremity   4/5                                                  Lower extremity   1/5 Tone and bulk:normal tone throughout; no atrophy noted Sensory: Pinprick and light touch normal on in RUE and RLE, decreased on LUE and LLE Deep Tendon Reflexes:  Right: Upper Extremity                       Left: Upper extremity   biceps (C-5 to C-6) 2/4                       biceps (C-5 to C-6) 3/4 tricep (C7) 2/4                                     triceps (C7) 3/4 Brachioradialis (C6)  2/4                      Brachioradialis (C6) 3/4  Lower Extremity Lower Extremity  quadriceps (L-2 to L-4) 2/4                 quadriceps (L-2 to L-4) 3/4 Achilles (S1) 2/4                                  Achilles (S1) 3/4  Plantars: Right: downgoing                                Left:  downgoing Cerebellar: Unable to perform finger-to-nose,  unable to perform heel-to-shin test Gait: Deferred   ASSESSMENT/PLAN Jeffrey Morse is a 64 y.o. male with history of hypertension, hyperlipidemia and OSA presenting 02/15/20 with cardiac arrest with successful PCI and drug-eluting stent placement left circumflex complicated by hypotension (Impella device) with subsequent left sided weakness. He did not receive IV t-PA due to late presentation (>4.5 hours from time of onset).   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  As well as embolization from distal clot on the Impella device  persistent altered mental status likely multifactorial encephalopathy due to ongoing febrile illness as well as medication effect of sedating medications  CT head: Subcentimeter hyperdense focus within the right thalamic region, suspicious for a small acute bleed. MRI correlation is recommended. Mild cerebral atrophy.   MRI : Multiple small acute infarcts within the frontal lobes. New right thalamic microhemorrhage, since 01/18/12.  MRA : Normal intracranial MRA.  Carotid Doppler: Bilateral mild thickening without hemodynamically significant stenosis.  Vertebral artery flow is antegrade on the left not visualized on the right.  2D Echo: EF 50 - 55%. No cardiac source of emboli identified.   VAS [place] upper extremity: Acute superficial vein thrombosis involving the right cephalic vein.  [place] Corona Virus 2 - negative  LDL 135  HgbA1c 6.1  VTE prophylaxis - none    Diet   Diet NPO time specified    No antithrombotic prior to admission, now on aspirin 81 mg daily and clopidogrel 75 mg daily.   Patient will be counseled to be compliant with his antithrombotic medications  Ongoing aggressive stroke risk factor management  Therapy recommendations: CIR  Disposition:  pending  Hypertension  Home BP meds:  Zestril  Current BP meds: norepinephrine ; vasopressin  Stable  Long-term BP goal normotensive  Hyperlipidemia  Home Lipid lowering medication: Zocor 20 mg daily  LDL 135, goal < 70  Current lipid lowering medication: consider Lipitor 80 mg daily   Continue statin at discharge   Other Active Problems, Findings, Recommendations and/or Plan  Code status -  Full  Intubated   NPO -> tube feedings  Hyperglycemia ->SSI    Anemia - Hgb - 9.0  Mild thrombocytopenia - platelets - 138  HCAP vs Aspiration pneumonia - Rocephin started 02/16/20, Febrile on Ceftriaxone (Rocephin) , so changed to Vancomycin and Cefepime on 1/18.    Hospital day # 7 Patient neurological exam is much improved now that he is off sedation.  Continue physical occupational therapy and when hemodynamically stable consider transfer to inpatient rehab.  Continue aspirin and Plavix given his recent MI cardiac stent    discussed with  patient's wife at the bedside  and answered questions.     Greater than 50% time during this 25-minute visit was spent on counseling and coordination of care about a stroke and answering questions.  Delia Heady, MD     To contact Stroke Continuity provider, please refer to [email or web address]. After hours, contact General Neurology

## 2020-02-22 NOTE — Progress Notes (Signed)
eLink Physician-Brief Progress Note Patient Name: Jeffrey Morse DOB: [birth year] MRN: [id number]   Date of Service  02/22/2020  HPI/Events of Note  K+ 3.6  eICU Interventions  KCL 40 meq via NG tube x 1 per adult electrolyte replacement protocol.        [name] 02/22/2020, 2:13 AM

## 2020-02-22 NOTE — Progress Notes (Addendum)
Advanced Heart Failure Rounding Note   Subjective:    1/12 admitted for Vfib arrest, cath PCI/DES and impella 1/14 VT arrest, ROSC after 3 minutes.  1/15 Impella removed 1/16 Left sided weakness. MRI with multiple small infarcts and small ICH 1/18 Extubated.   Remains febrile. Confused. Did not sleep last night.   Blood Cx- NGTD  1/13 Echo EF ~50-55% LV small RV ok.   Objective:   Weight Range:  Vital Signs:   Temp:  [99.7 F (37.6 C)-102.5 F (39.2 C)] 99.8 F (37.7 C) (01/19 0600) Pulse Rate:  [61-120] 88 (01/19 0600) Resp:  [15-50] 22 (01/19 0600) BP: (93-195)/(59-134) 124/86 (01/19 0600) SpO2:  [94 %-100 %] 99 % (01/19 0600) Arterial Line BP: (94-187)/(49-93) 179/84 (01/18 1400) FiO2 (%):  [40 %] 40 % (01/18 0812) Weight:  [94.5 kg] 94.5 kg (01/19 0400) Last BM Date: 02/21/20  Weight change: Filed Weights   02/20/20 0540 02/21/20 0600 02/22/20 0400  Weight: 99.7 kg 96.4 kg 94.5 kg    Intake/Output:   Intake/Output Summary (Last 24 hours) at 02/22/2020 0700 Last data filed at 02/22/2020 0500 Gross per 24 hour  Intake 2980.77 ml  Output 2900 ml  Net 80.77 ml    CVP 3-4  Physical Exam: General:   No resp difficulty HEENT: normal [place]  Neck: supple. no JVD. Carotids 2+ bilat; no bruits. No lymphadenopathy or thryomegaly appreciated. Cor: PMI nondisplaced. Regular rate & rhythm. No rubs, gallops or murmurs. Lungs: clear Abdomen: soft, nontender, nondistended. No hepatosplenomegaly. No bruits or masses. Good bowel sounds. Extremities: no cyanosis, clubbing, rash, edema Neuro: alert & orientedx1, cranial nerves grossly intact. moves all 4 extremities w/o difficulty. LUE weak.  Affect pleasant  Telemetry: SR 90s with occasional PVCs.    Labs: Basic Metabolic Panel: Recent Labs  Lab 02/17/20 0449 02/17/20 1852 02/18/20 0409 02/20/20 0504 02/20/20 0508 02/20/20 1400 02/21/20 0452 02/21/20 0536 02/21/20 1300 02/21/20 1644 02/21/20 2225  02/22/20 0410 02/22/20 0456  NA 134*  --    < > 141   < > 141 141   < > 141 143 143 147* 143  K 4.0  --    < > 3.2*   < > 4.0 3.0*   < > 3.5 2.9* 3.6 2.9* 2.8*  CL 101  --    < > 102  --  103 102  --  103  --  107  --  108  CO2 24  --    < > 24  --  26 24  --  23  --  23  --  24  GLUCOSE 196*  --    < > 128*  --  129* 124*  --  125*  --  185*  --  195*  BUN 22  --    < > 23  --  21 22  --  23  --  26*  --  27*  CREATININE 1.08  --    < > 1.04  --  0.90 1.02  --  1.09  --  1.00  --  0.87  CALCIUM 8.1*  --    < > 8.5*  --  8.4* 8.4*  --  8.4*  --  8.6*  --  8.8*  MG 2.2 1.8  --  2.0  --   --  1.9  --   --   --   --   --  2.2  PHOS 2.3* 2.1*  --  3.8  --   --  3.8  --   --   --   --   --  2.1*   < > = values in this interval not displayed.    Liver Function Tests: Recent Labs  Lab 02/15/20 1114 02/16/20 0323  AST 73* 175*  ALT 54* 53*  ALKPHOS 91 67  BILITOT 0.8 0.6  PROT 6.1* 5.2*  ALBUMIN 3.5 2.9*   No results for input(s): LIPASE, AMYLASE in the last 168 hours. No results for input(s): AMMONIA in the last 168 hours.  CBC: Recent Labs  Lab 02/15/20 1114 02/15/20 1140 02/18/20 0500 02/19/20 0427 02/20/20 0504 02/20/20 0508 02/21/20 0452 02/21/20 0536 02/21/20 1644 02/22/20 0410 02/22/20 0456  WBC 14.2*   < > 8.1 7.5 6.9  --  8.3  --   --   --  9.2  NEUTROABS 7.8*  --   --   --   --   --   --   --   --   --   --   HGB 14.4   < > 9.0* 9.9* 8.9*   < > 9.9* 9.9* 10.5* 10.2* 10.3*  HCT 45.7   < > 28.1* 30.7* 27.7*   < > 30.4* 29.0* 31.0* 30.0* 31.5*  MCV 90.7   < > 90.6 91.4 89.6  --  88.1  --   --   --  88.7  PLT 280   < > 138* 154 172  --  223  --   --   --  271   < > = values in this interval not displayed.    Cardiac Enzymes: No results for input(s): CKTOTAL, CKMB, CKMBINDEX, TROPONINI in the last 168 hours.  BNP: BNP (last 3 results) No results for input(s): BNP in the last 8760 hours.  ProBNP (last 3 results) No results for input(s): PROBNP in the last  8760 hours.    Other results:  Imaging: DG CHEST PORT 1 VIEW  Result Date: 02/21/2020 CLINICAL DATA:  Intubation.  STEMI.  Cardiac arrest. EXAM: PORTABLE CHEST 1 VIEW COMPARISON:  02/20/2020. FINDINGS: Right PICC line noted with tip over lower right atrium, proximal repositioning of approximately 8 cm should be considered. Stable cardiomegaly. Low lung volumes with persistent bibasilar atelectasis/infiltrates. No pleural effusion or pneumothorax. Endotracheal tube and NG tube in stable position. IMPRESSION: 1. Right PICC line noted with tip over lower right atrium, proximal repositioning of approximately 8 cm should be considered. 2. Endotracheal tube and NG tube in stable position. Stable cardiomegaly. 3. Persistent bibasilar atelectasis/infiltrates. These results will be called to the ordering clinician or representative by the Radiologist Assistant, and communication documented in the PACS or [employer]. Electronically Signed   By: Marcello Moores  Register   On: 02/21/2020 06:47   DG Abd Portable 1V  Result Date: 02/21/2020 CLINICAL DATA:  Feeding tube placement. EXAM: PORTABLE ABDOMEN - 1 VIEW COMPARISON:  02/21/2020. FINDINGS: Feeding tube tip noted over the distal portion of the duodenum at the level of the ligament of Treitz. Contrast noted in the duodenum and proximal jejunum. IMPRESSION: Feeding tube tip noted over the distal duodenum at the level of the ligament of Treitz. Electronically Signed   By: Marcello Moores  Register   On: 02/21/2020 12:54   DG Abd Portable 1V  Result Date: 02/21/2020 CLINICAL DATA:  Feeding tube placement. EXAM: PORTABLE ABDOMEN - 1 VIEW COMPARISON:  Chest x-ray 02/21/2020.  Abdomen 02/19/2020. FINDINGS: Feeding tube noted with tip over the left upper quadrant. The feeding tube tip could be within  the stomach or distal duodenum/proximal jejunum. Several loops of slightly prominent small bowel noted. Cecum is distended to approximately 11 cm. Left colon nondistended. No free  air. IMPRESSION: 1. Feeding tube noted with tip over the left upper quadrant. The feeding tube tip could be within the stomach or distal duodenum/proximal jejunum. 2. Several loops of slightly prominent small bowel noted. Cecum is distended to approximately 11 cm. Electronically Signed   By: Marcello Moores  Register   On: 02/21/2020 11:05   VAS [place] LOWER EXTREMITY VENOUS (DVT)  Result Date: 02/21/2020  Lower Venous DVT Study Indications: Edema.  Comparison Study: no prior Performing Technologist: Abram Sander RVS  Examination Guidelines: A complete evaluation includes B-mode imaging, spectral Doppler, color Doppler, and power Doppler as needed of all accessible portions of each vessel. Bilateral testing is considered an integral part of a complete examination. Limited examinations for reoccurring indications may be performed as noted. The reflux portion of the exam is performed with the patient in reverse Trendelenburg.  +---------+---------------+---------+-----------+----------+--------------+ RIGHT    CompressibilityPhasicitySpontaneityPropertiesThrombus Aging +---------+---------------+---------+-----------+----------+--------------+ CFV      Full           Yes      Yes                                 +---------+---------------+---------+-----------+----------+--------------+ SFJ      Full                                                        +---------+---------------+---------+-----------+----------+--------------+ FV Prox  Full                                                        +---------+---------------+---------+-----------+----------+--------------+ FV Mid   Full                                                        +---------+---------------+---------+-----------+----------+--------------+ FV DistalFull                                                        +---------+---------------+---------+-----------+----------+--------------+ PFV      Full                                                         +---------+---------------+---------+-----------+----------+--------------+ POP      Full           Yes      Yes                                 +---------+---------------+---------+-----------+----------+--------------+  PTV      Full                                                        +---------+---------------+---------+-----------+----------+--------------+ PERO     Full                                                        +---------+---------------+---------+-----------+----------+--------------+   +---------+---------------+---------+-----------+----------+--------------+ LEFT     CompressibilityPhasicitySpontaneityPropertiesThrombus Aging +---------+---------------+---------+-----------+----------+--------------+ CFV      Full           Yes      Yes                                 +---------+---------------+---------+-----------+----------+--------------+ SFJ      Full                                                        +---------+---------------+---------+-----------+----------+--------------+ FV Prox  Full                                                        +---------+---------------+---------+-----------+----------+--------------+ FV Mid   Full                                                        +---------+---------------+---------+-----------+----------+--------------+ FV DistalFull                                                        +---------+---------------+---------+-----------+----------+--------------+ PFV      Full                                                        +---------+---------------+---------+-----------+----------+--------------+ POP      Full           Yes      Yes                                 +---------+---------------+---------+-----------+----------+--------------+ PTV      Full                                                         +---------+---------------+---------+-----------+----------+--------------+  PERO     Full                                                        +---------+---------------+---------+-----------+----------+--------------+     Summary: BILATERAL: - No evidence of deep vein thrombosis seen in the lower extremities, bilaterally. - No evidence of superficial venous thrombosis in the lower extremities, bilaterally. -No evidence of popliteal cyst, bilaterally.   *See table(s) above for measurements and observations. Electronically signed by Deitra Mayo MD on 02/21/2020 at 4:07:53 PM.    Final    VAS [place] UPPER EXTREMITY VENOUS DUPLEX  Result Date: 02/20/2020 UPPER VENOUS STUDY  Indications: Swelling, and PICC line. Limitations: Line, bandages and ventilation. Comparison Study: No prior study Performing Technologist: Sharion Dove RVS  Examination Guidelines: A complete evaluation includes B-mode imaging, spectral Doppler, color Doppler, and power Doppler as needed of all accessible portions of each vessel. Bilateral testing is considered an integral part of a complete examination. Limited examinations for reoccurring indications may be performed as noted.  Right Findings: +----------+------------+---------+-----------+----------+--------------+ RIGHT     CompressiblePhasicitySpontaneousProperties   Summary     +----------+------------+---------+-----------+----------+--------------+ IJV                                                 Not visualized +----------+------------+---------+-----------+----------+--------------+ Subclavian               Yes       Yes                             +----------+------------+---------+-----------+----------+--------------+ Axillary                 Yes       Yes                             +----------+------------+---------+-----------+----------+--------------+ Brachial      Full       Yes       Yes                              +----------+------------+---------+-----------+----------+--------------+ Cephalic      None                                                 +----------+------------+---------+-----------+----------+--------------+ Basilic       Full                                                 +----------+------------+---------+-----------+----------+--------------+ Not all segments of the brachial or basilic veins were visualized, secondary to line and bandages. IJV not visualized secondary to ventilator.  Left Findings: +----------+------------+---------+-----------+----------+-------+ LEFT      CompressiblePhasicitySpontaneousPropertiesSummary +----------+------------+---------+-----------+----------+-------+ Subclavian               Yes  Yes                      +----------+------------+---------+-----------+----------+-------+  Summary:  Right: No evidence of deep vein thrombosis in the upper extremity. Findings consistent with acute superficial vein thrombosis involving the right cephalic vein.  Left: No evidence of thrombosis in the subclavian.  *See table(s) above for measurements and observations.  Diagnosing physician: Ruta Hinds MD Electronically signed by Ruta Hinds MD on 02/20/2020 at 5:18:01 PM.    Final      Medications:     Scheduled Medications: . aspirin  81 mg Per Tube Daily  . bisacodyl  10 mg Rectal Daily  . chlorhexidine gluconate (MEDLINE KIT)  15 mL Mouth Rinse BID  . Chlorhexidine Gluconate Cloth  6 each Topical Daily  . clopidogrel  75 mg Per Tube Daily  . feeding supplement (PROSource TF)  90 mL Per Tube BID  . fentaNYL (SUBLIMAZE) injection  50 mcg Intravenous Once  . insulin aspart  0-15 Units Subcutaneous Q4H  . mouth rinse  15 mL Mouth Rinse 10 times per day  . metoCLOPramide (REGLAN) injection  10 mg Intravenous Daily  . neostigmine  0.25 mg Subcutaneous Q6H  . nystatin  5 mL Mouth/Throat QID  . pantoprazole (PROTONIX) IV  40 mg  Intravenous Daily  . polyethylene glycol  17 g Per Tube BID  . potassium chloride  40 mEq Oral Once  . senna-docusate  1 tablet Per Tube BID  . sodium chloride flush  10-40 mL Intracatheter Q12H  . sodium chloride flush  3 mL Intravenous Q12H  . sorbitol  60 mL Per Tube Q0600  . spironolactone  12.5 mg Per Tube Daily    Infusions: . sodium chloride 500 mL (02/21/20 1100)  . ceFEPime (MAXIPIME) IV 2 g (02/22/20 0601)  . dexmedetomidine (PRECEDEX) IV infusion Stopped (02/21/20 0921)  . feeding supplement (VITAL 1.5 CAL) 40 mL/hr at 02/22/20 0100  . fentaNYL infusion INTRAVENOUS Stopped (02/21/20 0728)  . lactated ringers Stopped (02/21/20 1415)  . midazolam 2 mg/hr (02/21/20 2248)  . niCARDipine    . norepinephrine (LEVOPHED) Adult infusion Stopped (02/21/20 0748)  . potassium chloride    . potassium PHOSPHATE IVPB (in mmol)    . vancomycin Stopped (02/22/20 0137)    PRN Medications: sodium chloride, sodium chloride, acetaminophen **OR** acetaminophen (TYLENOL) oral liquid 160 mg/5 mL **OR** acetaminophen, fentaNYL, midazolam, ondansetron (ZOFRAN) IV, sodium chloride flush, sodium chloride flush   Assessment/Plan:   1. Cardiac (VTarrest in setting of acute inferolateral STEMI/VT - OOH cardiac arrest on 1/12 with prolonged CPR, resuscitated w/ CPR + defibrillation   - Emergent cath 1/12 w/ totally occluded mid LCx treated w/ PCI + DES - Recurrent VT on 1/14 -> defib x 1 - Off amiodarone  -Supp K - Mag stable.   2. Acute systolic HF due in setting of acute MI -> cardiogenic shock - EF 25% in cath lab 1/12 - Echo 1/13 EF 50-55% - Impella pulled 1/14 - CVP 3-4. No lasix today. Increase spiro to 25 mg daily.  - CO-OX remains stable.  - Renal function stable.   3. CAD with acute inferolateral  STEMI  - Emergent cath 1/12 w/ totally occluded mid LCx treated w/ PCI + DES - There is moderate ostial LAD disease and significant disease in the distal LAD close to the apex. No  significant disease affecting the right coronary artery. - Continue DAPT. Brilinta switched to Plavix; Plavix therapeutic per P2y12 lab.  -  No b-blocker yet with shock  4. Acute CVA with ICH - left-sided weakness --> now not following commands; repeat CT shows stable hemorrhage - MRI with multiple small infarcts and small ICH (Likely cardioembolic from arrest) - Neuro following - Brilinta switched to Plavix - PT/OT/Speech   5. Acute hypoxic respiratory failure in setting of cardiac arrest - CXR atelectasis, CPT 8.54-->6 -> 3.7; FiO2 50% - Broadened abx d/t fever (Blood cx NGTD, sputum growing similar flora from previous respiratory cultures from 1/14)  - Extubated 02/21/20  6, Ileus - Cor-trac placed 02/21/20 - On daily reglan (dosing limited by QT prolongation) - Has flexi seal   7. AKI - due to ATN -> resolved  8. DM2 - HgbA1c 6.0, -SSI - Add Jardiance prior to d/c  9. Bradycardia -Resolved.   10.  Febrile - 1/16 urine, blood and respiratory cultures sent NGTD - Remains febrile.   11. R Cephalic superficial venous thrombosis - Continue to monitor - Elevate arm. Warm compresses  Consult speech.     Length of Stay: [place] NP 02/22/2020, 7:00 AM  Advanced Heart Failure Team Pager [phone] (M-F; 7a - 4p)  Please contact [facility] for night-coverage after hours (4p -7a ) and weekends on [email or web address]  Patient seen and examined with the above-signed Advanced Practice Provider and/or Housestaff. I personally reviewed laboratory data, imaging studies and relevant notes. I independently examined the patient and formulated the important aspects of the plan. I have edited the note to reflect any of my changes or salient points. I have personally discussed the plan with the patient and/or family.  Extubated yesterday. He is awake and communicative. Off NE. Co-ox 67%. Tmax 102.5 but AF this am. CX negative. Weak on left side  Feels thirsty.   General:  Lying in  bed. No resp difficulty HEENT: normal  + cor-trak Neck: supple. no JVD. Carotids 2+ bilat; no bruits. No lymphadenopathy or thryomegaly appreciated. Cor: PMI nondisplaced. Regular rate & rhythm. No rubs, gallops or murmurs. Lungs: clear Abdomen: soft, nontender, nondistended. No hepatosplenomegaly. No bruits or masses. Good bowel sounds. Extremities: no cyanosis, clubbing, rash, edema Neuro: alert, communicative, cranial nerves grossly intact. Weak on left. Affect pleasant   He is extubated and off pressors. BP stable. Volume status looks good. Remains weak on left side. Working with PT/OT. Has Cor-trak in place. Speech study pending. Ileus improving. Continue to mobilize.   Glori Bickers, MD  8:55 AM

## 2020-02-22 NOTE — Progress Notes (Signed)
Occupational Therapy Treatment Patient Details Name: Jeffrey Morse MRN: [id number] DOB: Jun 27, 1955 Today's Date: 02/22/2020    History of present illness 65 y.o. male hypertension, sleep apnea, hypercholesterolemia, was brought in for evaluation of what started like chest pain on 02/15/2020. Pt attempted to go to urgent care but collapsed when walking in and underwent CPR. Pt found to be in V fib by EMS, CPR of 15 minutes prior to ROSC. Pt went into PEA again in ambulance with further CPR of 5 minutes until ROSC. Pt was intubated and underwent emergent cath with successful stent placement to L circumflex artery and impella placement on 02/15/2020. 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   OT comments  Patient with increased level of alertness and ability to participate.  He has been taken off the vent and is on 4L of O2 via [place].  Patient is demonstrating increased AROM to L leg, L hand and L shoulder.  Spouse educated and shown AAROM to L upper extremity.  He was able to squat pivot to the recliner this date Max A +2.  OT to continue to follow in the acute setting, discharge plan updated to reflect CIR evaluation.  Patient worked full time and owns his own [employer].    Follow Up Recommendations  CIR    Equipment Recommendations  3 in 1 bedside commode;Tub/shower seat;Wheelchair (measurements OT);Wheelchair cushion (measurements OT)    Recommendations for Other Services      Precautions / Restrictions Precautions Precautions: Fall Restrictions Weight Bearing Restrictions: No Other Position/Activity Restrictions: patient with improved AROM to LLE and L shoulder       Mobility Bed Mobility   Bed Mobility: Supine to Sit     Supine to sit: Max assist        Transfers Overall transfer level: Needs assistance   Transfers: Squat Pivot Transfers     Squat pivot transfers: Max assist;+2 physical assistance          Balance   Sitting-balance  support: Single extremity supported Sitting balance-Leahy Scale: Poor       Standing balance-Leahy Scale: Zero Standing balance comment: Isn't quite coming to a full stand                           ADL either performed or assessed with clinical judgement   ADL   Eating/Feeding: NPO   Grooming: Wash/dry hands;Wash/dry face;Maximal assistance;Sitting Grooming Details (indicate cue type and reason): +2 to maintain upright sitting edge of bed Upper Body Bathing: Maximal assistance;Sitting Upper Body Bathing Details (indicate cue type and reason): balance support                         Functional mobility during ADLs: +2 for physical assistance;Maximal assistance General ADL Comments: squat pivot to recliner                       Cognition Arousal/Alertness: Awake/alert Behavior During Therapy: Restless;Impulsive Overall Cognitive Status: Impaired/Different from baseline Area of Impairment: Following commands;Awareness;Safety/judgement;Orientation                 Orientation Level: Person;Place     Following Commands: Follows one step commands inconsistently Safety/Judgement: Decreased awareness of safety;Decreased awareness of deficits Awareness: Emergent            Exercises     Shoulder Instructions       General Comments  VVS on 4 L    Pertinent Vitals/ Pain       Pain Assessment: No/denies pain Pain Intervention(s): Monitored during session                                                          Frequency  Min 2X/week        Progress Toward Goals  OT Goals(current goals can now be found in the care plan section)  Progress towards OT goals: Progressing toward goals  Acute Rehab OT Goals Patient Stated Goal: wants to return to work OT Goal Formulation: With patient Time For Goal Achievement: 03/03/20 Potential to Achieve Goals: Fair  Plan Discharge plan needs to be updated     Co-evaluation    PT/OT/SLP Co-Evaluation/Treatment: Yes Reason for Co-Treatment: Complexity of the patient's impairments (multi-system involvement);For patient/therapist safety   OT goals addressed during session: ADL's and self-care      AM-PAC OT "6 Clicks" Daily Activity     Outcome Measure   Help from another person eating meals?: Total Help from another person taking care of personal grooming?: A Lot Help from another person toileting, which includes using toliet, bedpan, or urinal?: Total Help from another person bathing (including washing, rinsing, drying)?: A Lot Help from another person to put on and taking off regular upper body clothing?: A Lot Help from another person to put on and taking off regular lower body clothing?: Total 6 Click Score: 9    End of Session Equipment Utilized During Treatment: Oxygen;Gait belt  OT Visit Diagnosis: Other symptoms and signs involving the nervous system (R29.898)   Activity Tolerance Patient limited by fatigue   Patient Left in chair;with call bell/phone within reach;with nursing/sitter in room;with family/visitor present   Nurse Communication Mobility status        Time: 1610-9604 OT Time Calculation (min): 35 min  Charges: OT General Charges $OT Visit: 1 Visit OT Treatments $Self Care/Home Management : 8-22 mins  02/22/2020  Rich, OTR/L  Acute Rehabilitation Services  Office:  [phone]    Suzanna Obey 02/22/2020, 9:28 AM

## 2020-02-22 NOTE — Evaluation (Signed)
Clinical/Bedside Swallow Evaluation Patient Details  Name: Jeffrey Morse MRN: [id number] Date of Birth: Jun 13, 1955  Today's Date: 02/22/2020 Time: SLP Start Time (ACUTE ONLY): 1013 SLP Stop Time (ACUTE ONLY): 1039 SLP Time Calculation (min) (ACUTE ONLY): 26 min  Past Medical History:  Past Medical History:  Diagnosis Date   HTN (hypertension)    Hypercholesteremia    Sleep apnea    Past Surgical History:  Past Surgical History:  Procedure Laterality Date   BACK SURGERY     CORONARY STENT INTERVENTION N/A 02/15/2020   Procedure: CORONARY STENT INTERVENTION;  Surgeon: Iran Ouch, MD;  Location: [facility];  Service: Cardiovascular;  Laterality: N/A;  CFX   KNEE SURGERY Left 2012   LEFT HEART CATH AND CORONARY ANGIOGRAPHY N/A 02/15/2020   Procedure: LEFT HEART CATH AND CORONARY ANGIOGRAPHY;  Surgeon: Iran Ouch, MD;  Location: [facility];  Service: Cardiovascular;  Laterality: N/A;   RIGHT HEART CATH N/A 02/15/2020   Procedure: RIGHT HEART CATH;  Surgeon: Iran Ouch, MD;  Location: [facility];  Service: Cardiovascular;  Laterality: N/A;   VENTRICULAR ASSIST DEVICE INSERTION N/A 02/15/2020   Procedure: VENTRICULAR ASSIST DEVICE INSERTION;  Surgeon: Iran Ouch, MD;  Location: [facility];  Service: Cardiovascular;  Laterality: N/A;  iMPELLA    HPI:  Pt is a 65 yo male was brought in for evaluation of what started like chest pain on 02/15/2020. Pt attempted to go to urgent care but collapsed when walking in and underwent CPR. Pt found to be in V fib by EMS, CPR of 15 minutes prior to ROSC. Pt went into PEA again in ambulance with further CPR of 5 minutes until ROSC. Pt was intubated and underwent emergent cath with successful stent placement to L circumflex artery and impella placement on 02/15/2020. Impella removed on 02/17/2020, pt found to have multiple small infarcts in the bilateral frontal lobes and small thalamic  hemorrhage. ETT 1/12-1/18. PMH includes: sleep apnea, hypercholesteremia, HTN, hiatal hernia, GER, esophageal stretching in December 2021   Assessment / Plan / Recommendation Clinical Impression  Pt presents with strong vocal quality s/p extubation on previous date, although with speech mildly slurred. He needs cueing throughout PO intake for bolus, partially because he keeps his eyes closed but also secondary to altered mentation. Otherwise he does not have any signs of mechanical dysphagia, and he has no overt s/s of aspiration until after PO trials are stopped. He had a delayed but prolonged cough response, although family does report that he has had some coughing episodes like this earlier today in the absence of POs. His wife also shares that he has a hiatal hernia that causes him to cough occasionally after eating, but not usually so prolonged. Recommend starting Dys 2 (chopped) diet and thin liquids with assist during meals to regulate pacing and monitor for any other overt signs of difficulty. Will f/u for tolerance vs potential need for MBS. MD may also want to consider ordering SLP cognitive-linguistic evaluation in the setting of acute stroke. SLP Visit Diagnosis: Dysphagia, unspecified (R13.10)    Aspiration Risk  Mild aspiration risk    Diet Recommendation Dysphagia 2 (Fine chop);Thin liquid   Liquid Administration via: Cup;Straw Medication Administration: Whole meds with puree Supervision: Staff to assist with self feeding;Full supervision/cueing for compensatory strategies Compensations: Minimize environmental distractions;Slow rate;Small sips/bites Postural Changes: Seated upright at 90 degrees;Remain upright for at least 30 minutes after po intake    Other  Recommendations Oral Care Recommendations: Oral care BID   Follow up Recommendations Inpatient Rehab      Frequency and Duration min 2x/week  2 weeks       Prognosis Prognosis for Safe Diet Advancement: Good Barriers  to Reach Goals: Cognitive deficits      Swallow Study   General HPI: Pt is a 65 yo male was brought in for evaluation of what started like chest pain on 02/15/2020. Pt attempted to go to urgent care but collapsed when walking in and underwent CPR. Pt found to be in V fib by EMS, CPR of 15 minutes prior to ROSC. Pt went into PEA again in ambulance with further CPR of 5 minutes until ROSC. Pt was intubated and underwent emergent cath with successful stent placement to L circumflex artery and impella placement on 02/15/2020. Impella removed on 02/17/2020, pt found to have multiple small infarcts in the bilateral frontal lobes and small thalamic hemorrhage. ETT 1/12-1/18. PMH includes: sleep apnea, hypercholesteremia, HTN, hiatal hernia, GER, esophageal stretching in December 2021 Type of Study: Bedside Swallow Evaluation Previous Swallow Assessment: none in chart Diet Prior to this Study: NPO;NG Tube Temperature Spikes Noted: Yes (102.5) Respiratory Status: Nasal cannula History of Recent Intubation: Yes Length of Intubations (days): 6 days Date extubated: 02/21/20 Behavior/Cognition: Alert;Cooperative;Requires cueing Oral Cavity Assessment: Dry Oral Care Completed by SLP: Recent completion by staff Oral Cavity - Dentition: Adequate natural dentition Vision:  (keeps eyes closed) Self-Feeding Abilities: Total assist Patient Positioning: Upright in chair Baseline Vocal Quality: Normal Volitional Cough: Weak Volitional Swallow: Able to elicit    Oral/Motor/Sensory Function Overall Oral Motor/Sensory Function: Generalized oral weakness   Ice Chips Ice chips: Within functional limits Presentation: Spoon   Thin Liquid Thin Liquid: Impaired Presentation: Cup;Self Fed;Straw Pharyngeal  Phase Impairments: Cough - Delayed    Nectar Thick Nectar Thick Liquid: Not tested   Honey Thick Honey Thick Liquid: Not tested   Puree Puree: Impaired Presentation: Spoon Oral Phase Impairments: Poor awareness  of bolus   Solid     Solid: Impaired Oral Phase Impairments: Poor awareness of bolus Pharyngeal Phase Impairments: Cough - Delayed      Mahala Menghini., M.A. CCC-SLP Acute Rehabilitation Services Pager [phone] Office [phone]  02/22/2020,12:23 PM

## 2020-02-22 NOTE — Evaluation (Signed)
Physical Therapy Re- Evaluation Patient Details Name: Jeffrey Morse MRN: [id number] DOB: 1955-12-28 Today's Date: 02/22/2020   History of Present Illness  65 y.o. male hypertension, sleep apnea, hypercholesterolemia, was brought in for evaluation of what started like chest pain on 02/15/2020. Pt attempted to go to urgent care but collapsed when walking in and underwent CPR. Pt found to be in V fib by EMS, CPR of 15 minutes prior to ROSC. Pt went into PEA again in ambulance with further CPR of 5 minutes until ROSC. Pt was intubated and underwent emergent cath with successful stent placement to L circumflex artery and impella placement on 02/15/2020. 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  Pt was intubated 1/12- 1/18.  PT evaluated pt 1/15 and was total assist +2  but then signed off on 1/17 as pt was sedated and intubated and nurse asked PT to sign off. Reorder for PT on 1/19.  Clinical Impression  Pt admitted with above diagnosis. Pt was able to participate today with therapy and much more alert than previous session. Pt overall max assist of 2 for all aspects of mobility. Feel that pt is a great Rehab candidate and has supportive wife. Will follow acutely.  Pt currently with functional limitations due to the deficits listed below (see PT Problem List). Pt will benefit from skilled PT to increase their independence and safety with mobility to allow discharge to the venue listed below.      Follow Up Recommendations CIR;Supervision/Assistance - 24 hour    Equipment Recommendations  Wheelchair (measurements PT);Wheelchair cushion (measurements PT);Hospital bed;Other (comment)    Recommendations for Other Services       Precautions / Restrictions Precautions Precautions: Fall Restrictions Weight Bearing Restrictions: No Other Position/Activity Restrictions: patient with improved AROM to LLE and L shoulder      Mobility  Bed Mobility Overal bed mobility: Needs  Assistance Bed Mobility: Supine to Sit     Supine to sit: Max assist;+2 for physical assistance;HOB elevated     General bed mobility comments: Needed assist for LES and elevation of trunk with use of pad to scoot pt to EOB.  Pt flexed posture and head flexed as well with rounded shoulders in sitting.    Transfers Overall transfer level: Needs assistance Equipment used: 2 person hand held assist Transfers: Squat Pivot Transfers     Squat pivot transfers: Max assist;+2 physical assistance     General transfer comment: Pt needed max of 2 persons to squat and pivot to chair with use of pad.  Ambulation/Gait                Stairs            Wheelchair Mobility    Modified Rankin (Stroke Patients Only) Modified Rankin (Stroke Patients Only) Pre-Morbid Rankin Score: No symptoms Modified Rankin: Severe disability     Balance Overall balance assessment: Needs assistance Sitting-balance support: Single extremity supported;Feet supported Sitting balance-Leahy Scale: Poor Sitting balance - Comments: Pt needed mod to min gaurd assist for sitting.  Pt progressed to min guard for brief seconds at a time. Postural control: Posterior lean Standing balance support: Bilateral upper extremity supported;During functional activity Standing balance-Leahy Scale: Zero Standing balance comment: Isn't quite coming to a full stand                             Pertinent Vitals/Pain Pain Assessment: No/denies pain Faces Pain Scale:  No hurt Pain Intervention(s): Monitored during session    Home Living Family/patient expects to be discharged to:: Private residence Living Arrangements: Spouse/significant other Available Help at Discharge: Family Type of Home: Mobile home Home Access: Stairs to enter Entrance Stairs-Rails: Can reach both Entrance Stairs-Number of Steps: 3 Home Layout: One level Home Equipment: None      Prior Function Level of Independence:  Independent         Comments: independent, works in Hospital doctor, working the day prior to MI     [employer]   Dominant Hand: Right    Extremity/Trunk Assessment   Upper Extremity Assessment Upper Extremity Assessment: Defer to OT evaluation    Lower Extremity Assessment RLE Deficits / Details: pt wiggles toes of RLE and A/AROM hip and knee LLE Deficits / Details: pt wiggles toes of RLE and A/AROM hip and knee    Cervical / Trunk Assessment Cervical / Trunk Assessment: Kyphotic  Communication   Communication: Other (comment)  Cognition Arousal/Alertness: Awake/alert Behavior During Therapy: Restless;Impulsive Overall Cognitive Status: Impaired/Different from baseline Area of Impairment: Following commands;Awareness;Safety/judgement;Orientation                 Orientation Level: Person;Place     Following Commands: Follows one step commands with increased time Safety/Judgement: Decreased awareness of safety;Decreased awareness of deficits Awareness: Emergent   General Comments: Pt more alert and interactive today.      General Comments General comments (skin integrity, edema, etc.): VSS    Exercises General Exercises - Lower Extremity Heel Slides: AAROM;Both;5 reps;Supine   Assessment/Plan    PT Assessment Patient needs continued PT services  PT Problem List Decreased strength;Decreased activity tolerance;Decreased balance;Decreased mobility;Decreased cognition;Cardiopulmonary status limiting activity       PT Treatment Interventions DME instruction;Gait training;Functional mobility training;Stair training;Therapeutic activities;Therapeutic exercise;Balance training;Neuromuscular re-education;Cognitive remediation;Patient/family education;Wheelchair mobility training    PT Goals (Current goals can be found in the Care Plan section)  Acute Rehab PT Goals Patient Stated Goal: wants to return to work PT Goal Formulation: Patient unable to participate in  goal setting Time For Goal Achievement: 03/07/20 Potential to Achieve Goals: Fair    Frequency Min 4X/week   Barriers to discharge        Co-evaluation PT/OT/SLP Co-Evaluation/Treatment: Yes Reason for Co-Treatment: Complexity of the patient's impairments (multi-system involvement);For patient/therapist safety PT goals addressed during session: Mobility/safety with mobility OT goals addressed during session: ADL's and self-care       AM-PAC PT "6 Clicks" Mobility  Outcome Measure Help needed turning from your back to your side while in a flat bed without using bedrails?: A Lot Help needed moving from lying on your back to sitting on the side of a flat bed without using bedrails?: A Lot Help needed moving to and from a bed to a chair (including a wheelchair)?: Total Help needed standing up from a chair using your arms (e.g., wheelchair or bedside chair)?: Total Help needed to walk in hospital room?: Total Help needed climbing 3-5 steps with a railing? : Total 6 Click Score: 8    End of Session Equipment Utilized During Treatment: Oxygen;Gait belt Activity Tolerance: Patient limited by fatigue Patient left: with call bell/phone within reach;in chair;with family/visitor present Nurse Communication: Mobility status;Need for lift equipment PT Visit Diagnosis: Other abnormalities of gait and mobility (R26.89);Other symptoms and signs involving the nervous system (R29.898)    Time: 1601-0932 PT Time Calculation (min) (ACUTE ONLY): 16 min   Charges:   PT Evaluation $PT Re-evaluation: 1 Re-eval  Marcellous Snarski W,PT Acute Rehabilitation Services Pager:  [phone]  Office:  [phone]    Berline Lopes 02/22/2020, 11:23 AM

## 2020-02-22 NOTE — Progress Notes (Signed)
Pt I&O cath per protocol for bladder scan greater than 500 ml. Sterile technique used, 925 ml retrieved from bladder. Nicholaus Corolla RN assisted. Pt tolerated well.

## 2020-02-22 NOTE — Progress Notes (Signed)
Pharmacy Electrolyte Replacement  Recent Labs:  Recent Labs    02/22/20 0456 02/22/20 1556 02/22/20 1557  K 2.8*   < > 3.3*  MG 2.2  --   --   PHOS 2.1*  --   --   CREATININE 0.87  --  0.83   < > = values in this interval not displayed.    Low Critical Values (K </= 2.5, Phos </= 1, Mg </= 1) Present: None  MD Contacted: n/a - no critical values noted  Plan: KCl PO x 1 + K runs x 2 per protocol Recheck labs in AM   Leia Alf, PharmD, BCPS Please check AMION for all [facility] contact numbers Clinical Pharmacist 02/22/2020 5:28 PM

## 2020-02-22 NOTE — Progress Notes (Signed)
K 2.8, Phos 2.1 Electrolytes replaced per protocol

## 2020-02-22 NOTE — Progress Notes (Signed)
NAME:  Jeffrey Morse, MRN:  [id number], DOB:  1955/12/27, LOS: 7 ADMISSION DATE:  02/15/2020, CONSULTATION DATE:  02/15/20 REFERRING MD:  Jeffrey Morse  CHIEF COMPLAINT:  VF arrest   Brief History   Jeffrey Morse is a 65 y.o. male who was brought to [facility] ED 1/12 with VT arrest after he collapsed at [facility] (had gone there for chest pain).  He had multiple recurrent arrests before being taken to cath lab where he was found to have severe LV dysfunction with EF 25-35%, and 100% prox Cx stenosis.  He had DES and Impella placed and was then transferred to the ICU where PCCM was asked to assist with vent management.  History of present illness   Pt is encephelopathic; therefore, this HPI is obtained from chart review. Jeffrey Morse is a 65 y.o. male who has unknown PMH.  He was going to [facility] 1/12 for chest pain and before he got inside, he collapsed and went unresponsive.  Family was with him and began CPR while awaiting EMS.  On EMS arrival, he was found to have VT.  He was shocked once and had ROSC; however, he proceeded to have multiple recurrent arrests.  He was brought to [facility] and taken straight to cath lab where he was found to have severe LV dysfunction EF 25 - 35%, Ost LAD 40% stenosed, Dist LAD 90% stenosed, 1st Marg lesion 90% stenosed, Ost Cx 40% stenosed, Prox Cx 100% stenosed.   He had DES and Impella placed and was transferred to the ICU where PCCM was asked to assist with vent management.  He is currently on 29mcg/min levophed.  Past Medical History  Unknown. has Cardiogenic shock (HCC); Cardiac arrest [facility]); ST elevation myocardial infarction (STEMI) (HCC); Encounter for imaging study to confirm orogastric (OG) tube placement; Acute respiratory failure with hypoxia (HCC); Cerebral thrombosis with cerebral infarction; and Subarachnoid hemorrhage on their problem list.  Significant Hospital Events   1/12 > admit. 1/14 > brief (2 min) VT arrest 1/14 > not moving LUE, multiple small infarcts  found 1/16 > stable head CT, issues with ileus, spiked fever despite ceftriaxone- broadened to vanc/cefepime 1/17 > RUE swelling, duplex ordered, ongoing ileus 1/18 > R cephalic vein clot, NTD, SAT/SBT  Consults:  Cards, PCCM, neuro  Procedures:  ETT 1/12 >  Impella 1/12 >  Significant Diagnostic Tests:  Cath lab 1/12 > Severe LV dysfunction EF 25 - 35%, Ost LAD 40% stenosed, Dist LAD 90% stenosed, 1st Marg lesion 90% stenosed, Ost Cx 40% stenosed, Prox Cx 100% stenosed.  DES placed, Impella placed. Echo 1/13 >  1. Left ventricular ejection fraction, by estimation, is 50 to 55%. The  left ventricle has mildly decreased function. The left ventricle  demonstrates regional wall motion abnormalities. Posterior wall  hypokinesis.  2. Impella measures about 3cm into LV cavity from aortic valve  3. Right ventricular systolic function is normal. The right ventricular  size is normal.   MRI Brain 1/14  IMPRESSION: 1. Multiple small acute infarcts within the frontal lobes. 2. New right thalamic microhemorrhage, since 01/18/12. 3. Normal intracranial MRA.  Micro Data:  Flu 1/12 > negative. COVID 1/12 > negative. See micro tab  Antimicrobials:  See fever tab  Interim history/subjective:  Looks great. Awake following commands, able to communicate. Wants to eat. LUE weakness and neglect persists.  Objective:  Blood pressure 132/77, pulse 88, temperature 98.1 F (36.7 C), temperature source Oral, resp. rate 18, height 5\' 10"  (1.778 m), weight  94.5 kg, SpO2 98 %. CVP:  [2 mmHg-9 mmHg] 5 mmHg      Intake/Output Summary (Last 24 hours) at 02/22/2020 0900 Last data filed at 02/22/2020 0700 Gross per 24 hour  Intake 2927.73 ml  Output 3975 ml  Net -1047.27 ml   Filed Weights   02/20/20 0540 02/21/20 0600 02/22/20 0400  Weight: 99.7 kg 96.4 kg 94.5 kg    Examination: Constitutional: no acute distress  Eyes: EOMI, pupils equal Ears, nose, mouth, and throat: MMM, trachea  midline, cortrak in place Cardiovascular: RRR, ext warm Respiratory: Scattered rhonci, no accessory muscle use Gastrointestinal: Soft, +BS Skin: No rashes, normal turgor Neurologic: moves RUE and RLE briskly.  Moves LLE with enough prompting.  LUE neglect and weakness persists. Psychiatric: RASS 0   Sugars a bit low SvO2 67% Cr improved CBC stable  Assessment & Plan:  Acute hypoxic resp failure post cardiac arrest- CXR now clear OOH Vtach cardiac arrest- suspect related to prox L circ occlusion post DES Cardiogenic shock after arrest- post impella, improving, CHF team following, impella now out and SvO2 down, lingering mild pressor requirement likely from sedation HCAP vs. Aspiration PNA- still febrile despite broad spectrum abx, question central IH Vtach cardiac arrest- brief, 1/14,  Thought due to ischemic QT prolongation and hypokalemia Embolic strokes, thalamic with hemorrhagic conversion- small, hopefully will have good return of function; stable on imaging 1/16 Ileus vs. Gastroparesis- improved Hypokalemia  RUE cephalic vein thrombus Persistent fevers: question related to RUE thrombus vs. PNA; it did resolve after precedex stopped so could call it drug fever.  LE duplex neg. Improved.  - continue cefepime x 7 days - antiplatelets, GDT, AC, antiarrythmics, diuretics per CHF team and neurology discussion - continue TF, SLP/PT/OT eval - SSI - encourage IS - he looks great! - PCCM available PRN  Myrla Halsted MD PCCM

## 2020-02-22 NOTE — Progress Notes (Signed)
Patient with consistent low K, which was replaced this morning. RN collected bmet off picc line. Lab called RN to notify of K >7. RN obtained eg7 to verify levels (seen below) and resent bmet. Awaiting results.    Results for WELDEN, HAUSMANN (MRN [id number]) as of 02/22/2020 16:25  Ref. Range 02/22/2020 15:56  Sample type Unknown VENOUS  pH, Ven Latest Ref Range: 7.250 - 7.430  7.445 (H)  pCO2, Ven Latest Ref Range: 44.0 - 60.0 mmHg 35.0 (L)  pO2, Ven Latest Ref Range: 32.0 - 45.0 mmHg 25.0 (LL)  TCO2 Latest Ref Range: 22 - 32 mmol/L 25  Acid-Base Excess Latest Ref Range: 0.0 - 2.0 mmol/L 0.0  Bicarbonate Latest Ref Range: 20.0 - 28.0 mmol/L 24.0  O2 Saturation Latest Units: % 49.0  Patient temperature Unknown 99.1 F  Sodium Latest Ref Range: 135 - 145 mmol/L 150 (H)  Potassium Latest Ref Range: 3.5 - 5.1 mmol/L 3.3 (L)  Calcium Ionized Latest Ref Range: 1.15 - 1.40 mmol/L 1.20  Hemoglobin Latest Ref Range: 13.0 - 17.0 g/dL 9.9 (L)  HCT Latest Ref Range: 39.0 - 52.0 % 29.0 (L)

## 2020-02-22 NOTE — Progress Notes (Signed)
Rehab Admissions Coordinator Note:  Patient was screened by Clois Dupes for appropriateness for an Inpatient Acute Rehab Consult per therapy recommendations.  At this time, we are recommending Inpatient Rehab consult. Please place order if you would like  patient considered for admit. Please advise.  Clois Dupes RN MSN 02/22/2020, 12:09 PM  I can be reached at [phone].

## 2020-02-22 NOTE — Plan of Care (Signed)
°  Problem: Activity: Goal: Ability to return to baseline activity level will improve Outcome: Progressing   Problem: Cardiovascular: Goal: Ability to achieve and maintain adequate cardiovascular perfusion will improve Outcome: Progressing Goal: Vascular access site(s) Level 0-1 will be maintained Outcome: Progressing   Problem: Clinical Measurements: Goal: Ability to maintain clinical measurements within normal limits will improve Outcome: Progressing Goal: Respiratory complications will improve Outcome: Progressing   Problem: Activity: Goal: Risk for activity intolerance will decrease Outcome: Progressing   Problem: Nutrition: Goal: Adequate nutrition will be maintained Outcome: Progressing   Problem: Elimination: Goal: Will not experience complications related to bowel motility Outcome: Progressing   Problem: Pain Managment: Goal: General experience of comfort will improve Outcome: Progressing   Problem: Elimination: Goal: Will not experience complications related to urinary retention Outcome: Not Progressing

## 2020-02-23 DIAGNOSIS — I619 Nontraumatic intracerebral hemorrhage, unspecified: Secondary | ICD-10-CM

## 2020-02-23 DIAGNOSIS — I6330 Cerebral infarction due to thrombosis of unspecified cerebral artery: Secondary | ICD-10-CM | POA: Diagnosis not present

## 2020-02-23 LAB — MAGNESIUM: Magnesium: 2 mg/dL (ref 1.7–2.4)

## 2020-02-23 LAB — CBC
HCT: 26 % — ABNORMAL LOW (ref 39.0–52.0)
Hemoglobin: 8.7 g/dL — ABNORMAL LOW (ref 13.0–17.0)
MCH: 29.7 pg (ref 26.0–34.0)
MCHC: 33.5 g/dL (ref 30.0–36.0)
MCV: 88.7 fL (ref 80.0–100.0)
Platelets: 284 10*3/uL (ref 150–400)
RBC: 2.93 MIL/uL — ABNORMAL LOW (ref 4.22–5.81)
RDW: 14.9 % (ref 11.5–15.5)
WBC: 9.8 10*3/uL (ref 4.0–10.5)
nRBC: 0 % (ref 0.0–0.2)

## 2020-02-23 LAB — BASIC METABOLIC PANEL
Anion gap: 10 (ref 5–15)
BUN: 25 mg/dL — ABNORMAL HIGH (ref 8–23)
CO2: 23 mmol/L (ref 22–32)
Calcium: 8.1 mg/dL — ABNORMAL LOW (ref 8.9–10.3)
Chloride: 109 mmol/L (ref 98–111)
Creatinine, Ser: 0.81 mg/dL (ref 0.61–1.24)
GFR, Estimated: >60 mL/min (ref >60)
Glucose, Bld: 230 mg/dL — ABNORMAL HIGH (ref 70–99)
Potassium: 3.6 mmol/L (ref 3.5–5.1)
Sodium: 142 mmol/L (ref 135–145)

## 2020-02-23 LAB — GLUCOSE, CAPILLARY
Glucose-Capillary: 242 mg/dL — ABNORMAL HIGH (ref 70–99)
Glucose-Capillary: 230 mg/dL — ABNORMAL HIGH (ref 70–99)
Glucose-Capillary: 207 mg/dL — ABNORMAL HIGH (ref 70–99)
Glucose-Capillary: 223 mg/dL — ABNORMAL HIGH (ref 70–99)
Glucose-Capillary: 215 mg/dL — ABNORMAL HIGH (ref 70–99)
Glucose-Capillary: 266 mg/dL — ABNORMAL HIGH (ref 70–99)

## 2020-02-23 LAB — COOXEMETRY PANEL
Carboxyhemoglobin: 1.4 % (ref 0.5–1.5)
Methemoglobin: 0.9 % (ref 0.0–1.5)
O2 Saturation: 71.7 %
Total hemoglobin: 9.5 g/dL — ABNORMAL LOW (ref 12.0–16.0)

## 2020-02-23 LAB — PHOSPHORUS: Phosphorus: 2.7 mg/dL (ref 2.5–4.6)

## 2020-02-23 MED ORDER — NUTRISOURCE FIBER PO PACK
1.0000 | PACK | Freq: Two times a day (BID) | ORAL | Status: DC
  Administered 2020-02-23 – 2020-02-28 (×10): 1
  Filled 2020-02-23 (×12): qty 1

## 2020-02-23 MED ORDER — ORAL CARE MOUTH RINSE
15.0000 mL | Freq: Two times a day (BID) | OROMUCOSAL | Status: DC
  Administered 2020-02-23 – 2020-02-28 (×11): 15 mL via OROMUCOSAL

## 2020-02-23 MED ORDER — INSULIN ASPART 100 UNIT/ML ~~LOC~~ SOLN
0.0000 [IU] | Freq: Three times a day (TID) | SUBCUTANEOUS | Status: DC
  Administered 2020-02-23: 8 [IU] via SUBCUTANEOUS
  Administered 2020-02-23 – 2020-02-24 (×2): 5 [IU] via SUBCUTANEOUS
  Administered 2020-02-24: 3 [IU] via SUBCUTANEOUS
  Administered 2020-02-24 – 2020-02-25 (×3): 5 [IU] via SUBCUTANEOUS
  Administered 2020-02-25: 3 [IU] via SUBCUTANEOUS
  Administered 2020-02-25: 5 [IU] via SUBCUTANEOUS
  Administered 2020-02-25 – 2020-02-26 (×4): 8 [IU] via SUBCUTANEOUS
  Administered 2020-02-26: 3 [IU] via SUBCUTANEOUS
  Administered 2020-02-27: 8 [IU] via SUBCUTANEOUS
  Administered 2020-02-27: 2 [IU] via SUBCUTANEOUS
  Administered 2020-02-27: 3 [IU] via SUBCUTANEOUS
  Administered 2020-02-27: 8 [IU] via SUBCUTANEOUS
  Administered 2020-02-28: 5 [IU] via SUBCUTANEOUS
  Administered 2020-02-28: 2 [IU] via SUBCUTANEOUS

## 2020-02-23 MED ORDER — MELATONIN 3 MG PO TABS
3.0000 mg | ORAL_TABLET | Freq: Every evening | ORAL | Status: DC | PRN
  Administered 2020-02-23 – 2020-02-26 (×3): 3 mg
  Filled 2020-02-23 (×4): qty 1

## 2020-02-23 MED ORDER — PROSOURCE TF PO LIQD
45.0000 mL | Freq: Four times a day (QID) | ORAL | Status: DC
  Administered 2020-02-23 – 2020-02-28 (×20): 45 mL
  Filled 2020-02-23 (×20): qty 45

## 2020-02-23 MED ORDER — POTASSIUM CHLORIDE 20 MEQ PO PACK
40.0000 meq | PACK | Freq: Once | ORAL | Status: AC
  Administered 2020-02-23: 40 meq
  Filled 2020-02-23: qty 2

## 2020-02-23 MED ORDER — OSMOLITE 1.5 CAL PO LIQD
1000.0000 mL | ORAL | Status: DC
  Administered 2020-02-24 – 2020-02-27 (×4): 1000 mL
  Filled 2020-02-23: qty 1000

## 2020-02-23 MED ORDER — OSMOLITE 1.5 CAL PO LIQD: 1000.0000 mL | ORAL | Status: DC

## 2020-02-23 MED ORDER — ENSURE ENLIVE PO LIQD
237.0000 mL | Freq: Two times a day (BID) | ORAL | Status: DC
  Administered 2020-02-24 – 2020-02-25 (×4): 237 mL via ORAL

## 2020-02-23 MED ORDER — FUROSEMIDE 40 MG PO TABS
40.0000 mg | ORAL_TABLET | Freq: Every day | ORAL | Status: DC
  Administered 2020-02-23: 40 mg via ORAL
  Filled 2020-02-23: qty 1

## 2020-02-23 MED ORDER — SACUBITRIL-VALSARTAN 24-26 MG PO TABS
1.0000 | ORAL_TABLET | Freq: Two times a day (BID) | ORAL | Status: DC
  Administered 2020-02-23 – 2020-02-25 (×5): 1 via ORAL
  Filled 2020-02-23 (×6): qty 1

## 2020-02-23 MED ORDER — POTASSIUM CHLORIDE CRYS ER 20 MEQ PO TBCR
40.0000 meq | EXTENDED_RELEASE_TABLET | Freq: Two times a day (BID) | ORAL | Status: DC
  Administered 2020-02-23 – 2020-02-24 (×3): 40 meq via ORAL
  Filled 2020-02-23 (×4): qty 2

## 2020-02-23 NOTE — Progress Notes (Signed)
Inpatient Rehabilitation Admissions Coordinator   I met at bedside with patient and his son, Jeffrey Morse, for rehab assessment. Patient quite confused and not easily redirected. I will follow up with his wife by phone to further assess rehab venue options.  Danne Baxter, RN, MSN Rehab Admissions Coordinator [phone] 02/23/2020 12:06 PM

## 2020-02-23 NOTE — Progress Notes (Signed)
  Speech Language Pathology Treatment: Dysphagia  Patient Details Name: Jeffrey Morse MRN: [id number] DOB: [birth year] Today's Date: 02/23/2020 Time: 0951-1008 SLP Time Calculation (min) (ACUTE ONLY): 17 min  Assessment / Plan / Recommendation Clinical Impression  Pt has no overt coughing during PO intake this morning, but is also limited in what he will eat/drink because he is too "full" (note that he still has TFs running). He appears to have a mild increase in RR after intake and needs frequent cues for redirection to task. Mild anterior loss was noted with purees and when given a single bite of chopped foods, he orally held it until it all either fell from his lips or was expectorated out. Note that he did not like this food at all, but he also wouldn't eat any other solids to show that he could tolerate any type of diet advancement. Will leave current orders in place. Please consider ordering cognitive evaluation as well.    HPI HPI: Pt is a 65 yo male was brought in for evaluation of what started like chest pain on 02/15/2020. Pt attempted to go to urgent care but collapsed when walking in and underwent CPR. Pt found to be in V fib by EMS, CPR of 15 minutes prior to ROSC. Pt went into PEA again in ambulance with further CPR of 5 minutes until ROSC. Pt was intubated and underwent emergent cath with successful stent placement to L circumflex artery and impella placement on 02/15/2020. Impella removed on 02/17/2020, pt found to have multiple small infarcts in the bilateral frontal lobes and small thalamic hemorrhage. ETT 1/12-1/18. PMH includes: sleep apnea, hypercholesteremia, HTN, hiatal hernia, GER, esophageal stretching in December 2021      SLP Plan  Continue with current plan of care       Recommendations  Diet recommendations: Dysphagia 2 (fine chop);Thin liquid Liquids provided via: Cup;Straw Medication Administration: Whole meds with puree Supervision: Staff to assist with self  feeding;Full supervision/cueing for compensatory strategies Compensations: Minimize environmental distractions;Slow rate;Small sips/bites Postural Changes and/or Swallow Maneuvers: Seated upright 90 degrees;Upright 30-60 min after meal                Oral Care Recommendations: Oral care BID Follow up Recommendations: Inpatient Rehab SLP Visit Diagnosis: Dysphagia, unspecified (R13.10) Plan: Continue with current plan of care       GO                Jeffrey Morse., M.A. CCC-SLP Acute Rehabilitation Services Pager [phone] Office [phone]  02/23/2020, 11:43 AM

## 2020-02-23 NOTE — Progress Notes (Signed)
eLink Physician-Brief Progress Note Patient Name: Jeffrey Morse DOB: [birth year] MRN: [id number]   Date of Service  02/23/2020  HPI/Events of Note  Insomnia.  eICU Interventions  Melatonin ordered Q HS PRN insomnia.        Jeffrey Morse 02/23/2020, 12:41 AM

## 2020-02-23 NOTE — Progress Notes (Addendum)
STROKE TEAM PROGRESS NOTE     INTERVAL HISTORY No acute overnight events.  Patient evaluated at bedside this morning, with son present in the room.  Patient's sister was on the phone as well.  This morning patient is alert, awake and oriented to time place and person.  He denies any new complaints.  He states his grand mother passed away recently and would like to go home for her funeral this weekend.  He is explained about his current delicate situation and that he might require further hospitalization and he shows good understanding about it.  He follows commands and able to move his right upper and lower extremities but still remains weak on his left extremities.  PT recommended CIR.  Remains febrile, blood pressure well controlled.  Neurological exam is stable.  From stroke team point of view, patient is stable and we will sign off.  Vitals:   02/23/20 1200 02/23/20 1300 02/23/20 1400 02/23/20 1500  BP: (!) 150/89 (!) 145/76 124/65 137/85  Pulse: 86 87 85 86  Resp: (!) 23 (!) 27 (!) 21 15  Temp:      TempSrc:      SpO2: 97% 96% 98% 99%  Weight:      Height:       CBC:  Recent Labs  Lab 02/22/20 0456 02/22/20 1556 02/23/20 0355  WBC 9.2  --  9.8  HGB 10.3* 9.9* 8.7*  HCT 31.5* 29.0* 26.0*  MCV 88.7  --  88.7  PLT 271  --  284   Basic Metabolic Panel:  Recent Labs  Lab 02/22/20 0456 02/22/20 1556 02/22/20 1557 02/23/20 0355  NA 143   < > 146* 142  K 2.8*   < > 3.3* 3.6  CL 108  --  112* 109  CO2 24  --  23 23  GLUCOSE 195*  --  196* 230*  BUN 27*  --  27* 25*  CREATININE 0.87  --  0.83 0.81  CALCIUM 8.8*  --  8.5* 8.1*  MG 2.2  --   --  2.0  PHOS 2.1*  --   --  2.7   < > = values in this interval not displayed.   Lipid Panel:  Recent Labs  Lab 02/18/20 1032  CHOL 133  TRIG 97  HDL 38*  CHOLHDL 3.5  VLDL 19  LDLCALC 76   HgbA1c:  Recent Labs  Lab 02/18/20 0500  HGBA1C 6.1*    IMAGING past 24 hours  VAS [place] Lower extremity 02/21/2020  Summary:   BILATERAL:  - No evidence of deep vein thrombosis seen in the lower extremities,  bilaterally.  - No evidence of superficial venous thrombosis in the lower extremities,  bilaterally.  -No evidence of popliteal cyst, bilaterally.     CT Head wo contrast 02/20/2019  IMPRESSION: 1. Unchanged punctate focus of blood at the right thalamus. 2. Faint hypoattenuation in the frontal lobes correspond to known recent infarcts.  CT Head Wo Contrast 02/17/2020 IMPRESSION:  1. Subcentimeter hyperdense focus within the right thalamic region, suspicious for a small acute bleed. MRI correlation is recommended.  2. Mild cerebral atrophy.  3. Moderate to marked severity paranasal sinus disease.  MR BRAIN WO CONTRAST MR ANGIO HEAD WO CONTRAST 02/17/2020 IMPRESSION:  1. Multiple small acute infarcts within the frontal lobes.  2. New right thalamic microhemorrhage, since 01/18/12.  3. Normal intracranial MRA.   DG CHEST PORT 1 VIEW 02/16/2020 IMPRESSION:  Low lung volumes persist, with likely atelectasis at  the lung bases and no overt edema or confluent airspace disease. Unchanged position of endotracheal tube, gastric tube, right IJ sheath/Swan-Ganz.   DG Abd Portable 1V 02/17/2020 IMPRESSION:  Enteric tube in satisfactory position, nonobstructive bowel gas pattern.   ECHOCARDIOGRAM LIMITED 02/16/2020 IMPRESSIONS   1. Left ventricular ejection fraction, by estimation, is 50 to 55%. The left ventricle has mildly decreased function. The left ventricle demonstrates regional wall motion abnormalities. Posterior wall hypokinesis.   2. Impella measures about 3cm into LV cavity from aortic valve   3. Right ventricular systolic function is normal. The right ventricular size is normal.   Bilateral Carotid Dopplers  02/18/2020 Bilateral mild thickening without hemodynamically significant stenosis.  Vertebral artery flow is antegrade on the left not visualized on the right.  VAS [place] upper  extremity Venous Duplex Right 02/20/2020  Summary:    Right:  No evidence of deep vein thrombosis in the upper extremity. Findings  consistent  with acute superficial vein thrombosis involving the right cephalic vein.    Left:  No evidence of thrombosis in the subclavian.   PHYSICAL EXAM  General: Well-developed well-nourished male who is extubated now HEENT: [place]/AT, endotracheal tube in place Cardiovascular: Rate and rhythm Pulmonary: Clear to auscultation bilaterally Abdomen: Soft, nondistended, nontender Extremities: No peripheral edema Neurological:  Mental Status:  Alert, awake, oriented to time, place and person.  Follows commands appropriately. Cranial Nerves: II III,IV, VI: pupils equal, round, reactive to light and accommodation, slight skew deviation with right eye hypertropia.  V,VII: normal VIII: hearing normal  IX,X: Uvula rises symmetrically XI: Unable to perform XII: Midline tongue extension without atrophy or fasciculations  Motor: Right :  Upper extremity   4/5                                      Left:     Upper extremity   1/5             Lower extremity   4/5                                                  Lower extremity   1/5 Tone and bulk:normal tone throughout; no atrophy noted Sensory: Pinprick and light touch normal on in RUE and RLE, decreased on LUE and LLE Deep Tendon Reflexes:  Right: Upper Extremity                       Left: Upper extremity   biceps (C-5 to C-6) 2/4                       biceps (C-5 to C-6) 3/4 tricep (C7) 2/4                                     triceps (C7) 3/4 Brachioradialis (C6) 2/4                      Brachioradialis (C6) 3/4  Lower Extremity Lower Extremity  quadriceps (L-2 to L-4) 2/4                 quadriceps (L-2 to L-4) 3/4  Achilles (S1) 2/4                                  Achilles (S1) 3/4  Plantars: Right: downgoing                                Left: downgoing Cerebellar: Unable to perform  finger-to-nose,  unable to perform heel-to-shin test Gait: Deferred   ASSESSMENT/PLAN [name] is a 65 y.o. male with history of hypertension, hyperlipidemia and OSA presenting 02/15/20 with cardiac arrest with successful PCI and drug-eluting stent placement left circumflex complicated by hypotension (Impella device) with subsequent left sided weakness. He did not receive IV t-PA due to late presentation (>4.5 hours from time of onset).   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  As well as embolization from distal clot on the Impella device  persistent altered mental status likely multifactorial encephalopathy due to ongoing febrile illness as well as medication effect of sedating medications  CT head: Subcentimeter hyperdense focus within the right thalamic region, suspicious for a small acute bleed. MRI correlation is recommended. Mild cerebral atrophy.   MRI : Multiple small acute infarcts within the frontal lobes. New right thalamic microhemorrhage, since 01/18/12.  MRA : Normal intracranial MRA.  Carotid Doppler: Bilateral mild thickening without hemodynamically significant stenosis.  Vertebral artery flow is antegrade on the left not visualized on the right.  2D Echo: EF 50 - 55%. No cardiac source of emboli identified.   VAS [place] upper extremity: Acute superficial vein thrombosis involving the right cephalic vein.  [place] Corona Virus 2 - negative  LDL 135  HgbA1c 6.1  VTE prophylaxis - none    Diet   DIET DYS 2 Room service appropriate? Yes with Assist; Fluid consistency: Thin    No antithrombotic prior to admission, now on aspirin 81 mg daily and clopidogrel 75 mg daily.   Patient will be counseled to be compliant with his antithrombotic medications  Ongoing aggressive stroke risk factor management  Therapy recommendations: CIR  Disposition:  pending  Hypertension  Home BP  meds: Zestril  Current BP meds: norepinephrine ; vasopressin  Stable  Long-term BP goal normotensive  Hyperlipidemia  Home Lipid lowering medication: Zocor 20 mg daily  LDL 135, goal < 70  Current lipid lowering medication: consider Lipitor 80 mg daily   Continue statin at discharge   Other Active Problems, Findings, Recommendations and/or Plan  Code status -  Full  Intubated   NPO -> tube feedings  Hyperglycemia ->SSI    Anemia - Hgb - 9.0  Mild thrombocytopenia - platelets - 138  HCAP vs Aspiration pneumonia - Rocephin started 02/16/20, Febrile on Ceftriaxone (Rocephin) , so changed to Vancomycin and Cefepime on 1/18.    Hospital day # 8 Patient neurological exam continues to improve now day by day continue physical occupational therapy and when hemodynamically stable consider transfer to inpatient rehab.  Continue aspirin and Plavix given his recent MI cardiac stent    discussed with patient's nephew at the bedside  and answered questions.  Discussed with Dr. Gala Romney   greater than 50% time during this 25-minute visit was spent on counseling and coordination of care about a stroke and answering questions. Stroke team will sign off.  Follow-up as an outpatient stroke clinic in 6  weeks. Delia Heady, MD     To contact Stroke Continuity provider, please refer to [email or web address]. After hours, contact General Neurology

## 2020-02-23 NOTE — Progress Notes (Signed)
K 3.6 Electrolytes replaced per protocol 

## 2020-02-23 NOTE — Consult Note (Signed)
Physical Medicine and Rehabilitation Consult Reason for Consult: Left-sided weakness Referring Physician: Dr. Haroldine Laws   HPI: Jeffrey Morse is a 65 y.o. right-handed male with history of hypertension as well as hyperlipidemia.  Per chart review patient lives with spouse independent prior to admission working full-time.  1 level home 3 steps to entry.  Presented 02/15/2020 with chest pain and went to the urgent care where he collapsed and CPR was initiated.  He was in ventricular fibrillation and was shocked successfully.  CPR was done for approximately 15 minutes.  While in the ambulance he lost pulse again went into PEA CPR resumed.  He did require intubation.  The patient was hypotensive but improved with epinephrine.  Echocardiogram revealed severely reduced LV systolic function no evidence of pericardial effusion.  EKG after CPR showed evidence of inferior ST elevation with lateral involvement suggestive of left circumflex occlusion.  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  Recurrent VT after cardiac cath defib x1 maintained on amiodarone.  Neurology consulted 02/17/2020 for left-sided weakness.  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  New right thalamic microhemorrhage as compared to scan from 01/18/2012.  MRA was unremarkable.  Currently maintained on aspirin and Plavix for CVA prophylaxis.  Hospital course ileus his diet has been slowly advanced.  Patient did have persistent fevers there was some question related to a right upper extremity thrombus versus pneumonia it did resolve after Precedex stopped so felt to be possibly drug fever.  Lower extremity Dopplers were negative.  Therapy evaluations completed with recommendations of physical medicine rehab consult.  Pt has moderate encephalopathy- asking a lot of tangential questions and almost giddy in behavior.  Son at bedside.   Review of Systems   Unable to perform ROS: Acuity of condition  Constitutional: Negative for chills and fever.  HENT: Negative for hearing loss.   Eyes: Negative for blurred vision and double vision.  Respiratory: Positive for shortness of breath.   Cardiovascular: Positive for chest pain.  Gastrointestinal: Positive for constipation. Negative for heartburn, nausea and vomiting.  Genitourinary: Negative for dysuria, flank pain and hematuria.  Musculoskeletal: Positive for myalgias.  Skin: Negative for rash.  All other systems reviewed and are negative.  Past Medical History:  Diagnosis Date  . HTN (hypertension)   . Hypercholesteremia   . Sleep apnea    Past Surgical History:  Procedure Laterality Date  . BACK SURGERY    . CORONARY STENT INTERVENTION N/A 02/15/2020   Procedure: CORONARY STENT INTERVENTION;  Surgeon: Wellington Hampshire, MD;  Location: [facility];  Service: Cardiovascular;  Laterality: N/A;  CFX  . KNEE SURGERY Left 2012  . LEFT HEART CATH AND CORONARY ANGIOGRAPHY N/A 02/15/2020   Procedure: LEFT HEART CATH AND CORONARY ANGIOGRAPHY;  Surgeon: Wellington Hampshire, MD;  Location: [facility];  Service: Cardiovascular;  Laterality: N/A;  . RIGHT HEART CATH N/A 02/15/2020   Procedure: RIGHT HEART CATH;  Surgeon: Wellington Hampshire, MD;  Location: [facility];  Service: Cardiovascular;  Laterality: N/A;  . VENTRICULAR ASSIST DEVICE INSERTION N/A 02/15/2020   Procedure: VENTRICULAR ASSIST DEVICE INSERTION;  Surgeon: Wellington Hampshire, MD;  Location: [facility];  Service: Cardiovascular;  Laterality: N/A;  iMPELLA    Family History  Problem Relation Age of Onset  . Colon cancer Mother   . Stroke Father    Social History:  reports that  he has quit smoking. He has never used smokeless tobacco. He reports previous alcohol use. He reports that he does not use drugs. Allergies: No Known Allergies Medications Prior to Admission  Medication Sig Dispense Refill  .  escitalopram (LEXAPRO) 20 MG tablet Take 20 mg by mouth daily.    Marland Kitchen lisinopril (ZESTRIL) 10 MG tablet Take 10 mg by mouth daily.    Marland Kitchen omeprazole (PRILOSEC) 20 MG capsule Take 20 mg by mouth daily.    . simvastatin (ZOCOR) 20 MG tablet Take 20 mg by mouth daily.      Home: Home Living Family/patient expects to be discharged to:: Private residence Living Arrangements: Spouse/significant other Available Help at Discharge: Family Type of Home: Mobile home Home Access: Stairs to enter Technical brewer of Steps: 3 Entrance Stairs-Rails: Can reach both Home Layout: One level Bathroom Shower/Tub: Tub only Biochemist, clinical: Standard Home Equipment: None  Functional History: Prior Function Level of Independence: Independent Comments: independent, works in [employer], working the day prior to MI Functional Status:  Mobility: Bed Mobility Overal bed mobility: Needs Assistance Bed Mobility: Supine to Sit Supine to sit: Max assist,+2 for physical assistance,HOB elevated Sit to supine: Total assist,+2 for physical assistance,HOB elevated General bed mobility comments: Needed assist for LES and elevation of trunk with use of pad to scoot pt to EOB.  Pt flexed posture and head flexed as well with rounded shoulders in sitting. Transfers Overall transfer level: Needs assistance Equipment used: 2 person hand held assist Transfers: Squat Pivot Transfers Squat pivot transfers: Max assist,+2 physical assistance General transfer comment: Pt needed max of 2 persons to squat and pivot to chair with use of pad.      ADL: ADL Overall ADL's : Needs assistance/impaired Eating/Feeding: NPO Grooming: Wash/dry hands,Wash/dry face,Maximal assistance,Sitting Grooming Details (indicate cue type and reason): +2 to maintain upright sitting edge of bed Upper Body Bathing: Maximal assistance,Sitting Upper Body Bathing Details (indicate cue type and reason): balance support Lower Body Bathing: Total  assistance,Bed level Upper Body Dressing : Bed level,Total assistance Lower Body Dressing: Total assistance,Bed level Toileting- Clothing Manipulation and Hygiene: Bed level,Total assistance Functional mobility during ADLs: +2 for physical assistance,Maximal assistance General ADL Comments: squat pivot to recliner  Cognition: Cognition Overall Cognitive Status: Impaired/Different from baseline Orientation Level: Oriented to person,Oriented to place,Oriented to time,Disoriented to situation Cognition Arousal/Alertness: Awake/alert Behavior During Therapy: Restless,Impulsive Overall Cognitive Status: Impaired/Different from baseline Area of Impairment: Following commands,Awareness,Safety/judgement,Orientation Orientation Level: Person,Place Following Commands: Follows one step commands with increased time Safety/Judgement: Decreased awareness of safety,Decreased awareness of deficits Awareness: Emergent General Comments: Pt more alert and interactive today. Difficult to assess due to: Level of arousal,Intubated  Blood pressure 136/84, pulse 87, temperature 98.7 F (37.1 C), temperature source Oral, resp. rate (!) 44, height _0  (1.778 m), weight 97.9 kg, SpO2 98 %. Physical Exam Vitals and nursing note reviewed. Exam conducted with a chaperone present.  Constitutional:      Comments: Pt older male laying in bed- giddy in behaviors, tangential in speech and somewhat confused about why here, son at bedside, NAD  HENT:     Head: Normocephalic and atraumatic.     Comments: Tongue midline- equivocal smile    Right Ear: External ear normal.     Left Ear: External ear normal.     Nose: Nose normal.     Comments: cortrak in place    Mouth/Throat:     Mouth: Mucous membranes are dry.     Pharynx: No oropharyngeal exudate.  Eyes:     General:        Right eye: No discharge.        Left eye: No discharge.     Extraocular Movements: Extraocular movements intact.     Comments: No  nystagmus  Cardiovascular:     Rate and Rhythm: Normal rate and regular rhythm.     Pulses: Normal pulses.     Heart sounds: Normal heart sounds. No murmur heard. No gallop.   Pulmonary:     Comments: CTA B/L- no W/R/R- good air movement  Abdominal:     Comments: Soft, NT, ND, (+)BS - has rectal tube in place- draining liquid stool  Genitourinary:    Comments: Penis has foley in it- has some bruising noted on shaft/head from R groin bruising- purple/yellow bruising in R groin and shaft of penis Musculoskeletal:     Cervical back: Normal range of motion. No rigidity.     Comments: RYE- 5/5- in biceps, triceps, grip and finger abd LUE- biceps 4-/5, triceps 3+/5, grip 4-/5, and fnger abd 3/5 RLE_ 5/5 in HF, KE, DF and PF LLE- HF 2+/5, KE 3+/5, DF and PF 3+/5  Prevalon boot on LLE  Skin:    General: Skin is warm and dry.     Comments: Purple bruising on R groin and shaft of penis- no open areas RUE PICC- looks OK  Neurological:     Comments: Patient was lethargic.  Difficult to follow commands.  Overall examination was limited.  Encephalopathic, tangential, couldn't keep on topic, giddy in nature- laughing and playful, but confused- Ox1-2 Intact to light touch in all 4 extremities per pt   Psychiatric:     Comments: Confused, giddy/playful     Results for orders placed or performed during the hospital encounter of 02/15/20 (from the past 24 hour(s))  Glucose, capillary     Status: Abnormal   Collection Time: 02/22/20 11:37 AM  Result Value Ref Range   Glucose-Capillary 219 (H) 70 - 99 mg/dL  Glucose, capillary     Status: Abnormal   Collection Time: 02/22/20  3:24 PM  Result Value Ref Range   Glucose-Capillary 181 (H) 70 - 99 mg/dL  POCT I-Stat EG7     Status: Abnormal   Collection Time: 02/22/20  3:56 PM  Result Value Ref Range   pH, Ven 7.445 (H) 7.250 - 7.430   pCO2, Ven 35.0 (L) 44.0 - 60.0 mmHg   pO2, Ven 25.0 (LL) 32.0 - 45.0 mmHg   Bicarbonate 24.0 20.0 - 28.0  mmol/L   TCO2 25 22 - 32 mmol/L   O2 Saturation 49.0 %   Acid-Base Excess 0.0 0.0 - 2.0 mmol/L   Sodium 150 (H) 135 - 145 mmol/L   Potassium 3.3 (L) 3.5 - 5.1 mmol/L   Calcium, Ion 1.20 1.15 - 1.40 mmol/L   HCT 29.0 (L) 39.0 - 52.0 %   Hemoglobin 9.9 (L) 13.0 - 17.0 g/dL   Patient temperature 99.1 F    Sample type VENOUS    Comment NOTIFIED PHYSICIAN   Basic metabolic panel     Status: Abnormal   Collection Time: 02/22/20  3:57 PM  Result Value Ref Range   Sodium 146 (H) 135 - 145 mmol/L   Potassium 3.3 (L) 3.5 - 5.1 mmol/L   Chloride 112 (H) 98 - 111 mmol/L   CO2 23 22 - 32 mmol/L   Glucose, Bld 196 (H) 70 - 99 mg/dL   BUN 27 (H) 8 - 23 mg/dL  Creatinine, Ser 0.83 0.61 - 1.24 mg/dL   Calcium 8.5 (L) 8.9 - 10.3 mg/dL   GFR, Estimated >60 >60 mL/min   Anion gap 11 5 - 15  Glucose, capillary     Status: Abnormal   Collection Time: 02/22/20  8:42 PM  Result Value Ref Range   Glucose-Capillary 220 (H) 70 - 99 mg/dL  Glucose, capillary     Status: Abnormal   Collection Time: 02/23/20 12:54 AM  Result Value Ref Range   Glucose-Capillary 242 (H) 70 - 99 mg/dL  Glucose, capillary     Status: Abnormal   Collection Time: 02/23/20  3:24 AM  Result Value Ref Range   Glucose-Capillary 230 (H) 70 - 99 mg/dL  Cooxemetry Panel (carboxy, met, total hgb, O2 sat)     Status: Abnormal   Collection Time: 02/23/20  3:55 AM  Result Value Ref Range   Total hemoglobin 9.5 (L) 12.0 - 16.0 g/dL   O2 Saturation 71.7 %   Carboxyhemoglobin 1.4 0.5 - 1.5 %   Methemoglobin 0.9 0.0 - 1.5 %  Magnesium     Status: None   Collection Time: 02/23/20  3:55 AM  Result Value Ref Range   Magnesium 2.0 1.7 - 2.4 mg/dL  Phosphorus     Status: None   Collection Time: 02/23/20  3:55 AM  Result Value Ref Range   Phosphorus 2.7 2.5 - 4.6 mg/dL  Basic metabolic panel     Status: Abnormal   Collection Time: 02/23/20  3:55 AM  Result Value Ref Range   Sodium 142 135 - 145 mmol/L   Potassium 3.6 3.5 - 5.1  mmol/L   Chloride 109 98 - 111 mmol/L   CO2 23 22 - 32 mmol/L   Glucose, Bld 230 (H) 70 - 99 mg/dL   BUN 25 (H) 8 - 23 mg/dL   Creatinine, Ser 0.81 0.61 - 1.24 mg/dL   Calcium 8.1 (L) 8.9 - 10.3 mg/dL   GFR, Estimated >60 >60 mL/min   Anion gap 10 5 - 15  CBC     Status: Abnormal   Collection Time: 02/23/20  3:55 AM  Result Value Ref Range   WBC 9.8 4.0 - 10.5 K/uL   RBC 2.93 (L) 4.22 - 5.81 MIL/uL   Hemoglobin 8.7 (L) 13.0 - 17.0 g/dL   HCT 26.0 (L) 39.0 - 52.0 %   MCV 88.7 80.0 - 100.0 fL   MCH 29.7 26.0 - 34.0 pg   MCHC 33.5 30.0 - 36.0 g/dL   RDW 14.9 11.5 - 15.5 %   Platelets 284 150 - 400 K/uL   nRBC 0.0 0.0 - 0.2 %  Glucose, capillary     Status: Abnormal   Collection Time: 02/23/20  6:52 AM  Result Value Ref Range   Glucose-Capillary 207 (H) 70 - 99 mg/dL   DG Abd Portable 1V  Result Date: 02/21/2020 CLINICAL DATA:  Feeding tube placement. EXAM: PORTABLE ABDOMEN - 1 VIEW COMPARISON:  02/21/2020. FINDINGS: Feeding tube tip noted over the distal portion of the duodenum at the level of the ligament of Treitz. Contrast noted in the duodenum and proximal jejunum. IMPRESSION: Feeding tube tip noted over the distal duodenum at the level of the ligament of Treitz. Electronically Signed   By: Marcello Moores  Register   On: 02/21/2020 12:54   VAS [place] LOWER EXTREMITY VENOUS (DVT)  Result Date: 02/21/2020  Lower Venous DVT Study Indications: Edema.  Comparison Study: no prior Performing Technologist: Abram Sander RVS  Examination Guidelines: A  complete evaluation includes B-mode imaging, spectral Doppler, color Doppler, and power Doppler as needed of all accessible portions of each vessel. Bilateral testing is considered an integral part of a complete examination. Limited examinations for reoccurring indications may be performed as noted. The reflux portion of the exam is performed with the patient in reverse Trendelenburg.   +---------+---------------+---------+-----------+----------+--------------+ RIGHT    CompressibilityPhasicitySpontaneityPropertiesThrombus Aging +---------+---------------+---------+-----------+----------+--------------+ CFV      Full           Yes      Yes                                 +---------+---------------+---------+-----------+----------+--------------+ SFJ      Full                                                        +---------+---------------+---------+-----------+----------+--------------+ FV Prox  Full                                                        +---------+---------------+---------+-----------+----------+--------------+ FV Mid   Full                                                        +---------+---------------+---------+-----------+----------+--------------+ FV DistalFull                                                        +---------+---------------+---------+-----------+----------+--------------+ PFV      Full                                                        +---------+---------------+---------+-----------+----------+--------------+ POP      Full           Yes      Yes                                 +---------+---------------+---------+-----------+----------+--------------+ PTV      Full                                                        +---------+---------------+---------+-----------+----------+--------------+ PERO     Full                                                        +---------+---------------+---------+-----------+----------+--------------+   +---------+---------------+---------+-----------+----------+--------------+  LEFT     CompressibilityPhasicitySpontaneityPropertiesThrombus Aging +---------+---------------+---------+-----------+----------+--------------+ CFV      Full           Yes      Yes                                  +---------+---------------+---------+-----------+----------+--------------+ SFJ      Full                                                        +---------+---------------+---------+-----------+----------+--------------+ FV Prox  Full                                                        +---------+---------------+---------+-----------+----------+--------------+ FV Mid   Full                                                        +---------+---------------+---------+-----------+----------+--------------+ FV DistalFull                                                        +---------+---------------+---------+-----------+----------+--------------+ PFV      Full                                                        +---------+---------------+---------+-----------+----------+--------------+ POP      Full           Yes      Yes                                 +---------+---------------+---------+-----------+----------+--------------+ PTV      Full                                                        +---------+---------------+---------+-----------+----------+--------------+ PERO     Full                                                        +---------+---------------+---------+-----------+----------+--------------+     Summary: BILATERAL: - No evidence of deep vein thrombosis seen in the lower extremities, bilaterally. - No evidence of superficial venous thrombosis in the lower extremities, bilaterally. -No evidence of popliteal cyst, bilaterally.   *See table(s) above for measurements and observations. Electronically  signed by Deitra Mayo MD on 02/21/2020 at 4:07:53 PM.    Final      Assessment/Plan: 1. 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 S/P PCI/and stent 2. Does the need for close, 24  hr/day medical supervision in concert with the patient's rehab needs make it unreasonable for this patient to be served in a less intensive setting? Yes 3. Co-Morbidities requiring supervision/potential complications: as above, HTN, HLD, GERD, esophageal stretching 12/21 4. Due to bladder management, bowel management, safety, skin/wound care, disease management, medication administration, pain management and patient education, does the patient require 24 hr/day rehab nursing? Yes 5. Does the patient require coordinated care of a physician, rehab nurse, therapy disciplines of PT and OT and SLP to address physical and functional deficits in the context of the above medical diagnosis(es)? Yes Addressing deficits in the following areas: balance, endurance, locomotion, strength, transferring, bowel/bladder control, bathing, dressing, feeding, grooming, toileting, cognition, speech, swallowing and psychosocial support 6. Can the patient actively participate in an intensive therapy program of at least 3 hrs of therapy per day at least 5 days per week? Yes 7. The potential for patient to make measurable gains while on inpatient rehab is good and fair 8. Anticipated functional outcomes upon discharge from inpatient rehab are supervision and min assist  with PT, supervision and min assist with OT, supervision and min assist with SLP. 9. Estimated rehab length of stay to reach the above functional goals is: ` 3+ weeks 10. Anticipated discharge destination: Home 11. Overall Rehab/Functional Prognosis: good and fair  RECOMMENDATIONS: This patient's condition is appropriate for continued rehabilitative care in the following setting: CIR Patient has agreed to participate in recommended program. Potentially Note that insurance prior authorization may be required for reimbursement for recommended care.  Comment:  1. Pt is a great candidate for inpt rehab, however needs to be more medically ready/i.e off monitor, on  stable ABX regimen 2. Also has Dysphagia- has Cortrak but on D2 thin diet- 3. Will submit for inpt CIR- will d/w admissions coordinators 4. Thank you for this consult   Cathlyn Parsons, PA-C 02/23/2020    I have personally performed a face to face diagnostic evaluation of this patient and formulated the key components of the plan.  Additionally, I have personally reviewed laboratory data, imaging studies, as well as relevant notes and concur with the physician assistant's documentation above.

## 2020-02-23 NOTE — Progress Notes (Signed)
Advanced Heart Failure Rounding Note   Subjective:    1/12 admitted for Vfib arrest, cath PCI/DES and impella 1/14 VT arrest, ROSC after 3 minutes.  1/15 Impella removed 1/16 Left sided weakness. MRI with multiple small infarcts and small ICH 1/18 Extubated.   More awake. Interactive but seems a bit confused at times. Able to move left side better. Denies CP or SOB. Passed swallow study.    1/13 Echo EF ~50-55% LV small RV ok.   Objective:   Weight Range:  Vital Signs:   Temp:  [98.4 F (36.9 C)-99.8 F (37.7 C)] 99.1 F (37.3 C) (01/20 1139) Pulse Rate:  [39-114] 86 (01/20 1500) Resp:  [14-44] 15 (01/20 1500) BP: (107-155)/(63-96) 137/85 (01/20 1500) SpO2:  [87 %-99 %] 99 % (01/20 1500) Weight:  [97.9 kg] 97.9 kg (01/20 0500) Last BM Date: 02/23/20  Weight change: Filed Weights   02/21/20 0600 02/22/20 0400 02/23/20 0500  Weight: 96.4 kg 94.5 kg 97.9 kg    Intake/Output:   Intake/Output Summary (Last 24 hours) at 02/23/2020 1548 Last data filed at 02/23/2020 1500 Gross per 24 hour  Intake 1370.56 ml  Output 2925 ml  Net -1554.44 ml     Physical Exam: General:  Sitting up in bed. No resp difficulty HEENT: normal + cor-trak Neck: supple. no JVD. Carotids 2+ bilat; no bruits. No lymphadenopathy or thryomegaly appreciated. Cor: PMI nondisplaced. Regular rate & rhythm. No rubs, gallops or murmurs. Lungs: clear Abdomen: soft, nontender, nondistended. No hepatosplenomegaly. No bruits or masses. Good bowel sounds. Extremities: no cyanosis, clubbing, rash, edema Neuro: alert & orientedx3, cranial nerves grossly intact. Weak on left but improving  Telemetry: SR 80-90s with occasional PVCs Personally reviewed.    Labs: Basic Metabolic Panel: Recent Labs  Lab 02/17/20 1852 02/18/20 0409 02/20/20 0504 02/20/20 0508 02/21/20 0452 02/21/20 0536 02/21/20 1300 02/21/20 1644 02/21/20 2225 02/22/20 0410 02/22/20 0456 02/22/20 1556 02/22/20 1557  02/23/20 0355  NA  --    < > 141   < > 141   < > 141   < > 143 147* 143 150* 146* 142  K  --    < > 3.2*   < > 3.0*   < > 3.5   < > 3.6 2.9* 2.8* 3.3* 3.3* 3.6  CL  --    < > 102   < > 102  --  103  --  107  --  108  --  112* 109  CO2  --    < > 24   < > 24  --  23  --  23  --  24  --  23 23  GLUCOSE  --    < > 128*   < > 124*  --  125*  --  185*  --  195*  --  196* 230*  BUN  --    < > 23   < > 22  --  23  --  26*  --  27*  --  27* 25*  CREATININE  --    < > 1.04   < > 1.02  --  1.09  --  1.00  --  0.87  --  0.83 0.81  CALCIUM  --    < > 8.5*   < > 8.4*  --  8.4*  --  8.6*  --  8.8*  --  8.5* 8.1*  MG 1.8  --  2.0  --  1.9  --   --   --   --   --  2.2  --   --  2.0  PHOS 2.1*  --  3.8  --  3.8  --   --   --   --   --  2.1*  --   --  2.7   < > = values in this interval not displayed.    Liver Function Tests: No results for input(s): AST, ALT, ALKPHOS, BILITOT, PROT, ALBUMIN in the last 168 hours. No results for input(s): LIPASE, AMYLASE in the last 168 hours. No results for input(s): AMMONIA in the last 168 hours.  CBC: Recent Labs  Lab 02/19/20 0427 02/20/20 0504 02/20/20 0508 02/21/20 0452 02/21/20 0536 02/21/20 1644 02/22/20 0410 02/22/20 0456 02/22/20 1556 02/23/20 0355  WBC 7.5 6.9  --  8.3  --   --   --  9.2  --  9.8  HGB 9.9* 8.9*   < > 9.9*   < > 10.5* 10.2* 10.3* 9.9* 8.7*  HCT 30.7* 27.7*   < > 30.4*   < > 31.0* 30.0* 31.5* 29.0* 26.0*  MCV 91.4 89.6  --  88.1  --   --   --  88.7  --  88.7  PLT 154 172  --  223  --   --   --  271  --  284   < > = values in this interval not displayed.    Cardiac Enzymes: No results for input(s): CKTOTAL, CKMB, CKMBINDEX, TROPONINI in the last 168 hours.  BNP: BNP (last 3 results) No results for input(s): BNP in the last 8760 hours.  ProBNP (last 3 results) No results for input(s): PROBNP in the last 8760 hours.    Other results:  Imaging: No results found.   Medications:     Scheduled Medications: . aspirin  81  mg Oral Daily  . Chlorhexidine Gluconate Cloth  6 each Topical Daily  . clopidogrel  75 mg Oral Daily  . feeding supplement  237 mL Oral BID BM  . feeding supplement (OSMOLITE 1.5 CAL)  1,000 mL Per Tube Q24H  . feeding supplement (PROSource TF)  45 mL Per Tube QID  . fiber  1 packet Per Tube BID  . furosemide  40 mg Oral Daily  . insulin aspart  0-15 Units Subcutaneous TID AC & HS  . mouth rinse  15 mL Mouth Rinse BID  . nystatin  5 mL Mouth/Throat QID  . pantoprazole  40 mg Oral Daily  . potassium chloride  40 mEq Oral BID  . sacubitril-valsartan  1 tablet Oral BID  . sodium chloride flush  10-40 mL Intracatheter Q12H  . sodium chloride flush  3 mL Intravenous Q12H  . spironolactone  25 mg Oral Daily  . tamsulosin  0.4 mg Oral QPC supper    Infusions: . sodium chloride 250 mL (02/23/20 0620)  . ceFEPime (MAXIPIME) IV Stopped (02/23/20 1402)  . lactated ringers Stopped (02/21/20 1415)    PRN Medications: sodium chloride, acetaminophen **OR** acetaminophen (TYLENOL) oral liquid 160 mg/5 mL **OR** acetaminophen, melatonin, ondansetron (ZOFRAN) IV, sodium chloride flush, sodium chloride flush   Assessment/Plan:   1. Cardiac (VTarrest in setting of acute inferolateral STEMI/VT - OOH cardiac arrest on 1/12 with prolonged CPR, resuscitated w/ CPR + defibrillation   - Emergent cath 1/12 w/ totally occluded mid LCx treated w/ PCI + DES - Recurrent VT on 1/14 -> defib x 1 - Off amiodarone  -Supp K - Mag stable.   2. Acute systolic HF due in setting of acute MI ->  cardiogenic shock - EF 25% in cath lab 1/12 - Echo 1/13 EF 50-55% - Impella pulled 1/14 - CVP remians ~3. No lasix today. - CO-OX remains stable.  - Renal function stable.  - Continue spiro 25 - Add low dose Entresto   3. CAD with acute inferolateral  STEMI  - Emergent cath 1/12 w/ totally occluded mid LCx treated w/ PCI + DES - There is moderate ostial LAD disease and significant disease in the distal LAD close  to the apex. No significant disease affecting the right coronary artery. - Continue DAPT. Restart statin - Brilinta switched to Plavix; Plavix therapeutic per P2y12 lab.  - No b-blocker yet with shock   4. Acute CVA with ICH - left-sided weakness --> now not following commands; repeat CT shows stable hemorrhage - MRI with multiple small infarcts and small ICH (Likely cardioembolic from arrest) - Neuro following - Brilinta switched to Plavix - PT/OT/Speech  - L-sided deficits improved today.   5. Acute hypoxic respiratory failure in setting of cardiac arrest - CXR atelectasis, CPT 8.54-->6 -> 3.7; FiO2 50% - Broadened abx d/t fever (Blood cx NGTD, sputum growing similar flora from previous respiratory cultures from 1/14)  - Extubated 02/21/20 - table  6, Ileus - Cor-trac placed 02/21/20 - On daily reglan (dosing limited by QT prolongation) - Has flexi seal   7. AKI - due to ATN -> resolved  8. DM2 - HgbA1c 6.0, -SSI - Add Jardiance prior to d/c  9. Bradycardia -Resolved.   10.  Febrile - 1/16 urine, blood and respiratory cultures sent NGTD - Remains febrile.   11. R Cephalic superficial venous thrombosis - Continue to monitor - Elevate arm. Warm compresses   Length of Stay: 8   Arvilla Meres MD 02/23/2020, 3:48 PM  Advanced Heart Failure Team Pager [phone] (M-F; 7a - 4p)  Please contact [facility] for night-coverage after hours (4p -7a ) and weekends on [email or web address]

## 2020-02-23 NOTE — Progress Notes (Signed)
Nutrition Follow-up  DOCUMENTATION CODES:   Not applicable  INTERVENTION:   Transition to nocturnal feedings: -Osmolite 1.5 @ 85 ml/hr x 14 hrs via Cortrak (1800-0800) -ProSource TF 45 ml QID  Provides: 1945 kcals, 119 grams protein, 907 ml free water. Meets 85% kcal and protein needs.   Add Ensure Enlive po BID, each supplement provides 350 kcal and 20 grams of protein  NUTRITION DIAGNOSIS:   Increased nutrient needs related to post-op healing as evidenced by estimated needs.  Ongoing  GOAL:   Patient will meet greater than or equal to 90% of their needs  Met via TF  MONITOR:   Diet advancement,Labs,Weight trends,TF tolerance,Skin,I & O's  REASON FOR ASSESSMENT:   Consult Enteral/tube feeding initiation and management  ASSESSMENT:   Patient with PMH of HTN.  Presents this admission with VT arrest.  01/12 - s/p cathwith DES x 1 and impella placement 01/14 - code blue for spontaneous pulseless VT arrest, ROSC achieved after 3 minutes, impella removed, MRI with multiple small infarcts and small ICH 01/15 - impella removed 01/18 - extubated, Cortrak placed with tip at LOT  Diet advanced to DYS 2 with thin liquids yesterday. Meal completions charted as 0% since change. Discussed with RN, pt does not like the taste of food and does not have much of an appetite. Currently tolerating continuous feeding at goal rate. Transition to nocturnal to promote intake during the day. Add PO supplements. Plan to wean off TF once PO intake progresses.   Noted ongoing loose stool. Add nutrisource fiber BID.   Admission weight: 96 kg  Current weight: 97.9 kg   UOP: 1100 ml x 24 hrs  Stool: 800 ml x 24 hrs   Medications: 40 mg lasix daily, SS novolog, 40 mEq KCl BID, aldactone Labs: CBG 118-230  Diet Order:   Diet Order            DIET DYS 2 Room service appropriate? Yes with Assist; Fluid consistency: Thin  Diet effective now                 EDUCATION NEEDS:   Not  appropriate for education at this time  Skin:  Skin Assessment: Skin Integrity Issues: Incisions: abdomen  Last BM:  1/20  Height:   Ht Readings from Last 1 Encounters:  02/15/20 5' 10"  (1.778 m)    Weight:   Wt Readings from Last 1 Encounters:  02/23/20 97.9 kg    BMI:  Body mass index is 30.97 kg/m.  Estimated Nutritional Needs:   Kcal:  4098-1191  Protein:  140-165 grams  Fluid:  >/= 2 L/day   Mariana Single RD, LDN Clinical Nutrition Pager listed in [place]

## 2020-02-24 LAB — CULTURE, BLOOD (ROUTINE X 2)
Culture: NO GROWTH
Culture: NO GROWTH
Special Requests: ADEQUATE

## 2020-02-24 LAB — BASIC METABOLIC PANEL
Anion gap: 10 (ref 5–15)
BUN: 18 mg/dL (ref 8–23)
CO2: 21 mmol/L — ABNORMAL LOW (ref 22–32)
Calcium: 7.9 mg/dL — ABNORMAL LOW (ref 8.9–10.3)
Chloride: 108 mmol/L (ref 98–111)
Creatinine, Ser: 0.67 mg/dL (ref 0.61–1.24)
GFR, Estimated: >60 mL/min (ref >60)
Glucose, Bld: 255 mg/dL — ABNORMAL HIGH (ref 70–99)
Potassium: 3.9 mmol/L (ref 3.5–5.1)
Sodium: 139 mmol/L (ref 135–145)

## 2020-02-24 LAB — CBC
HCT: 30 % — ABNORMAL LOW (ref 39.0–52.0)
Hemoglobin: 9.5 g/dL — ABNORMAL LOW (ref 13.0–17.0)
MCH: 28.4 pg (ref 26.0–34.0)
MCHC: 31.7 g/dL (ref 30.0–36.0)
MCV: 89.8 fL (ref 80.0–100.0)
Platelets: 355 10*3/uL (ref 150–400)
RBC: 3.34 MIL/uL — ABNORMAL LOW (ref 4.22–5.81)
RDW: 15.1 % (ref 11.5–15.5)
WBC: 9.3 10*3/uL (ref 4.0–10.5)
nRBC: 0 % (ref 0.0–0.2)

## 2020-02-24 LAB — COOXEMETRY PANEL
Carboxyhemoglobin: 1.4 % (ref 0.5–1.5)
Methemoglobin: 0.8 % (ref 0.0–1.5)
O2 Saturation: 66.3 %
Total hemoglobin: 8.3 g/dL — ABNORMAL LOW (ref 12.0–16.0)

## 2020-02-24 LAB — GLUCOSE, CAPILLARY
Glucose-Capillary: 179 mg/dL — ABNORMAL HIGH (ref 70–99)
Glucose-Capillary: 212 mg/dL — ABNORMAL HIGH (ref 70–99)
Glucose-Capillary: 215 mg/dL — ABNORMAL HIGH (ref 70–99)
Glucose-Capillary: 216 mg/dL — ABNORMAL HIGH (ref 70–99)
Glucose-Capillary: 226 mg/dL — ABNORMAL HIGH (ref 70–99)
Glucose-Capillary: 230 mg/dL — ABNORMAL HIGH (ref 70–99)

## 2020-02-24 LAB — HEPATIC FUNCTION PANEL
ALT: 110 U/L — ABNORMAL HIGH (ref 0–44)
AST: 133 U/L — ABNORMAL HIGH (ref 15–41)
Albumin: 2.2 g/dL — ABNORMAL LOW (ref 3.5–5.0)
Alkaline Phosphatase: 141 U/L — ABNORMAL HIGH (ref 38–126)
Bilirubin, Direct: 0.2 mg/dL (ref 0.0–0.2)
Indirect Bilirubin: 0.7 mg/dL (ref 0.3–0.9)
Total Bilirubin: 0.9 mg/dL (ref 0.3–1.2)
Total Protein: 5.7 g/dL — ABNORMAL LOW (ref 6.5–8.1)

## 2020-02-24 LAB — MAGNESIUM: Magnesium: 1.9 mg/dL (ref 1.7–2.4)

## 2020-02-24 LAB — PHOSPHORUS: Phosphorus: 3 mg/dL (ref 2.5–4.6)

## 2020-02-24 MED ORDER — CARVEDILOL 3.125 MG PO TABS
3.1250 mg | ORAL_TABLET | Freq: Two times a day (BID) | ORAL | Status: DC
  Administered 2020-02-24 – 2020-02-25 (×2): 3.125 mg via ORAL
  Filled 2020-02-24 (×2): qty 1

## 2020-02-24 MED ORDER — POTASSIUM CHLORIDE CRYS ER 20 MEQ PO TBCR: 40.0000 meq | EXTENDED_RELEASE_TABLET | Freq: Every day | ORAL | Status: DC

## 2020-02-24 MED ORDER — MAGNESIUM SULFATE 2 GM/50ML IV SOLN
2.0000 g | Freq: Once | INTRAVENOUS | Status: AC
  Administered 2020-02-24: 2 g via INTRAVENOUS
  Filled 2020-02-24: qty 50

## 2020-02-24 NOTE — Progress Notes (Signed)
Inpatient Rehabilitation Admissions Coordinator  I met with patient , his wife, and spoke with his sister, Eustaquio Maize on phone. We discussed goals and expectations of a possible CIR admit. Wife and sister state family will arrange 24/7 assist that is projected he will need after a CIR admit.I will follow up on Monday to begin insurance authorization for a possible Cir admit pending insurance approval and bed availability.  Danne Baxter, RN, MSN Rehab Admissions Coordinator [phone] 02/24/2020 4:06 PM

## 2020-02-24 NOTE — Progress Notes (Signed)
Inpatient Diabetes Program Recommendations  AACE/ADA: New Consensus Statement on Inpatient Glycemic Control (2015)  Target Ranges:  Prepandial:   less than 140 mg/dL      Peak postprandial:   less than 180 mg/dL (1-2 hours)      Critically ill patients:  140 - 180 mg/dL   Lab Results  Component Value Date   GLUCAP 212 (H) 02/24/2020   HGBA1C 6.1 (H) 02/18/2020    Review of Glycemic Control Results for Jeffrey Morse, Jeffrey Morse (MRN [id number]) as of 02/24/2020 13:39  Ref. Range 02/24/2020 04:52 02/24/2020 06:55 02/24/2020 11:16  Glucose-Capillary Latest Ref Range: 70 - 99 mg/dL 267 (H) 124 (H) 580 (H)    Current orders for Inpatient glycemic control: Novolog 0-15 units TID & HS Cortrak- Osmolite 1.5 @ 85 ml/hr from 1800-0800  Inpatient Diabetes Program Recommendations:    With current poor oral intake and nocturnal feeds, would recommend changing correction to Novolog 0-15 units Q4H.   Thanks, Lujean Rave, MSN, RNC-OB Diabetes Coordinator [phone] (8a-5p)

## 2020-02-24 NOTE — Progress Notes (Addendum)
Advanced Heart Failure Rounding Note   Subjective:    1/12 admitted for Vfib arrest, cath PCI/DES and impella 1/13 Echo EF ~50-55% LV small RV ok. 1/14 VT arrest, ROSC after 3 minutes.  1/15 Impella removed 1/16 Left sided weakness. MRI with multiple small infarcts and small ICH 1/18 Extubated.   Awake and alert today.  A little upset this morning feels like he's not progressing quickly enough but reassured him he is doing well, stood up with PT help, getting stronger, left sided weakness improving.    Objective:   Weight Range:  Vital Signs:   Temp:  [98 F (36.7 C)-99.1 F (37.3 C)] 98 F (36.7 C) (01/21 0655) Pulse Rate:  [85-95] 93 (01/21 1000) Resp:  [12-43] 43 (01/21 1000) BP: (124-151)/(65-119) 131/79 (01/21 1000) SpO2:  [94 %-99 %] 99 % (01/21 1000) Weight:  [98.7 kg] 98.7 kg (01/21 0338) Last BM Date: 02/24/20  Weight change: Filed Weights   02/22/20 0400 02/23/20 0500 02/24/20 0338  Weight: 94.5 kg 97.9 kg 98.7 kg    Intake/Output:   Intake/Output Summary (Last 24 hours) at 02/24/2020 1106 Last data filed at 02/24/2020 0800 Gross per 24 hour  Intake 999.69 ml  Output 3725 ml  Net -2725.31 ml     Physical Exam: General:  Sitting up in bed. No resp difficulty HEENT: normal + cor-trak Neck: supple. no JVD. Carotids 2+ bilat; no bruits. No lymphadenopathy or thryomegaly appreciated. Cor: PMI nondisplaced. Regular rate & rhythm. No rubs, gallops or murmurs. Lungs: Diminished bases otherwise clear Abdomen: soft, nontender, nondistended. No hepatosplenomegaly. No bruits or masses. Good bowel sounds. Extremities: no cyanosis, clubbing, rash, edema Neuro: alert & orientedx3, cranial nerves grossly intact. Weak on left but improving  Telemetry: SR 80-90s with occasional PVCs Personally reviewed.    Labs: Basic Metabolic Panel: Recent Labs  Lab 02/20/20 0504 02/20/20 0508 02/21/20 0452 02/21/20 0536 02/21/20 2225 02/22/20 0410 02/22/20 0456  02/22/20 1556 02/22/20 1557 02/23/20 0355 02/24/20 0404  NA 141   < > 141   < > 143   < > 143 150* 146* 142 139  K 3.2*   < > 3.0*   < > 3.6   < > 2.8* 3.3* 3.3* 3.6 3.9  CL 102   < > 102   < > 107  --  108  --  112* 109 108  CO2 24   < > 24   < > 23  --  24  --  23 23 21*  GLUCOSE 128*   < > 124*   < > 185*  --  195*  --  196* 230* 255*  BUN 23   < > 22   < > 26*  --  27*  --  27* 25* 18  CREATININE 1.04   < > 1.02   < > 1.00  --  0.87  --  0.83 0.81 0.67  CALCIUM 8.5*   < > 8.4*   < > 8.6*  --  8.8*  --  8.5* 8.1* 7.9*  MG 2.0  --  1.9  --   --   --  2.2  --   --  2.0 1.9  PHOS 3.8  --  3.8  --   --   --  2.1*  --   --  2.7 3.0   < > = values in this interval not displayed.    Liver Function Tests: Recent Labs  Lab 02/24/20 0404  AST 133*  ALT 110*  ALKPHOS 141*  BILITOT 0.9  PROT 5.7*  ALBUMIN 2.2*   No results for input(s): LIPASE, AMYLASE in the last 168 hours. No results for input(s): AMMONIA in the last 168 hours.  CBC: Recent Labs  Lab 02/20/20 0504 02/20/20 0508 02/21/20 0452 02/21/20 0536 02/22/20 0410 02/22/20 0456 02/22/20 1556 02/23/20 0355 02/24/20 0404  WBC 6.9  --  8.3  --   --  9.2  --  9.8 9.3  HGB 8.9*   < > 9.9*   < > 10.2* 10.3* 9.9* 8.7* 9.5*  HCT 27.7*   < > 30.4*   < > 30.0* 31.5* 29.0* 26.0* 30.0*  MCV 89.6  --  88.1  --   --  88.7  --  88.7 89.8  PLT 172  --  223  --   --  271  --  284 355   < > = values in this interval not displayed.    Cardiac Enzymes: No results for input(s): CKTOTAL, CKMB, CKMBINDEX, TROPONINI in the last 168 hours.  BNP: BNP (last 3 results) No results for input(s): BNP in the last 8760 hours.  ProBNP (last 3 results) No results for input(s): PROBNP in the last 8760 hours.    Other results:  Imaging: No results found.   Medications:     Scheduled Medications: . aspirin  81 mg Oral Daily  . Chlorhexidine Gluconate Cloth  6 each Topical Daily  . clopidogrel  75 mg Oral Daily  . feeding  supplement  237 mL Oral BID BM  . feeding supplement (PROSource TF)  45 mL Per Tube QID  . fiber  1 packet Per Tube BID  . insulin aspart  0-15 Units Subcutaneous TID AC & HS  . mouth rinse  15 mL Mouth Rinse BID  . nystatin  5 mL Mouth/Throat QID  . pantoprazole  40 mg Oral Daily  . potassium chloride  40 mEq Oral BID  . sacubitril-valsartan  1 tablet Oral BID  . sodium chloride flush  10-40 mL Intracatheter Q12H  . sodium chloride flush  3 mL Intravenous Q12H  . spironolactone  25 mg Oral Daily  . tamsulosin  0.4 mg Oral QPC supper    Infusions: . sodium chloride 250 mL (02/23/20 0620)  . ceFEPime (MAXIPIME) IV Stopped (02/24/20 0719)  . feeding supplement (OSMOLITE 1.5 CAL)    . lactated ringers Stopped (02/21/20 1415)    PRN Medications: sodium chloride, acetaminophen **OR** acetaminophen (TYLENOL) oral liquid 160 mg/5 mL **OR** acetaminophen, melatonin, ondansetron (ZOFRAN) IV, sodium chloride flush, sodium chloride flush   Assessment/Plan:   1. Cardiac (VTarrest in setting of acute inferolateral STEMI/VT - OOH cardiac arrest on 1/12 with prolonged CPR, resuscitated w/ CPR + defibrillation   - Emergent cath 1/12 w/ totally occluded mid LCx treated w/ PCI + DES - Recurrent VT on 1/14 -> defib x 1 - Off amiodarone  - Supp K - Mag stable.   2. Acute systolic HF due in setting of acute MI -> cardiogenic shock - EF 25% in cath lab 1/12 - Echo 1/13 EF 50-55% - Impella pulled 1/14 - CVP 5-7 - CO-OX remains stable.  - Renal function stable.  - Continue low dose entresto, spiro 25mg  no diuretic today  3. CAD with acute inferolateral  STEMI  - Emergent cath 1/12 w/ totally occluded mid LCx treated w/ PCI + DES - There is moderate ostial LAD disease and significant disease in the distal LAD close to the  apex. No significant disease affecting the right coronary artery. - Continue DAPT. Restart statin - Brilinta switched to Plavix; Plavix therapeutic per P2y12 lab.  - No  b-blocker yet with shock   4. Acute CVA with ICH - left-sided weakness --> now not following commands; repeat CT shows stable hemorrhage - MRI with multiple small infarcts and small ICH (Likely cardioembolic from arrest) - Neuro following - Brilinta switched to Plavix - PT/OT/Speech  - L-sided deficits improved today. Tranfer to [facility] and arrange CIR  5. Acute hypoxic respiratory failure in setting of cardiac arrest - CXR atelectasis, CPT 8.54-->6 -> 3.7; FiO2 50% - Broadened abx d/t fever (Blood cx NGTD, sputum growing similar flora from previous respiratory cultures from 1/14)  - Extubated 02/21/20 - decreased breath sounds in bases likely atelectasis  6, Ileus - Cor-trac placed 02/21/20 - resolved, no longer on reglan  7. AKI - due to ATN -> resolved   8. DM2 - HgbA1c 6.0, -SSI - Add Jardiance prior to d/c  9. Bradycardia -Resolved.   10.  Febrile - 1/16 urine, blood and respiratory cultures sent NGTD - Remains afebrile  11. R Cephalic superficial venous thrombosis - Continue to monitor - Elevate arm. Warm compresses   Length of Stay: 9   Angelita Ingles MD 02/24/2020, 11:06 AM  Advanced Heart Failure Team Pager [phone] (M-F; 7a - 4p)  Please contact [facility] for night-coverage after hours (4p -7a ) and weekends on [email or web address]   Patient seen and examined with the above-signed Advanced Practice Provider and/or Housestaff. I personally reviewed laboratory data, imaging studies and relevant notes. I independently examined the patient and formulated the important aspects of the plan. I have edited the note to reflect any of my changes or salient points. I have personally discussed the plan with the patient and/or family.  Denies CP or SOB. BP more stable. L-sided weakness improving. Still on dysphagia 2 diet. Cor-trak remains in place - transitioning to nocturnal TFs  General:  Well appearing. No resp difficulty HEENT: normal Neck: supple. no JVD. Carotids  2+ bilat; no bruits. No lymphadenopathy or thryomegaly appreciated. Cor: PMI nondisplaced. Regular rate & rhythm. No rubs, gallops or murmurs. Lungs: clear Abdomen: soft, nontender, nondistended. No hepatosplenomegaly. No bruits or masses. Good bowel sounds. Extremities: no cyanosis, clubbing, rash, edema Neuro: alert & orientedx3, cranial nerves grossly intact.L side weak but improving . Affect pleasant  Stable from HF perspective on spiro and Entresto (started yesterday). Co-ox 66%. Will start low-dose b-blocker. CVP 5-7. Likely can add [place] soon.  Ok to go to floor. CIR consulted. Will need stroke rehab. Supp electrolytes.   Continue nocturnal TFs for now. Hopefully can wean soon. Ready for CIR over weekend if bed available.  Arvilla Meres, MD  12:24 PM

## 2020-02-24 NOTE — Progress Notes (Signed)
CARDIAC REHAB PHASE I   MI/stent education completed with pt and wife. Pt educated on importance of ASA and Plavix. Given MI book and stent card. Pt hopeful for CIR admission, so exercise guidelines deferred. Will refer to CRP II [place] to meet the requirements, pt and family understand this pends progress made.   [id number] Reynold Bowen, RN BSN 02/24/2020 1:51 PM

## 2020-02-24 NOTE — Progress Notes (Signed)
Physical Therapy Treatment Patient Details Name: Jeffrey Morse MRN: [id number] DOB: 1955/10/26 Today's Date: 02/24/2020    History of Present Illness 65 y.o. male hypertension, sleep apnea, hypercholesterolemia, was brought in for evaluation of what started like chest pain on 02/15/2020. Pt attempted to go to urgent care but collapsed when walking in and underwent CPR. Pt found to be in V fib by EMS, CPR of 15 minutes prior to ROSC. Pt went into PEA again in ambulance with further CPR of 5 minutes until ROSC. Pt was intubated and underwent emergent cath with successful stent placement to L circumflex artery and impella placement on 02/15/2020. 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  Pt was intubated 1/12- 1/18.  PT evaluated pt 1/15 and was total assist +2  but then signed off on 1/17 as pt was sedated and intubated and nurse asked PT to sign off. Reorder for PT on 1/19.    PT Comments    Pt is making good progress towards his goals today, however is somewhat frustrated at his current level of strength and mobility. Focus of session balance and standing in steady. Pt L-sided weakness continues to improve, however pt with difficulty finding balance with sitting on EoB, having difficulty finding neutral position. Pt is currently maxAx2 for bed mobility, and maxAx 2 for standing from EoB with Stedy. Pt able to progress to light minA for steadying with standing from elevated Stedy pads. Pt's wife continues to be present and is a very motivating force for pt to get back to PLOF. Pt needs CIR level rehab to regain. PT will continue to follow acutely.   Follow Up Recommendations  CIR;Supervision/Assistance - 24 hour     Equipment Recommendations  Wheelchair (measurements PT);Wheelchair cushion (measurements PT);Hospital bed;Other (comment)       Precautions / Restrictions Precautions Precautions: Fall Restrictions Weight Bearing Restrictions: No    Mobility  Bed  Mobility Overal bed mobility: Needs Assistance Bed Mobility: Supine to Sit     Supine to sit: +2 for physical assistance;HOB elevated;Max assist     General bed mobility comments: vc and min A for moving LE to EoB, with increased effort able to use R UE to reach for bedrail on L, requires modAx2 for bringing trunk to upright and maxAx2 to bring hips to EoB  Transfers Overall transfer level: Needs assistance   Transfers: Sit to/from Stand Sit to Stand: +2 physical assistance;Max assist;Mod assist;Min assist         General transfer comment: pt requires increased multimodal cuing for reaching UE to [facility] and for mechanics of sit>stand in [place], from EoB pt requires max A for coming to fully upright to close Stedy pads behind him. placed Stedy in front of recliner and able to perform 4x more sit<>stand from elevated Stedy pads. pt able to progress to light min A for steadying, pt very motivated to stand and kiss his wife     Modified Rankin (Stroke Patients Only) Modified Rankin (Stroke Patients Only) Pre-Morbid Rankin Score: No symptoms Modified Rankin: Severe disability     Balance Overall balance assessment: Needs assistance Sitting-balance support: Single extremity supported;Feet supported Sitting balance-Leahy Scale: Poor Sitting balance - Comments: Pt needed mod to min gaurd assist for sitting.  Pt progressed to min guard for brief seconds at a time. Postural control: Posterior lean Standing balance support: Bilateral upper extremity supported;During functional activity Standing balance-Leahy Scale: Zero Standing balance comment: able to come to fully standing however continues to  require UE support and light min A to maintain standing in [place]                            Cognition Arousal/Alertness: Awake/alert Behavior During Therapy: Restless;Impulsive Overall Cognitive Status: Impaired/Different from baseline Area of Impairment: Following  commands;Awareness;Safety/judgement;Orientation                       Following Commands: Follows one step commands with increased time;Follows multi-step commands with increased time Safety/Judgement: Decreased awareness of safety Awareness: Emergent   General Comments: very interactive today, increased command follow, has understanding of his deficits and is morning loss of L sided function, however continues to have poor understandin of how his deficits impact his safety      Exercises      General Comments General comments (skin integrity, edema, etc.): VSS on RA, wife present for session and is highly motivating for patient      Pertinent Vitals/Pain Faces Pain Scale: No hurt           PT Goals (current goals can now be found in the care plan section) Acute Rehab PT Goals Patient Stated Goal: wants to return to work PT Goal Formulation: Patient unable to participate in goal setting Time For Goal Achievement: 03/07/20 Potential to Achieve Goals: Fair Progress towards PT goals: Progressing toward goals    Frequency    Min 4X/week      PT Plan Current plan remains appropriate    Co-evaluation PT/OT/SLP Co-Evaluation/Treatment: Yes Reason for Co-Treatment: Complexity of the patient's impairments (multi-system involvement) PT goals addressed during session: Mobility/safety with mobility        AM-PAC PT "6 Clicks" Mobility   Outcome Measure  Help needed turning from your back to your side while in a flat bed without using bedrails?: A Lot Help needed moving from lying on your back to sitting on the side of a flat bed without using bedrails?: A Lot Help needed moving to and from a bed to a chair (including a wheelchair)?: Total Help needed standing up from a chair using your arms (e.g., wheelchair or bedside chair)?: Total Help needed to walk in hospital room?: Total Help needed climbing 3-5 steps with a railing? : Total 6 Click Score: 8    End of  Session Equipment Utilized During Treatment: Oxygen Activity Tolerance: Patient tolerated treatment well Patient left: with call bell/phone within reach;in chair;with family/visitor present Nurse Communication: Mobility status;Need for lift equipment PT Visit Diagnosis: Other abnormalities of gait and mobility (R26.89);Other symptoms and signs involving the nervous system (A25.053)     Time: 9767-3419 PT Time Calculation (min) (ACUTE ONLY): 34 min  Charges:  $Therapeutic Activity: 8-22 mins                     Ruhi Kopke B. Beverely Risen PT, DPT Acute Rehabilitation Services Pager [phone] Office [phone]    Elon Alas Fleet 02/24/2020, 11:57 AM

## 2020-02-24 NOTE — Progress Notes (Signed)
Occupational Therapy Treatment Patient Details Name: Jeffrey Morse MRN: [id number] DOB: [birth year] Today's Date: 02/24/2020    History of present illness 65 y.o. male hypertension, sleep apnea, hypercholesterolemia, was brought in for evaluation of what started like chest pain on 02/15/2020. Pt attempted to go to urgent care but collapsed when walking in and underwent CPR. Pt found to be in V fib by EMS, CPR of 15 minutes prior to ROSC. Pt went into PEA again in ambulance with further CPR of 5 minutes until ROSC. Pt was intubated and underwent emergent cath with successful stent placement to L circumflex artery and impella placement on 02/15/2020. 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  Pt was intubated 1/12- 1/18.  PT evaluated pt 1/15 and was total assist +2  but then signed off on 1/17 as pt was sedated and intubated and nurse asked PT to sign off. Reorder for PT on 1/19.   OT comments  Patient with good participation and continues with slow, but steady progress.  Patient able to sit edge of bed with +2 for core strength, balance training, and mid line sitting.  L upper extremity continues to show noce progress with AROM.  Patient able to perform several sit to stands in the stedy for balance, stand tolerance and greater independence with toileting.  OT will continue to see him in the acute setting.  CIR continues to be recommended.    Follow Up Recommendations  CIR    Equipment Recommendations  3 in 1 bedside commode;Tub/shower seat;Wheelchair (measurements OT);Wheelchair cushion (measurements OT)    Recommendations for Other Services      Precautions / Restrictions Precautions Precautions: Fall Precaution Comments: foley, rectal tube, NG tube. Restrictions Weight Bearing Restrictions: No       Mobility Bed Mobility Overal bed mobility: Needs Assistance Bed Mobility: Supine to Sit     Supine to sit: +2 for physical assistance;HOB elevated;Max assist      General bed mobility comments: vc and min A for moving LE to EoB, with increased effort able to use R UE to reach for bedrail on L, requires modAx2 for bringing trunk to upright and maxAx2 to bring hips to EoB  Transfers Overall transfer level: Needs assistance   Transfers: Sit to/from Stand Sit to Stand: +2 physical assistance;Max assist;Mod assist;Min assist         General transfer comment: pt requires increased multimodal cuing for reaching UE to [facility] and for mechanics of sit>stand in [place], from EoB pt requires max A for coming to fully upright to close Stedy pads behind him. placed Stedy in front of recliner and able to perform 4x more sit<>stand from elevated Stedy pads. pt able to progress to light min A for steadying, pt very motivated to stand and kiss his wife    Balance Overall balance assessment: Needs assistance Sitting-balance support: Single extremity supported;Feet supported Sitting balance-Leahy Scale: Poor Sitting balance - Comments: Pt needed mod to min gaurd assist for sitting.  Pt progressed to min guard for brief seconds at a time. Postural control: Posterior lean Standing balance support: Bilateral upper extremity supported;During functional activity Standing balance-Leahy Scale: Zero Standing balance comment: able to come to fully standing however continues to require UE support and light min A to maintain standing in [place]                           ADL either performed or assessed with clinical judgement  ADL Overall ADL's : Needs assistance/impaired Eating/Feeding: NPO   Grooming: Wash/dry hands;Wash/dry face;Sitting;Minimal assistance   Upper Body Bathing: Maximal assistance;Bed level   Lower Body Bathing: Total assistance;Bed level   Upper Body Dressing : Bed level;Maximal assistance   Lower Body Dressing: Total assistance;Bed level   Toilet Transfer: +2 for physical assistance;Moderate assistance;BSC Toilet Transfer Details  (indicate cue type and reason): with the stedy Toileting- Clothing Manipulation and Hygiene: Total assistance;Bed level                                 Cognition Arousal/Alertness: Awake/alert Behavior During Therapy: Restless;Impulsive Overall Cognitive Status: Impaired/Different from baseline Area of Impairment: Following commands;Awareness;Safety/judgement;Orientation                       Following Commands: Follows one step commands with increased time;Follows multi-step commands with increased time Safety/Judgement: Decreased awareness of safety Awareness: Emergent   General Comments: very interactive today, increased command follow, has understanding of his deficits and is morning loss of L sided function, however continues to have poor understandin of how his deficits impact his safety        Exercises     Shoulder Instructions       General Comments VSS on RA, wife present for session and is highly motivating for patient    Pertinent Vitals/ Pain       Pain Assessment: No/denies pain Faces Pain Scale: No hurt                                                          Frequency  Min 2X/week        Progress Toward Goals  OT Goals(current goals can now be found in the care plan section)  Progress towards OT goals: Progressing toward goals  Acute Rehab OT Goals Patient Stated Goal: wants to return to work OT Goal Formulation: With patient Time For Goal Achievement: 03/03/20 Potential to Achieve Goals: Fair  Plan Discharge plan remains appropriate    Co-evaluation    PT/OT/SLP Co-Evaluation/Treatment: Yes Reason for Co-Treatment: Complexity of the patient's impairments (multi-system involvement) PT goals addressed during session: Mobility/safety with mobility OT goals addressed during session: ADL's and self-care;Strengthening/ROM      AM-PAC OT "6 Clicks" Daily Activity     Outcome Measure   Help from  another person eating meals?: Total Help from another person taking care of personal grooming?: A Lot Help from another person toileting, which includes using toliet, bedpan, or urinal?: Total Help from another person bathing (including washing, rinsing, drying)?: A Lot Help from another person to put on and taking off regular upper body clothing?: A Lot Help from another person to put on and taking off regular lower body clothing?: Total 6 Click Score: 9    End of Session Equipment Utilized During Treatment: Gait belt  OT Visit Diagnosis: Other symptoms and signs involving the nervous system (R29.898)   Activity Tolerance Patient tolerated treatment well   Patient Left in chair;with call bell/phone within reach;with family/visitor present   Nurse Communication Need for lift equipment        Time: 9357-0177 OT Time Calculation (min): 37 min  Charges: OT General Charges $OT Visit: 1 Visit OT Treatments $  Self Care/Home Management : 8-22 mins  02/24/2020  Rich, OTR/L  Acute Rehabilitation Services  Office:  [phone]    Suzanna Obey 02/24/2020, 12:37 PM

## 2020-02-25 LAB — BASIC METABOLIC PANEL
Anion gap: 10 (ref 5–15)
BUN: 20 mg/dL (ref 8–23)
CO2: 21 mmol/L — ABNORMAL LOW (ref 22–32)
Calcium: 9 mg/dL (ref 8.9–10.3)
Chloride: 105 mmol/L (ref 98–111)
Creatinine, Ser: 0.7 mg/dL (ref 0.61–1.24)
GFR, Estimated: >60 mL/min (ref >60)
Glucose, Bld: 202 mg/dL — ABNORMAL HIGH (ref 70–99)
Potassium: 4.4 mmol/L (ref 3.5–5.1)
Sodium: 136 mmol/L (ref 135–145)

## 2020-02-25 LAB — GLUCOSE, CAPILLARY
Glucose-Capillary: 189 mg/dL — ABNORMAL HIGH (ref 70–99)
Glucose-Capillary: 218 mg/dL — ABNORMAL HIGH (ref 70–99)
Glucose-Capillary: 240 mg/dL — ABNORMAL HIGH (ref 70–99)
Glucose-Capillary: 246 mg/dL — ABNORMAL HIGH (ref 70–99)
Glucose-Capillary: 253 mg/dL — ABNORMAL HIGH (ref 70–99)
Glucose-Capillary: 263 mg/dL — ABNORMAL HIGH (ref 70–99)

## 2020-02-25 LAB — CBC
HCT: 32.4 % — ABNORMAL LOW (ref 39.0–52.0)
Hemoglobin: 10.7 g/dL — ABNORMAL LOW (ref 13.0–17.0)
MCH: 29.1 pg (ref 26.0–34.0)
MCHC: 33 g/dL (ref 30.0–36.0)
MCV: 88 fL (ref 80.0–100.0)
Platelets: 523 10*3/uL — ABNORMAL HIGH (ref 150–400)
RBC: 3.68 MIL/uL — ABNORMAL LOW (ref 4.22–5.81)
RDW: 15.1 % (ref 11.5–15.5)
WBC: 11.7 10*3/uL — ABNORMAL HIGH (ref 4.0–10.5)
nRBC: 0 % (ref 0.0–0.2)

## 2020-02-25 MED ORDER — SPIRONOLACTONE 25 MG PO TABS
25.0000 mg | ORAL_TABLET | Freq: Every day | ORAL | Status: DC
  Administered 2020-02-26 – 2020-02-28 (×3): 25 mg
  Filled 2020-02-25 (×3): qty 1

## 2020-02-25 MED ORDER — PANTOPRAZOLE SODIUM 40 MG PO PACK
40.0000 mg | PACK | Freq: Every day | ORAL | Status: DC
  Administered 2020-02-26 – 2020-02-28 (×3): 40 mg
  Filled 2020-02-25 (×3): qty 20

## 2020-02-25 MED ORDER — SACUBITRIL-VALSARTAN 24-26 MG PO TABS
1.0000 | ORAL_TABLET | Freq: Two times a day (BID) | ORAL | Status: DC
  Administered 2020-02-25 – 2020-02-28 (×6): 1
  Filled 2020-02-25 (×6): qty 1

## 2020-02-25 MED ORDER — ENSURE ENLIVE PO LIQD
237.0000 mL | Freq: Two times a day (BID) | ORAL | Status: DC
  Administered 2020-02-26 – 2020-02-28 (×5): 237 mL

## 2020-02-25 MED ORDER — CARVEDILOL 3.125 MG PO TABS
3.1250 mg | ORAL_TABLET | Freq: Two times a day (BID) | ORAL | Status: DC
  Administered 2020-02-25 – 2020-02-28 (×6): 3.125 mg
  Filled 2020-02-25 (×6): qty 1

## 2020-02-25 MED ORDER — INSULIN DETEMIR 100 UNIT/ML ~~LOC~~ SOLN
5.0000 [IU] | Freq: Every day | SUBCUTANEOUS | Status: DC
  Administered 2020-02-26 – 2020-02-28 (×3): 5 [IU] via SUBCUTANEOUS
  Filled 2020-02-25 (×3): qty 0.05

## 2020-02-25 MED ORDER — POTASSIUM CHLORIDE 20 MEQ PO PACK
40.0000 meq | PACK | Freq: Every day | ORAL | Status: DC
  Administered 2020-02-25: 40 meq via ORAL
  Filled 2020-02-25: qty 2

## 2020-02-25 MED ORDER — CLOPIDOGREL BISULFATE 75 MG PO TABS
75.0000 mg | ORAL_TABLET | Freq: Every day | ORAL | Status: DC
  Administered 2020-02-26 – 2020-02-28 (×3): 75 mg
  Filled 2020-02-25 (×3): qty 1

## 2020-02-25 NOTE — Plan of Care (Signed)
  Problem: Activity: Goal: Ability to tolerate increased activity will improve Outcome: Progressing   Problem: Respiratory: Goal: Ability to maintain a clear airway and adequate ventilation will improve Outcome: Progressing   Problem: Role Relationship: Goal: Method of communication will improve Outcome: Progressing   Problem: Education: Goal: Understanding of CV disease, CV risk reduction, and recovery process will improve Outcome: Progressing Goal: Individualized Educational Video(s) Outcome: Progressing   Problem: Activity: Goal: Ability to return to baseline activity level will improve Outcome: Progressing   Problem: Cardiovascular: Goal: Ability to achieve and maintain adequate cardiovascular perfusion will improve Outcome: Progressing Goal: Vascular access site(s) Level 0-1 will be maintained Outcome: Progressing   Problem: Health Behavior/Discharge Planning: Goal: Ability to safely manage health-related needs after discharge will improve Outcome: Progressing   Problem: Education: Goal: Knowledge of General Education information will improve Description: Including pain rating scale, medication(s)/side effects and non-pharmacologic comfort measures Outcome: Progressing   Problem: Health Behavior/Discharge Planning: Goal: Ability to manage health-related needs will improve Outcome: Progressing   Problem: Clinical Measurements: Goal: Ability to maintain clinical measurements within normal limits will improve Outcome: Progressing Goal: Will remain free from infection Outcome: Progressing Goal: Diagnostic test results will improve Outcome: Progressing Goal: Respiratory complications will improve Outcome: Progressing Goal: Cardiovascular complication will be avoided Outcome: Progressing   Problem: Activity: Goal: Risk for activity intolerance will decrease Outcome: Progressing   Problem: Nutrition: Goal: Adequate nutrition will be maintained Outcome:  Progressing   Problem: Coping: Goal: Level of anxiety will decrease Outcome: Progressing   Problem: Elimination: Goal: Will not experience complications related to bowel motility Outcome: Progressing Goal: Will not experience complications related to urinary retention Outcome: Progressing   Problem: Pain Managment: Goal: General experience of comfort will improve Outcome: Progressing   Problem: Safety: Goal: Ability to remain free from injury will improve Outcome: Progressing   Problem: Skin Integrity: Goal: Risk for impaired skin integrity will decrease Outcome: Progressing   Problem: Education: Goal: Knowledge of disease or condition will improve Outcome: Progressing Goal: Knowledge of secondary prevention will improve Outcome: Progressing Goal: Knowledge of patient specific risk factors addressed and post discharge goals established will improve Outcome: Progressing Goal: Individualized Educational Video(s) Outcome: Progressing   Problem: Coping: Goal: Will verbalize positive feelings about self Outcome: Progressing Goal: Will identify appropriate support needs Outcome: Progressing   Problem: Health Behavior/Discharge Planning: Goal: Ability to manage health-related needs will improve Outcome: Progressing   Problem: Self-Care: Goal: Ability to participate in self-care as condition permits will improve Outcome: Progressing Goal: Verbalization of feelings and concerns over difficulty with self-care will improve Outcome: Progressing Goal: Ability to communicate needs accurately will improve Outcome: Progressing   Problem: Nutrition: Goal: Risk of aspiration will decrease Outcome: Progressing Goal: Dietary intake will improve Outcome: Progressing   Problem: Intracerebral Hemorrhage Tissue Perfusion: Goal: Complications of Intracerebral Hemorrhage will be minimized Outcome: Progressing

## 2020-02-25 NOTE — Plan of Care (Signed)
  Problem: Activity: Goal: Ability to tolerate increased activity will improve Outcome: Progressing   Problem: Role Relationship: Goal: Method of communication will improve Outcome: Progressing   Problem: Education: Goal: Understanding of CV disease, CV risk reduction, and recovery process will improve Outcome: Progressing Goal: Individualized Educational Video(s) Outcome: Progressing   Problem: Activity: Goal: Ability to return to baseline activity level will improve Outcome: Progressing   Problem: Cardiovascular: Goal: Ability to achieve and maintain adequate cardiovascular perfusion will improve Outcome: Progressing Goal: Vascular access site(s) Level 0-1 will be maintained Outcome: Progressing   Problem: Education: Goal: Knowledge of General Education information will improve Description: Including pain rating scale, medication(s)/side effects and non-pharmacologic comfort measures Outcome: Progressing   Problem: Health Behavior/Discharge Planning: Goal: Ability to manage health-related needs will improve Outcome: Progressing   Problem: Clinical Measurements: Goal: Ability to maintain clinical measurements within normal limits will improve Outcome: Progressing Goal: Will remain free from infection Outcome: Progressing Goal: Diagnostic test results will improve Outcome: Progressing Goal: Respiratory complications will improve Outcome: Progressing Goal: Cardiovascular complication will be avoided Outcome: Progressing   Problem: Activity: Goal: Risk for activity intolerance will decrease Outcome: Progressing   Problem: Nutrition: Goal: Adequate nutrition will be maintained Outcome: Progressing   Problem: Coping: Goal: Level of anxiety will decrease Outcome: Progressing   Problem: Elimination: Goal: Will not experience complications related to bowel motility Outcome: Progressing Goal: Will not experience complications related to urinary retention Outcome:  Progressing   Problem: Pain Managment: Goal: General experience of comfort will improve Outcome: Progressing   Problem: Safety: Goal: Ability to remain free from injury will improve Outcome: Progressing   Problem: Skin Integrity: Goal: Risk for impaired skin integrity will decrease Outcome: Progressing   Problem: Coping: Goal: Will verbalize positive feelings about self Outcome: Progressing Goal: Will identify appropriate support needs Outcome: Progressing   Problem: Self-Care: Goal: Ability to participate in self-care as condition permits will improve Outcome: Progressing Goal: Verbalization of feelings and concerns over difficulty with self-care will improve Outcome: Progressing Goal: Ability to communicate needs accurately will improve Outcome: Progressing   Problem: Nutrition: Goal: Risk of aspiration will decrease Outcome: Progressing Goal: Dietary intake will improve Outcome: Progressing   Problem: Intracerebral Hemorrhage Tissue Perfusion: Goal: Complications of Intracerebral Hemorrhage will be minimized Outcome: Progressing

## 2020-02-25 NOTE — Treatment Plan (Addendum)
23:00:  Glucose: 195 ->196->230->255->202; received a total of 29 units of the last 24H; A1c 6.1% will add low dose levemir to SSI, may need to titrate up on levemir dosing.   Addendum 2:56: Given melatonin, still awake. QTC on tele appears to be about 450.  Will give small dose of trazodone for sleep.   Addendum 4:06:  Agitated, alert and oriented x 4, appears hyperactive delirium (thinks wearing pants).  Seroquel and haldol may be a better option but due to hx of prolong QT arrest.  Will trial small dose of ativan.

## 2020-02-25 NOTE — Progress Notes (Signed)
Progress Note  Patient Name: Jeffrey Morse Date of Encounter: 02/25/2020  [facility] Cardiologist: Bensimhon  Subjective   No complaints  Inpatient Medications    Scheduled Meds: . aspirin  81 mg Oral Daily  . carvedilol  3.125 mg Oral BID WC  . Chlorhexidine Gluconate Cloth  6 each Topical Daily  . clopidogrel  75 mg Oral Daily  . feeding supplement  237 mL Oral BID BM  . feeding supplement (PROSource TF)  45 mL Per Tube QID  . fiber  1 packet Per Tube BID  . insulin aspart  0-15 Units Subcutaneous TID AC & HS  . mouth rinse  15 mL Mouth Rinse BID  . nystatin  5 mL Mouth/Throat QID  . pantoprazole  40 mg Oral Daily  . potassium chloride  40 mEq Oral Daily  . sacubitril-valsartan  1 tablet Oral BID  . spironolactone  25 mg Oral Daily  . tamsulosin  0.4 mg Oral QPC supper   Continuous Infusions: . ceFEPime (MAXIPIME) IV 2 g (02/25/20 0523)  . feeding supplement (OSMOLITE 1.5 CAL) 85 mL/hr at 02/25/20 0700   PRN Meds: acetaminophen **OR** acetaminophen (TYLENOL) oral liquid 160 mg/5 mL **OR** acetaminophen, melatonin   Vital Signs    Vitals:   02/25/20 0300 02/25/20 0400 02/25/20 0500 02/25/20 0700  BP: (!) 122/92 130/85 (!) 147/85 140/84  Pulse: 72 92 87 91  Resp: (!) 41 (!) 21 (!) 42 20  Temp:  97.8 F (36.6 C)  98 F (36.7 C)  TempSrc:  Oral    SpO2: 98% 98% 98% 98%  Weight:   99.1 kg   Height:        Intake/Output Summary (Last 24 hours) at 02/25/2020 0845 Last data filed at 02/25/2020 0700 Gross per 24 hour  Intake 1270 ml  Output 3550 ml  Net -2280 ml   Last 3 Weights 02/25/2020 02/24/2020 02/23/2020  Weight (lbs) 218 lb 7.6 oz 217 lb 9.5 oz 215 lb 13.3 oz  Weight (kg) 99.1 kg 98.7 kg 97.9 kg      Telemetry    SR - Personally Reviewed  ECG    n/a - Personally Reviewed  Physical Exam   GEN: No acute distress.   Neck: No JVD Cardiac: RRR, no murmurs, rubs, or gallops.  Respiratory: Clear to auscultation bilaterally. GI: Soft,  nontender, non-distended  MS: No edema; No deformity. Neuro:  left sided weakness Psych: Normal affect   Labs    High Sensitivity Troponin:   Recent Labs  Lab 02/15/20 1114 02/15/20 1523  TROPONINIHS 195* 4,374*      Chemistry Recent Labs  Lab 02/22/20 1557 02/23/20 0355 02/24/20 0404  NA 146* 142 139  K 3.3* 3.6 3.9  CL 112* 109 108  CO2 23 23 21*  GLUCOSE 196* 230* 255*  BUN 27* 25* 18  CREATININE 0.83 0.81 0.67  CALCIUM 8.5* 8.1* 7.9*  PROT  --   --  5.7*  ALBUMIN  --   --  2.2*  AST  --   --  133*  ALT  --   --  110*  ALKPHOS  --   --  141*  BILITOT  --   --  0.9  GFRNONAA >60 >60 >60  ANIONGAP 11 10 10      Hematology Recent Labs  Lab 02/22/20 0456 02/22/20 1556 02/23/20 0355 02/24/20 0404  WBC 9.2  --  9.8 9.3  RBC 3.55*  --  2.93* 3.34*  HGB 10.3* 9.9*  8.7* 9.5*  HCT 31.5* 29.0* 26.0* 30.0*  MCV 88.7  --  88.7 89.8  MCH 29.0  --  29.7 28.4  MCHC 32.7  --  33.5 31.7  RDW 14.3  --  14.9 15.1  PLT 271  --  284 355    BNPNo results for input(s): BNP, PROBNP in the last 168 hours.   DDimer No results for input(s): DDIMER in the last 168 hours.   Radiology    No results found.  Cardiac Studies    Patient Profile     65 y.o. male admitted with VF arrest, s/p PCI to LCX. Admissoin complicated by acute left sided weakness and CVA  Assessment & Plan    1. CAD/VF arrest - admitted Jan 12 with VF arrest in setting of acute inferolateral STEMI - cath occluded LCX, received DES. Impella support later weaned off - recurrent VT arrest Jan 14, transiently on amio now off - LVgram EF 25%, with subsequent stabilization echo LVEF 50-55%, posterior wall hypokinesis  - medical therapy with ASA 81,coreg 3.125mg  bid, plavix 75, entrsto 24/26mg  bid, aldactone 25mg   - coox has been stable, will not repeat   2. CVA - developed acute left sided weakness  - MRI showed multiple small infarcts, small ICH - followed by neuro - dysphagia, has cor trak with  tube feeds managemd by dietary. Working on regular dieatery intake but still inadquate to maintain nutrition.  - awaiting inpatient rehab  3. Ileus - resolved  4. Fever - febrile Jan 16, pan cx was negative - mild temp overnight 100.8, normal WBC yesterday  - notes mention possible HCAP vs aspiration pneumonia, was initially on ceftriazone but remained febrile, changed to vanc/cefepime on Jan 18. Continue abx course, review planned duration   Awaiting floor bed   For questions or updates, please contact [facility] Please consult [email or web address] for contact info under        Signed, Jan 20, MD  02/25/2020, 8:45 AM

## 2020-02-26 ENCOUNTER — Inpatient Hospital Stay (HOSPITAL_COMMUNITY): Payer: BC Managed Care – PPO

## 2020-02-26 DIAGNOSIS — J9601 Acute respiratory failure with hypoxia: Secondary | ICD-10-CM | POA: Diagnosis not present

## 2020-02-26 DIAGNOSIS — G9340 Encephalopathy, unspecified: Secondary | ICD-10-CM | POA: Diagnosis not present

## 2020-02-26 DIAGNOSIS — I6330 Cerebral infarction due to thrombosis of unspecified cerebral artery: Secondary | ICD-10-CM | POA: Diagnosis not present

## 2020-02-26 LAB — GLUCOSE, CAPILLARY
Glucose-Capillary: 107 mg/dL — ABNORMAL HIGH (ref 70–99)
Glucose-Capillary: 179 mg/dL — ABNORMAL HIGH (ref 70–99)
Glucose-Capillary: 179 mg/dL — ABNORMAL HIGH (ref 70–99)
Glucose-Capillary: 194 mg/dL — ABNORMAL HIGH (ref 70–99)
Glucose-Capillary: 222 mg/dL — ABNORMAL HIGH (ref 70–99)
Glucose-Capillary: 263 mg/dL — ABNORMAL HIGH (ref 70–99)
Glucose-Capillary: 268 mg/dL — ABNORMAL HIGH (ref 70–99)

## 2020-02-26 LAB — POCT I-STAT 7, (LYTES, BLD GAS, ICA,H+H)
Acid-Base Excess: 1 mmol/L (ref 0.0–2.0)
Bicarbonate: 24.2 mmol/L (ref 20.0–28.0)
Calcium, Ion: 1.24 mmol/L (ref 1.15–1.40)
HCT: 32 % — ABNORMAL LOW (ref 39.0–52.0)
Hemoglobin: 10.9 g/dL — ABNORMAL LOW (ref 13.0–17.0)
O2 Saturation: 94 %
Patient temperature: 97.9
Potassium: 4.6 mmol/L (ref 3.5–5.1)
Sodium: 136 mmol/L (ref 135–145)
TCO2: 25 mmol/L (ref 22–32)
pCO2 arterial: 31.7 mmHg — ABNORMAL LOW (ref 32.0–48.0)
pH, Arterial: 7.489 — ABNORMAL HIGH (ref 7.350–7.450)
pO2, Arterial: 62 mmHg — ABNORMAL LOW (ref 83.0–108.0)

## 2020-02-26 LAB — BASIC METABOLIC PANEL
Anion gap: 9 (ref 5–15)
BUN: 21 mg/dL (ref 8–23)
CO2: 23 mmol/L (ref 22–32)
Calcium: 8.8 mg/dL — ABNORMAL LOW (ref 8.9–10.3)
Chloride: 100 mmol/L (ref 98–111)
Creatinine, Ser: 0.78 mg/dL (ref 0.61–1.24)
GFR, Estimated: >60 mL/min (ref >60)
Glucose, Bld: 290 mg/dL — ABNORMAL HIGH (ref 70–99)
Potassium: 4.5 mmol/L (ref 3.5–5.1)
Sodium: 132 mmol/L — ABNORMAL LOW (ref 135–145)

## 2020-02-26 LAB — CBC
HCT: 33.7 % — ABNORMAL LOW (ref 39.0–52.0)
Hemoglobin: 11.1 g/dL — ABNORMAL LOW (ref 13.0–17.0)
MCH: 29 pg (ref 26.0–34.0)
MCHC: 32.9 g/dL (ref 30.0–36.0)
MCV: 88 fL (ref 80.0–100.0)
Platelets: 567 10*3/uL — ABNORMAL HIGH (ref 150–400)
RBC: 3.83 MIL/uL — ABNORMAL LOW (ref 4.22–5.81)
RDW: 14.7 % (ref 11.5–15.5)
WBC: 12.1 10*3/uL — ABNORMAL HIGH (ref 4.0–10.5)
nRBC: 0 % (ref 0.0–0.2)

## 2020-02-26 MED ORDER — LORAZEPAM 2 MG/ML IJ SOLN
1.0000 mg | Freq: Once | INTRAMUSCULAR | Status: AC
  Administered 2020-02-26: 1 mg via INTRAVENOUS
  Filled 2020-02-26: qty 1

## 2020-02-26 MED ORDER — VALPROIC ACID 250 MG/5ML PO SOLN
250.0000 mg | Freq: Two times a day (BID) | ORAL | Status: DC
  Administered 2020-02-26 – 2020-02-28 (×5): 250 mg
  Filled 2020-02-26 (×6): qty 5

## 2020-02-26 MED ORDER — TRAZODONE HCL 50 MG PO TABS
50.0000 mg | ORAL_TABLET | Freq: Once | ORAL | Status: AC
  Administered 2020-02-26: 50 mg via ORAL
  Filled 2020-02-26: qty 1

## 2020-02-26 MED ORDER — QUETIAPINE 12.5 MG HALF TABLET
12.5000 mg | ORAL_TABLET | Freq: Every day | ORAL | Status: DC
  Administered 2020-02-26: 12.5 mg via ORAL
  Filled 2020-02-26: qty 1

## 2020-02-26 MED ORDER — LORAZEPAM 2 MG/ML IJ SOLN
INTRAMUSCULAR | Status: AC
  Administered 2020-02-26: 1 mg via INTRAVENOUS
  Filled 2020-02-26: qty 1

## 2020-02-26 MED ORDER — OXYCODONE HCL 5 MG PO TABS: 5.0000 mg | ORAL_TABLET | Freq: Once | ORAL | Status: DC | PRN

## 2020-02-26 MED ORDER — DEXMEDETOMIDINE HCL IN NACL 400 MCG/100ML IV SOLN
0.2000 ug/kg/h | INTRAVENOUS | Status: DC
  Administered 2020-02-26: 0.2 ug/kg/h via INTRAVENOUS
  Filled 2020-02-26: qty 100

## 2020-02-26 MED ORDER — DIVALPROEX SODIUM ER 500 MG PO TB24: 500.0000 mg | ORAL_TABLET | Freq: Every day | ORAL | Status: DC

## 2020-02-26 MED ORDER — LORAZEPAM 2 MG/ML IJ SOLN: 1.0000 mg | Freq: Once | INTRAMUSCULAR | Status: AC

## 2020-02-26 NOTE — Progress Notes (Signed)
STROKE TEAM PROGRESS NOTE     INTERVAL HISTORY Asked to see pt again for delerium/agitation. Had signed off 02/23/20. Head CT 01/25/21 - no acute findings.  Patient required Precedex drip last night but resolved this morning.  He remains confused and disoriented.  Basic labs appear unremarkable.  Vital signs stable.  Vitals:   02/26/20 0729 02/26/20 0800 02/26/20 0900 02/26/20 1000  BP:  124/79 121/65 111/85  Pulse:  87 86 84  Resp:  (!) 38 (!) 27 (!) 31  Temp: 97.8 F (36.6 C)     TempSrc: Axillary     SpO2:  95% 95% 98%  Weight:      Height:       CBC:  Recent Labs  Lab 02/25/20 1046 02/26/20 0607 02/26/20 0857  WBC 11.7*  --  12.1*  HGB 10.7* 10.9* 11.1*  HCT 32.4* 32.0* 33.7*  MCV 88.0  --  88.0  PLT 523*  --  567*   Basic Metabolic Panel:  Recent Labs  Lab 02/23/20 0355 02/24/20 0404 02/25/20 1046 02/26/20 0607 02/26/20 0857  NA 142 139 136 136 132*  K 3.6 3.9 4.4 4.6 4.5  CL 109 108 105  --  100  CO2 23 21* 21*  --  23  GLUCOSE 230* 255* 202*  --  290*  BUN 25* 18 20  --  21  CREATININE 0.81 0.67 0.70  --  0.78  CALCIUM 8.1* 7.9* 9.0  --  8.8*  MG 2.0 1.9  --   --   --   PHOS 2.7 3.0  --   --   --    Lipid Panel:  No results for input(s): CHOL, TRIG, HDL, CHOLHDL, VLDL, LDLCALC in the last 168 hours. HgbA1c:  No results for input(s): HGBA1C in the last 168 hours.  IMAGING  VAS [place] Lower extremity 02/21/2020 Summary:  BILATERAL:  - No evidence of deep vein thrombosis seen in the lower extremities,  bilaterally.  - No evidence of superficial venous thrombosis in the lower extremities,  bilaterally.  -No evidence of popliteal cyst, bilaterally.   CT Head wo contrast 02/20/2019 IMPRESSION: 1. Unchanged punctate focus of blood at the right thalamus. 2. Faint hypoattenuation in the frontal lobes correspond to known recent infarcts.  CT Head Wo Contrast 02/17/2020 IMPRESSION:  1. Subcentimeter hyperdense focus within the right thalamic region,  suspicious for a small acute bleed. MRI correlation is recommended.  2. Mild cerebral atrophy.  3. Moderate to marked severity paranasal sinus disease.   CT Head Wo Contrast 02/26/2020 IMPRESSION: 1. Motion degraded head CT without new abnormality or reversible finding. 2. Improved sinus inflammation compared to 6 days ago.  MR BRAIN WO CONTRAST MR ANGIO HEAD WO CONTRAST 02/17/2020 IMPRESSION:  1. Multiple small acute infarcts within the frontal lobes.  2. New right thalamic microhemorrhage, since 01/18/12.  3. Normal intracranial MRA.   DG CHEST PORT 1 VIEW 02/16/2020 IMPRESSION:  Low lung volumes persist, with likely atelectasis at the lung bases and no overt edema or confluent airspace disease. Unchanged position of endotracheal tube, gastric tube, right IJ sheath/Swan-Ganz.   DG Abd Portable 1V 02/17/2020 IMPRESSION:  Enteric tube in satisfactory position, nonobstructive bowel gas pattern.   ECHOCARDIOGRAM LIMITED 02/16/2020 IMPRESSIONS   1. Left ventricular ejection fraction, by estimation, is 50 to 55%. The left ventricle has mildly decreased function. The left ventricle demonstrates regional wall motion abnormalities. Posterior wall hypokinesis.   2. Impella measures about 3cm into LV cavity from aortic valve  3. Right ventricular systolic function is normal. The right ventricular size is normal.   Bilateral Carotid Dopplers  02/18/2020 Bilateral mild thickening without hemodynamically significant stenosis.  Vertebral artery flow is antegrade on the left not visualized on the right.  VAS [place] upper extremity Venous Duplex Right 02/20/2020  Summary:    Right:  No evidence of deep vein thrombosis in the upper extremity. Findings  consistent  with acute superficial vein thrombosis involving the right cephalic vein.    Left:  No evidence of thrombosis in the subclavian.   PHYSICAL EXAM  General: Well-developed well-nourished male who is not in distress.    HEENT: [place]/AT, endotracheal tube in place Cardiovascular: Rate and rhythm Pulmonary: Clear to auscultation bilaterally Abdomen: Soft, nondistended, nontender Extremities: No peripheral edema Neurological:  Mental Status:  Alert, awake, oriented to  Person only.  Diminished attention, registration and recall.  Poor insight into his illness.  Easy distractibility..  Follows only simple midline and one-step commands.  Cranial Nerves: II III,IV, VI: pupils equal, round, reactive to light and accommodation, slight skew deviation with right eye hypertropia.  V,VII: normal VIII: hearing normal  IX,X: Uvula rises symmetrically XI: Unable to perform XII: Midline tongue extension without atrophy or fasciculations  Motor: Right :  Upper extremity   4/5                                      Left:     Upper extremity   1/5             Lower extremity   4/5                                                  Lower extremity   1/5 Tone and bulk:normal tone throughout; no atrophy noted Bilateral upper extremity action tremor noted.  No flaps or asterixis. Sensory: Pinprick and light touch normal on in RUE and RLE, decreased on LUE and LLE Deep Tendon Reflexes:  Right: Upper Extremity                       Left: Upper extremity   biceps (C-5 to C-6) 2/4                       biceps (C-5 to C-6) 3/4 tricep (C7) 2/4                                     triceps (C7) 3/4 Brachioradialis (C6) 2/4                      Brachioradialis (C6) 3/4  Lower Extremity Lower Extremity  quadriceps (L-2 to L-4) 2/4                 quadriceps (L-2 to L-4) 3/4 Achilles (S1) 2/4                                  Achilles (S1) 3/4  Plantars: Right: downgoing  Left: downgoing Cerebellar: Unable to perform finger-to-nose,  unable to perform heel-to-shin test Gait: Deferred   ASSESSMENT/PLAN Jeffrey Morse is a 65 y.o. male with history of hypertension, hyperlipidemia and OSA  presenting 02/15/20 with cardiac arrest with successful PCI and drug-eluting stent placement left circumflex complicated by hypotension (Impella device) with subsequent left sided weakness. He did not receive IV t-PA due to late presentation (>4.5 hours from time of onset).   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  As well as embolization from distal clot on the Impella device  persistent altered mental status likely multifactorial encephalopathy due to ongoing febrile illness as well as medication effect of sedating medications  CT head: Subcentimeter hyperdense focus within the right thalamic region, suspicious for a small acute bleed. MRI correlation is recommended. Mild cerebral atrophy.   CT Head 02/26/20 -  Motion degraded head CT without new abnormality or reversible finding. Improved sinus inflammation compared to 6 days ago.  MRI : Multiple small acute infarcts within the frontal lobes. New right thalamic microhemorrhage, since 01/18/12.  MRA : Normal intracranial MRA.  Carotid Doppler: Bilateral mild thickening without hemodynamically significant stenosis.  Vertebral artery flow is antegrade on the left not visualized on the right.  2D Echo: EF 50 - 55%. No cardiac source of emboli identified.   VAS [place] upper extremity: Acute superficial vein thrombosis involving the right cephalic vein.  Sars Corona Virus 2 - negative  LDL 135  HgbA1c 6.1  VTE prophylaxis - none    Diet   DIET DYS 2 Room service appropriate? Yes with Assist; Fluid consistency: Thin    No antithrombotic prior to admission, now on aspirin 81 mg daily and clopidogrel 75 mg daily.   Patient will be counseled to be compliant with his antithrombotic medications  Ongoing aggressive stroke risk factor management  Therapy recommendations: CIR  Disposition:  pending  Hypertension  Home BP meds: Zestril  Current BP meds:  norepinephrine ; vasopressin  Stable  Long-term BP goal normotensive  Hyperlipidemia  Home Lipid lowering medication: Zocor 20 mg daily  LDL 135, goal < 70   Current lipid lowering medication: consider Lipitor 80 mg daily   Continue statin at discharge   Other Active Problems, Findings, Recommendations and/or Plan  Code status -  Full  NPO -> tube feedings  Hyperglycemia ->SSI    Anemia - Hgb - 9.0  Mild thrombocytopenia - platelets - 138  HCAP vs Aspiration pneumonia - Rocephin ->Vancomycin->Cefepime on 1/16.   Leukocytosis - WBC's - 12.1 (afebrile)   Hospital day # 11 Patient has developed confusion and delirium which may be multifactorial.  Recommend Seroquel 12.5 mg at night for sleep and start Depakote sprinkles to 50 mg twice daily to help with agitation.  Check EEG for seizure activity.  Have sitter at the bedside.  Long discussion with the patient and wife and answered questions.  Discussed with Dr. Dina Rich.  Greater than 50% time during the 35-minute visit was spent on counseling and coordination of care about his confusion and delirium questions and discussion with care team. Delia Heady, MD     To contact Stroke Continuity provider, please refer to [email or web address]. After hours, contact General Neurology

## 2020-02-26 NOTE — Plan of Care (Signed)
Problem: Activity: Goal: Ability to tolerate increased activity will improve 02/26/2020 0146 by Mathews Robinsons, RN Outcome: Progressing 02/26/2020 0146 by Mathews Robinsons, RN Outcome: Progressing   Problem: Respiratory: Goal: Ability to maintain a clear airway and adequate ventilation will improve 02/26/2020 0146 by Mathews Robinsons, RN Outcome: Progressing 02/26/2020 0146 by Mathews Robinsons, RN Outcome: Progressing   Problem: Role Relationship: Goal: Method of communication will improve 02/26/2020 0146 by Mathews Robinsons, RN Outcome: Progressing 02/26/2020 0146 by Mathews Robinsons, RN Outcome: Progressing   Problem: Education: Goal: Understanding of CV disease, CV risk reduction, and recovery process will improve 02/26/2020 0146 by Mathews Robinsons, RN Outcome: Progressing 02/26/2020 0146 by Mathews Robinsons, RN Outcome: Progressing Goal: Individualized Educational Video(s) 02/26/2020 0146 by Mathews Robinsons, RN Outcome: Progressing 02/26/2020 0146 by Mathews Robinsons, RN Outcome: Progressing   Problem: Activity: Goal: Ability to return to baseline activity level will improve 02/26/2020 0146 by Mathews Robinsons, RN Outcome: Progressing 02/26/2020 0146 by Mathews Robinsons, RN Outcome: Progressing   Problem: Cardiovascular: Goal: Ability to achieve and maintain adequate cardiovascular perfusion will improve 02/26/2020 0146 by Mathews Robinsons, RN Outcome: Progressing 02/26/2020 0146 by Mathews Robinsons, RN Outcome: Progressing Goal: Vascular access site(s) Level 0-1 will be maintained 02/26/2020 0146 by Mathews Robinsons, RN Outcome: Progressing 02/26/2020 0146 by Mathews Robinsons, RN Outcome: Progressing   Problem: Health Behavior/Discharge Planning: Goal: Ability to safely manage health-related needs after discharge will improve 02/26/2020 0146 by Mathews Robinsons, RN Outcome: Progressing 02/26/2020 0146 by Mathews Robinsons, RN Outcome: Progressing   Problem: Education: Goal: Knowledge of General  Education information will improve Description: Including pain rating scale, medication(s)/side effects and non-pharmacologic comfort measures 02/26/2020 0146 by Mathews Robinsons, RN Outcome: Progressing 02/26/2020 0146 by Mathews Robinsons, RN Outcome: Progressing   Problem: Health Behavior/Discharge Planning: Goal: Ability to manage health-related needs will improve 02/26/2020 0146 by Mathews Robinsons, RN Outcome: Progressing 02/26/2020 0146 by Mathews Robinsons, RN Outcome: Progressing   Problem: Clinical Measurements: Goal: Ability to maintain clinical measurements within normal limits will improve 02/26/2020 0146 by Mathews Robinsons, RN Outcome: Progressing 02/26/2020 0146 by Mathews Robinsons, RN Outcome: Progressing Goal: Will remain free from infection 02/26/2020 0146 by Mathews Robinsons, RN Outcome: Progressing 02/26/2020 0146 by Mathews Robinsons, RN Outcome: Progressing Goal: Diagnostic test results will improve 02/26/2020 0146 by Mathews Robinsons, RN Outcome: Progressing 02/26/2020 0146 by Mathews Robinsons, RN Outcome: Progressing Goal: Respiratory complications will improve 02/26/2020 0146 by Mathews Robinsons, RN Outcome: Progressing 02/26/2020 0146 by Mathews Robinsons, RN Outcome: Progressing Goal: Cardiovascular complication will be avoided 02/26/2020 0146 by Mathews Robinsons, RN Outcome: Progressing 02/26/2020 0146 by Mathews Robinsons, RN Outcome: Progressing   Problem: Activity: Goal: Risk for activity intolerance will decrease Outcome: Progressing   Problem: Nutrition: Goal: Adequate nutrition will be maintained Outcome: Progressing   Problem: Coping: Goal: Level of anxiety will decrease Outcome: Progressing   Problem: Elimination: Goal: Will not experience complications related to bowel motility Outcome: Progressing Goal: Will not experience complications related to urinary retention Outcome: Progressing   Problem: Pain Managment: Goal: General experience of comfort will  improve Outcome: Progressing   Problem: Safety: Goal: Ability to remain free from injury will improve Outcome: Progressing   Problem: Skin Integrity: Goal: Risk for impaired skin integrity will decrease Outcome: Progressing   Problem: Education: Goal: Knowledge of disease or condition will improve Outcome: Progressing  Goal: Knowledge of secondary prevention will improve Outcome: Progressing Goal: Knowledge of patient specific risk factors addressed and post discharge goals established will improve Outcome: Progressing Goal: Individualized Educational Video(s) Outcome: Progressing   Problem: Coping: Goal: Will verbalize positive feelings about self Outcome: Progressing Goal: Will identify appropriate support needs Outcome: Progressing   Problem: Self-Care: Goal: Ability to participate in self-care as condition permits will improve Outcome: Progressing Goal: Verbalization of feelings and concerns over difficulty with self-care will improve Outcome: Progressing Goal: Ability to communicate needs accurately will improve Outcome: Progressing   Problem: Nutrition: Goal: Risk of aspiration will decrease Outcome: Progressing Goal: Dietary intake will improve Outcome: Progressing   Problem: Intracerebral Hemorrhage Tissue Perfusion: Goal: Complications of Intracerebral Hemorrhage will be minimized Outcome: Progressing

## 2020-02-26 NOTE — Plan of Care (Signed)
  Problem: Activity: Goal: Ability to tolerate increased activity will improve Outcome: Progressing   Problem: Respiratory: Goal: Ability to maintain a clear airway and adequate ventilation will improve Outcome: Progressing   Problem: Role Relationship: Goal: Method of communication will improve Outcome: Progressing   Problem: Education: Goal: Understanding of CV disease, CV risk reduction, and recovery process will improve Outcome: Progressing Goal: Individualized Educational Video(s) Outcome: Progressing   Problem: Activity: Goal: Ability to return to baseline activity level will improve Outcome: Progressing   Problem: Cardiovascular: Goal: Ability to achieve and maintain adequate cardiovascular perfusion will improve Outcome: Progressing Goal: Vascular access site(s) Level 0-1 will be maintained Outcome: Progressing   Problem: Health Behavior/Discharge Planning: Goal: Ability to safely manage health-related needs after discharge will improve Outcome: Progressing   Problem: Education: Goal: Knowledge of General Education information will improve Description: Including pain rating scale, medication(s)/side effects and non-pharmacologic comfort measures Outcome: Progressing   Problem: Health Behavior/Discharge Planning: Goal: Ability to manage health-related needs will improve Outcome: Progressing   Problem: Clinical Measurements: Goal: Ability to maintain clinical measurements within normal limits will improve Outcome: Progressing Goal: Will remain free from infection Outcome: Progressing Goal: Diagnostic test results will improve Outcome: Progressing Goal: Respiratory complications will improve Outcome: Progressing Goal: Cardiovascular complication will be avoided Outcome: Progressing   Problem: Activity: Goal: Risk for activity intolerance will decrease Outcome: Progressing   Problem: Nutrition: Goal: Adequate nutrition will be maintained Outcome:  Progressing   Problem: Coping: Goal: Level of anxiety will decrease Outcome: Progressing   Problem: Elimination: Goal: Will not experience complications related to bowel motility Outcome: Progressing Goal: Will not experience complications related to urinary retention Outcome: Progressing   Problem: Pain Managment: Goal: General experience of comfort will improve Outcome: Progressing   Problem: Safety: Goal: Ability to remain free from injury will improve Outcome: Progressing   Problem: Skin Integrity: Goal: Risk for impaired skin integrity will decrease Outcome: Progressing   Problem: Education: Goal: Knowledge of disease or condition will improve Outcome: Progressing Goal: Knowledge of secondary prevention will improve Outcome: Progressing Goal: Knowledge of patient specific risk factors addressed and post discharge goals established will improve Outcome: Progressing Goal: Individualized Educational Video(s) Outcome: Progressing   Problem: Coping: Goal: Will verbalize positive feelings about self Outcome: Progressing Goal: Will identify appropriate support needs Outcome: Progressing   Problem: Health Behavior/Discharge Planning: Goal: Ability to manage health-related needs will improve Outcome: Progressing   Problem: Self-Care: Goal: Ability to participate in self-care as condition permits will improve Outcome: Progressing Goal: Verbalization of feelings and concerns over difficulty with self-care will improve Outcome: Progressing Goal: Ability to communicate needs accurately will improve Outcome: Progressing   Problem: Nutrition: Goal: Risk of aspiration will decrease Outcome: Progressing Goal: Dietary intake will improve Outcome: Progressing   Problem: Intracerebral Hemorrhage Tissue Perfusion: Goal: Complications of Intracerebral Hemorrhage will be minimized Outcome: Progressing   

## 2020-02-26 NOTE — Progress Notes (Signed)
ABG collected and the result given to NP.

## 2020-02-26 NOTE — Progress Notes (Signed)
EEG complete - results pending 

## 2020-02-26 NOTE — Procedures (Signed)
History: 65 year old male being evaluated for delirium  Sedation: Lorazepam given a few hours prior to study  Technique: This is a 21 channel routine scalp EEG performed at the bedside with bipolar and monopolar montages arranged in accordance to the international 10/20 system of electrode placement. One channel was dedicated to EKG recording.    Background: There is a well-formed posterior dominant rhythm of 9 Hz which is seen bioccipitally and is reactive to eye opening/closure..  In addition, there is an excessive amount of bifrontally predominant beta activity.  In addition there is diffuse intrusion of delta and theta range her regular activities throughout the study.  No epileptiform discharges were seen throughout the study.  Photic stimulation: Physiologic driving is not performed  EEG Abnormalities: 1) intrusion into the background of diffuse irregular generalized slow activity  Clinical Interpretation: This EEG is consistent with a mild generalized nonspecific cerebral dysfunction (encephalopathy). There was no seizure or seizure predisposition recorded on this study. Please note that lack of epileptiform activity on EEG does not preclude the possibility of epilepsy.   Ritta Slot, MD [facility] [phone]  If 7pm- 7am, please page neurology on call as listed in AMION.

## 2020-02-26 NOTE — Progress Notes (Addendum)
Advanced Heart Failure Rounding Note   Subjective:    1/12 admitted for Vfib arrest, cath PCI/DES and impella 1/13 Echo EF ~50-55% LV small RV ok. 1/14 VT arrest, ROSC after 3 minutes.  1/15 Impella removed 1/16 Left sided weakness. MRI with multiple small infarcts and small ICH 1/18 Extubated.   Early in the night, Jeffrey Morse was alert and oriented x 4, however has the night progressed he had hyperactive delirium (disoriented to place but following commands and no new neuro deficits).  He was given melatonin to help sleep w/o success, then trazodone.  He started to become increasingly agitated trying to get out of bed and trying to pull foley/flexiseal.  He was given ativan with some improvement in agitation but worn off an hour later.  Given another dose of ativan w/o changes in agitation.  He was still able to follow commands but disoriented.  ABG showed some hypoxemia. Sent for CT brain.  Started on precedex.    Objective:   Weight Range:  Vital Signs:   Temp:  [97.7 F (36.5 C)-98.6 F (37 C)] 97.9 F (36.6 C) (01/23 0346) Pulse Rate:  [48-108] 80 (01/23 0500) Resp:  [16-40] 28 (01/23 0400) BP: (99-145)/(68-93) 113/81 (01/23 0400) SpO2:  [80 %-100 %] 95 % (01/23 0500) Last BM Date: 02/25/20  Weight change: Filed Weights   02/23/20 0500 02/24/20 0338 02/25/20 0500  Weight: 97.9 kg 98.7 kg 99.1 kg    Intake/Output:   Intake/Output Summary (Last 24 hours) at 02/26/2020 0644 Last data filed at 02/26/2020 0500 Gross per 24 hour  Intake 1800 ml  Output 2825 ml  Net -1025 ml     Physical Exam: General:  agitated HEENT: dry MM Neck: supple.  Cor: Regular rate & rhythm. No rubs, gallops or murmurs. Lungs: clear Abdomen: soft, nontender, nondistended. No hepatosplenomegaly. No bruits or masses. Good bowel sounds. Flexiseal.  Extremities: no cyanosis, clubbing, rash, edema Neuro: alert, oriented to self, time but not to place.  Follows commands.  PERRLA.     Telemetry: NSR with rates 80s.  Personally reviewed.    Labs: Basic Metabolic Panel: Recent Labs  Lab 02/20/20 0504 02/20/20 0508 02/21/20 0452 02/21/20 0536 02/22/20 0456 02/22/20 1556 02/22/20 1557 02/23/20 0355 02/24/20 0404 02/25/20 1046 02/26/20 0607  NA 141   < > 141   < > 143   < > 146* 142 139 136 136  K 3.2*   < > 3.0*   < > 2.8*   < > 3.3* 3.6 3.9 4.4 4.6  CL 102   < > 102   < > 108  --  112* 109 108 105  --   CO2 24   < > 24   < > 24  --  23 23 21* 21*  --   GLUCOSE 128*   < > 124*   < > 195*  --  196* 230* 255* 202*  --   BUN 23   < > 22   < > 27*  --  27* 25* 18 20  --   CREATININE 1.04   < > 1.02   < > 0.87  --  0.83 0.81 0.67 0.70  --   CALCIUM 8.5*   < > 8.4*   < > 8.8*  --  8.5* 8.1* 7.9* 9.0  --   MG 2.0  --  1.9  --  2.2  --   --  2.0 1.9  --   --  PHOS 3.8  --  3.8  --  2.1*  --   --  2.7 3.0  --   --    < > = values in this interval not displayed.    Liver Function Tests: Recent Labs  Lab 02/24/20 0404  AST 133*  ALT 110*  ALKPHOS 141*  BILITOT 0.9  PROT 5.7*  ALBUMIN 2.2*   No results for input(s): LIPASE, AMYLASE in the last 168 hours. No results for input(s): AMMONIA in the last 168 hours.  CBC: Recent Labs  Lab 02/21/20 0452 02/21/20 0536 02/22/20 0456 02/22/20 1556 02/23/20 0355 02/24/20 0404 02/25/20 1046 02/26/20 0607  WBC 8.3  --  9.2  --  9.8 9.3 11.7*  --   HGB 9.9*   < > 10.3* 9.9* 8.7* 9.5* 10.7* 10.9*  HCT 30.4*   < > 31.5* 29.0* 26.0* 30.0* 32.4* 32.0*  MCV 88.1  --  88.7  --  88.7 89.8 88.0  --   PLT 223  --  271  --  284 355 523*  --    < > = values in this interval not displayed.    Cardiac Enzymes: No results for input(s): CKTOTAL, CKMB, CKMBINDEX, TROPONINI in the last 168 hours.  BNP: BNP (last 3 results) No results for input(s): BNP in the last 8760 hours.  ProBNP (last 3 results) No results for input(s): PROBNP in the last 8760 hours.    Other results:  Imaging: No results  found.   Medications:     Scheduled Medications: . aspirin  81 mg Oral Daily  . carvedilol  3.125 mg Per Tube BID WC  . Chlorhexidine Gluconate Cloth  6 each Topical Daily  . clopidogrel  75 mg Per Tube Daily  . feeding supplement  237 mL Per Tube BID BM  . feeding supplement (PROSource TF)  45 mL Per Tube QID  . fiber  1 packet Per Tube BID  . insulin aspart  0-15 Units Subcutaneous TID AC & HS  . insulin detemir  5 Units Subcutaneous Daily  . mouth rinse  15 mL Mouth Rinse BID  . nystatin  5 mL Mouth/Throat QID  . pantoprazole sodium  40 mg Per Tube Daily  . sacubitril-valsartan  1 tablet Per Tube BID  . spironolactone  25 mg Per Tube Daily    Infusions: . dexmedetomidine (PRECEDEX) IV infusion 0.2 mcg/kg/hr (02/26/20 0175)  . feeding supplement (OSMOLITE 1.5 CAL) 85 mL/hr at 02/26/20 0500    PRN Medications: acetaminophen **OR** acetaminophen (TYLENOL) oral liquid 160 mg/5 mL **OR** acetaminophen, melatonin   Assessment/Plan:   1. Cardiac (VTarrest in setting of acute inferolateral STEMI/VT - OOH cardiac arrest on 1/12 with prolonged CPR, resuscitated w/ CPR + defibrillation   - Emergent cath 1/12 w/ totally occluded mid LCx treated w/ PCI + DES - Recurrent VT on 1/14 -> defib x 1 - Off amiodarone  - Supp K - Mag stable.   2. Acute systolic HF due in setting of acute MI -> cardiogenic shock - EF 25% in cath lab 1/12 - Echo 1/13 EF 50-55% - Impella pulled 1/14 - CO-OX remains stable.  - Renal function stable.  - Continue low dose entresto, spiro 25mg , no diuretic today.   3. CAD with acute inferolateral  STEMI  - Emergent cath 1/12 w/ totally occluded mid LCx treated w/ PCI + DES - There is moderate ostial LAD disease and significant disease in the distal LAD close to the apex. No significant disease  affecting the right coronary artery. - Continue DAPT. c/w statin and ASA - Brilinta switched to Plavix; Plavix therapeutic per P2y12 lab.  - No b-blocker yet  with shock   4. Acute CVA with ICH - left-sided weakness --> now not following commands; repeat CT shows stable hemorrhage - MRI with multiple small infarcts and small ICH (Likely cardioembolic from arrest) - Neuro following - Brilinta switched to Plavix - PT/OT/Speech  - L-sided deficits improved but delirious overnight. Tranfer to [facility] and arrange CIR  5. Acute hypoxic respiratory failure in setting of cardiac arrest - CXR atelectasis, CPT 8.54-->6 -> 3.7; FiO2 50% - Broadened abx d/t fever (Blood cx NGTD, sputum growing similar flora from previous respiratory cultures from 1/14)  - Extubated 02/21/20  6, Ileus - Cor-trac placed 02/21/20 - resolved, no longer on reglan  7. AKI - due to ATN -> resolved   8. DM2 - HgbA1c 6.0, -SSI - Add Jardiance prior to d/c  9. Bradycardia -Resolved.   10.  Febrile - 1/16 urine, blood and respiratory cultures sent NGTD - Remains afebrile  11. R Cephalic superficial venous thrombosis - Continue to monitor - Elevate arm. Warm compresses   Length of Stay: 11   Terance Ice MD 02/26/2020, 6:44 AM  Advanced Heart Failure Team Pager [phone] (M-F; 7a - 4p)  Please contact [facility] for night-coverage after hours (4p -7a ) and weekends on [email or web address]  Patient seen indepedently, NP Liggett's note reviewed. Patient admitted with VF arrest in setting of inferolateral STEMI, s/p DES to LCX. Initially impella support that was weaned off. Recurrent VT Jan 14, started on amio now off. LVgram EF 25%, with subsequent stabilization echo LVEF 50-55%, posterior wall hypokinesis. Medical therapy with ASA 81,coreg 3.125mg  bid, plavix 75, entrsto 24/26mg  bid, aldactone 25mg   Current sats 95% RA, CXR showed stable lower lung opacities. PO2 was low this AM 62 by ABG, around that time sats mid 80s however now mid 90s on RA. Unclear if with agitation/delerium if potentially sedated from agitation meds   Admission complicated by CVA, with left sided  weakness and dysphagia. Cor trak feeds per nutrition, patient on puree diet per speech. Awaiting inpatient rehab placement. Issues with delerium overnight. CT head stable. Received ativa 1mg  x 1, given trazodone 50mg ,  started on precedex.   Will ask neuro who had been following him for CVA if can assist with delerium/agitation, I have spoke to Dr who will evaluate the patient.     Fever this admit Jan 16, pan cx negativ.e Has been treated for possible HCAP/aspiration pneumonia. Started on ceftriazone but remained febril, changed to vanc/cefepime Jan 18. Completed abx course,afebrile  Awaiting transfer to floor, and eventual inpatient rehab.   Pearlean Brownie MD

## 2020-02-26 NOTE — Care Plan (Signed)
Pt's agitation increasing overnight, confused to place and time, multiple attempts to get out of bed, pulling off leads. Mitt's attempted with little success.  HF PA notified, Ativan adm X2 per order and Precedex drip started. CT of head obtained per order.  Wife, [name], notified of change of status

## 2020-02-27 DIAGNOSIS — Z955 Presence of coronary angioplasty implant and graft: Secondary | ICD-10-CM | POA: Diagnosis not present

## 2020-02-27 DIAGNOSIS — I6330 Cerebral infarction due to thrombosis of unspecified cerebral artery: Secondary | ICD-10-CM | POA: Diagnosis not present

## 2020-02-27 DIAGNOSIS — G9341 Metabolic encephalopathy: Secondary | ICD-10-CM

## 2020-02-27 DIAGNOSIS — R410 Disorientation, unspecified: Secondary | ICD-10-CM | POA: Diagnosis not present

## 2020-02-27 DIAGNOSIS — I2121 ST elevation (STEMI) myocardial infarction involving left circumflex coronary artery: Secondary | ICD-10-CM | POA: Diagnosis not present

## 2020-02-27 LAB — CBC
HCT: 30.8 % — ABNORMAL LOW (ref 39.0–52.0)
Hemoglobin: 10.4 g/dL — ABNORMAL LOW (ref 13.0–17.0)
MCH: 29.2 pg (ref 26.0–34.0)
MCHC: 33.8 g/dL (ref 30.0–36.0)
MCV: 86.5 fL (ref 80.0–100.0)
Platelets: 640 10*3/uL — ABNORMAL HIGH (ref 150–400)
RBC: 3.56 MIL/uL — ABNORMAL LOW (ref 4.22–5.81)
RDW: 14.7 % (ref 11.5–15.5)
WBC: 12.1 10*3/uL — ABNORMAL HIGH (ref 4.0–10.5)
nRBC: 0 % (ref 0.0–0.2)

## 2020-02-27 LAB — BASIC METABOLIC PANEL
Anion gap: 9 (ref 5–15)
BUN: 26 mg/dL — ABNORMAL HIGH (ref 8–23)
CO2: 23 mmol/L (ref 22–32)
Calcium: 8.9 mg/dL (ref 8.9–10.3)
Chloride: 100 mmol/L (ref 98–111)
Creatinine, Ser: 0.85 mg/dL (ref 0.61–1.24)
GFR, Estimated: >60 mL/min (ref >60)
Glucose, Bld: 274 mg/dL — ABNORMAL HIGH (ref 70–99)
Potassium: 4.5 mmol/L (ref 3.5–5.1)
Sodium: 132 mmol/L — ABNORMAL LOW (ref 135–145)

## 2020-02-27 LAB — GLUCOSE, CAPILLARY
Glucose-Capillary: 124 mg/dL — ABNORMAL HIGH (ref 70–99)
Glucose-Capillary: 178 mg/dL — ABNORMAL HIGH (ref 70–99)
Glucose-Capillary: 216 mg/dL — ABNORMAL HIGH (ref 70–99)
Glucose-Capillary: 228 mg/dL — ABNORMAL HIGH (ref 70–99)
Glucose-Capillary: 256 mg/dL — ABNORMAL HIGH (ref 70–99)
Glucose-Capillary: 262 mg/dL — ABNORMAL HIGH (ref 70–99)
Glucose-Capillary: 262 mg/dL — ABNORMAL HIGH (ref 70–99)
Glucose-Capillary: 267 mg/dL — ABNORMAL HIGH (ref 70–99)

## 2020-02-27 MED ORDER — QUETIAPINE FUMARATE 50 MG PO TABS: 25.0000 mg | ORAL_TABLET | Freq: Every day | ORAL | Status: DC

## 2020-02-27 MED ORDER — RAMELTEON 8 MG PO TABS
8.0000 mg | ORAL_TABLET | Freq: Every day | ORAL | Status: DC
  Administered 2020-02-27: 8 mg via ORAL
  Filled 2020-02-27 (×2): qty 1

## 2020-02-27 MED ORDER — QUETIAPINE FUMARATE 50 MG PO TABS
50.0000 mg | ORAL_TABLET | Freq: Every day | ORAL | Status: DC
  Administered 2020-02-27: 50 mg via ORAL
  Filled 2020-02-27: qty 1

## 2020-02-27 NOTE — Plan of Care (Signed)
°  Problem: Activity: Goal: Ability to tolerate increased activity will improve Outcome: Progressing   Problem: Respiratory: Goal: Ability to maintain a clear airway and adequate ventilation will improve Outcome: Progressing   Problem: Role Relationship: Goal: Method of communication will improve Outcome: Progressing   Problem: Education: Goal: Understanding of CV disease, CV risk reduction, and recovery process will improve Outcome: Progressing Goal: Individualized Educational Video(s) Outcome: Progressing   Problem: Activity: Goal: Ability to return to baseline activity level will improve Outcome: Progressing   Problem: Cardiovascular: Goal: Ability to achieve and maintain adequate cardiovascular perfusion will improve Outcome: Progressing Goal: Vascular access site(s) Level 0-1 will be maintained Outcome: Progressing   Problem: Health Behavior/Discharge Planning: Goal: Ability to safely manage health-related needs after discharge will improve Outcome: Progressing   Problem: Education: Goal: Knowledge of General Education information will improve Description: Including pain rating scale, medication(s)/side effects and non-pharmacologic comfort measures Outcome: Progressing   Problem: Health Behavior/Discharge Planning: Goal: Ability to manage health-related needs will improve Outcome: Progressing   Problem: Clinical Measurements: Goal: Ability to maintain clinical measurements within normal limits will improve Outcome: Progressing Goal: Will remain free from infection Outcome: Progressing Goal: Diagnostic test results will improve Outcome: Progressing Goal: Respiratory complications will improve Outcome: Progressing Goal: Cardiovascular complication will be avoided Outcome: Progressing   Problem: Activity: Goal: Risk for activity intolerance will decrease Outcome: Progressing   Problem: Nutrition: Goal: Adequate nutrition will be maintained Outcome:  Progressing   Problem: Coping: Goal: Level of anxiety will decrease Outcome: Progressing   Problem: Elimination: Goal: Will not experience complications related to bowel motility Outcome: Progressing Goal: Will not experience complications related to urinary retention Outcome: Progressing   Problem: Pain Managment: Goal: General experience of comfort will improve Outcome: Progressing   Problem: Safety: Goal: Ability to remain free from injury will improve Outcome: Progressing   Problem: Skin Integrity: Goal: Risk for impaired skin integrity will decrease Outcome: Progressing   Problem: Education: Goal: Knowledge of disease or condition will improve Outcome: Progressing Goal: Knowledge of secondary prevention will improve Outcome: Progressing Goal: Knowledge of patient specific risk factors addressed and post discharge goals established will improve Outcome: Progressing Goal: Individualized Educational Video(s) Outcome: Progressing   Problem: Coping: Goal: Will verbalize positive feelings about self Outcome: Progressing Goal: Will identify appropriate support needs Outcome: Progressing   Problem: Health Behavior/Discharge Planning: Goal: Ability to manage health-related needs will improve Outcome: Progressing   Problem: Self-Care: Goal: Ability to participate in self-care as condition permits will improve Outcome: Progressing Goal: Verbalization of feelings and concerns over difficulty with self-care will improve Outcome: Progressing Goal: Ability to communicate needs accurately will improve Outcome: Progressing   Problem: Nutrition: Goal: Risk of aspiration will decrease Outcome: Progressing Goal: Dietary intake will improve Outcome: Progressing   Problem: Intracerebral Hemorrhage Tissue Perfusion: Goal: Complications of Intracerebral Hemorrhage will be minimized Outcome: Progressing

## 2020-02-27 NOTE — H&P (Incomplete)
Physical Medicine and Rehabilitation Admission H&P    Chief Complaint  Patient presents with  . CPR  . Code STEMI  : HPI: Jeffrey Morse is a 65 year old right-handed male with history of hypertension as well as hyperlipidemia.  Per chart review lives with spouse independent prior to admission working full-time.  1 level home 3 steps to entry.  Presented 02/15/2020 with chest pain and went to the urgent care where he collapsed and CPR was initiated.  He was in ventricular fibrillation and was shocked successfully.  CPR was done for approximately 15 minutes.  While in the ambulance he lost pulse again went to PEA arrest CPR resumed.  He did require intubation.  Patient was hypotensive but improved with epinephrine.  Echocardiogram revealed severely reduced LV systolic function no evidence of pericardial effusion.  EKG after CPR showed evidence of inferior ST elevation with lateral involvement suggestive of left circumflex occlusion.  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  Recurrent Vt after cardiac cath defibrillated x1 maintained on amiodarone.  Neurology consulted 02/17/2020 for left-sided weakness.  EEG negative.  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  New right thalamic microhemorrhage as compared to scan from 01/18/2012.  MRA was unremarkable.  Currently maintained on aspirin and Plavix for CVA prophylaxis.  Hospital course ileus and cortrak placed 02/21/2020 diet slowly advanced dysphagia #2 thin liquids.  Findings of elevated hemoglobin A1c 6.0 currently maintained on Levemir.  Bouts of agitation and restlessness maintained on Seroquel as well as valproic acid.  Patient did have persistent fevers there was some question related to right upper extremity thrombus versus pneumonia it did resolve after Precedex stop so felt to be possibly drug fever.  Venous Doppler study was negative.   Therapy evaluations  completed due to patient decreased functional mobility left side weakness he was admitted for a comprehensive rehab program.  Review of Systems  Constitutional: Positive for fever.  HENT: Negative for hearing loss.   Eyes: Negative for blurred vision and double vision.  Respiratory: Positive for shortness of breath.   Cardiovascular: Positive for chest pain. Negative for leg swelling.  Gastrointestinal: Positive for constipation. Negative for heartburn, nausea and vomiting.  Genitourinary: Negative for dysuria, flank pain and hematuria.  Musculoskeletal: Positive for myalgias.  Skin: Negative for rash.  Neurological: Positive for weakness.  All other systems reviewed and are negative.  Past Medical History:  Diagnosis Date  . HTN (hypertension)   . Hypercholesteremia   . Sleep apnea    Past Surgical History:  Procedure Laterality Date  . BACK SURGERY    . CORONARY STENT INTERVENTION N/A 02/15/2020   Procedure: CORONARY STENT INTERVENTION;  Surgeon: Iran Ouch, MD;  Location: [facility];  Service: Cardiovascular;  Laterality: N/A;  CFX  . KNEE SURGERY Left 2012  . LEFT HEART CATH AND CORONARY ANGIOGRAPHY N/A 02/15/2020   Procedure: LEFT HEART CATH AND CORONARY ANGIOGRAPHY;  Surgeon: Iran Ouch, MD;  Location: [facility];  Service: Cardiovascular;  Laterality: N/A;  . RIGHT HEART CATH N/A 02/15/2020   Procedure: RIGHT HEART CATH;  Surgeon: Iran Ouch, MD;  Location: [facility];  Service: Cardiovascular;  Laterality: N/A;  . VENTRICULAR ASSIST DEVICE INSERTION N/A 02/15/2020   Procedure: VENTRICULAR ASSIST DEVICE INSERTION;  Surgeon: Iran Ouch, MD;  Location: [facility];  Service: Cardiovascular;  Laterality: N/A;  iMPELLA    Family  History  Problem Relation Age of Onset  . Colon cancer Mother   . Stroke Father    Social History:  reports that he has quit smoking. He has never used smokeless tobacco. He reports previous  alcohol use. He reports that he does not use drugs. Allergies: No Known Allergies Medications Prior to Admission  Medication Sig Dispense Refill  . escitalopram (LEXAPRO) 20 MG tablet Take 20 mg by mouth daily.    Marland Kitchen lisinopril (ZESTRIL) 10 MG tablet Take 10 mg by mouth daily.    Marland Kitchen omeprazole (PRILOSEC) 20 MG capsule Take 20 mg by mouth daily.    . simvastatin (ZOCOR) 20 MG tablet Take 20 mg by mouth daily.      Drug Regimen Review  Drug regimen was reviewed and remains appropriate with no significant issues identified  Home: Home Living Family/patient expects to be discharged to:: Private residence Living Arrangements: Spouse/significant other Available Help at Discharge: Family Type of Home: Mobile home Home Access: Stairs to enter Secretary/administrator of Steps: 3 Entrance Stairs-Rails: Can reach both Home Layout: One level Bathroom Shower/Tub: Tub only Firefighter: Standard Home Equipment: None   Functional History: Prior Function Level of Independence: Independent Comments: independent, works in Hospital doctor, working the day prior to MI  Functional Status:  Mobility: Bed Mobility Overal bed mobility: Needs Assistance Bed Mobility: Supine to Sit Supine to sit: Min assist,+2 for safety/equipment Sit to supine: Total assist,+2 for physical assistance,HOB elevated General bed mobility comments: Min assistance to move to edge of bed.  Once in sitting able to work on sock ADL with OTA. Transfers Overall transfer level: Needs assistance Equipment used: Rolling walker (2 wheeled) Transfer via Lift Equipment: Stedy Transfers: Sit to/from Stand Sit to Stand: Mod assist,+2 physical assistance,+2 safety/equipment,From elevated surface Squat pivot transfers: Max assist,+2 physical assistance General transfer comment: Pt performed mod +2 from elevated surface with poor hand placement and require hand over hand assistance to push into standing.  Pt holding to RW when returning to  seated surface.  Pt with increased difficulty standing from recliner to sink. Ambulation/Gait Ambulation/Gait assistance: Mod assist,+2 safety/equipment (close chair follow.) Gait Distance (Feet): 30 Feet Assistive device: Rolling walker (2 wheeled) Gait Pattern/deviations: Step-through pattern,Ataxic,Trunk flexed,Decreased stride length,Decreased step length - left,Decreased dorsiflexion - left General Gait Details: Lists to teh L during gt training, required facilitation to weight shift during gt training.  Pt slow and fatigues quickly but able to progress to gt training.    ADL: ADL Overall ADL's : Needs assistance/impaired Eating/Feeding: NPO Grooming: Oral care,Standing,Supervision/safety,Cueing for sequencing,Minimal assistance,Moderate assistance Grooming Details (indicate cue type and reason): MOD A - MIN A at times for standing balance with pt heavily leaning to L side, supervision for sequencing task of oral care. Upper Body Bathing: Maximal assistance,Bed level Upper Body Bathing Details (indicate cue type and reason): balance support Lower Body Bathing: Total assistance,Bed level Upper Body Dressing : Minimal assistance,Sitting Upper Body Dressing Details (indicate cue type and reason): to don gown as back side cover Lower Body Dressing: Minimal assistance,Sitting/lateral leans Lower Body Dressing Details (indicate cue type and reason): to don socks from EOB Toilet Transfer: Minimal assistance,RW,Ambulation,+2 for safety/equipment Toilet Transfer Details (indicate cue type and reason): MIN A +2 for safety for simulated toilet transfer Toileting- Clothing Manipulation and Hygiene: Total assistance,Bed level Functional mobility during ADLs: Minimal assistance,+2 for safety/equipment General ADL Comments: pt continues to present with impaired balance, decreased activity tolerance, L sided weakness and cognitive deficits  Cognition: Cognition Overall Cognitive  Status:  Impaired/Different from baseline Orientation Level: Oriented to person,Disoriented to place,Disoriented to time,Disoriented to situation Cognition Arousal/Alertness: Awake/alert Behavior During Therapy: Restless,Impulsive Overall Cognitive Status: Impaired/Different from baseline Area of Impairment: Following commands,Awareness,Safety/judgement,Orientation Orientation Level: Place,Disoriented to (kept asking to return to his room although pt in his room) Following Commands: Follows one step commands with increased time,Follows multi-step commands with increased time Safety/Judgement: Decreased awareness of safety,Decreased awareness of deficits Awareness: Emergent General Comments: very interactive today, increased command follow, has poor understanding of his deficits and how his deficits impact his safety Difficult to assess due to: Level of arousal,Intubated  Physical Exam: Blood pressure (!) 82/56, pulse 85, temperature 98.2 F (36.8 C), temperature source Oral, resp. rate 18, height 5\' 10"  (1.778 m), weight 99.1 kg, SpO2 94 %. Physical Exam HENT:     Head:     Comments: Nasogastric tube in place Neurological:     Comments: Patient is alert.  Wife is at bedside.  Makes eye contact with examiner.  Provides his name and date of birth needed cues for place and age.     Results for orders placed or performed during the hospital encounter of 02/15/20 (from the past 48 hour(s))  Glucose, capillary     Status: Abnormal   Collection Time: 02/25/20  3:14 PM  Result Value Ref Range   Glucose-Capillary 240 (H) 70 - 99 mg/dL    Comment: Glucose reference range applies only to samples taken after fasting for at least 8 hours.  Glucose, capillary     Status: Abnormal   Collection Time: 02/25/20  7:52 PM  Result Value Ref Range   Glucose-Capillary 218 (H) 70 - 99 mg/dL    Comment: Glucose reference range applies only to samples taken after fasting for at least 8 hours.  Glucose, capillary      Status: Abnormal   Collection Time: 02/26/20 12:15 AM  Result Value Ref Range   Glucose-Capillary 194 (H) 70 - 99 mg/dL    Comment: Glucose reference range applies only to samples taken after fasting for at least 8 hours.  Glucose, capillary     Status: Abnormal   Collection Time: 02/26/20  3:43 AM  Result Value Ref Range   Glucose-Capillary 222 (H) 70 - 99 mg/dL    Comment: Glucose reference range applies only to samples taken after fasting for at least 8 hours.  I-STAT 7, (LYTES, BLD GAS, ICA, H+H)     Status: Abnormal   Collection Time: 02/26/20  6:07 AM  Result Value Ref Range   pH, Arterial 7.489 (H) 7.350 - 7.450   pCO2 arterial 31.7 (L) 32.0 - 48.0 mmHg   pO2, Arterial 62 (L) 83.0 - 108.0 mmHg   Bicarbonate 24.2 20.0 - 28.0 mmol/L   TCO2 25 22 - 32 mmol/L   O2 Saturation 94.0 %   Acid-Base Excess 1.0 0.0 - 2.0 mmol/L   Sodium 136 135 - 145 mmol/L   Potassium 4.6 3.5 - 5.1 mmol/L   Calcium, Ion 1.24 1.15 - 1.40 mmol/L   HCT 32.0 (L) 39.0 - 52.0 %   Hemoglobin 10.9 (L) 13.0 - 17.0 g/dL   Patient temperature 69.697.9 F    Collection site Radial    Drawn by HIDE    Sample type ARTERIAL   Glucose, capillary     Status: Abnormal   Collection Time: 02/26/20  7:27 AM  Result Value Ref Range   Glucose-Capillary 268 (H) 70 - 99 mg/dL    Comment: Glucose reference range applies  only to samples taken after fasting for at least 8 hours.  Basic metabolic panel     Status: Abnormal   Collection Time: 02/26/20  8:57 AM  Result Value Ref Range   Sodium 132 (L) 135 - 145 mmol/L   Potassium 4.5 3.5 - 5.1 mmol/L   Chloride 100 98 - 111 mmol/L   CO2 23 22 - 32 mmol/L   Glucose, Bld 290 (H) 70 - 99 mg/dL    Comment: Glucose reference range applies only to samples taken after fasting for at least 8 hours.   BUN 21 8 - 23 mg/dL   Creatinine, Ser 1.61 0.61 - 1.24 mg/dL   Calcium 8.8 (L) 8.9 - 10.3 mg/dL   GFR, Estimated >09 >60 mL/min    Comment: (NOTE) Calculated using the CKD-EPI  Creatinine Equation (2021)    Anion gap 9 5 - 15    Comment: Performed at [facility], [address], [place], [place] [address]  CBC     Status: Abnormal   Collection Time: 02/26/20  8:57 AM  Result Value Ref Range   WBC 12.1 (H) 4.0 - 10.5 K/uL   RBC 3.83 (L) 4.22 - 5.81 MIL/uL   Hemoglobin 11.1 (L) 13.0 - 17.0 g/dL   HCT 81.1 (L) 91.4 - 78.2 %   MCV 88.0 80.0 - 100.0 fL   MCH 29.0 26.0 - 34.0 pg   MCHC 32.9 30.0 - 36.0 g/dL   RDW 95.6 21.3 - 08.6 %   Platelets 567 (H) 150 - 400 K/uL   nRBC 0.0 0.0 - 0.2 %    Comment: Performed at [facility], [address] [place], [place] [address]  Glucose, capillary     Status: Abnormal   Collection Time: 02/26/20 12:28 PM  Result Value Ref Range   Glucose-Capillary 179 (H) 70 - 99 mg/dL    Comment: Glucose reference range applies only to samples taken after fasting for at least 8 hours.  Glucose, capillary     Status: Abnormal   Collection Time: 02/26/20  4:50 PM  Result Value Ref Range   Glucose-Capillary 107 (H) 70 - 99 mg/dL    Comment: Glucose reference range applies only to samples taken after fasting for at least 8 hours.  Glucose, capillary     Status: Abnormal   Collection Time: 02/26/20  7:46 PM  Result Value Ref Range   Glucose-Capillary 179 (H) 70 - 99 mg/dL    Comment: Glucose reference range applies only to samples taken after fasting for at least 8 hours.  Glucose, capillary     Status: Abnormal   Collection Time: 02/26/20 10:01 PM  Result Value Ref Range   Glucose-Capillary 263 (H) 70 - 99 mg/dL    Comment: Glucose reference range applies only to samples taken after fasting for at least 8 hours.  Glucose, capillary     Status: Abnormal   Collection Time: 02/27/20 12:05 AM  Result Value Ref Range   Glucose-Capillary 216 (H) 70 - 99 mg/dL    Comment: Glucose reference range applies only to samples taken after fasting for at least 8 hours.  Glucose, capillary     Status: Abnormal   Collection Time:  02/27/20  4:19 AM  Result Value Ref Range   Glucose-Capillary 228 (H) 70 - 99 mg/dL    Comment: Glucose reference range applies only to samples taken after fasting for at least 8 hours.  Basic metabolic panel     Status: Abnormal  Collection Time: 02/27/20  4:55 AM  Result Value Ref Range   Sodium 132 (L) 135 - 145 mmol/L   Potassium 4.5 3.5 - 5.1 mmol/L   Chloride 100 98 - 111 mmol/L   CO2 23 22 - 32 mmol/L   Glucose, Bld 274 (H) 70 - 99 mg/dL    Comment: Glucose reference range applies only to samples taken after fasting for at least 8 hours.   BUN 26 (H) 8 - 23 mg/dL   Creatinine, Ser 7.06 0.61 - 1.24 mg/dL   Calcium 8.9 8.9 - 23.7 mg/dL   GFR, Estimated >62 >83 mL/min    Comment: (NOTE) Calculated using the CKD-EPI Creatinine Equation (2021)    Anion gap 9 5 - 15    Comment: Performed at [facility], [address], [place], [place] [address]  CBC     Status: Abnormal   Collection Time: 02/27/20  4:55 AM  Result Value Ref Range   WBC 12.1 (H) 4.0 - 10.5 K/uL   RBC 3.56 (L) 4.22 - 5.81 MIL/uL   Hemoglobin 10.4 (L) 13.0 - 17.0 g/dL   HCT 16.0 (L) 73.7 - 10.6 %   MCV 86.5 80.0 - 100.0 fL   MCH 29.2 26.0 - 34.0 pg   MCHC 33.8 30.0 - 36.0 g/dL   RDW 26.9 48.5 - 46.2 %   Platelets 640 (H) 150 - 400 K/uL   nRBC 0.0 0.0 - 0.2 %    Comment: Performed at [facility], [address], [place], [place] [address]  Glucose, capillary     Status: Abnormal   Collection Time: 02/27/20  7:53 AM  Result Value Ref Range   Glucose-Capillary 262 (H) 70 - 99 mg/dL    Comment: Glucose reference range applies only to samples taken after fasting for at least 8 hours.  Glucose, capillary     Status: Abnormal   Collection Time: 02/27/20  9:36 AM  Result Value Ref Range   Glucose-Capillary 267 (H) 70 - 99 mg/dL    Comment: Glucose reference range applies only to samples taken after fasting for at least 8 hours.  Glucose, capillary     Status: Abnormal   Collection Time: 02/27/20  11:40 AM  Result Value Ref Range   Glucose-Capillary 178 (H) 70 - 99 mg/dL    Comment: Glucose reference range applies only to samples taken after fasting for at least 8 hours.   CT HEAD WO CONTRAST  Result Date: 02/26/2020 CLINICAL DATA:  Mental status change with unknown cause.  Agitation. EXAM: CT HEAD WITHOUT CONTRAST TECHNIQUE: Contiguous axial images were obtained from the base of the skull through the vertex without intravenous contrast. COMPARISON:  Head CT from 6 days ago FINDINGS: Brain: No evidence of interval infarction, hemorrhage, hydrocephalus, extra-axial collection or mass lesion/mass effect. There are small subacute white matter infarcts which are underestimated relative to admission brain MRI, best visualized today in the right centrum semiovale. Vascular: No hyperdense vessel or unexpected calcification. Skull: Normal. Negative for fracture or focal lesion. Sinuses/Orbits: Partial right mastoid opacification. Right sphenoid and maxillary sinus fluid level. Sinus aeration is improved from prior. IMPRESSION: 1. Motion degraded head CT without new abnormality or reversible finding. 2. Improved sinus inflammation compared to 6 days ago. Electronically Signed   By: Marnee Spring M.D.   On: 02/26/2020 06:53   DG CHEST PORT 1 VIEW  Result Date: 02/26/2020 CLINICAL DATA:  Shortness of breath EXAM: PORTABLE CHEST 1 VIEW COMPARISON:  Chest  radiograph 02/21/2020 FINDINGS: Interval extubation. Enteric tube courses inferior to the diaphragm. Interval removal right upper extremity PICC line. Stable cardiac and mediastinal contours with low lung volumes elevation right hemidiaphragm. Similar mid and lower lung airspace opacities. No pleural effusion or pneumothorax. IMPRESSION: 1. Interval extubation. 2. Similar mid and lower lung airspace opacities. Electronically Signed   By: Annia Belt M.D.   On: 02/26/2020 07:11   EEG adult  Result Date: 02/26/2020 Rejeana Brock, MD     02/26/2020   4:42 PM History: 65 year old male being evaluated for delirium Sedation: Lorazepam given a few hours prior to study Technique: This is a 21 channel routine scalp EEG performed at the bedside with bipolar and monopolar montages arranged in accordance to the international 10/20 system of electrode placement. One channel was dedicated to EKG recording. Background: There is a well-formed posterior dominant rhythm of 9 Hz which is seen bioccipitally and is reactive to eye opening/closure..  In addition, there is an excessive amount of bifrontally predominant beta activity.  In addition there is diffuse intrusion of delta and theta range her regular activities throughout the study.  No epileptiform discharges were seen throughout the study. Photic stimulation: Physiologic driving is not performed EEG Abnormalities: 1) intrusion into the background of diffuse irregular generalized slow activity Clinical Interpretation: This EEG is consistent with a mild generalized nonspecific cerebral dysfunction (encephalopathy). There was no seizure or seizure predisposition recorded on this study. Please note that lack of epileptiform activity on EEG does not preclude the possibility of epilepsy. Ritta Slot, MD [facility] [phone] If 7pm- 7am, please page neurology on call as listed in AMION.       Medical Problem List and Plan: 1.  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  Antithrombotics: -DVT/anticoagulation: SCDs  -antiplatelet therapy: Aspirin 81 mg daily, Plavix 75 mg daily 3. Pain Management: Oxycodone as needed 4. Mood: Valproic acid 250 mg twice daily, Rozerem 8 mg nightly, melatonin 3 mg nightly as needed  -antipsychotic agents: Seroquel 50 mg nightly 5. Neuropsych: This patient is not capable of making decisions on his own  behalf. 6. Skin/Wound Care: Routine skin checks 7. Fluids/Electrolytes/Nutrition: Routine in and outs with follow-up chemistries 8.  Acute systolic congestive heart failure.  Continue low-dose Entresto 24-26 mg twice daily, Aldactone 25 mg daily and Coreg 3.125 mg twice daily 9.  Ileus.  Cortrak placed 02/21/2020 hoping to remove soon.  Diet slowly advanced. 10.  Diabetes mellitus.  Hemoglobin A1c 6.0.  Currently on Levemir 5 units daily.  Question change to Jardiance at discharge.  ***  Mcarthur Rossetti Hiroto Saltzman, PA-C 02/27/2020

## 2020-02-27 NOTE — Progress Notes (Signed)
Occupational Therapy Treatment Patient Details Name: Jeffrey Morse MRN: [id number] DOB: 14-Sep-1955 Today's Date: 02/27/2020    History of present illness 65 y.o. male hypertension, sleep apnea, hypercholesterolemia, was brought in for evaluation of what started like chest pain on 02/15/2020. Pt attempted to go to urgent care but collapsed when walking in and underwent CPR. Pt found to be in V fib by EMS, CPR of 15 minutes prior to ROSC. Pt went into PEA again in ambulance with further CPR of 5 minutes until ROSC. Pt was intubated and underwent emergent cath with successful stent placement to L circumflex artery and impella placement on 02/15/2020. 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  Pt was intubated 1/12- 1/18.  PT evaluated pt 1/15 and was total assist +2  but then signed off on 1/17 as pt was sedated and intubated and nurse asked PT to sign off. Reorder for PT on 1/19.   OT comments  Pt seen in conjunction with PT to maximize pts activity tolerance. Pt continues to present with impaired balance, decreased activity tolerance, L sided weakness and cognitive deficits impacting pts ability to complete BADLs independently. Pt able to complete functional mobility with RW and MIN A +2 for safety. Pt following all commands with increased time but continues to present with impulsivity in regards to safety awareness. Pt required cues to sequence oral care and to turn off water after task. Pt required up to MOD A to maintain standing balance at sink d/t L lateral lean and MOD A +2 to power into standing.Pt would continue to benefit from skilled occupational therapy while admitted and after d/c to address the below listed limitations in order to improve overall functional mobility and facilitate independence with BADL participation. DC plan remains appropriate, will follow acutely per POC.     Follow Up Recommendations  CIR    Equipment Recommendations  3 in 1 bedside  commode;Tub/shower seat;Wheelchair (measurements OT);Wheelchair cushion (measurements OT)    Recommendations for Other Services      Precautions / Restrictions Precautions Precautions: Fall Precaution Comments: foley, NG tube Restrictions Weight Bearing Restrictions: No       Mobility Bed Mobility Overal bed mobility: Needs Assistance Bed Mobility: Supine to Sit     Supine to sit: Min assist;+2 for safety/equipment     General bed mobility comments: Min assistance to move to edge of bed.  Once in sitting able to work on sock ADL with OTA.  Transfers Overall transfer level: Needs assistance Equipment used: Rolling walker (2 wheeled) Transfers: Sit to/from Stand Sit to Stand: Mod assist;+2 physical assistance;+2 safety/equipment;From elevated surface         General transfer comment: Pt performed mod +2 from elevated surface with poor hand placement and require hand over hand assistance to push into standing.  Pt holding to RW when returning to seated surface.  Pt with increased difficulty standing from recliner to sink.    Balance Overall balance assessment: Needs assistance Sitting-balance support: Single extremity supported;Feet supported Sitting balance-Leahy Scale: Fair     Standing balance support: Bilateral upper extremity supported;During functional activity;Single extremity supported Standing balance-Leahy Scale: Poor Standing balance comment: at least one UE supported at all times with tendency to lean laterally to L during standing ADLs                           ADL either performed or assessed with clinical judgement   ADL Overall  ADL's : Needs assistance/impaired     Grooming: Oral care;Standing;Supervision/safety;Cueing for sequencing;Minimal assistance;Moderate assistance Grooming Details (indicate cue type and reason): MOD A - MIN A at times for standing balance with pt heavily leaning to L side, supervision for sequencing task of oral  care.         Upper Body Dressing : Minimal assistance;Sitting Upper Body Dressing Details (indicate cue type and reason): to don gown as back side cover Lower Body Dressing: Minimal assistance;Sitting/lateral leans Lower Body Dressing Details (indicate cue type and reason): to don socks from EOB Toilet Transfer: Minimal assistance;RW;Ambulation;+2 for safety/equipment Toilet Transfer Details (indicate cue type and reason): MIN A +2 for safety for simulated toilet transfer         Functional mobility during ADLs: Minimal assistance;+2 for safety/equipment General ADL Comments: pt continues to present with impaired balance, decreased activity tolerance, L sided weakness and cognitive deficits     Vision       Perception     Praxis      Cognition Arousal/Alertness: Awake/alert Behavior During Therapy: Restless;Impulsive Overall Cognitive Status: Impaired/Different from baseline Area of Impairment: Following commands;Awareness;Safety/judgement;Orientation                 Orientation Level: Place;Disoriented to (kept asking to return to his room although pt in his room)     Following Commands: Follows one step commands with increased time;Follows multi-step commands with increased time Safety/Judgement: Decreased awareness of safety;Decreased awareness of deficits Awareness: Emergent   General Comments: very interactive today, increased command follow, has poor understanding of his deficits and how his deficits impact his safety        Exercises Other Exercises Other Exercises: self ROM shoulder flexion x5 reps seated in recliner   Shoulder Instructions       General Comments VSS, wife and sister during session    Pertinent Vitals/ Pain       Pain Assessment: No/denies pain Faces Pain Scale: No hurt  Home Living                                          Prior Functioning/Environment              Frequency  Min 2X/week         Progress Toward Goals  OT Goals(current goals can now be found in the care plan section)  Progress towards OT goals: Progressing toward goals  Acute Rehab OT Goals Patient Stated Goal: wants to return to work OT Goal Formulation: With patient Time For Goal Achievement: 03/03/20 Potential to Achieve Goals: Fair  Plan Discharge plan remains appropriate;Frequency remains appropriate    Co-evaluation      Reason for Co-Treatment: Complexity of the patient's impairments (multi-system involvement) PT goals addressed during session: Mobility/safety with mobility OT goals addressed during session: ADL's and self-care      AM-PAC OT "6 Clicks" Daily Activity     Outcome Measure   Help from another person eating meals?: A Lot Help from another person taking care of personal grooming?: A Lot Help from another person toileting, which includes using toliet, bedpan, or urinal?: A Lot Help from another person bathing (including washing, rinsing, drying)?: A Lot Help from another person to put on and taking off regular upper body clothing?: A Little Help from another person to put on and taking off regular lower body clothing?: A Little 6  Click Score: 14    End of Session Equipment Utilized During Treatment: Gait belt;Rolling walker  OT Visit Diagnosis: Other symptoms and signs involving the nervous system (R29.898)   Activity Tolerance Patient tolerated treatment well   Patient Left in chair;with call bell/phone within reach;with chair alarm set   Nurse Communication Mobility status        Time: 6433-2951 OT Time Calculation (min): 29 min  Charges: OT General Charges $OT Visit: 1 Visit OT Treatments $Self Care/Home Management : 8-22 mins  Lenor Derrick., COTA/L Acute Rehabilitation Services [phone]    Barron Schmid 02/27/2020, 1:33 PM

## 2020-02-27 NOTE — Progress Notes (Addendum)
Advanced Heart Failure Rounding Note   Subjective:    1/12 admitted for Vfib arrest, cath PCI/DES and impella 1/13 Echo EF ~50-55% LV small RV ok. 1/14 VT arrest, ROSC after 3 minutes.  1/15 Impella removed 1/16 Left sided weakness. MRI with multiple small infarcts and small ICH 1/18 Extubated.  1/22 Developed hyperactive delirium at night, head CT neg   Delirium again overnight, Head CT from yesterday negative, delirium improved this morning, not sleeping well at night.  Appetite slowly improving.  Objective:   Weight Range:  Vital Signs:   Temp:  [97.6 F (36.4 C)-99.4 F (37.4 C)] 98.2 F (36.8 C) (01/24 0756) Pulse Rate:  [84-118] 88 (01/24 0756) Resp:  [18-34] 18 (01/24 0756) BP: (104-121)/(64-85) 109/83 (01/24 0756) SpO2:  [85 %-98 %] 96 % (01/24 0756) Last BM Date: 02/26/20  Weight change: Filed Weights   02/23/20 0500 02/24/20 0338 02/25/20 0500  Weight: 97.9 kg 98.7 kg 99.1 kg    Intake/Output:   Intake/Output Summary (Last 24 hours) at 02/27/2020 0836 Last data filed at 02/27/2020 0700 Gross per 24 hour  Intake 1411.56 ml  Output 1725 ml  Net -313.44 ml     Physical Exam: Cardiac: JVD flat, normal rate and rhythm, clear s1 and s2, no murmurs, rubs or gallops, no LE edema Pulmonary: Coarse Breath sounds on right middle field, not in distress Abdominal: non distended abdomen, soft and nontender Psych: Alert, conversant, in good spirits Neuro: oriented to self and time, but not place.  Executive functioning in tact, Can add change,    Telemetry: SR 80s with occasional PVCs Personally reviewed.    Labs: Basic Metabolic Panel: Recent Labs  Lab 02/21/20 0452 02/21/20 0536 02/22/20 0456 02/22/20 1556 02/23/20 0355 02/24/20 0404 02/25/20 1046 02/26/20 0607 02/26/20 0857 02/27/20 0455  NA 141   < > 143   < > 142 139 136 136 132* 132*  K 3.0*   < > 2.8*   < > 3.6 3.9 4.4 4.6 4.5 4.5  CL 102   < > 108   < > 109 108 105  --  100 100  CO2 24    < > 24   < > 23 21* 21*  --  23 23  GLUCOSE 124*   < > 195*   < > 230* 255* 202*  --  290* 274*  BUN 22   < > 27*   < > 25* 18 20  --  21 26*  CREATININE 1.02   < > 0.87   < > 0.81 0.67 0.70  --  0.78 0.85  CALCIUM 8.4*   < > 8.8*   < > 8.1* 7.9* 9.0  --  8.8* 8.9  MG 1.9  --  2.2  --  2.0 1.9  --   --   --   --   PHOS 3.8  --  2.1*  --  2.7 3.0  --   --   --   --    < > = values in this interval not displayed.    Liver Function Tests: Recent Labs  Lab 02/24/20 0404  AST 133*  ALT 110*  ALKPHOS 141*  BILITOT 0.9  PROT 5.7*  ALBUMIN 2.2*   No results for input(s): LIPASE, AMYLASE in the last 168 hours. No results for input(s): AMMONIA in the last 168 hours.  CBC: Recent Labs  Lab 02/23/20 0355 02/24/20 0404 02/25/20 1046 02/26/20 0607 02/26/20 0857 02/27/20 0455  WBC 9.8  9.3 11.7*  --  12.1* 12.1*  HGB 8.7* 9.5* 10.7* 10.9* 11.1* 10.4*  HCT 26.0* 30.0* 32.4* 32.0* 33.7* 30.8*  MCV 88.7 89.8 88.0  --  88.0 86.5  PLT 284 355 523*  --  567* 640*    Cardiac Enzymes: No results for input(s): CKTOTAL, CKMB, CKMBINDEX, TROPONINI in the last 168 hours.  BNP: BNP (last 3 results) No results for input(s): BNP in the last 8760 hours.  ProBNP (last 3 results) No results for input(s): PROBNP in the last 8760 hours.    Other results:  Imaging: CT HEAD WO CONTRAST  Result Date: 02/26/2020 CLINICAL DATA:  Mental status change with unknown cause.  Agitation. EXAM: CT HEAD WITHOUT CONTRAST TECHNIQUE: Contiguous axial images were obtained from the base of the skull through the vertex without intravenous contrast. COMPARISON:  Head CT from 6 days ago FINDINGS: Brain: No evidence of interval infarction, hemorrhage, hydrocephalus, extra-axial collection or mass lesion/mass effect. There are small subacute white matter infarcts which are underestimated relative to admission brain MRI, best visualized today in the right centrum semiovale. Vascular: No hyperdense vessel or unexpected  calcification. Skull: Normal. Negative for fracture or focal lesion. Sinuses/Orbits: Partial right mastoid opacification. Right sphenoid and maxillary sinus fluid level. Sinus aeration is improved from prior. IMPRESSION: 1. Motion degraded head CT without new abnormality or reversible finding. 2. Improved sinus inflammation compared to 6 days ago. Electronically Signed   By: Marnee Spring M.D.   On: 02/26/2020 06:53   DG CHEST PORT 1 VIEW  Result Date: 02/26/2020 CLINICAL DATA:  Shortness of breath EXAM: PORTABLE CHEST 1 VIEW COMPARISON:  Chest radiograph 02/21/2020 FINDINGS: Interval extubation. Enteric tube courses inferior to the diaphragm. Interval removal right upper extremity PICC line. Stable cardiac and mediastinal contours with low lung volumes elevation right hemidiaphragm. Similar mid and lower lung airspace opacities. No pleural effusion or pneumothorax. IMPRESSION: 1. Interval extubation. 2. Similar mid and lower lung airspace opacities. Electronically Signed   By: Annia Belt M.D.   On: 02/26/2020 07:11   EEG adult  Result Date: 02/26/2020 Rejeana Brock, MD     02/26/2020  4:42 PM History: 65 year old male being evaluated for delirium Sedation: Lorazepam given a few hours prior to study Technique: This is a 21 channel routine scalp EEG performed at the bedside with bipolar and monopolar montages arranged in accordance to the international 10/20 system of electrode placement. One channel was dedicated to EKG recording. Background: There is a well-formed posterior dominant rhythm of 9 Hz which is seen bioccipitally and is reactive to eye opening/closure..  In addition, there is an excessive amount of bifrontally predominant beta activity.  In addition there is diffuse intrusion of delta and theta range her regular activities throughout the study.  No epileptiform discharges were seen throughout the study. Photic stimulation: Physiologic driving is not performed EEG Abnormalities: 1)  intrusion into the background of diffuse irregular generalized slow activity Clinical Interpretation: This EEG is consistent with a mild generalized nonspecific cerebral dysfunction (encephalopathy). There was no seizure or seizure predisposition recorded on this study. Please note that lack of epileptiform activity on EEG does not preclude the possibility of epilepsy. Ritta Slot, MD [facility] [phone] If 7pm- 7am, please page neurology on call as listed in AMION.     Medications:     Scheduled Medications: . aspirin  81 mg Oral Daily  . carvedilol  3.125 mg Per Tube BID WC  . Chlorhexidine Gluconate Cloth  6 each Topical  Daily  . clopidogrel  75 mg Per Tube Daily  . feeding supplement  237 mL Per Tube BID BM  . feeding supplement (PROSource TF)  45 mL Per Tube QID  . fiber  1 packet Per Tube BID  . insulin aspart  0-15 Units Subcutaneous TID AC & HS  . insulin detemir  5 Units Subcutaneous Daily  . mouth rinse  15 mL Mouth Rinse BID  . nystatin  5 mL Mouth/Throat QID  . pantoprazole sodium  40 mg Per Tube Daily  . QUEtiapine  12.5 mg Oral QHS  . sacubitril-valsartan  1 tablet Per Tube BID  . spironolactone  25 mg Per Tube Daily  . valproic acid  250 mg Per Tube BID    Infusions: . dexmedetomidine (PRECEDEX) IV infusion Stopped (02/26/20 1008)  . feeding supplement (OSMOLITE 1.5 CAL) 1,000 mL (02/26/20 1817)    PRN Medications: acetaminophen **OR** acetaminophen (TYLENOL) oral liquid 160 mg/5 mL **OR** acetaminophen, melatonin, oxyCODONE   Assessment/Plan:   1. Cardiac (VTarrest in setting of acute inferolateral STEMI/VT - OOH cardiac arrest on 1/12 with prolonged CPR, resuscitated w/ CPR + defibrillation   - Emergent cath 1/12 w/ totally occluded mid LCx treated w/ PCI + DES - Recurrent VT on 1/14 -> defib x 1 - Off amiodarone  - Supp K - Mag stable.  - continue low dose carvedilol  2. Acute systolic HF due in setting of acute MI ->  cardiogenic shock - EF 25% in cath lab 1/12 - Echo 1/13 EF 50-55% - Impella pulled 1/14, CVP 5-7 CO-OX remains stable. - Renal function stable.  - Continue low dose entresto, spiro 25mg , low dose carvedilol, no need for diuretic  3. CAD with acute inferolateral  STEMI  - Emergent cath 1/12 w/ totally occluded mid LCx treated w/ PCI + DES - There is moderate ostial LAD disease and significant disease in the distal LAD close to the apex. No significant disease affecting the right coronary artery. - Continue DAPT. Restart statin - Brilinta switched to Plavix; Plavix therapeutic per P2y12 lab.  - on low dose carvedilol  4. Acute CVA with ICH - left-sided weakness --> now not following commands; repeat CT shows stable hemorrhage - MRI with multiple small infarcts and small ICH (Likely cardioembolic from arrest) - Neuro following - Brilinta switched to Plavix, PT/OT/Speech  - L-sided deficits improved and working to transfer to CIR  5. Hyperactive Delirium -began on 1/22 at night -he was started on seroquel and valproic acid -has improved this am, add delirium precautions, ramelteon qhs  6. Acute hypoxic respiratory failure in setting of cardiac arrest - CXR atelectasis, CPT 8.54-->6 -> 3.7; FiO2 50% - Broadened abx d/t fever (Blood cx NGTD, sputum growing similar flora from previous respiratory cultures from 1/14)  - Extubated 02/21/20 - last dose of cefepime 1/22, last dose of vanc 1/19 procalcitonin normal 1/19  7. Ileus - Cor-trac placed 02/21/20 - resolved, no longer on reglan  8. AKI - due to ATN -> resolved   9. DM2 - HgbA1c 6.0, -SSI - Add Jardiance prior to d/c  10. Bradycardia -Resolved.   11.  Fever - 1/16 urine, blood and respiratory cultures sent NGTD - Has remained afebrile since finishing abx  12. R Cephalic superficial venous thrombosis - Continue to monitor - Elevate arm. Warm compresses   Length of Stay: 12   2/16 MD 02/27/2020, 8:36  AM  Advanced Heart Failure Team Pager [phone] (M-F; 7a - 4p)  Please contact [facility] for night-coverage after hours (4p -7a ) and weekends on [email or web address]  Patient seen and examined with the above-signed Advanced Practice Provider and/or Housestaff. I personally reviewed laboratory data, imaging studies and relevant notes. I independently examined the patient and formulated the important aspects of the plan. I have edited the note to reflect any of my changes or salient points. I have personally discussed the plan with the patient and/or family.  Still mildly confused but improved. Repeat head CT and EEG ok. Neuro started seroquel.   Denies CP or SOB.   General:  Sitting up. No resp difficulty HEENT: normal Neck: supple. no JVD. Carotids 2+ bilat; no bruits. No lymphadenopathy or thryomegaly appreciated. Cor: PMI nondisplaced. Regular rate & rhythm. No rubs, gallops or murmurs. Lungs: clear Abdomen: soft, nontender, nondistended. No hepatosplenomegaly. No bruits or masses. Good bowel sounds. Extremities: no cyanosis, clubbing, rash, edema Neuro: Mildly confused. Mild left sided weakness  Stable from HF/CAD standpoint. Struggling with delirium. Head CT and EEG reviewed. No acute changes. Will increase seroquel. Hopefully will be ready for CIR soon. Continue PT/OT.   Arvilla Meres, MD  12:20 PM

## 2020-02-27 NOTE — Plan of Care (Signed)
  Problem: Respiratory: Goal: Ability to maintain a clear airway and adequate ventilation will improve Outcome: Progressing   Problem: Role Relationship: Goal: Method of communication will improve Outcome: Progressing   Problem: Cardiovascular: Goal: Ability to achieve and maintain adequate cardiovascular perfusion will improve Outcome: Progressing

## 2020-02-27 NOTE — TOC Progression Note (Addendum)
Transition of Care [facility]) - Progression Note    Patient Details  Name: Jeffrey Morse MRN: [id number] Date of Birth: August 13, 1955  [facility] CM/SW Contact  Leone Haven, RN Phone Number: 02/27/2020, 6:19 PM  Clinical Narrative:    Patient transferred to unit from home, Vfib arrest, cath PCI/DES and impella removed, struggling with delirium, ct negative , eeg reviewed.  Per MD notes, plan for CIR , TOC will continue to follow.        Expected Discharge Plan and Services                                                 Social Determinants of Health (SDOH) Interventions    Readmission Risk Interventions No flowsheet data found.

## 2020-02-27 NOTE — Progress Notes (Signed)
Inpatient Rehabilitation Admissions Coordinator  I met with patient and wife at bedside. I await updated therapy treatments today, and then will begin insurance authorization for a possible [facility] admit this week.Wife is in agreement to plan.  Danne Baxter, RN, MSN Rehab Admissions Coordinator [phone] 02/27/2020 1:18 PM

## 2020-02-27 NOTE — Progress Notes (Addendum)
Physical Therapy Treatment Patient Details Name: Jeffrey Morse MRN: [id number] DOB: [birth year] Today's Date: 02/27/2020    History of Present Illness 65 y.o. male presented to ED found to be in vfib with two rounds of CPR by EMS after collapsing at urgent care ( started as chest pain) on 02/15/20. Pt was intubated and underwent emergent cath with successful stent placement to L circumflex artery and impella placement on 02/15/2020. Impella removed 02/17/2020, pt found now to have R thalamic bleed.  Intubated 1/12- 1/18.  PT eval 1/15 and was total assist +2. PT signed off 1/17. Reorder for PT on 1/19.  PMH: hypertension, sleep apnea, hypercholesterolemia.    PT Comments    Pt supine in bed on arrival this session and eager to move OOB.  Sister and wife present at bedside.  Pt able to move into standing with decreased assistance and progress to short bout of gt training with close chair follow.  He is impulsive and easily distracted.  Uses humor to disguise cognitive deficits.  Continue to recommend aggressive rehab in a post acute setting.     Follow Up Recommendations  CIR;Supervision/Assistance - 24 hour     Equipment Recommendations  Wheelchair (measurements PT);Wheelchair cushion (measurements PT);Hospital bed;Other (comment)    Recommendations for Other Services       Precautions / Restrictions Precautions Precautions: Fall Precaution Comments: foley, NG tube Restrictions Weight Bearing Restrictions: No    Mobility  Bed Mobility Overal bed mobility: Needs Assistance Bed Mobility: Supine to Sit     Supine to sit: Min assist;+2 for safety/equipment     General bed mobility comments: Min assistance to move to edge of bed.  Once in sitting able to work on sock ADL with OTA.  Transfers Overall transfer level: Needs assistance Equipment used: Rolling walker (2 wheeled) Transfers: Sit to/from Stand Sit to Stand: Mod assist;+2 physical assistance;+2 safety/equipment;From  elevated surface         General transfer comment: Pt performed mod +2 from elevated surface with poor hand placement and require hand over hand assistance to push into standing.  Pt holding to RW when returning to seated surface.  Pt with increased difficulty standing from recliner to sink.  Ambulation/Gait Ambulation/Gait assistance: Mod assist;+2 safety/equipment (close chair follow.) Gait Distance (Feet): 30 Feet Assistive device: Rolling walker (2 wheeled) Gait Pattern/deviations: Step-through pattern;Ataxic;Trunk flexed;Decreased stride length;Decreased step length - left;Decreased dorsiflexion - left     General Gait Details: Lists to teh L during gt training, required facilitation to weight shift during gt training.  Pt slow and fatigues quickly but able to progress to gt training.   Stairs             Wheelchair Mobility    Modified Rankin (Stroke Patients Only)       Balance Overall balance assessment: Needs assistance Sitting-balance support: Single extremity supported;Feet supported Sitting balance-Leahy Scale: Fair     Standing balance support: Bilateral upper extremity supported;During functional activity;Single extremity supported Standing balance-Leahy Scale: Poor Standing balance comment: at least one UE supported at all times with tendency to lean laterally to L during standing ADLs                            Cognition Arousal/Alertness: Awake/alert Behavior During Therapy: Restless;Impulsive Overall Cognitive Status: Impaired/Different from baseline Area of Impairment: Following commands;Awareness;Safety/judgement;Orientation                 Orientation Level: Place;Disoriented  to (kept asking to return to his room although pt in his room)     Following Commands: Follows one step commands with increased time;Follows multi-step commands with increased time Safety/Judgement: Decreased awareness of safety;Decreased awareness of  deficits Awareness: Emergent   General Comments: very interactive today, increased command follow, has poor understanding of his deficits and how his deficits impact his safety      Exercises Other Exercises Other Exercises: self ROM shoulder flexion x5 reps seated in recliner    General Comments General comments (skin integrity, edema, etc.): VSS, wife and sister during session      Pertinent Vitals/Pain Pain Assessment: No/denies pain Faces Pain Scale: No hurt    Home Living                      Prior Function            PT Goals (current goals can now be found in the care plan section) Acute Rehab PT Goals Patient Stated Goal: wants to return to work Potential to Achieve Goals: Fair Progress towards PT goals: Progressing toward goals    Frequency    Min 4X/week      PT Plan Current plan remains appropriate    Co-evaluation   Reason for Co-Treatment: Complexity of the patient's impairments (multi-system involvement) PT goals addressed during session: Mobility/safety with mobility OT goals addressed during session: ADL's and self-care      AM-PAC PT "6 Clicks" Mobility   Outcome Measure  Help needed turning from your back to your side while in a flat bed without using bedrails?: A Little Help needed moving from lying on your back to sitting on the side of a flat bed without using bedrails?: A Little Help needed moving to and from a bed to a chair (including a wheelchair)?: A Lot Help needed standing up from a chair using your arms (e.g., wheelchair or bedside chair)?: A Lot Help needed to walk in hospital room?: A Lot Help needed climbing 3-5 steps with a railing? : Total 6 Click Score: 13    End of Session Equipment Utilized During Treatment: Gait belt Activity Tolerance: Patient tolerated treatment well Patient left: with call bell/phone within reach;in chair;with family/visitor present;with chair alarm set (sitter alarm belt.) Nurse  Communication: Mobility status;Need for lift equipment (use stedy for back to bed.) PT Visit Diagnosis: Other abnormalities of gait and mobility (R26.89);Other symptoms and signs involving the nervous system (R29.898)     Time: 8563-1497 PT Time Calculation (min) (ACUTE ONLY): 29 min  Charges:  $Gait Training: 8-22 mins                     Bonney Leitz , PTA Acute Rehabilitation Services Pager [phone] Office [phone]     Jeffrey Morse Artis Delay 02/27/2020, 1:33 PM

## 2020-02-27 NOTE — Progress Notes (Signed)
Inpatient Diabetes Program Recommendations  AACE/ADA: New Consensus Statement on Inpatient Glycemic Control (2015)  Target Ranges:  Prepandial:   less than 140 mg/dL      Peak postprandial:   less than 180 mg/dL (1-2 hours)      Critically ill patients:  140 - 180 mg/dL   Lab Results  Component Value Date   GLUCAP 267 (H) 02/27/2020   HGBA1C 6.1 (H) 02/18/2020    Review of Glycemic Control Results for Jeffrey Morse, Jeffrey Morse (MRN [id number]) as of 02/27/2020 11:08  Ref. Range 02/26/2020 16:50 02/26/2020 19:46 02/26/2020 22:01 02/27/2020 00:05 02/27/2020 04:19 02/27/2020 07:53 02/27/2020 09:36  Glucose-Capillary Latest Ref Range: 70 - 99 mg/dL 768 (H) 115 (H) 726 (H) 216 (H) 228 (H) 262 (H) 267 (H)   Current orders for Inpatient glycemic control: Novolog 0-15 units TID & HS Cortrak- Osmolite 1.5 @ 85 ml/hr from 1800-0800  Inpatient Diabetes Program Recommendations:    With current poor oral intake and nocturnal feeds, would recommend changing correction to Novolog 0-15 units Q4H.   Thank you, Billy Fischer. Avie Checo, RN, MSN, CDE  Diabetes Coordinator Inpatient Glycemic Control Team Team Pager [phone] (8am-5pm) 02/27/2020 11:10 AM

## 2020-02-27 NOTE — Progress Notes (Addendum)
STROKE TEAM PROGRESS NOTE     INTERVAL HISTORY Patient remained agitated last night.  Last received Precedex at 10 am  yesterday.  Received Depakote and Seroquel yesterday as well. Patient evaluated at bedside this morning, with sister present in the room.  Patient is alert, awake, oriented to time place and person.  He is improving neurologically, CT scan does not show any acute abnormality, EEG is negative for seizures.  Patient received 12.5 mg Seroquel yesterday so plan is to increase it to 25 mg to help him sleep better and avoid agitation.  Strict delirium precautions.  Blood pressure well controlled.  Dr. Gala Romney is updated about the patient's current condition and patient is stable from stroke team point of view so, stroke team will sign off.  Neurological exam improving.  Vitals:   02/26/20 1948 02/26/20 2100 02/27/20 0039 02/27/20 0421  BP: 104/64 115/72 104/65 112/78  Pulse: 89 88 88 87  Resp: 20 (!) 21 (!) 22 (!) 24  Temp: 98.3 F (36.8 C)  97.6 F (36.4 C) 98.2 F (36.8 C)  TempSrc: Oral  Axillary Oral  SpO2: 93% 91% 94% 93%  Weight:      Height:       CBC:  Recent Labs  Lab 02/26/20 0857 02/27/20 0455  WBC 12.1* 12.1*  HGB 11.1* 10.4*  HCT 33.7* 30.8*  MCV 88.0 86.5  PLT 567* 640*   Basic Metabolic Panel:  Recent Labs  Lab 02/23/20 0355 02/24/20 0404 02/25/20 1046 02/26/20 0857 02/27/20 0455  NA 142 139   < > 132* 132*  K 3.6 3.9   < > 4.5 4.5  CL 109 108   < > 100 100  CO2 23 21*   < > 23 23  GLUCOSE 230* 255*   < > 290* 274*  BUN 25* 18   < > 21 26*  CREATININE 0.81 0.67   < > 0.78 0.85  CALCIUM 8.1* 7.9*   < > 8.8* 8.9  MG 2.0 1.9  --   --   --   PHOS 2.7 3.0  --   --   --    < > = values in this interval not displayed.    IMAGING  VAS [place] Lower extremity 02/21/2020 Summary:  BILATERAL:  - No evidence of deep vein thrombosis seen in the lower extremities,  bilaterally.  - No evidence of superficial venous thrombosis in the lower  extremities,  bilaterally.  -No evidence of popliteal cyst, bilaterally.   CT Head wo contrast 02/20/2019 IMPRESSION: 1. Unchanged punctate focus of blood at the right thalamus. 2. Faint hypoattenuation in the frontal lobes correspond to known recent infarcts.  CT Head Wo Contrast 02/17/2020 IMPRESSION:  1. Subcentimeter hyperdense focus within the right thalamic region, suspicious for a small acute bleed. MRI correlation is recommended.  2. Mild cerebral atrophy.  3. Moderate to marked severity paranasal sinus disease.   CT Head Wo Contrast 02/26/2020 IMPRESSION: 1. Motion degraded head CT without new abnormality or reversible finding. 2. Improved sinus inflammation compared to 6 days ago.  EEG  02/26/2020  Clinical Interpretation: This EEG is consistent with a mild generalized nonspecific cerebral dysfunction (encephalopathy). There was no seizure or seizure predisposition recorded on this study. Please note that lack of epileptiform activity on EEG does not preclude the possibility of epilepsy.   MR BRAIN WO CONTRAST MR ANGIO HEAD WO CONTRAST 02/17/2020  IMPRESSION:  1. Multiple small acute infarcts within the frontal lobes.  2. New right  thalamic microhemorrhage, since 01/18/12.  3. Normal intracranial MRA.   DG CHEST PORT 1 VIEW 02/16/2020 IMPRESSION:  Low lung volumes persist, with likely atelectasis at the lung bases and no overt edema or confluent airspace disease. Unchanged position of endotracheal tube, gastric tube, right IJ sheath/Swan-Ganz.   DG Abd Portable 1V 02/17/2020 IMPRESSION:  Enteric tube in satisfactory position, nonobstructive bowel gas pattern.   ECHOCARDIOGRAM LIMITED 02/16/2020 IMPRESSIONS   1. Left ventricular ejection fraction, by estimation, is 50 to 55%. The left ventricle has mildly decreased function. The left ventricle demonstrates regional wall motion abnormalities. Posterior wall hypokinesis.   2. Impella measures about 3cm into LV  cavity from aortic valve   3. Right ventricular systolic function is normal. The right ventricular size is normal.   Bilateral Carotid Dopplers  02/18/2020 Bilateral mild thickening without hemodynamically significant stenosis.  Vertebral artery flow is antegrade on the left not visualized on the right.  VAS [place] upper extremity Venous Duplex Right 02/20/2020  Summary:    Right:  No evidence of deep vein thrombosis in the upper extremity. Findings  consistent  with acute superficial vein thrombosis involving the right cephalic vein.    Left:  No evidence of thrombosis in the subclavian.   PHYSICAL EXAM  General: Well-developed well-nourished male who is not in distress.   HEENT: [place]/AT, endotracheal tube in place Cardiovascular: Rate and rhythm Pulmonary: Clear to auscultation bilaterally Abdomen: Soft, nondistended, nontender Extremities: No peripheral edema Neurological:  Mental Status:  Alert, awake, oriented to time, place and person.  Able to follow commands without any difficulty .  Fair recall and memory Cranial Nerves: II III,IV, VI: pupils equal, round, reactive to light and accommodation, slight skew deviation with right eye hypertropia.  V,VII: normal VIII: hearing normal  IX,X: Uvula rises symmetrically XI: Unable to perform XII: Midline tongue extension without atrophy or fasciculations  Motor: Right :  Upper extremity   4/5                                      Left:     Upper extremity   3/5             Lower extremity   4/5                                                  Lower extremity   3/5 Tone and bulk:normal tone throughout; no atrophy noted Bilateral upper extremity action tremor noted.  No flaps or asterixis. Sensory: Pinprick and light touch normal on in RUE and RLE, decreased on LUE and LLE Deep Tendon Reflexes:  Right: Upper Extremity                       Left: Upper extremity   biceps (C-5 to C-6) 2/4                       biceps (C-5 to  C-6) 3/4 tricep (C7) 2/4                                     triceps (C7) 3/4 Brachioradialis (C6) 2/4  Brachioradialis (C6) 3/4  Lower Extremity Lower Extremity  quadriceps (L-2 to L-4) 2/4                 quadriceps (L-2 to L-4) 3/4 Achilles (S1) 2/4                                  Achilles (S1) 3/4  Plantars: Right: downgoing                                Left: downgoing Cerebellar: Finger-to-nose normal Gait: Deferred   ASSESSMENT/PLAN [name] is a 65 y.o. male with history of hypertension, hyperlipidemia and OSA presenting 02/15/20 with cardiac arrest with successful PCI and drug-eluting stent placement left circumflex complicated by hypotension (Impella device) with subsequent left sided weakness. He did not receive IV t-PA due to late presentation (>4.5 hours from time of onset).   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  As well as embolization from distal clot on the Impella device   CT head: Subcentimeter hyperdense focus within the right thalamic region, suspicious for a small acute bleed. MRI correlation is recommended. Mild cerebral atrophy.   MRI : Multiple small acute infarcts within the frontal lobes. New right thalamic microhemorrhage, since 01/18/12.  MRA : Normal intracranial MRA.  Carotid Doppler: Bilateral mild thickening without hemodynamically significant stenosis.  Vertebral artery flow is antegrade on the left not visualized on the right.  2D Echo: EF 50 - 55%. No cardiac source of emboli identified.   VAS [place] upper extremity: Acute superficial vein thrombosis involving the right cephalic vein.  S[place] Corona Virus 2 - negative  LDL 135  HgbA1c 6.1  VTE prophylaxis - none  No antithrombotic prior to admission, now on aspirin 81 mg daily and clopidogrel 75 mg daily.   Patient will be counseled to be compliant with his antithrombotic  medications  Ongoing aggressive stroke risk factor management  Therapy recommendations: CIR  Disposition:  pending  Delirium / encephalopathy  likely multifactorial due to ongoing medical conditions, chronic back pain as well as effect of sedating medications  CT Head 02/26/20 -  Motion degraded head CT without new abnormality or reversible finding. Improved sinus inflammation compared to 6 days ago.  EEG no seizure  Clinically improved  On seroquel 12.5mg  -> 25mg   On depakote bid  Hypertension  Home BP meds: Zestril  Current BP meds: norepinephrine ; vasopressin  Stable  Long-term BP goal normotensive  Hyperlipidemia  Home Lipid lowering medication: Zocor 20 mg daily  LDL 135, goal < 70   Current lipid lowering medication: none due to elevated LFTs   Consider lipitor 40 once LFT normalizes  Other Active Problems, Findings, Recommendations and/or Plan  Code status -  Full  Hyperglycemia ->SSI    Anemia - Hgb - 9.0  HCAP vs Aspiration pneumonia - Rocephin ->Vancomycin->Cefepime on 1/16.   Leukocytosis - WBC's - 12.1 (afebrile)   Hospital day # 12  ATTENDING NOTE: I reviewed above note and agree with the assessment and plan. Pt was seen and examined.   65 year old male with history of hypertension, hyperlipidemia, OSA admitted for cardio arrest x2 status post CPR.  Found to have STEMI status post PCI and stenting.  Put on Impala device due to hypotension, which was removed 1/15.  On 1/16 patient found to have left-sided weakness, and CT showed right thalamic small bleed.  MRI confirmed right thalamic microhemorrhage versus hemorrhagic transformation but also found several punctate infarcts bilateral frontal area.  MRA negative.  Carotid Doppler negative.  EF 50 to 55%.  LDL 135 and A1c 6.1.  Patient was put on dual antiplatelet with aspirin Plavix.  Stroke embolic pattern, etiology likely due to procedures related CPR and PCI/stenting.  On 1/22, patient  found to have delirium, will put on Precedex, CT repeat no acute finding.  EEG negative for seizure.  Neurology was called back and recommend Seroquel 12.5 and Depakote twice daily.  Per report, last night again did not sleep well even with Seroquel, however this morning neurologically improved.  Creatinine 0.85, WBC stable 12.1.  On exam, wife at bedside.  Patient lying in bed, orientated to place people but not to time.  Decreased concentration and attention, able to spell forward "world" but not backward.  No aphasia, able to name and repeat, follow simple commands.  Visual field full, no gaze preference, PERRL.  No significant facial droop, tongue midline.  Normal movement right upper and lower extremity, mild hemiparesis left upper and lower extremities 4/5.  Sensation symmetrical.  Finger-to-nose intact bilaterally.  Gait not tested.  Patient recent delirium likely encephalopathy from multiple source, including his medical conditions as well as medication effect.  Wife also complained of patient likely agitated due to chronic back pain did not gain enough attention.  He was on Depakote and Seroquel yesterday, however no significant effect.  Given low-dose Seroquel, I recommend to increase from 12.5 to 25 mg tonight.  Continue to treat underlying conditions.  Neurology will sign off. Please call with questions. Pt will follow up with stroke clinic NP at [facility] in about 4 weeks. Thanks for the consult.   Marvel Plan, MD PhD Stroke Neurology 02/27/2020 3:25 PM        To contact Stroke Continuity provider, please refer to [email or web address]. After hours, contact General Neurology

## 2020-02-28 ENCOUNTER — Encounter (HOSPITAL_COMMUNITY): Payer: Self-pay | Admitting: Physical Medicine & Rehabilitation

## 2020-02-28 ENCOUNTER — Inpatient Hospital Stay (HOSPITAL_COMMUNITY)
Admission: RE | Admit: 2020-02-28 | Discharge: 2020-03-09 | DRG: 056 | Disposition: A | Discharge status: Home / Self Care | Payer: BC Managed Care – PPO | Source: Intra-hospital | Attending: Physical Medicine & Rehabilitation | Admitting: Physical Medicine & Rehabilitation

## 2020-02-28 ENCOUNTER — Other Ambulatory Visit: Payer: Self-pay

## 2020-02-28 ENCOUNTER — Encounter: Payer: Self-pay | Admitting: Pulmonary Disease

## 2020-02-28 DIAGNOSIS — Z823 Family history of stroke: Secondary | ICD-10-CM

## 2020-02-28 DIAGNOSIS — K567 Ileus, unspecified: Secondary | ICD-10-CM | POA: Diagnosis present

## 2020-02-28 DIAGNOSIS — I639 Cerebral infarction, unspecified: Secondary | ICD-10-CM | POA: Diagnosis not present

## 2020-02-28 DIAGNOSIS — Z8674 Personal history of sudden cardiac arrest: Secondary | ICD-10-CM | POA: Diagnosis not present

## 2020-02-28 DIAGNOSIS — R1310 Dysphagia, unspecified: Secondary | ICD-10-CM | POA: Diagnosis present

## 2020-02-28 DIAGNOSIS — I951 Orthostatic hypotension: Secondary | ICD-10-CM | POA: Diagnosis present

## 2020-02-28 DIAGNOSIS — I5021 Acute systolic (congestive) heart failure: Secondary | ICD-10-CM | POA: Diagnosis present

## 2020-02-28 DIAGNOSIS — I2510 Atherosclerotic heart disease of native coronary artery without angina pectoris: Secondary | ICD-10-CM | POA: Diagnosis present

## 2020-02-28 DIAGNOSIS — I2119 ST elevation (STEMI) myocardial infarction involving other coronary artery of inferior wall: Secondary | ICD-10-CM | POA: Diagnosis present

## 2020-02-28 DIAGNOSIS — Z87891 Personal history of nicotine dependence: Secondary | ICD-10-CM

## 2020-02-28 DIAGNOSIS — E785 Hyperlipidemia, unspecified: Secondary | ICD-10-CM | POA: Diagnosis present

## 2020-02-28 DIAGNOSIS — Z800 Family history of malignant neoplasm of digestive organs: Secondary | ICD-10-CM | POA: Diagnosis not present

## 2020-02-28 DIAGNOSIS — E119 Type 2 diabetes mellitus without complications: Secondary | ICD-10-CM | POA: Diagnosis present

## 2020-02-28 DIAGNOSIS — R410 Disorientation, unspecified: Secondary | ICD-10-CM | POA: Diagnosis present

## 2020-02-28 DIAGNOSIS — K5900 Constipation, unspecified: Secondary | ICD-10-CM | POA: Diagnosis present

## 2020-02-28 DIAGNOSIS — R451 Restlessness and agitation: Secondary | ICD-10-CM | POA: Diagnosis present

## 2020-02-28 DIAGNOSIS — I69354 Hemiplegia and hemiparesis following cerebral infarction affecting left non-dominant side: Secondary | ICD-10-CM | POA: Diagnosis present

## 2020-02-28 DIAGNOSIS — I110 Hypertensive heart disease with heart failure: Secondary | ICD-10-CM | POA: Diagnosis present

## 2020-02-28 HISTORY — DX: Cerebral infarction, unspecified: I63.9

## 2020-02-28 LAB — HEPATIC FUNCTION PANEL
ALT: 72 U/L — ABNORMAL HIGH (ref 0–44)
AST: 33 U/L (ref 15–41)
Albumin: 2.7 g/dL — ABNORMAL LOW (ref 3.5–5.0)
Alkaline Phosphatase: 160 U/L — ABNORMAL HIGH (ref 38–126)
Bilirubin, Direct: 0.2 mg/dL (ref 0.0–0.2)
Indirect Bilirubin: 0.4 mg/dL (ref 0.3–0.9)
Total Bilirubin: 0.6 mg/dL (ref 0.3–1.2)
Total Protein: 6.8 g/dL (ref 6.5–8.1)

## 2020-02-28 LAB — BASIC METABOLIC PANEL
Anion gap: 10 (ref 5–15)
BUN: 35 mg/dL — ABNORMAL HIGH (ref 8–23)
CO2: 23 mmol/L (ref 22–32)
Calcium: 8.9 mg/dL (ref 8.9–10.3)
Chloride: 99 mmol/L (ref 98–111)
Creatinine, Ser: 0.92 mg/dL (ref 0.61–1.24)
GFR, Estimated: >60 mL/min (ref >60)
Glucose, Bld: 204 mg/dL — ABNORMAL HIGH (ref 70–99)
Potassium: 4.5 mmol/L (ref 3.5–5.1)
Sodium: 132 mmol/L — ABNORMAL LOW (ref 135–145)

## 2020-02-28 LAB — GLUCOSE, CAPILLARY
Glucose-Capillary: 202 mg/dL — ABNORMAL HIGH (ref 70–99)
Glucose-Capillary: 242 mg/dL — ABNORMAL HIGH (ref 70–99)
Glucose-Capillary: 247 mg/dL — ABNORMAL HIGH (ref 70–99)
Glucose-Capillary: 150 mg/dL — ABNORMAL HIGH (ref 70–99)
Glucose-Capillary: 96 mg/dL (ref 70–99)
Glucose-Capillary: 117 mg/dL — ABNORMAL HIGH (ref 70–99)

## 2020-02-28 LAB — CBC
HCT: 32.6 % — ABNORMAL LOW (ref 39.0–52.0)
Hemoglobin: 10.6 g/dL — ABNORMAL LOW (ref 13.0–17.0)
MCH: 28.6 pg (ref 26.0–34.0)
MCHC: 32.5 g/dL (ref 30.0–36.0)
MCV: 88.1 fL (ref 80.0–100.0)
Platelets: 749 10*3/uL — ABNORMAL HIGH (ref 150–400)
RBC: 3.7 MIL/uL — ABNORMAL LOW (ref 4.22–5.81)
RDW: 14.8 % (ref 11.5–15.5)
WBC: 11.2 10*3/uL — ABNORMAL HIGH (ref 4.0–10.5)
nRBC: 0 % (ref 0.0–0.2)

## 2020-02-28 MED ORDER — MELATONIN 3 MG PO TABS
3.0000 mg | ORAL_TABLET | Freq: Every evening | ORAL | Status: DC | PRN
  Administered 2020-02-29 – 2020-03-01 (×3): 3 mg
  Filled 2020-02-28 (×3): qty 1

## 2020-02-28 MED ORDER — ASPIRIN 81 MG PO CHEW: 81.0000 mg | CHEWABLE_TABLET | Freq: Every day | ORAL | Status: DC

## 2020-02-28 MED ORDER — INSULIN GLARGINE 100 UNIT/ML ~~LOC~~ SOLN
9.0000 [IU] | Freq: Every day | SUBCUTANEOUS | Status: DC
  Filled 2020-02-28: qty 0.09

## 2020-02-28 MED ORDER — QUETIAPINE FUMARATE 50 MG PO TABS: 50.0000 mg | ORAL_TABLET | Freq: Every day | ORAL | Status: DC

## 2020-02-28 MED ORDER — ORAL CARE MOUTH RINSE: 15.0000 mL | Freq: Two times a day (BID) | OROMUCOSAL | 0 refills | Status: DC

## 2020-02-28 MED ORDER — ROSUVASTATIN CALCIUM 20 MG PO TABS
40.0000 mg | ORAL_TABLET | Freq: Every day | ORAL | Status: DC
  Administered 2020-02-29 – 2020-03-02 (×3): 40 mg
  Filled 2020-02-28 (×3): qty 2

## 2020-02-28 MED ORDER — INSULIN DEGLUDEC 100 UNIT/ML ~~LOC~~ SOPN: 9.0000 [IU] | PEN_INJECTOR | Freq: Every day | SUBCUTANEOUS | Status: DC

## 2020-02-28 MED ORDER — OSMOLITE 1.5 CAL PO LIQD: 1000.0000 mL | ORAL | 0 refills | Status: DC

## 2020-02-28 MED ORDER — INSULIN ASPART 100 UNIT/ML ~~LOC~~ SOLN: 2.0000 [IU] | Freq: Three times a day (TID) | SUBCUTANEOUS | Status: DC

## 2020-02-28 MED ORDER — CARVEDILOL 3.125 MG PO TABS: 3.1250 mg | ORAL_TABLET | Freq: Two times a day (BID) | ORAL | Status: DC

## 2020-02-28 MED ORDER — NUTRISOURCE FIBER PO PACK: 1.0000 | PACK | Freq: Two times a day (BID) | ORAL | Status: DC

## 2020-02-28 MED ORDER — OXYCODONE HCL 5 MG PO TABS
5.0000 mg | ORAL_TABLET | Freq: Once | ORAL | 0 refills | Status: DC | PRN
Start: 2020-02-28 — End: 2020-03-09

## 2020-02-28 MED ORDER — ACETAMINOPHEN 650 MG RE SUPP: 650.0000 mg | RECTAL | Status: DC | PRN

## 2020-02-28 MED ORDER — INSULIN GLARGINE 100 UNIT/ML ~~LOC~~ SOLN
9.0000 [IU] | Freq: Every day | SUBCUTANEOUS | Status: DC
  Administered 2020-02-28 – 2020-03-06 (×8): 9 [IU] via SUBCUTANEOUS
  Filled 2020-02-28 (×9): qty 0.09

## 2020-02-28 MED ORDER — PROSOURCE TF PO LIQD: 45.0000 mL | Freq: Four times a day (QID) | ORAL | Status: DC

## 2020-02-28 MED ORDER — INSULIN ASPART 100 UNIT/ML ~~LOC~~ SOLN: 0.0000 [IU] | Freq: Three times a day (TID) | SUBCUTANEOUS | 11 refills | Status: DC

## 2020-02-28 MED ORDER — INSULIN ASPART 100 UNIT/ML ~~LOC~~ SOLN
0.0000 [IU] | Freq: Three times a day (TID) | SUBCUTANEOUS | Status: DC
  Administered 2020-02-29: 3 [IU] via SUBCUTANEOUS
  Administered 2020-02-29: 2 [IU] via SUBCUTANEOUS
  Administered 2020-02-29: 3 [IU] via SUBCUTANEOUS
  Administered 2020-03-01 (×3): 2 [IU] via SUBCUTANEOUS
  Administered 2020-03-02 – 2020-03-04 (×4): 3 [IU] via SUBCUTANEOUS
  Administered 2020-03-04 – 2020-03-07 (×3): 2 [IU] via SUBCUTANEOUS
  Administered 2020-03-07: 3 [IU] via SUBCUTANEOUS
  Administered 2020-03-08: 2 [IU] via SUBCUTANEOUS
  Administered 2020-03-08 (×2): 3 [IU] via SUBCUTANEOUS

## 2020-02-28 MED ORDER — ROSUVASTATIN CALCIUM 20 MG PO TABS
40.0000 mg | ORAL_TABLET | Freq: Every day | ORAL | Status: DC
  Administered 2020-02-28: 40 mg
  Filled 2020-02-28: qty 2

## 2020-02-28 MED ORDER — PROSOURCE TF PO LIQD
45.0000 mL | Freq: Four times a day (QID) | ORAL | Status: DC
  Administered 2020-02-28 – 2020-02-29 (×2): 45 mL
  Filled 2020-02-28 (×3): qty 45

## 2020-02-28 MED ORDER — INSULIN GLARGINE 100 UNIT/ML ~~LOC~~ SOLN: 9.0000 [IU] | Freq: Every day | SUBCUTANEOUS | 11 refills | Status: DC

## 2020-02-28 MED ORDER — SACUBITRIL-VALSARTAN 24-26 MG PO TABS: 1.0000 | ORAL_TABLET | Freq: Two times a day (BID) | ORAL | Status: DC

## 2020-02-28 MED ORDER — RAMELTEON 8 MG PO TABS: 8.0000 mg | ORAL_TABLET | Freq: Every day | ORAL | Status: DC

## 2020-02-28 MED ORDER — SPIRONOLACTONE 25 MG PO TABS
25.0000 mg | ORAL_TABLET | Freq: Every day | ORAL | Status: DC
  Administered 2020-02-29 – 2020-03-02 (×3): 25 mg
  Filled 2020-02-28 (×3): qty 1

## 2020-02-28 MED ORDER — PANTOPRAZOLE SODIUM 40 MG PO PACK
40.0000 mg | PACK | Freq: Every day | ORAL | Status: DC
  Administered 2020-02-29 – 2020-03-02 (×3): 40 mg
  Filled 2020-02-28 (×3): qty 20

## 2020-02-28 MED ORDER — RAMELTEON 8 MG PO TABS
8.0000 mg | ORAL_TABLET | Freq: Every day | ORAL | Status: DC
  Administered 2020-02-28 – 2020-03-06 (×8): 8 mg via ORAL
  Filled 2020-02-28 (×8): qty 1

## 2020-02-28 MED ORDER — ROSUVASTATIN CALCIUM 40 MG PO TABS: 40.0000 mg | ORAL_TABLET | Freq: Every day | ORAL | Status: DC

## 2020-02-28 MED ORDER — CLOPIDOGREL BISULFATE 75 MG PO TABS: 75.0000 mg | ORAL_TABLET | Freq: Every day | ORAL | Status: DC

## 2020-02-28 MED ORDER — ACETAMINOPHEN 325 MG PO TABS
650.0000 mg | ORAL_TABLET | ORAL | Status: DC | PRN
  Administered 2020-02-29 – 2020-03-01 (×4): 650 mg
  Filled 2020-02-28 (×4): qty 2

## 2020-02-28 MED ORDER — EMPAGLIFLOZIN 10 MG PO TABS: 10.0000 mg | ORAL_TABLET | Freq: Every day | ORAL | Status: DC

## 2020-02-28 MED ORDER — EMPAGLIFLOZIN 10 MG PO TABS
10.0000 mg | ORAL_TABLET | Freq: Every day | ORAL | Status: DC
  Administered 2020-02-29 – 2020-03-05 (×6): 10 mg via ORAL
  Filled 2020-02-28 (×6): qty 1

## 2020-02-28 MED ORDER — CARVEDILOL 3.125 MG PO TABS
3.1250 mg | ORAL_TABLET | Freq: Two times a day (BID) | ORAL | Status: DC
  Administered 2020-02-28 – 2020-03-02 (×7): 3.125 mg
  Filled 2020-02-28 (×7): qty 1

## 2020-02-28 MED ORDER — NYSTATIN 100000 UNIT/ML MT SUSP: 5.0000 mL | Freq: Four times a day (QID) | OROMUCOSAL | 0 refills | Status: DC

## 2020-02-28 MED ORDER — ASPIRIN 81 MG PO CHEW
81.0000 mg | CHEWABLE_TABLET | Freq: Every day | ORAL | Status: DC
  Administered 2020-02-29 – 2020-03-09 (×10): 81 mg via ORAL
  Filled 2020-02-28 (×10): qty 1

## 2020-02-28 MED ORDER — SACUBITRIL-VALSARTAN 24-26 MG PO TABS
1.0000 | ORAL_TABLET | Freq: Two times a day (BID) | ORAL | Status: DC
  Administered 2020-02-28 – 2020-03-02 (×7): 1
  Filled 2020-02-28 (×8): qty 1

## 2020-02-28 MED ORDER — CHLORHEXIDINE GLUCONATE CLOTH 2 % EX PADS: 6.0000 | MEDICATED_PAD | Freq: Every day | CUTANEOUS | Status: DC

## 2020-02-28 MED ORDER — ACETAMINOPHEN 325 MG PO TABS: 650.0000 mg | ORAL_TABLET | ORAL | Status: DC | PRN

## 2020-02-28 MED ORDER — PANTOPRAZOLE SODIUM 40 MG PO PACK: 40.0000 mg | PACK | Freq: Every day | ORAL | Status: DC

## 2020-02-28 MED ORDER — VALPROIC ACID 250 MG/5ML PO SOLN: 250.0000 mg | Freq: Two times a day (BID) | ORAL | Status: DC

## 2020-02-28 MED ORDER — NUTRISOURCE FIBER PO PACK
1.0000 | PACK | Freq: Two times a day (BID) | ORAL | Status: DC
  Administered 2020-02-28 – 2020-02-29 (×2): 1
  Filled 2020-02-28 (×3): qty 1

## 2020-02-28 MED ORDER — OXYCODONE HCL 5 MG PO TABS
5.0000 mg | ORAL_TABLET | Freq: Once | ORAL | Status: AC | PRN
Start: 2020-02-28 — End: 2020-03-01
  Administered 2020-03-01: 5 mg via ORAL
  Filled 2020-02-28: qty 1

## 2020-02-28 MED ORDER — EMPAGLIFLOZIN 10 MG PO TABS
10.0000 mg | ORAL_TABLET | Freq: Every day | ORAL | Status: DC
  Administered 2020-02-28: 10 mg via ORAL
  Filled 2020-02-28: qty 1

## 2020-02-28 MED ORDER — ACETAMINOPHEN 160 MG/5ML PO SOLN
650.0000 mg | ORAL | Status: DC | PRN
  Administered 2020-02-29: 650 mg
  Filled 2020-02-28: qty 20.3

## 2020-02-28 MED ORDER — CLOPIDOGREL BISULFATE 75 MG PO TABS
75.0000 mg | ORAL_TABLET | Freq: Every day | ORAL | Status: DC
  Administered 2020-02-29 – 2020-03-02 (×3): 75 mg
  Filled 2020-02-28 (×3): qty 1

## 2020-02-28 MED ORDER — VALPROIC ACID 250 MG/5ML PO SOLN
250.0000 mg | Freq: Two times a day (BID) | ORAL | Status: DC
  Administered 2020-02-28 – 2020-03-02 (×7): 250 mg
  Filled 2020-02-28 (×9): qty 5

## 2020-02-28 MED ORDER — SPIRONOLACTONE 25 MG PO TABS: 25.0000 mg | ORAL_TABLET | Freq: Every day | ORAL | Status: DC

## 2020-02-28 MED ORDER — OSMOLITE 1.5 CAL PO LIQD
1000.0000 mL | ORAL | Status: DC
  Filled 2020-02-28: qty 1000

## 2020-02-28 MED ORDER — ENSURE ENLIVE PO LIQD: 237.0000 mL | Freq: Two times a day (BID) | ORAL | Status: DC

## 2020-02-28 MED ORDER — QUETIAPINE FUMARATE 50 MG PO TABS
50.0000 mg | ORAL_TABLET | Freq: Every day | ORAL | Status: DC
  Administered 2020-02-28 – 2020-03-02 (×4): 50 mg via ORAL
  Filled 2020-02-28 (×4): qty 1

## 2020-02-28 MED ORDER — NYSTATIN 100000 UNIT/ML MT SUSP
5.0000 mL | Freq: Four times a day (QID) | OROMUCOSAL | Status: DC
  Administered 2020-02-28 – 2020-03-09 (×36): 500000 [IU] via OROMUCOSAL
  Filled 2020-02-28 (×40): qty 5

## 2020-02-28 MED ORDER — ENSURE ENLIVE PO LIQD: 237.0000 mL | Freq: Two times a day (BID) | ORAL | 12 refills | Status: DC

## 2020-02-28 NOTE — Discharge Summary (Addendum)
Advanced Heart Failure Discharge Note  Discharge Summary   Patient ID: Jeffrey Morse MRN: [id number], DOB/AGE: 10/19/1955 65 y.o. Admit date: 02/15/2020 D/C date:     02/28/2020   Primary Discharge Diagnoses:  Acute Inferolateral STEMI  VT arrest Acute systolic CHF Cardiogenic Shock Acute Hypoxemic Respiratory Failure Acute CVA frontal lobes AKI R Cephalic Supervicial Venous Thrombosis Right thalamic microhemorrhage Ileus Hyperactive Delirium   Hospital Course:    65 year old male with past medical history of hypertension, hyperlipidemia. He started having chest pain on 02/15/2020 and sought help at the local urgent care facility where he collapsed. CPR was done for approximately 15 minutes was in V. fib and shocked with ROSC however lost pulse again on the ambulance ride to [facility] arrest, CPR initiated again he was intubated and ROSC was achieved but he was very hypotensive with cardiogenic shock. L ventriculogram/ECHO showed severely reduced EF. EKG showed inferolateral STEMI he was taken for emergent left heart cath where a drug-eluting stent was placed in the left circumflex and an Impella was placed for LV support.  After intervention he had normalization of EF, he had an additional VT arrest with ROSC after 3 minutes.  The following day he stabilized and his impella was removed.  He then developed left sided weakness and was found to have developed acute cardioembolic CVA small infarcts in the frontal lobes and new right thalamic microhemorrhage.  Neurology was consulted, he was continued on asa and plavix, was extubated two days later, began working with PT and had improvement in left sided weakness but continued to have some difficulty with swallowing and was given a cortrak for nutritional support.  He then developed hyperactive delirium and was started on seroquel and valproic acid by neurology and this has now improved substantially.  During his hospitalization he was  started on guideline directed medical therapy low-dose carvedilol, Entresto, spironolactone 25 mg, Jardiance 10 mg.  With continued improvement he was discharged to [facility] for further physical and occupational therapy.     1/12 admitted for Vfib arrest, cath PCI/DES and impella 1/13 Echo EF ~50-55% LV small RV ok. 1/14 VT arrest, ROSC after 3 minutes.  1/15 Impella removed 1/16 Left sided weakness. MRI with multiple small infarcts and small ICH 1/18 Extubated.  1/22 Developed hyperactive delirium at night, head CT neg  1/25 cortrak removed  Discharge Vitals: Blood pressure 112/74, pulse 81, temperature 97.7 F (36.5 C), temperature source Oral, resp. rate 20, height 5\' 10"  (1.778 m), weight 99.1 kg, SpO2 98 %.  Labs: Lab Results  Component Value Date   WBC 11.2 (H) 02/28/2020   HGB 10.6 (L) 02/28/2020   HCT 32.6 (L) 02/28/2020   MCV 88.1 02/28/2020   PLT 749 (H) 02/28/2020    Recent Labs  Lab 02/28/20 0128  NA 132*  K 4.5  CL 99  CO2 23  BUN 35*  CREATININE 0.92  CALCIUM 8.9  PROT 6.8  BILITOT 0.6  ALKPHOS 160*  ALT 72*  AST 33  GLUCOSE 204*   Lab Results  Component Value Date   CHOL 133 02/18/2020   HDL 38 (L) 02/18/2020   LDLCALC 76 02/18/2020   TRIG 97 02/18/2020   BNP (last 3 results) No results for input(s): BNP in the last 8760 hours.  ProBNP (last 3 results) No results for input(s): PROBNP in the last 8760 hours.   Diagnostic Studies/Procedures   EEG adult  Result Date: 02/26/2020 02/28/2020, MD     02/26/2020  4:42 PM History: 65 year old male being evaluated for delirium Sedation: Lorazepam given a few hours prior to study Technique: This is a 21 channel routine scalp EEG performed at the bedside with bipolar and monopolar montages arranged in accordance to the international 10/20 system of electrode placement. One channel was dedicated to EKG recording. Background: There is a well-formed posterior dominant rhythm of 9 Hz which is seen  bioccipitally and is reactive to eye opening/closure..  In addition, there is an excessive amount of bifrontally predominant beta activity.  In addition there is diffuse intrusion of delta and theta range her regular activities throughout the study.  No epileptiform discharges were seen throughout the study. Photic stimulation: Physiologic driving is not performed EEG Abnormalities: 1) intrusion into the background of diffuse irregular generalized slow activity Clinical Interpretation: This EEG is consistent with a mild generalized nonspecific cerebral dysfunction (encephalopathy). There was no seizure or seizure predisposition recorded on this study. Please note that lack of epileptiform activity on EEG does not preclude the possibility of epilepsy. Ritta Slot, MD [facility] [phone] If 7pm- 7am, please page neurology on call as listed in AMION.    Discharge Medications   Allergies as of 02/28/2020   No Known Allergies     Medication List    STOP taking these medications   lisinopril 10 MG tablet Commonly known as: ZESTRIL   omeprazole 20 MG capsule Commonly known as: PRILOSEC   simvastatin 20 MG tablet Commonly known as: ZOCOR     TAKE these medications   acetaminophen 325 MG tablet Commonly known as: TYLENOL Place 2 tablets (650 mg total) into feeding tube every 4 (four) hours as needed for mild pain (or temp > 37.5 C (99.5 F)).   aspirin 81 MG chewable tablet Chew 1 tablet (81 mg total) by mouth daily. Start taking on: February 29, 2020   carvedilol 3.125 MG tablet Commonly known as: COREG Place 1 tablet (3.125 mg total) into feeding tube 2 (two) times daily with a meal.   Chlorhexidine Gluconate Cloth 2 % Pads Apply 6 each topically daily. Start taking on: February 29, 2020   clopidogrel 75 MG tablet Commonly known as: PLAVIX Place 1 tablet (75 mg total) into feeding tube daily. Start taking on: February 29, 2020   empagliflozin 10 MG Tabs  tablet Commonly known as: JARDIANCE Take 1 tablet (10 mg total) by mouth daily.   escitalopram 20 MG tablet Commonly known as: LEXAPRO Take 20 mg by mouth daily.   feeding supplement (PROSource TF) liquid Place 45 mLs into feeding tube 4 (four) times daily.   feeding supplement (OSMOLITE 1.5 CAL) Liqd Place 1,000 mLs into feeding tube continuous.   feeding supplement Liqd Place 237 mLs into feeding tube 2 (two) times daily between meals.   fiber Pack packet Place 1 packet into feeding tube 2 (two) times daily.   insulin aspart 100 UNIT/ML injection Commonly known as: novoLOG Inject 0-15 Units into the skin 4 (four) times daily -  before meals and at bedtime.   insulin glargine 100 UNIT/ML injection Commonly known as: LANTUS Inject 0.09 mLs (9 Units total) into the skin at bedtime.   mouth rinse Liqd solution 15 mLs by Mouth Rinse route 2 (two) times daily.   nystatin 100000 UNIT/ML suspension Commonly known as: MYCOSTATIN Use as directed 5 mLs (500,000 Units total) in the mouth or throat 4 (four) times daily.   oxyCODONE 5 MG immediate release tablet Commonly known as: Oxy IR/ROXICODONE Take 1 tablet (5  mg total) by mouth once as needed for breakthrough pain.   pantoprazole sodium 40 mg/20 mL Pack Commonly known as: PROTONIX Place 20 mLs (40 mg total) into feeding tube daily. Start taking on: February 29, 2020   QUEtiapine 50 MG tablet Commonly known as: SEROQUEL Take 1 tablet (50 mg total) by mouth at bedtime.   ramelteon 8 MG tablet Commonly known as: ROZEREM Take 1 tablet (8 mg total) by mouth at bedtime.   rosuvastatin 40 MG tablet Commonly known as: CRESTOR Place 1 tablet (40 mg total) into feeding tube daily.   sacubitril-valsartan 24-26 MG Commonly known as: ENTRESTO Take 1 tablet by mouth 2 (two) times daily.   spironolactone 25 MG tablet Commonly known as: ALDACTONE Place 1 tablet (25 mg total) into feeding tube daily. Start taking on: February 29, 2020   valproic acid 250 MG/5ML solution Commonly known as: DEPAKENE Place 5 mLs (250 mg total) into feeding tube 2 (two) times daily.       Disposition   The patient will be discharged in stable condition to CIR. Discharge Instructions    Amb Referral to Cardiac Rehabilitation   Complete by: As directed    Diagnosis:  Coronary Stents STEMI     After initial evaluation and assessments completed: Virtual Based Care may be provided alone or in conjunction with Phase 2 Cardiac Rehab based on patient barriers.: Yes      Follow-up Information    [facility]. Schedule an appointment as soon as possible for a visit in 4 week(s).   Specialty: Neurology Contact information: [address] [place] [address] [phone]                Duration of Discharge Encounter: Greater than 35 minutes   Signed, Angelita Ingles, MD 02/28/2020, 10:20 AM

## 2020-02-28 NOTE — Progress Notes (Addendum)
Advanced Heart Failure Rounding Note   Subjective:    1/12 admitted for Vfib arrest, cath PCI/DES and impella 1/13 Echo EF ~50-55% LV small RV ok. 1/14 VT arrest, ROSC after 3 minutes.  1/15 Impella removed 1/16 Left sided weakness. MRI with multiple small infarcts and small ICH 1/18 Extubated.  1/22 Developed hyperactive delirium at night, head CT neg  1/25 cortrak removed  Less confused last night, alert and oriented x3 this am, appetite improving, left sided weakness improving.  Objective:   Weight Range:  Vital Signs:   Temp:  [98 F (36.7 C)-99.2 F (37.3 C)] 98.2 F (36.8 C) (01/25 0416) Pulse Rate:  [80-88] 88 (01/25 0416) Resp:  [15-19] 19 (01/25 0416) BP: (82-123)/(56-83) 123/78 (01/25 0416) SpO2:  [94 %-99 %] 98 % (01/25 0416) Last BM Date: 02/26/20  Weight change: Filed Weights   02/23/20 0500 02/24/20 0338 02/25/20 0500  Weight: 97.9 kg 98.7 kg 99.1 kg    Intake/Output:   Intake/Output Summary (Last 24 hours) at 02/28/2020 0741 Last data filed at 02/28/2020 0421 Gross per 24 hour  Intake 842 ml  Output 2925 ml  Net -2083 ml     Physical Exam: Cardiac: JVD flat, normal rate and rhythm, clear s1 and s2, no murmurs, rubs or gallops, no LE edema Pulmonary: Coarse Breath sounds bases, not in distress Abdominal: non distended abdomen, soft and nontender Psych: Alert, conversant, in good spirits Neuro: improved strength on left, oriented x3     Telemetry: SR 80's   Labs: Basic Metabolic Panel: Recent Labs  Lab 02/22/20 0456 02/22/20 1556 02/23/20 0355 02/24/20 0404 02/25/20 1046 02/26/20 0607 02/26/20 0857 02/27/20 0455 02/28/20 0128  NA 143   < > 142 139 136 136 132* 132* 132*  K 2.8*   < > 3.6 3.9 4.4 4.6 4.5 4.5 4.5  CL 108   < > 109 108 105  --  100 100 99  CO2 24   < > 23 21* 21*  --  23 23 23   GLUCOSE 195*   < > 230* 255* 202*  --  290* 274* 204*  BUN 27*   < > 25* 18 20  --  21 26* 35*  CREATININE 0.87   < > 0.81 0.67 0.70   --  0.78 0.85 0.92  CALCIUM 8.8*   < > 8.1* 7.9* 9.0  --  8.8* 8.9 8.9  MG 2.2  --  2.0 1.9  --   --   --   --   --   PHOS 2.1*  --  2.7 3.0  --   --   --   --   --    < > = values in this interval not displayed.    Liver Function Tests: Recent Labs  Lab 02/24/20 0404 02/28/20 0128  AST 133* 33  ALT 110* 72*  ALKPHOS 141* 160*  BILITOT 0.9 0.6  PROT 5.7* 6.8  ALBUMIN 2.2* 2.7*   No results for input(s): LIPASE, AMYLASE in the last 168 hours. No results for input(s): AMMONIA in the last 168 hours.  CBC: Recent Labs  Lab 02/24/20 0404 02/25/20 1046 02/26/20 0607 02/26/20 0857 02/27/20 0455 02/28/20 0128  WBC 9.3 11.7*  --  12.1* 12.1* 11.2*  HGB 9.5* 10.7* 10.9* 11.1* 10.4* 10.6*  HCT 30.0* 32.4* 32.0* 33.7* 30.8* 32.6*  MCV 89.8 88.0  --  88.0 86.5 88.1  PLT 355 523*  --  567* 640* 749*    Cardiac Enzymes:  No results for input(s): CKTOTAL, CKMB, CKMBINDEX, TROPONINI in the last 168 hours.  BNP: BNP (last 3 results) No results for input(s): BNP in the last 8760 hours.  ProBNP (last 3 results) No results for input(s): PROBNP in the last 8760 hours.    Other results:  Imaging: EEG adult  Result Date: 02/26/2020 Rejeana Brock, MD     02/26/2020  4:42 PM History: 65 year old male being evaluated for delirium Sedation: Lorazepam given a few hours prior to study Technique: This is a 21 channel routine scalp EEG performed at the bedside with bipolar and monopolar montages arranged in accordance to the international 10/20 system of electrode placement. One channel was dedicated to EKG recording. Background: There is a well-formed posterior dominant rhythm of 9 Hz which is seen bioccipitally and is reactive to eye opening/closure..  In addition, there is an excessive amount of bifrontally predominant beta activity.  In addition there is diffuse intrusion of delta and theta range her regular activities throughout the study.  No epileptiform discharges were seen  throughout the study. Photic stimulation: Physiologic driving is not performed EEG Abnormalities: 1) intrusion into the background of diffuse irregular generalized slow activity Clinical Interpretation: This EEG is consistent with a mild generalized nonspecific cerebral dysfunction (encephalopathy). There was no seizure or seizure predisposition recorded on this study. Please note that lack of epileptiform activity on EEG does not preclude the possibility of epilepsy. Ritta Slot, MD [facility] [phone] If 7pm- 7am, please page neurology on call as listed in AMION.     Medications:     Scheduled Medications: . aspirin  81 mg Oral Daily  . carvedilol  3.125 mg Per Tube BID WC  . Chlorhexidine Gluconate Cloth  6 each Topical Daily  . clopidogrel  75 mg Per Tube Daily  . feeding supplement  237 mL Per Tube BID BM  . feeding supplement (PROSource TF)  45 mL Per Tube QID  . fiber  1 packet Per Tube BID  . insulin aspart  0-15 Units Subcutaneous TID AC & HS  . insulin detemir  5 Units Subcutaneous Daily  . mouth rinse  15 mL Mouth Rinse BID  . nystatin  5 mL Mouth/Throat QID  . pantoprazole sodium  40 mg Per Tube Daily  . QUEtiapine  50 mg Oral QHS  . ramelteon  8 mg Oral QHS  . sacubitril-valsartan  1 tablet Per Tube BID  . spironolactone  25 mg Per Tube Daily  . valproic acid  250 mg Per Tube BID    Infusions: . dexmedetomidine (PRECEDEX) IV infusion Stopped (02/26/20 1008)  . feeding supplement (OSMOLITE 1.5 CAL) Stopped (02/28/20 0733)    PRN Medications: acetaminophen **OR** acetaminophen (TYLENOL) oral liquid 160 mg/5 mL **OR** acetaminophen, melatonin, oxyCODONE   Assessment/Plan:   1. Cardiac (VTarrest in setting of acute inferolateral STEMI/VT - OOH cardiac arrest on 1/12 with prolonged CPR, resuscitated w/ CPR + defibrillation   - Emergent cath 1/12 w/ totally occluded mid LCx treated w/ PCI + DES - Recurrent VT on 1/14 -> defib x 1 - Off  amiodarone  - Supp K PRN - Mag stable.  - continue low dose carvedilol  2. Acute systolic HF due in setting of acute MI -> cardiogenic shock - EF 25% in cath lab 1/12 - Echo 1/13 EF 50-55% - Impella pulled 1/14, CVP 5-7 CO-OX remains stable. - Renal function stable.  - Continue low dose entresto, spiro 25mg , low dose carvedilol, no need for diuretic  3. CAD with acute inferolateral  STEMI  - Emergent cath 1/12 w/ totally occluded mid LCx treated w/ PCI + DES - There is moderate ostial LAD disease and significant disease in the distal LAD close to the apex. No significant disease affecting the right coronary artery. - Continue DAPT. Restarted statin - Brilinta switched to Plavix; Plavix therapeutic per P2y12 lab.  - continue low dose carvedilol  4. Acute CVA with ICH - left-sided weakness --> now not following commands; repeat CT shows stable hemorrhage - MRI with multiple small infarcts and small ICH (Likely cardioembolic from arrest) - Neuro following - Brilinta switched to Plavix, PT/OT/Speech  - L-sided deficits improved and working to transfer to [employer] - On dysphagia 2 diet and now eating more can likely pull cortrak today  5. Hyperactive Delirium -began on 1/22 at night -he was started on seroquel and valproic acid, seroquel increased 1/24 -continues to improve   6. Acute hypoxic respiratory failure in setting of cardiac arrest - CXR atelectasis, CPT 8.54-->6 -> 3.7; FiO2 50% - Broadened abx d/t fever (Blood cx NGTD, sputum growing similar flora from previous respiratory cultures from 1/14)  - Extubated 02/21/20 - last dose of cefepime 1/22, last dose of vanc 1/19 procalcitonin normal 1/19  7. Ileus - Cor-trac placed 02/21/20 - resolved, no longer on reglan  8. AKI - due to ATN -> resolved   9. DM2 - HgbA1c 6.0, - increase long acting, add low dose meal time in addition to SSI - Add Jardiance prior to d/c  10. Bradycardia -Resolved.   11.  Fever - 1/16 urine,  blood and respiratory cultures sent NGTD - Has remained afebrile since finishing abx  12. R Cephalic superficial venous thrombosis - Continue to monitor - Elevate arm. Warm compresses   Length of Stay: 13   Angelita Ingles MD 02/28/2020, 7:41 AM  Advanced Heart Failure Team Pager [phone] (M-F; 7a - 4p)  Please contact [facility] for night-coverage after hours (4p -7a ) and weekends on [email or web address]   Patient seen and examined with the above-signed Advanced Practice Provider and/or Housestaff. I personally reviewed laboratory data, imaging studies and relevant notes. I independently examined the patient and formulated the important aspects of the plan. I have edited the note to reflect any of my changes or salient points. I have personally discussed the plan with the patient and/or family.  He is improved today. No CP or SOB. Volume status ok. Sugars remain high. Delirium clearing but not resolved.   General:  Well appearing. No resp difficulty HEENT: normal + cor-trak Neck: supple. no JVD. Carotids 2+ bilat; no bruits. No lymphadenopathy or thryomegaly appreciated. Cor: PMI nondisplaced. Regular rate & rhythm. No rubs, gallops or murmurs. Lungs: clear Abdomen: soft, nontender, nondistended. No hepatosplenomegaly. No bruits or masses. Good bowel sounds. Extremities: no cyanosis, clubbing, rash, edema Neuro: alert & orientedx3, cranial nerves grossly intact. moves all 4 extremities w/o difficulty. Affect pleasant  Stable from HF and CAD standpoint. Needs rehab. Hopefully CIR bed available today. Add Jardiance.   Arvilla Meres, MD  9:22 AM

## 2020-02-28 NOTE — Plan of Care (Signed)
  Problem: Respiratory: Goal: Ability to maintain a clear airway and adequate ventilation will improve Outcome: Progressing   Problem: Role Relationship: Goal: Method of communication will improve Outcome: Progressing   Problem: Cardiovascular: Goal: Ability to achieve and maintain adequate cardiovascular perfusion will improve Outcome: Progressing Goal: Vascular access site(s) Level 0-1 will be maintained Outcome: Progressing   Problem: Clinical Measurements: Goal: Ability to maintain clinical measurements within normal limits will improve Outcome: Progressing Goal: Respiratory complications will improve Outcome: Progressing   Problem: Coping: Goal: Level of anxiety will decrease Outcome: Progressing

## 2020-02-28 NOTE — Progress Notes (Signed)
Physical Therapy Treatment Patient Details Name: Jeffrey Morse MRN: [id number] DOB: 07-18-1955 Today's Date: 02/28/2020    History of Present Illness 65 y.o. male presented to ED found to be in vfib with two rounds of CPR by EMS after collapsing at urgent care ( started as chest pain) on 02/15/20. Pt was intubated and underwent emergent cath with successful stent placement to L circumflex artery and impella placement on 02/15/2020. Impella removed 02/17/2020, pt found now to have R thalamic bleed.  Intubated 1/12- 1/18.  PT eval 1/15 and was total assist +2. PT signed off 1/17. Reorder for PT on 1/19.  PMH: hypertension, sleep apnea, hypercholesterolemia.    PT Comments    Pt continuing to progress towards all goals however continues to fluctuate in/out of delirium. At beginning of session pt orientedx4 and aware of d/c plan of CIR. At end of session pt stating "I'm not going to rehab in [place]. I am going home for a few days. I hate being here in [place]." Pt re-oriented started to becoming irritated. Wife came in and helped calm patient. Pt remains very impulsive with decreased insight to safety and deficits. Pt with improved gait pattern and distance with decreased physical assist but continues to require max step by step verbal cues to sequence stepping pattern and assist for walker management. Pt to continue to be appropriate for CIR as pt demo's excellent rehab potential and was indep PTA. Acute PT to cont to follow.     Follow Up Recommendations  CIR;Supervision/Assistance - 24 hour     Equipment Recommendations  Rolling walker with 5" wheels;Wheelchair (measurements PT);Cane    Recommendations for Other Services       Precautions / Restrictions Precautions Precautions: Fall Precaution Comments: foley, NG tube Restrictions Weight Bearing Restrictions: No    Mobility  Bed Mobility Overal bed mobility: Needs Assistance Bed Mobility: Rolling;Sidelying to Sit;Sit to  Supine Rolling: Min guard;Min assist Sidelying to sit: Min assist   Sit to supine: Mod assist   General bed mobility comments: max verbal directional cues, increased time, minA for trunk elevation, modA for LE management and safety to return to supine as pt was impulsive and irritated  Transfers Overall transfer level: Needs assistance Equipment used: Rolling walker (2 wheeled) Transfers: Sit to/from Stand Sit to Stand: Mod assist         General transfer comment: max directional verbal cues for safe hand placement, modA to power up and steady during transition of hands  Ambulation/Gait Ambulation/Gait assistance: Mod assist;+2 safety/equipment Gait Distance (Feet): 35 Feet (x1, 45' x1) Assistive device: Rolling walker (2 wheeled) Gait Pattern/deviations: Step-through pattern;Decreased stride length;Decreased weight shift to right;Ataxic;Narrow base of support Gait velocity: slow Gait velocity interpretation: <1.8 ft/sec, indicate of risk for recurrent falls General Gait Details: pt with very narrow base of support requiring max and freq verbal cues to increase, pt with L bias requiring max tactile and verbal cues to stay in center of RW. pt remains ataxic however improved from yesterday   Stairs             Wheelchair Mobility    Modified Rankin (Stroke Patients Only) Modified Rankin (Stroke Patients Only) Pre-Morbid Rankin Score: No symptoms Modified Rankin: Moderately severe disability     Balance Overall balance assessment: Needs assistance Sitting-balance support: No upper extremity supported;Feet supported Sitting balance-Leahy Scale: Fair Sitting balance - Comments: pt with posterior lean when attempting to don socks requiring min/modA   Standing balance support: Bilateral upper extremity supported;During  functional activity;Single extremity supported Standing balance-Leahy Scale: Poor Standing balance comment: dependent on UE support                             Cognition Arousal/Alertness: Awake/alert Behavior During Therapy: Impulsive Overall Cognitive Status: Impaired/Different from baseline Area of Impairment: Attention;Following commands;Awareness;Safety/judgement;Problem solving                 Orientation Level: Disoriented to;Place;Time (pt was oriented to place and situation at beginning of session now about an hour later he thinks he's in [place] its 1/22 and that he will go home for a few days and then come back to rehab in [place], pt re-oriented however resistant) Current Attention Level: Selective   Following Commands: Follows one step commands with increased time;Follows multi-step commands inconsistently Safety/Judgement: Decreased awareness of safety;Decreased awareness of deficits Awareness: Emergent Problem Solving: Slow processing;Difficulty sequencing;Requires verbal cues;Requires tactile cues General Comments: pt very interactive, following commands, still uses humor to navigate conversation. Pt with decreased insight to safety and deficits. Pt became irritated at end of session after sitting in chair regarding going to rehab but wife returned and was able to calm pt down      Exercises      General Comments General comments (skin integrity, edema, etc.): VSS      Pertinent Vitals/Pain Pain Assessment: No/denies pain    Home Living                      Prior Function            PT Goals (current goals can now be found in the care plan section) Acute Rehab PT Goals PT Goal Formulation: Patient unable to participate in goal setting Time For Goal Achievement: 03/07/20 Potential to Achieve Goals: Fair Progress towards PT goals: Progressing toward goals    Frequency    Min 4X/week      PT Plan Current plan remains appropriate    Co-evaluation              AM-PAC PT "6 Clicks" Mobility   Outcome Measure  Help needed turning from your back to your side while  in a flat bed without using bedrails?: A Little Help needed moving from lying on your back to sitting on the side of a flat bed without using bedrails?: A Little Help needed moving to and from a bed to a chair (including a wheelchair)?: A Lot Help needed standing up from a chair using your arms (e.g., wheelchair or bedside chair)?: A Lot Help needed to walk in hospital room?: A Lot Help needed climbing 3-5 steps with a railing? : A Lot 6 Click Score: 14    End of Session Equipment Utilized During Treatment: Gait belt Activity Tolerance: Patient tolerated treatment well Patient left: with call bell/phone within reach;in chair;with family/visitor present;with chair alarm set Nurse Communication: Mobility status;Need for lift equipment PT Visit Diagnosis: Other abnormalities of gait and mobility (R26.89);Other symptoms and signs involving the nervous system (R29.898)     Time: 8546-2703 PT Time Calculation (min) (ACUTE ONLY): 31 min  Charges:  $Gait Training: 8-22 mins $Therapeutic Activity: 8-22 mins                     Lewis Shock, PT, DPT Acute Rehabilitation Services Pager #: [phone] Office #: [phone]    Iona Hansen 02/28/2020, 9:36 AM

## 2020-02-28 NOTE — PMR Pre-admission (Signed)
PMR Admission Coordinator Pre-Admission Assessment  Patient: Jeffrey Morse is an 65 y.o., male MRN: [id number] DOB: 14-Apr-1955 Height: 5\' 10"  (177.8 cm) Weight: 99.1 kg              Insurance Information HMO:     PPO: yes     PCP:      IPA:      80/20:      OTHER:  PRIMARY: [place] [employer] [place]      Policy#: [id number]      Subscriber: pt CM Name: Elnita Maxwell       Phone#: [phone]     Fax#: [phone] Pre-Cert#: [id number] approved until 2/7 when updates are due      Employer:  Benefits:  Phone #: [phone]     Name: 1/24 Eff. Date: 02/04/2020     Deduct: $1500      Out of Pocket Max: $5900      Life Max: none  CIR: $337 per admit then covers 100%      SNF: 70% limit 100 days per year Outpatient: $72 per visit     Co-Pay: visits per medical neccesity Home Health: 70%      Co-Pay: visits per medical neccesity DME: 70%     Co-Pay: 30% Providers: in network  SECONDARY: none      Policy#:       Phone#:   Artist:       Phone#:   The Engineer, materials Information Summary" for patients in Inpatient Rehabilitation Facilities with attached "Privacy Act Statement-Health Care Records" was provided and verbally reviewed with: N/A  Emergency Contact Information Contact Information    Name Relation Home Work [place] Spouse [phone]  [phone]   Elvia Collum   [phone]     Current Medical History  Patient Admitting Diagnosis: CVA. Encephalopathy after PEA arrest  History of Present Illness:  65 year old right-handed male with history of hypertension as well as hyperlipidemia.    Presented 02/15/2020 with chest pain and went to the urgent care where he collapsed and CPR was initiated.  He was in ventricular fibrillation and was shocked successfully.  CPR was done for approximately 15 minutes.  While in the ambulance he lost pulse again went to PEA arrest CPR resumed.  He did require intubation.  Patient was hypotensive but improved with  epinephrine.  Echocardiogram revealed severely reduced LV systolic function no evidence of pericardial effusion.  EKG after CPR showed evidence of inferior ST elevation with lateral involvement suggestive of left circumflex occlusion.  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  Recurrent Vt after cardiac cath defibrillated x1 maintained on amiodarone.  Neurology consulted 02/17/2020 for left-sided weakness.  EEG negative.  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  New right thalamic microhemorrhage as compared to scan from 01/18/2012.  MRA was unremarkable.  Currently maintained on aspirin and Plavix for CVA prophylaxis.  Hospital course ileus and cortrak placed 02/21/2020 diet slowly advanced dysphagia #2 thin liquids.  Findings of elevated hemoglobin A1c 6.0 currently maintained on Levemir.  Bouts of agitation and restlessness maintained on Seroquel as well as valproic acid.  Patient did have persistent fevers there was some question related to right upper extremity thrombus versus pneumonia it did resolve after Precedex stop so felt to be possibly drug fever.  Venous Doppler study was negative.   Complete NIHSS TOTAL: 4 Glasgow Coma Scale Score: 14  Past Medical History  Past Medical History:  Diagnosis Date  . HTN (hypertension)   . Hypercholesteremia   . Sleep apnea     Family History  family history includes Colon cancer in his mother; Stroke in his father.  Prior Rehab/Hospitalizations:  Has the patient had prior rehab or hospitalizations prior to admission? Yes  Has the patient had major surgery during 100 days prior to admission? Yes  Current Medications   Current Facility-Administered Medications:  .  acetaminophen (TYLENOL) tablet 650 mg, 650 mg, Per Tube, Q4H PRN, 650 mg at 02/21/20 1410 **OR** acetaminophen (TYLENOL) 160 MG/5ML solution 650 mg, 650 mg, Per Tube, Q4H PRN, 650 mg at 02/27/20 0952  **OR** acetaminophen (TYLENOL) suppository 650 mg, 650 mg, Rectal, Q4H PRN, Bensimhon, Bevelyn Buckles, MD, 650 mg at 02/18/20 0926 .  aspirin chewable tablet 81 mg, 81 mg, Oral, Daily, Bensimhon, Bevelyn Buckles, MD, 81 mg at 02/28/20 4008 .  carvedilol (COREG) tablet 3.125 mg, 3.125 mg, Per Tube, BID WC, Antoine Poche, MD, 3.125 mg at 02/28/20 6761 .  Chlorhexidine Gluconate Cloth 2 % PADS 6 each, 6 each, Topical, Daily, Bensimhon, Bevelyn Buckles, MD, 6 each at 02/28/20 [phone] .  clopidogrel (PLAVIX) tablet 75 mg, 75 mg, Per Tube, Daily, Antoine Poche, MD, 75 mg at 02/28/20 0828 .  dexmedetomidine (PRECEDEX) 400 MCG/100ML (4 mcg/mL) infusion, 0.2 mcg/kg/hr, Intravenous, Titrated, Liggett, Lyman Speller, NP, Stopped at 02/26/20 1008 .  empagliflozin (JARDIANCE) tablet 10 mg, 10 mg, Oral, Daily, Winfrey, William B, MD .  feeding supplement (ENSURE ENLIVE / ENSURE PLUS) liquid 237 mL, 237 mL, Per Tube, BID BM, Antoine Poche, MD, 237 mL at 02/28/20 0829 .  feeding supplement (OSMOLITE 1.5 CAL) liquid 1,000 mL, 1,000 mL, Per Tube, Continuous, Bensimhon, Bevelyn Buckles, MD, Stopped at 02/28/20 [phone] .  feeding supplement (PROSource TF) liquid 45 mL, 45 mL, Per Tube, QID, Bensimhon, Bevelyn Buckles, MD, 45 mL at 02/28/20 0828 .  fiber (NUTRISOURCE FIBER) 1 packet, 1 packet, Per Tube, BID, Bensimhon, Bevelyn Buckles, MD, 1 packet at 02/28/20 [phone] .  insulin aspart (novoLOG) injection 0-15 Units, 0-15 Units, Subcutaneous, TID AC & HS, Bensimhon, Bevelyn Buckles, MD, 5 Units at 02/28/20 [phone] .  insulin glargine (LANTUS) injection 9 Units, 9 Units, Subcutaneous, QHS, Bensimhon, Bevelyn Buckles, MD .  MEDLINE mouth rinse, 15 mL, Mouth Rinse, BID, Bensimhon, Bevelyn Buckles, MD, 15 mL at 02/28/20 0829 .  melatonin tablet 3 mg, 3 mg, Per Tube, QHS PRN, Migdalia Dk, MD, 3 mg at 02/26/20 2332 .  nystatin (MYCOSTATIN) 100000 UNIT/ML suspension 500,000 Units, 5 mL, Mouth/Throat, QID, Lorin Glass, MD, 500,000 Units at 02/28/20 [phone] .  oxyCODONE (Oxy  IR/ROXICODONE) immediate release tablet 5 mg, 5 mg, Oral, Once PRN, Clerance Lav, MD .  pantoprazole sodium (PROTONIX) 40 mg/20 mL oral suspension 40 mg, 40 mg, Per Tube, Daily, Antoine Poche, MD, 40 mg at 02/28/20 2505 .  QUEtiapine (SEROQUEL) tablet 50 mg, 50 mg, Oral, QHS, Bensimhon, Bevelyn Buckles, MD, 50 mg at 02/27/20 2259 .  ramelteon (ROZEREM) tablet 8 mg, 8 mg, Oral, QHS, Angelita Ingles, MD, 8 mg at 02/27/20 2259 .  rosuvastatin (CRESTOR) tablet 40 mg, 40 mg, Per Tube, Daily, Winfrey, Kimberlee Nearing, MD .  sacubitril-valsartan (ENTRESTO) 24-26 mg per tablet, 1 tablet, Per Tube, BID, Antoine Poche, MD, 1 tablet at 02/28/20 [phone] .  spironolactone (ALDACTONE) tablet 25 mg, 25 mg, Per Tube, Daily, Antoine Poche, MD, 25 mg at 02/28/20 7341 .  valproic acid (DEPAKENE) 250 MG/5ML solution 250 mg, 250 mg, Per Tube, BID, Micki Riley, MD, 250 mg at 02/28/20 1610  Patients Current Diet:  Diet Order            DIET DYS 2 Room service appropriate? Yes with Assist; Fluid consistency: Thin  Diet effective now                 Precautions / Restrictions Precautions Precautions: Fall Precaution Comments: foley, NG tube Restrictions Weight Bearing Restrictions: No RLE Weight Bearing: Non weight bearing Other Position/Activity Restrictions: patient with improved AROM to LLE and L shoulder   Has the patient had 2 or more falls or a fall with injury in the past year?No  Prior Activity Level Community (5-7x/wk): independent, very active; works in [employer]  Prior Functional Level Prior Function Level of Independence: Independent Comments: independent, works in Hospital doctor, working the day prior to MI  Self Care: Did the patient need help bathing, dressing, using the toilet or eating?  Independent  Indoor Mobility: Did the patient need assistance with walking from room to room (with or without device)? Independent  Stairs: Did the patient need assistance with internal or  external stairs (with or without device)? Independent  Functional Cognition: Did the patient need help planning regular tasks such as shopping or remembering to take medications? Independent  Home Assistive Devices / Equipment Home Assistive Devices/Equipment: None Home Equipment: None  Prior Device Use: Indicate devices/aids used by the patient prior to current illness, exacerbation or injury? None of the above  Current Functional Level Cognition  Overall Cognitive Status: Impaired/Different from baseline Difficult to assess due to: Level of arousal,Intubated Current Attention Level: Selective Orientation Level: Oriented to person,Oriented to place,Disoriented to time,Disoriented to situation Following Commands: Follows one step commands with increased time,Follows multi-step commands inconsistently Safety/Judgement: Decreased awareness of safety,Decreased awareness of deficits General Comments: pt very interactive, following commands, still uses humor to navigate conversation. Pt with decreased insight to safety and deficits. Pt became irritated at end of session after sitting in chair regarding going to rehab but wife returned and was able to calm pt down    Extremity Assessment (includes Sensation/Coordination)  Upper Extremity Assessment: Defer to OT evaluation LUE Deficits / Details: nurse stated L hemi paresis  Lower Extremity Assessment: Defer to PT evaluation RLE Deficits / Details: pt wiggles toes of RLE and A/AROM hip and knee LLE Deficits / Details: pt wiggles toes of RLE and A/AROM hip and knee    ADLs  Overall ADL's : Needs assistance/impaired Eating/Feeding: NPO Grooming: Oral care,Standing,Supervision/safety,Cueing for sequencing,Minimal assistance,Moderate assistance Grooming Details (indicate cue type and reason): MOD A - MIN A at times for standing balance with pt heavily leaning to L side, supervision for sequencing task of oral care. Upper Body Bathing: Maximal  assistance,Bed level Upper Body Bathing Details (indicate cue type and reason): balance support Lower Body Bathing: Total assistance,Bed level Upper Body Dressing : Minimal assistance,Sitting Upper Body Dressing Details (indicate cue type and reason): to don gown as back side cover Lower Body Dressing: Minimal assistance,Sitting/lateral leans Lower Body Dressing Details (indicate cue type and reason): to don socks from EOB Toilet Transfer: Minimal assistance,RW,Ambulation,+2 for safety/equipment Toilet Transfer Details (indicate cue type and reason): MIN A +2 for safety for simulated toilet transfer Toileting- Clothing Manipulation and Hygiene: Total assistance,Bed level Functional mobility during ADLs: Minimal assistance,+2 for safety/equipment General ADL Comments: pt continues to present with impaired balance, decreased activity tolerance, L sided weakness  and cognitive deficits    Mobility  Overal bed mobility: Needs Assistance Bed Mobility: Rolling,Sidelying to Sit,Sit to Supine Rolling: Min guard,Min assist Sidelying to sit: Min assist Supine to sit: Min assist,+2 for safety/equipment Sit to supine: Mod assist General bed mobility comments: max verbal directional cues, increased time, minA for trunk elevation, modA for LE management and safety to return to supine as pt was impulsive and irritated    Transfers  Overall transfer level: Needs assistance Equipment used: Rolling walker (2 wheeled) Transfer via Lift Equipment: Stedy Transfers: Sit to/from Stand Sit to Stand: Mod assist Squat pivot transfers: Max assist,+2 physical assistance General transfer comment: max directional verbal cues for safe hand placement, modA to power up and steady during transition of hands    Ambulation / Gait / Stairs / Wheelchair Mobility  Ambulation/Gait Ambulation/Gait assistance: Mod assist,+2 safety/equipment Gait Distance (Feet): 35 Feet (x1, 45' x1) Assistive device: Rolling walker (2  wheeled) Gait Pattern/deviations: Step-through pattern,Decreased stride length,Decreased weight shift to right,Ataxic,Narrow base of support General Gait Details: pt with very narrow base of support requiring max and freq verbal cues to increase, pt with L bias requiring max tactile and verbal cues to stay in center of RW. pt remains ataxic however improved from yesterday Gait velocity: slow Gait velocity interpretation: <1.8 ft/sec, indicate of risk for recurrent falls    Posture / Balance Dynamic Sitting Balance Sitting balance - Comments: pt with posterior lean when attempting to don socks requiring min/modA Balance Overall balance assessment: Needs assistance Sitting-balance support: No upper extremity supported,Feet supported Sitting balance-Leahy Scale: Fair Sitting balance - Comments: pt with posterior lean when attempting to don socks requiring min/modA Postural control: Posterior lean Standing balance support: Bilateral upper extremity supported,During functional activity,Single extremity supported Standing balance-Leahy Scale: Poor Standing balance comment: dependent on UE support    Special needs/care consideration Designated visitor is wife, Agustin Cree Cortrak 43 inches 10 fr left nare placed 02/21/20 #16 fr latex urethral catheter placed 1/20 Very impulsive; decreased safety awareness    Previous Home Environment  Living Arrangements: Spouse/significant other  Lives With: Spouse Available Help at Discharge: Family,Available 24 hours/day (wife to take FMLA and pt's sister and family to assist) Type of Home: Mobile home Home Layout: One level Home Access: Stairs to enter Entrance Stairs-Rails: Can reach both Entrance Stairs-Number of Steps: 3 Bathroom Shower/Tub: Tub only Firefighter: Standard Bathroom Accessibility: Yes How Accessible: Accessible via walker Home Care Services: No  Discharge Living Setting Plans for Discharge Living Setting: Patient's home,Mobile  Home,Lives with (comment) (wife) Type of Home at Discharge: Mobile home Discharge Home Layout: One level Discharge Home Access: Stairs to enter Entrance Stairs-Rails: Right,Left,Can reach both Entrance Stairs-Number of Steps: 3 Discharge Bathroom Shower/Tub: Tub only Discharge Bathroom Toilet: Standard Discharge Bathroom Accessibility: Yes How Accessible: Accessible via walker Does the patient have any problems obtaining your medications?: No  Social/Family/Support Systems 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) Contact Information: wife, Darlene Anticipated Caregiver: wife, pt's sister and other family members Anticipated Caregiver's Contact Information: cell [phone] Ability/Limitations of Caregiver: wife teaches so will take fmla Caregiver Availability: 24/7 Discharge Plan Discussed with Primary Caregiver: Yes Is Caregiver In Agreement with Plan?: Yes Does Caregiver/Family have Issues with Lodging/Transportation while Pt is in Rehab?: No  Goals Patient/Family Goal for Rehab: supervision wiht PT, OT and SLP Expected length of stay: ELOS 2 weeks Pt/Family Agrees to Admission and willing to participate: Yes Program Orientation Provided & Reviewed with Pt/Caregiver Including Roles  & Responsibilities: Yes  Decrease burden  of Care through IP rehab admission: n/a  Possible need for SNF placement upon discharge:not anticipated  Patient Condition: This patient's medical and functional status has changed since the consult dated: 02/23/2020 in which the Rehabilitation Physician determined and documented that the patient's condition is appropriate for intensive rehabilitative care in an inpatient rehabilitation facility. See "History of Present Illness" (above) for medical update. Functional changes are: overall min to mod assist. Patient's medical and functional status update has been discussed with the Rehabilitation physician and patient remains appropriate for inpatient rehabilitation. Will  admit to inpatient rehab today.  Preadmission Screen Completed By:  Clois DupesBoyette, Makeyla Govan Godwin, RN, 02/28/2020 11:35 AM ______________________________________________________________________   Discussed status with Dr. Berline ChoughLovorn on 02/28/2020 at  1145 and received approval for admission today.  Admission Coordinator:  Clois DupesBoyette, Amie Cowens Godwin, time [phone] Date 02/28/2020

## 2020-02-28 NOTE — Progress Notes (Signed)
Inpatient Rehabilitation Medication Review by a Pharmacist  A complete drug regimen review was completed for this patient to identify any potential clinically significant medication issues.  Clinically significant medication issues were identified:  yes   Type of Medication Issue Identified Description of Issue Urgent (address now) Non-Urgent (address on AM team rounds) Plan Plan Accepted by Provider? (Yes / No / Pending AM Rounds)                                Additional Drug Therapy Needed  Dc summary said to continue Osmolite Non-urgent Message Deatra Ina, PA   Other         Name of provider notified for urgent issues identified: Deatra Ina, PA  Provider Method of Notification: secure chat   For non-urgent medication issues to be resolved on team rounds tomorrow morning a [facility] Secure Chat Handoff was sent to:    Pharmacist comments:   Time spent performing this drug regimen review (minutes):  5   Hogan Hoobler 02/28/2020 5:33 PM

## 2020-02-28 NOTE — H&P (Signed)
Physical Medicine and Rehabilitation Admission H&P    No chief complaint on file. : HPI: Jeffrey Morse is a 65 year old right-handed male with history of hypertension as well as hyperlipidemia.  Per chart review lives with spouse independent prior to admission working full-time.  1 level home 3 steps to entry.  Presented 02/15/2020 with chest pain and went to the urgent care where he collapsed and CPR was initiated.  He was in ventricular fibrillation and was shocked successfully.  CPR was done for approximately 15 minutes.  While in the ambulance he lost pulse again went to PEA arrest CPR resumed.  He did require intubation.  Patient was hypotensive but improved with epinephrine.  Echocardiogram revealed severely reduced LV systolic function no evidence of pericardial effusion.  EKG after CPR showed evidence of inferior ST elevation with lateral involvement suggestive of left circumflex occlusion.  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  Recurrent Vt after cardiac cath defibrillated x1 maintained on amiodarone.  Neurology consulted 02/17/2020 for left-sided weakness.  EEG negative.  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  New right thalamic microhemorrhage as compared to scan from 01/18/2012.  MRA was unremarkable.  Currently maintained on aspirin and Plavix for CVA prophylaxis.  Hospital course ileus and cortrak placed 02/21/2020 diet slowly advanced dysphagia #2 thin liquids.  Findings of elevated hemoglobin A1c 6.0 currently maintained on Levemir.  Bouts of agitation and restlessness maintained on Seroquel as well as valproic acid.  Patient did have persistent fevers there was some question related to right upper extremity thrombus versus pneumonia it did resolve after Precedex stop so felt to be possibly drug fever.  Venous Doppler study was negative.   Therapy evaluations completed due to patient decreased  functional mobility left side weakness he was admitted for a comprehensive rehab program.   Pt really wants Cortrak out- per doctor's notes today- sounded like they were going to remove, but it wasn't taken out.   Also wants IV out of arm.  LBM today Ate 75% of lunch per pt and family.    Review of Systems  Constitutional: Positive for fever.  HENT: Negative for hearing loss.   Eyes: Negative for blurred vision and double vision.  Respiratory: Positive for shortness of breath.   Cardiovascular: Positive for chest pain. Negative for leg swelling.  Gastrointestinal: Positive for constipation. Negative for heartburn, nausea and vomiting.  Genitourinary: Negative for dysuria, flank pain and hematuria.  Musculoskeletal: Positive for myalgias.  Skin: Negative for rash.  Neurological: Positive for weakness.  All other systems reviewed and are negative.  Past Medical History:  Diagnosis Date  . HTN (hypertension)   . Hypercholesteremia   . Hypertension    Borderline hypertension  . Sleep apnea   . Stroke [facility])    Past Surgical History:  Procedure Laterality Date  . BACK SURGERY    . CARDIAC CATHETERIZATION    . CORONARY STENT INTERVENTION N/A 02/15/2020   Procedure: CORONARY STENT INTERVENTION;  Surgeon: Iran Ouch, MD;  Location: [facility] INVASIVE CV LAB;  Service: Cardiovascular;  Laterality: N/A;  CFX  . KNEE SURGERY Left 2012  . LEFT HEART CATH AND CORONARY ANGIOGRAPHY N/A 02/15/2020   Procedure: LEFT HEART CATH AND CORONARY ANGIOGRAPHY;  Surgeon: Iran Ouch, MD;  Location: [facility] INVASIVE CV LAB;  Service: Cardiovascular;  Laterality: N/A;  . RIGHT HEART CATH N/A 02/15/2020   Procedure: RIGHT HEART CATH;  Surgeon: Kirke Corin,  Chelsea Aus, MD;  Location: [facility] INVASIVE CV LAB;  Service: Cardiovascular;  Laterality: N/A;  . VENTRICULAR ASSIST DEVICE INSERTION N/A 02/15/2020   Procedure: VENTRICULAR ASSIST DEVICE INSERTION;  Surgeon: Iran Ouch, MD;  Location: [facility] INVASIVE CV LAB;   Service: Cardiovascular;  Laterality: N/A;  iMPELLA    Family History  Problem Relation Age of Onset  . Colon cancer Mother   . Stroke Father    Social History:  reports that he has quit smoking. He has never used smokeless tobacco. He reports previous alcohol use. He reports that he does not use drugs. Allergies: No Known Allergies Medications Prior to Admission  Medication Sig Dispense Refill  . acetaminophen (TYLENOL) 325 MG tablet Place 2 tablets (650 mg total) into feeding tube every 4 (four) hours as needed for mild pain (or temp > 37.5 C (99.5 F)).    Melene Muller ON 02/29/2020] aspirin 81 MG chewable tablet Chew 1 tablet (81 mg total) by mouth daily.    Marland Kitchen aspirin EC 81 MG tablet Take 81 mg by mouth daily.    Marland Kitchen atorvastatin (LIPITOR) 40 MG tablet Take 40 mg by mouth daily.    . carvedilol (COREG) 3.125 MG tablet Place 1 tablet (3.125 mg total) into feeding tube 2 (two) times daily with a meal.    . [START ON 02/29/2020] Chlorhexidine Gluconate Cloth 2 % PADS Apply 6 each topically daily.    Melene Muller ON 02/29/2020] clopidogrel (PLAVIX) 75 MG tablet Place 1 tablet (75 mg total) into feeding tube daily.    . empagliflozin (JARDIANCE) 10 MG TABS tablet Take 1 tablet (10 mg total) by mouth daily. 30 tablet   . escitalopram (LEXAPRO) 20 MG tablet Take 20 mg by mouth daily.    Marland Kitchen escitalopram (LEXAPRO) 20 MG tablet Take 20 mg by mouth daily.    . feeding supplement (ENSURE ENLIVE / ENSURE PLUS) LIQD Place 237 mLs into feeding tube 2 (two) times daily between meals. 237 mL 12  . fiber (NUTRISOURCE FIBER) PACK packet Place 1 packet into feeding tube 2 (two) times daily.    . insulin aspart (NOVOLOG) 100 UNIT/ML injection Inject 0-15 Units into the skin 4 (four) times daily -  before meals and at bedtime. 10 mL 11  . insulin glargine (LANTUS) 100 UNIT/ML injection Inject 0.09 mLs (9 Units total) into the skin at bedtime. 10 mL 11  . Mouthwashes (MOUTH RINSE) LIQD solution 15 mLs by Mouth Rinse route 2  (two) times daily.  0  . Nutritional Supplements (FEEDING SUPPLEMENT, OSMOLITE 1.5 CAL,) LIQD Place 1,000 mLs into feeding tube continuous.  0  . Nutritional Supplements (FEEDING SUPPLEMENT, PROSOURCE TF,) liquid Place 45 mLs into feeding tube 4 (four) times daily.    Marland Kitchen nystatin (MYCOSTATIN) 100000 UNIT/ML suspension Use as directed 5 mLs (500,000 Units total) in the mouth or throat 4 (four) times daily. 60 mL 0  . omeprazole (PRILOSEC) 20 MG capsule Take 20 mg by mouth daily.    Marland Kitchen oxyCODONE (OXY IR/ROXICODONE) 5 MG immediate release tablet Take 1 tablet (5 mg total) by mouth once as needed for breakthrough pain. 30 tablet 0  . [START ON 02/29/2020] pantoprazole sodium (PROTONIX) 40 mg/20 mL PACK Place 20 mLs (40 mg total) into feeding tube daily. 30 mL   . QUEtiapine (SEROQUEL) 50 MG tablet Take 1 tablet (50 mg total) by mouth at bedtime.    . ramelteon (ROZEREM) 8 MG tablet Take 1 tablet (8 mg total) by mouth at bedtime.    Marland Kitchen  rosuvastatin (CRESTOR) 40 MG tablet Place 1 tablet (40 mg total) into feeding tube daily.    . sacubitril-valsartan (ENTRESTO) 24-26 MG Take 1 tablet by mouth 2 (two) times daily. 60 tablet   . [START ON 02/29/2020] spironolactone (ALDACTONE) 25 MG tablet Place 1 tablet (25 mg total) into feeding tube daily.    Marland Kitchen valproic acid (DEPAKENE) 250 MG/5ML solution Place 5 mLs (250 mg total) into feeding tube 2 (two) times daily. 600 mL     Drug Regimen Review  Drug regimen was reviewed and remains appropriate with no significant issues identified  Home:     Functional History:    Functional Status:  Mobility:          ADL:    Cognition: Cognition Orientation Level: Oriented to person,Oriented to place    Physical Exam: Blood pressure 107/74, pulse 75, temperature 97.6 F (36.4 C), resp. rate 16, height 5\' 10"  (1.778 m), weight 94.1 kg, SpO2 98 %. Physical Exam Vitals and nursing note reviewed.  Constitutional:      Comments: Awake, alert, much calmer than  when I saw him last, Cortrak in place, very upset about Cortrak, NAD   HENT:     Head: Normocephalic and atraumatic.     Comments: Nasogastric tube in place    Right Ear: External ear normal.     Left Ear: External ear normal.     Nose: Nose normal. No congestion.     Mouth/Throat:     Mouth: Mucous membranes are dry.     Pharynx: Oropharynx is clear. No oropharyngeal exudate.  Eyes:     General:        Right eye: No discharge.        Left eye: No discharge.     Extraocular Movements: Extraocular movements intact.  Cardiovascular:     Heart sounds: Normal heart sounds.     Comments: RRR- no JVD Pulmonary:     Comments: CTA B/L- no W/R/R- good air movement Abdominal:     Comments: Soft, NT, ND, (+)BS hypoactive  Genitourinary:    Comments: Has foley- in place- penis looks OK-no erythema Urine pink tinged Musculoskeletal:     Cervical back: Normal range of motion and neck supple. No rigidity.     Comments: UEs 5-/5 in UEs and LEs- UE muscles tested Biceps, triceps, grip and finger abd B/L LE muscles HF, KE, DF and PF B/L  Skin:    Comments: L forearm IV- a little moist  Neurological:     Comments: Patient is alert.  Wife is at bedside.  Makes eye contact with examiner.  Provides his name and date of birth needed cues for place and age.  Light touch intact in all 4 extremities   Psychiatric:     Comments: Less confused; more calm     Results for orders placed or performed during the hospital encounter of 02/28/20 (from the past 48 hour(s))  Glucose, capillary     Status: None   Collection Time: 02/28/20  5:04 PM  Result Value Ref Range   Glucose-Capillary 96 70 - 99 mg/dL    Comment: Glucose reference range applies only to samples taken after fasting for at least 8 hours.   No results found.     Medical Problem List and Plan: 1.  Left hemiparesis secondary to multiple small acute infarcts within frontal lobe, right thalamic microhemorrhage after cardiac arrest with  successful PCI  -patient may  Shower- careful with cortrak  -ELOS/Goals: 2 weeks -  supervision 2.  Antithrombotics: -DVT/anticoagulation: SCDs  -antiplatelet therapy: Aspirin 81 mg daily, Plavix 75 mg daily 3. Pain Management: Oxycodone as needed 4. Mood: Valproic acid 250 mg twice daily, Rozerem 8 mg nightly, melatonin 3 mg nightly as needed  -antipsychotic agents: Seroquel 50 mg nightly 5. Neuropsych: This patient is not capable of making decisions on his own behalf. 6. Skin/Wound Care: Routine skin checks 7. Fluids/Electrolytes/Nutrition: Routine in and outs with follow-up chemistries 8.  Acute systolic congestive heart failure.  Continue low-dose Entresto 24-26 mg twice daily, Aldactone 25 mg daily and Coreg 3.125 mg twice daily 9.  Ileus.  Dysphagia-  Cortrak placed 02/21/2020 hoping to remove soon.  Diet slowly advanced. Get Calorie count x 24 hours and see if can remove Cortrak. 10.  Diabetes mellitus.  Hemoglobin A1c 6.0.  Currently on Levemir 5 units daily.  Question change to Jardiance at discharge.   Mcarthur Rossetti Anguilli- PA-C  I have personally performed a face to face diagnostic evaluation of this patient and formulated the key components of the plan.  Additionally, I have personally reviewed laboratory data, imaging studies, as well as relevant notes and concur with the physician assistant's documentation above.    Genice Rouge, MD 02/28/2020

## 2020-02-28 NOTE — Progress Notes (Signed)
Inpatient Rehabilitation Admissions Coordinator   I have insurance approval and [facility] bed to admit patient to today. I contacted Dr. Gala Romney and he is aware. I will alert acute team and TOC and make arrangements to admit today.  Ottie Glazier, RN, MSN Rehab Admissions Coordinator [phone] 02/28/2020 9:25 AM

## 2020-02-29 ENCOUNTER — Inpatient Hospital Stay (HOSPITAL_COMMUNITY): Payer: BC Managed Care – PPO | Admitting: Physical Therapy

## 2020-02-29 ENCOUNTER — Inpatient Hospital Stay (HOSPITAL_COMMUNITY): Payer: Self-pay | Admitting: Occupational Therapy

## 2020-02-29 ENCOUNTER — Inpatient Hospital Stay (HOSPITAL_COMMUNITY): Payer: Self-pay | Admitting: Speech Pathology

## 2020-02-29 LAB — COMPREHENSIVE METABOLIC PANEL
ALT: 56 U/L — ABNORMAL HIGH (ref 0–44)
AST: 24 U/L (ref 15–41)
Albumin: 2.9 g/dL — ABNORMAL LOW (ref 3.5–5.0)
Alkaline Phosphatase: 153 U/L — ABNORMAL HIGH (ref 38–126)
Anion gap: 11 (ref 5–15)
BUN: 30 mg/dL — ABNORMAL HIGH (ref 8–23)
CO2: 22 mmol/L (ref 22–32)
Calcium: 9.4 mg/dL (ref 8.9–10.3)
Chloride: 100 mmol/L (ref 98–111)
Creatinine, Ser: 1.05 mg/dL (ref 0.61–1.24)
GFR, Estimated: >60 mL/min (ref >60)
Glucose, Bld: 153 mg/dL — ABNORMAL HIGH (ref 70–99)
Potassium: 4.3 mmol/L (ref 3.5–5.1)
Sodium: 133 mmol/L — ABNORMAL LOW (ref 135–145)
Total Bilirubin: 1 mg/dL (ref 0.3–1.2)
Total Protein: 7.3 g/dL (ref 6.5–8.1)

## 2020-02-29 LAB — CBC WITH DIFFERENTIAL/PLATELET
Abs Immature Granulocytes: 0.2 10*3/uL — ABNORMAL HIGH (ref 0.00–0.07)
Basophils Absolute: 0.1 10*3/uL (ref 0.0–0.1)
Basophils Relative: 1 %
Eosinophils Absolute: 0.2 10*3/uL (ref 0.0–0.5)
Eosinophils Relative: 2 %
HCT: 33.9 % — ABNORMAL LOW (ref 39.0–52.0)
Hemoglobin: 11 g/dL — ABNORMAL LOW (ref 13.0–17.0)
Immature Granulocytes: 2 %
Lymphocytes Relative: 27 %
Lymphs Abs: 2.4 10*3/uL (ref 0.7–4.0)
MCH: 28.8 pg (ref 26.0–34.0)
MCHC: 32.4 g/dL (ref 30.0–36.0)
MCV: 88.7 fL (ref 80.0–100.0)
Monocytes Absolute: 0.9 10*3/uL (ref 0.1–1.0)
Monocytes Relative: 10 %
Neutro Abs: 5.3 10*3/uL (ref 1.7–7.7)
Neutrophils Relative %: 58 %
Platelets: 808 10*3/uL — ABNORMAL HIGH (ref 150–400)
RBC: 3.82 MIL/uL — ABNORMAL LOW (ref 4.22–5.81)
RDW: 14.8 % (ref 11.5–15.5)
WBC: 9.1 10*3/uL (ref 4.0–10.5)
nRBC: 0 % (ref 0.0–0.2)

## 2020-02-29 LAB — GLUCOSE, CAPILLARY
Glucose-Capillary: 119 mg/dL — ABNORMAL HIGH (ref 70–99)
Glucose-Capillary: 123 mg/dL — ABNORMAL HIGH (ref 70–99)
Glucose-Capillary: 138 mg/dL — ABNORMAL HIGH (ref 70–99)
Glucose-Capillary: 177 mg/dL — ABNORMAL HIGH (ref 70–99)
Glucose-Capillary: 183 mg/dL — ABNORMAL HIGH (ref 70–99)

## 2020-02-29 MED ORDER — ADULT MULTIVITAMIN W/MINERALS CH
1.0000 | ORAL_TABLET | Freq: Every day | ORAL | Status: DC
  Administered 2020-03-01 – 2020-03-09 (×9): 1 via ORAL
  Filled 2020-02-29 (×9): qty 1

## 2020-02-29 MED ORDER — LIVING WELL WITH DIABETES BOOK
Freq: Once | Status: AC
  Filled 2020-02-29: qty 1

## 2020-02-29 MED ORDER — ENSURE ENLIVE PO LIQD
237.0000 mL | Freq: Two times a day (BID) | ORAL | Status: DC
  Administered 2020-02-29 – 2020-03-08 (×14): 237 mL via ORAL

## 2020-02-29 MED ORDER — BLOOD PRESSURE CONTROL BOOK
Freq: Once | Status: AC
  Filled 2020-02-29: qty 1

## 2020-02-29 MED ORDER — ESCITALOPRAM OXALATE 10 MG PO TABS
20.0000 mg | ORAL_TABLET | Freq: Every day | ORAL | Status: DC
  Administered 2020-02-29 – 2020-03-09 (×10): 20 mg via ORAL
  Filled 2020-02-29 (×10): qty 2

## 2020-02-29 NOTE — Progress Notes (Signed)
Calorie Count Note  24-hour calorie count ordered. Calorie count started yesterday at dinner meal and ended today after lunch meal. Please see results below. Please see full Initial Nutrition Assessment note from today for additional details.  Diet: dysphagia 3 with thin liquids as of 0923 today (previously dysphagia 2 with thin liquids) Supplements: none currently ordered  1/25 Dinner: 160 kcal, 9 grams of protein 1/26 Breakfast: 595 kcal, 18 grams of protein 1/26 Lunch: 97 kcal, 7 grams of protein  Total 24-hour intake: 852 kcal (37% of minimum estimated needs)  34 grams of protein (28% of minimum estimated needs)  Nutrition Diagnosis:  Increased nutrient needs related to post-op healing,other (therapies) as evidenced by estimated needs.  Goal: Patient will meet greater than or equal to 90% of their needs  Intervention: - d/c calorie count - Ensure Enlive po BID, each supplement provides 350 kcal and 20 grams of protein - Magic Cup BID with meals, each supplement provides 290 kcal and 9 grams of protein - MVI with minerals daily   Mertie Clause, MS, RD, LDN Inpatient Clinical Dietitian Please see AMiON for contact information.

## 2020-02-29 NOTE — Patient Care Conference (Signed)
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 Claudette Laws Care Coodinator Present: Chana Bode, RN, BSN, CRRN;Christina Vita Barley, BSW Nurse Present: Other (comment) Freddrick March, RN) PT Present: Casimiro Needle, PT OT Present: Perrin Maltese, OT SLP Present: Suzzette Righter, CF-SLP PPS Coordinator present : Fae Pippin, SLP     Current Status/Progress Goal Weekly Team Focus  Bowel/Bladder             Swallow/Nutrition/ Hydration   eval pending         ADL's             Mobility   eval pending         Communication   eval pending         Safety/Cognition/ Behavioral Observations  eval pending         Pain             Skin               Discharge Planning:  Assesment pending   Team Discussion: Left hemiparesis after right thalamic micro-hemorrhages after cardiac arrest and multiple CPR; questionable anoxia with encephalopathy and small infarcts from poor flow. Little focal neurological deficits. Cortrak out and appetite is good. Encourage po fluids Patient on target to meet rehab goals: yes  *See Care Plan and progress notes for long and short-term goals.   Revisions to Treatment Plan:   Focus on higher level cognitive deficits, memory, poor safety awareness and monitor for GI  issues Teaching Needs: Transfers, toileting, safety, medications, secondary risk management, etc.   Current Barriers to Discharge: Decreased caregiver support Behavior; poor frustration tolerance  Possible Resolutions to Barriers: Family education     Medical Summary Current Status: po intake improving, Cortrak removed, cont Bowel and bladder  Barriers to Discharge: Nutrition means;Medical stability   Possible Resolutions to [employer] Focus: initiate rehab program and evals, increase safety awareness   Continued Need for Acute Rehabilitation Level of Care: The patient requires daily medical management by a physician with specialized training in physical medicine and rehabilitation for the following reasons: Direction of a multidisciplinary physical rehabilitation program to maximize functional independence : Yes Medical management of patient stability for increased activity during participation in an intensive rehabilitation regime.: Yes Analysis of laboratory values and/or radiology reports with any subsequent need for medication adjustment and/or medical intervention. : Yes   I attest that I was present, lead the team conference, and concur with the assessment and plan of the team.   Chana Bode B 02/29/2020, 4:20 PM

## 2020-02-29 NOTE — Progress Notes (Signed)
Patient ID: Jeffrey Morse, male   DOB: 12/06/1955, 65 y.o.   MRN: [id number] Met with patient and wife to review role of the nurse CM and collaboration with the SW to facilitate preparation for discharge. Reviewed risk factors for secondary stroke including CAD, HTN, HLD; LDL 76, T2DM with A1c of 6.0. On DAPT ASA + Plavix. Patient given handouts and education initiated on risk management including DASH diet, CMM diet/carb counting, Insulin management, etc. Continue to follow along to discharge and address educational needs. Margarito Liner

## 2020-02-29 NOTE — Progress Notes (Signed)
Courtney Heys, MD  Physician  Physical Medicine and Rehabilitation  Consult Note      Signed  Date of Service:  02/23/2020 10:52 AM      Related encounter: ED to Hosp-Admission (Discharged) from 02/15/2020 in [facility]       Signed      Expand All Collapse All     Show:Clear all [x] Manual[x] Template[] Copied  Added by: [x] Angiulli, Lavon Paganini, PA-C[x] Lovorn, Jinny Blossom, MD   [] Hover for details           Physical Medicine and Rehabilitation Consult Reason for Consult: Left-sided weakness Referring Physician: Dr. Haroldine Laws     HPI: Jeffrey Morse is a 65 y.o. right-handed male with history of hypertension as well as hyperlipidemia.  Per chart review patient lives with spouse independent prior to admission working full-time.  1 level home 3 steps to entry.  Presented 02/15/2020 with chest pain and went to the urgent care where he collapsed and CPR was initiated.  He was in ventricular fibrillation and was shocked successfully.  CPR was done for approximately 15 minutes.  While in the ambulance he lost pulse again went into PEA CPR resumed.  He did require intubation.  The patient was hypotensive but improved with epinephrine.  Echocardiogram revealed severely reduced LV systolic function no evidence of pericardial effusion.  EKG after CPR showed evidence of inferior ST elevation with lateral involvement suggestive of left circumflex occlusion.  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  Recurrent VT after cardiac cath defib x1 maintained on amiodarone.  Neurology consulted 02/17/2020 for left-sided weakness.  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  New right thalamic microhemorrhage as compared to scan from 01/18/2012.  MRA was unremarkable.  Currently maintained on aspirin and Plavix for CVA prophylaxis.  Hospital course ileus his diet has been slowly advanced.  Patient did have  persistent fevers there was some question related to a right upper extremity thrombus versus pneumonia it did resolve after Precedex stopped so felt to be possibly drug fever.  Lower extremity Dopplers were negative.  Therapy evaluations completed with recommendations of physical medicine rehab consult.   Pt has moderate encephalopathy- asking a lot of tangential questions and almost giddy in behavior.  Son at bedside.    Review of Systems  Unable to perform ROS: Acuity of condition  Constitutional: Negative for chills and fever.  HENT: Negative for hearing loss.   Eyes: Negative for blurred vision and double vision.  Respiratory: Positive for shortness of breath.   Cardiovascular: Positive for chest pain.  Gastrointestinal: Positive for constipation. Negative for heartburn, nausea and vomiting.  Genitourinary: Negative for dysuria, flank pain and hematuria.  Musculoskeletal: Positive for myalgias.  Skin: Negative for rash.  All other systems reviewed and are negative.       Past Medical History:  Diagnosis Date  . HTN (hypertension)    . Hypercholesteremia    . Sleep apnea      Past Surgical History:  Procedure Laterality Date  . BACK SURGERY      . CORONARY STENT INTERVENTION N/A 02/15/2020    Procedure: CORONARY STENT INTERVENTION;  Surgeon: Wellington Hampshire, MD;  Location: [facility];  Service: Cardiovascular;  Laterality: N/A;  CFX  . KNEE SURGERY Left 2012  . LEFT HEART CATH AND CORONARY ANGIOGRAPHY N/A 02/15/2020    Procedure: LEFT HEART CATH AND CORONARY ANGIOGRAPHY;  Surgeon: Wellington Hampshire, MD;  Location: [facility];  Service: Cardiovascular;  Laterality: N/A;  . RIGHT HEART CATH N/A 02/15/2020    Procedure: RIGHT HEART CATH;  Surgeon: Wellington Hampshire, MD;  Location: [facility];  Service: Cardiovascular;  Laterality: N/A;  . VENTRICULAR ASSIST DEVICE INSERTION N/A 02/15/2020    Procedure: VENTRICULAR ASSIST DEVICE INSERTION;  Surgeon: Wellington Hampshire, MD;  Location: [facility];  Service: Cardiovascular;  Laterality: N/A;  iMPELLA          Family History  Problem Relation Age of Onset  . Colon cancer Mother    . Stroke Father      Social History:  reports that he has quit smoking. He has never used smokeless tobacco. He reports previous alcohol use. He reports that he does not use drugs. Allergies: No Known Allergies       Medications Prior to Admission  Medication Sig Dispense Refill  . escitalopram (LEXAPRO) 20 MG tablet Take 20 mg by mouth daily.      Marland Kitchen lisinopril (ZESTRIL) 10 MG tablet Take 10 mg by mouth daily.      Marland Kitchen omeprazole (PRILOSEC) 20 MG capsule Take 20 mg by mouth daily.      . simvastatin (ZOCOR) 20 MG tablet Take 20 mg by mouth daily.          Home: Home Living Family/patient expects to be discharged to:: Private residence Living Arrangements: Spouse/significant other Available Help at Discharge: Family Type of Home: Mobile home Home Access: Stairs to enter Technical brewer of Steps: 3 Entrance Stairs-Rails: Can reach both Home Layout: One level Bathroom Shower/Tub: Tub only Biochemist, clinical: Standard Home Equipment: None  Functional History: Prior Function Level of Independence: Independent Comments: independent, works in [employer], working the day prior to MI Functional Status:  Mobility: Bed Mobility Overal bed mobility: Needs Assistance Bed Mobility: Supine to Sit Supine to sit: Max assist,+2 for physical assistance,HOB elevated Sit to supine: Total assist,+2 for physical assistance,HOB elevated General bed mobility comments: Needed assist for LES and elevation of trunk with use of pad to scoot pt to EOB.  Pt flexed posture and head flexed as well with rounded shoulders in sitting. Transfers Overall transfer level: Needs assistance Equipment used: 2 person hand held assist Transfers: Squat Pivot Transfers Squat pivot transfers: Max assist,+2 physical assistance General  transfer comment: Pt needed max of 2 persons to squat and pivot to chair with use of pad.   ADL: ADL Overall ADL's : Needs assistance/impaired Eating/Feeding: NPO Grooming: Wash/dry hands,Wash/dry face,Maximal assistance,Sitting Grooming Details (indicate cue type and reason): +2 to maintain upright sitting edge of bed Upper Body Bathing: Maximal assistance,Sitting Upper Body Bathing Details (indicate cue type and reason): balance support Lower Body Bathing: Total assistance,Bed level Upper Body Dressing : Bed level,Total assistance Lower Body Dressing: Total assistance,Bed level Toileting- Clothing Manipulation and Hygiene: Bed level,Total assistance Functional mobility during ADLs: +2 for physical assistance,Maximal assistance General ADL Comments: squat pivot to recliner   Cognition: Cognition Overall Cognitive Status: Impaired/Different from baseline Orientation Level: Oriented to person,Oriented to place,Oriented to time,Disoriented to situation Cognition Arousal/Alertness: Awake/alert Behavior During Therapy: Restless,Impulsive Overall Cognitive Status: Impaired/Different from baseline Area of Impairment: Following commands,Awareness,Safety/judgement,Orientation Orientation Level: Person,Place Following Commands: Follows one step commands with increased time Safety/Judgement: Decreased awareness of safety,Decreased awareness of deficits Awareness: Emergent General Comments: Pt more alert and interactive today. Difficult to assess due to: Level of arousal,Intubated   Blood pressure 136/84, pulse 87, temperature 98.7 F (37.1 C), temperature  source Oral, resp. rate (!) 44, height $RemoveBe'5\' 10"'cyvCroMqR$  (1.778 m), weight 97.9 kg, SpO2 98 %. Physical Exam Vitals and nursing note reviewed. Exam conducted with a chaperone present.  Constitutional:      Comments: Pt older male laying in bed- giddy in behaviors, tangential in speech and somewhat confused about why here, son at bedside, NAD   HENT:     Head: Normocephalic and atraumatic.     Comments: Tongue midline- equivocal smile    Right Ear: External ear normal.     Left Ear: External ear normal.     Nose: Nose normal.     Comments: cortrak in place    Mouth/Throat:     Mouth: Mucous membranes are dry.     Pharynx: No oropharyngeal exudate.  Eyes:     General:        Right eye: No discharge.        Left eye: No discharge.     Extraocular Movements: Extraocular movements intact.     Comments: No nystagmus  Cardiovascular:     Rate and Rhythm: Normal rate and regular rhythm.     Pulses: Normal pulses.     Heart sounds: Normal heart sounds. No murmur heard. No gallop.   Pulmonary:     Comments: CTA B/L- no W/R/R- good air movement  Abdominal:     Comments: Soft, NT, ND, (+)BS - has rectal tube in place- draining liquid stool  Genitourinary:    Comments: Penis has foley in it- has some bruising noted on shaft/head from R groin bruising- purple/yellow bruising in R groin and shaft of penis Musculoskeletal:     Cervical back: Normal range of motion. No rigidity.     Comments: RYE- 5/5- in biceps, triceps, grip and finger abd LUE- biceps 4-/5, triceps 3+/5, grip 4-/5, and fnger abd 3/5 RLE_ 5/5 in HF, KE, DF and PF LLE- HF 2+/5, KE 3+/5, DF and PF 3+/5  Prevalon boot on LLE  Skin:    General: Skin is warm and dry.     Comments: Purple bruising on R groin and shaft of penis- no open areas RUE PICC- looks OK  Neurological:     Comments: Patient was lethargic.  Difficult to follow commands.  Overall examination was limited.  Encephalopathic, tangential, couldn't keep on topic, giddy in nature- laughing and playful, but confused- Ox1-2 Intact to light touch in all 4 extremities per pt   Psychiatric:     Comments: Confused, giddy/playful        Lab Results Last 24 Hours       Results for orders placed or performed during the hospital encounter of 02/15/20 (from the past 24 hour(s))  Glucose, capillary      Status: Abnormal    Collection Time: 02/22/20 11:37 AM  Result Value Ref Range    Glucose-Capillary 219 (H) 70 - 99 mg/dL  Glucose, capillary     Status: Abnormal    Collection Time: 02/22/20  3:24 PM  Result Value Ref Range    Glucose-Capillary 181 (H) 70 - 99 mg/dL  POCT I-Stat EG7     Status: Abnormal    Collection Time: 02/22/20  3:56 PM  Result Value Ref Range    pH, Ven 7.445 (H) 7.250 - 7.430    pCO2, Ven 35.0 (L) 44.0 - 60.0 mmHg    pO2, Ven 25.0 (LL) 32.0 - 45.0 mmHg    Bicarbonate 24.0 20.0 - 28.0 mmol/L    TCO2 25 22 - 32  mmol/L    O2 Saturation 49.0 %    Acid-Base Excess 0.0 0.0 - 2.0 mmol/L    Sodium 150 (H) 135 - 145 mmol/L    Potassium 3.3 (L) 3.5 - 5.1 mmol/L    Calcium, Ion 1.20 1.15 - 1.40 mmol/L    HCT 29.0 (L) 39.0 - 52.0 %    Hemoglobin 9.9 (L) 13.0 - 17.0 g/dL    Patient temperature 99.1 F      Sample type VENOUS      Comment NOTIFIED PHYSICIAN    Basic metabolic panel     Status: Abnormal    Collection Time: 02/22/20  3:57 PM  Result Value Ref Range    Sodium 146 (H) 135 - 145 mmol/L    Potassium 3.3 (L) 3.5 - 5.1 mmol/L    Chloride 112 (H) 98 - 111 mmol/L    CO2 23 22 - 32 mmol/L    Glucose, Bld 196 (H) 70 - 99 mg/dL    BUN 27 (H) 8 - 23 mg/dL    Creatinine, Ser 0.83 0.61 - 1.24 mg/dL    Calcium 8.5 (L) 8.9 - 10.3 mg/dL    GFR, Estimated >60 >60 mL/min    Anion gap 11 5 - 15  Glucose, capillary     Status: Abnormal    Collection Time: 02/22/20  8:42 PM  Result Value Ref Range    Glucose-Capillary 220 (H) 70 - 99 mg/dL  Glucose, capillary     Status: Abnormal    Collection Time: 02/23/20 12:54 AM  Result Value Ref Range    Glucose-Capillary 242 (H) 70 - 99 mg/dL  Glucose, capillary     Status: Abnormal    Collection Time: 02/23/20  3:24 AM  Result Value Ref Range    Glucose-Capillary 230 (H) 70 - 99 mg/dL  Cooxemetry Panel (carboxy, met, total hgb, O2 sat)     Status: Abnormal    Collection Time: 02/23/20  3:55 AM  Result Value Ref Range     Total hemoglobin 9.5 (L) 12.0 - 16.0 g/dL    O2 Saturation 71.7 %    Carboxyhemoglobin 1.4 0.5 - 1.5 %    Methemoglobin 0.9 0.0 - 1.5 %  Magnesium     Status: None    Collection Time: 02/23/20  3:55 AM  Result Value Ref Range    Magnesium 2.0 1.7 - 2.4 mg/dL  Phosphorus     Status: None    Collection Time: 02/23/20  3:55 AM  Result Value Ref Range    Phosphorus 2.7 2.5 - 4.6 mg/dL  Basic metabolic panel     Status: Abnormal    Collection Time: 02/23/20  3:55 AM  Result Value Ref Range    Sodium 142 135 - 145 mmol/L    Potassium 3.6 3.5 - 5.1 mmol/L    Chloride 109 98 - 111 mmol/L    CO2 23 22 - 32 mmol/L    Glucose, Bld 230 (H) 70 - 99 mg/dL    BUN 25 (H) 8 - 23 mg/dL    Creatinine, Ser 0.81 0.61 - 1.24 mg/dL    Calcium 8.1 (L) 8.9 - 10.3 mg/dL    GFR, Estimated >60 >60 mL/min    Anion gap 10 5 - 15  CBC     Status: Abnormal    Collection Time: 02/23/20  3:55 AM  Result Value Ref Range    WBC 9.8 4.0 - 10.5 K/uL    RBC 2.93 (L) 4.22 - 5.81 MIL/uL  Hemoglobin 8.7 (L) 13.0 - 17.0 g/dL    HCT 26.0 (L) 39.0 - 52.0 %    MCV 88.7 80.0 - 100.0 fL    MCH 29.7 26.0 - 34.0 pg    MCHC 33.5 30.0 - 36.0 g/dL    RDW 14.9 11.5 - 15.5 %    Platelets 284 150 - 400 K/uL    nRBC 0.0 0.0 - 0.2 %  Glucose, capillary     Status: Abnormal    Collection Time: 02/23/20  6:52 AM  Result Value Ref Range    Glucose-Capillary 207 (H) 70 - 99 mg/dL       Imaging Results (Last 48 hours)  DG Abd Portable 1V   Result Date: 02/21/2020 CLINICAL DATA:  Feeding tube placement. EXAM: PORTABLE ABDOMEN - 1 VIEW COMPARISON:  02/21/2020. FINDINGS: Feeding tube tip noted over the distal portion of the duodenum at the level of the ligament of Treitz. Contrast noted in the duodenum and proximal jejunum. IMPRESSION: Feeding tube tip noted over the distal duodenum at the level of the ligament of Treitz. Electronically Signed   By: Marcello Moores  Register   On: 02/21/2020 12:54    VAS [place] LOWER EXTREMITY VENOUS (DVT)    Result Date: 02/21/2020  Lower Venous DVT Study Indications: Edema.  Comparison Study: no prior Performing Technologist: Abram Sander RVS  Examination Guidelines: A complete evaluation includes B-mode imaging, spectral Doppler, color Doppler, and power Doppler as needed of all accessible portions of each vessel. Bilateral testing is considered an integral part of a complete examination. Limited examinations for reoccurring indications may be performed as noted. The reflux portion of the exam is performed with the patient in reverse Trendelenburg.  +---------+---------------+---------+-----------+----------+--------------+ RIGHT    CompressibilityPhasicitySpontaneityPropertiesThrombus Aging +---------+---------------+---------+-----------+----------+--------------+ CFV      Full           Yes      Yes                                 +---------+---------------+---------+-----------+----------+--------------+ SFJ      Full                                                        +---------+---------------+---------+-----------+----------+--------------+ FV Prox  Full                                                        +---------+---------------+---------+-----------+----------+--------------+ FV Mid   Full                                                        +---------+---------------+---------+-----------+----------+--------------+ FV DistalFull                                                        +---------+---------------+---------+-----------+----------+--------------+ PFV  Full                                                        +---------+---------------+---------+-----------+----------+--------------+ POP      Full           Yes      Yes                                 +---------+---------------+---------+-----------+----------+--------------+ PTV      Full                                                         +---------+---------------+---------+-----------+----------+--------------+ PERO     Full                                                        +---------+---------------+---------+-----------+----------+--------------+   +---------+---------------+---------+-----------+----------+--------------+ LEFT     CompressibilityPhasicitySpontaneityPropertiesThrombus Aging +---------+---------------+---------+-----------+----------+--------------+ CFV      Full           Yes      Yes                                 +---------+---------------+---------+-----------+----------+--------------+ SFJ      Full                                                        +---------+---------------+---------+-----------+----------+--------------+ FV Prox  Full                                                        +---------+---------------+---------+-----------+----------+--------------+ FV Mid   Full                                                        +---------+---------------+---------+-----------+----------+--------------+ FV DistalFull                                                        +---------+---------------+---------+-----------+----------+--------------+ PFV      Full                                                        +---------+---------------+---------+-----------+----------+--------------+  POP      Full           Yes      Yes                                 +---------+---------------+---------+-----------+----------+--------------+ PTV      Full                                                        +---------+---------------+---------+-----------+----------+--------------+ PERO     Full                                                        +---------+---------------+---------+-----------+----------+--------------+     Summary: BILATERAL: - No evidence of deep vein thrombosis seen in the lower extremities, bilaterally. - No evidence of  superficial venous thrombosis in the lower extremities, bilaterally. -No evidence of popliteal cyst, bilaterally.   *See table(s) above for measurements and observations. Electronically signed by Deitra Mayo MD on 02/21/2020 at 4:07:53 PM.    Final          Assessment/Plan: 1. 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 S/P PCI/and stent 2. Does the need for close, 24 hr/day medical supervision in concert with the patient's rehab needs make it unreasonable for this patient to be served in a less intensive setting? Yes 3. Co-Morbidities requiring supervision/potential complications: as above, HTN, HLD, GERD, esophageal stretching 12/21 4. Due to bladder management, bowel management, safety, skin/wound care, disease management, medication administration, pain management and patient education, does the patient require 24 hr/day rehab nursing? Yes 5. Does the patient require coordinated care of a physician, rehab nurse, therapy disciplines of PT and OT and SLP to address physical and functional deficits in the context of the above medical diagnosis(es)? Yes Addressing deficits in the following areas: balance, endurance, locomotion, strength, transferring, bowel/bladder control, bathing, dressing, feeding, grooming, toileting, cognition, speech, swallowing and psychosocial support 6. Can the patient actively participate in an intensive therapy program of at least 3 hrs of therapy per day at least 5 days per week? Yes 7. The potential for patient to make measurable gains while on inpatient rehab is good and fair 8. Anticipated functional outcomes upon discharge from inpatient rehab are supervision and min assist  with PT, supervision and min assist with OT, supervision and min assist with SLP. 9. Estimated rehab length of stay to reach the above  functional goals is: ` 3+ weeks 10. Anticipated discharge destination: Home 11. Overall Rehab/Functional Prognosis: good and fair   RECOMMENDATIONS: This patient's condition is appropriate for continued rehabilitative care in the following setting: CIR Patient has agreed to participate in recommended program. Potentially Note that insurance prior authorization may be required for reimbursement for recommended care.   Comment:  1. Pt is a great candidate for inpt rehab, however needs to be more medically ready/i.e off monitor, on stable ABX regimen 2. Also has Dysphagia- has Cortrak but on D2 thin diet- 3. Will submit  for inpt CIR- will d/w admissions coordinators 4. Thank you for this consult     Cathlyn Parsons, PA-C 02/23/2020      I have personally performed a face to face diagnostic evaluation of this patient and formulated the key components of the plan.  Additionally, I have personally reviewed laboratory data, imaging studies, as well as relevant notes and concur with the physician assistant's documentation above.                Revision History                        Routing History              Note Details  Jan Fireman, MD File Time 02/23/2020  8:33 PM  Author Type Physician Status Signed  Last Editor Courtney Heys, MD Service Physical Medicine and [place] # [phone] Admit Date 02/28/2020

## 2020-02-29 NOTE — Progress Notes (Signed)
Patient ID: Jeffrey Morse, male   DOB: 03-19-1955, 65 y.o.   MRN: [id number] Team Conference Report to Patient/Family  Team Conference discussion was reviewed with the patient and caregiver, including goals, any changes in plan of care and target discharge date.  Patient and caregiver express understanding and are in agreement.  The patient has a target discharge date of  (ELOS week, initial evals pending).  Andria Rhein 02/29/2020, 1:14 PM

## 2020-02-29 NOTE — Evaluation (Signed)
Physical Therapy Assessment and Plan  Patient Details  Name: Jeffrey Morse MRN: [id number] Date of Birth: [birth year]  PT Diagnosis: Abnormality of gait, Ataxia, Cognitive deficits, Coordination disorder, Difficulty walking, Hemiparesis non-dominant, Impaired cognition, Muscle weakness and Pain in R knee Rehab Potential: Good ELOS: ~ 2 weeks   Today's Date: 02/29/2020 PT Individual Time: 1420-1540 PT Individual Time Calculation (min): 80 min    Hospital Problem: Principal Problem:   Ischemic cerebrovascular accident (CVA) of frontal lobe ([place])   Past Medical History:  Past Medical History:  Diagnosis Date  . HTN (hypertension)   . Hypercholesteremia   . Hypertension    Borderline hypertension  . Sleep apnea   . Stroke [facility])    Past Surgical History:  Past Surgical History:  Procedure Laterality Date  . BACK SURGERY    . CARDIAC CATHETERIZATION    . CORONARY STENT INTERVENTION N/A 02/15/2020   Procedure: CORONARY STENT INTERVENTION;  Surgeon: Wellington Hampshire, MD;  Location: [place] [facility];  Service: Cardiovascular;  Laterality: N/A;  CFX  . KNEE SURGERY Left 2012  . LEFT HEART CATH AND CORONARY ANGIOGRAPHY N/A 02/15/2020   Procedure: LEFT HEART CATH AND CORONARY ANGIOGRAPHY;  Surgeon: Wellington Hampshire, MD;  Location: [place] [facility];  Service: Cardiovascular;  Laterality: N/A;  . RIGHT HEART CATH N/A 02/15/2020   Procedure: RIGHT HEART CATH;  Surgeon: Wellington Hampshire, MD;  Location: [place] [facility];  Service: Cardiovascular;  Laterality: N/A;  . VENTRICULAR ASSIST DEVICE INSERTION N/A 02/15/2020   Procedure: VENTRICULAR ASSIST DEVICE INSERTION;  Surgeon: Wellington Hampshire, MD;  Location: [place] [facility];  Service: Cardiovascular;  Laterality: N/A;  iMPELLA     Assessment & Plan Clinical Impression: Patient is a 65 y.o. right-handed male with history of hypertension as well as hyperlipidemia.  Per chart review lives with spouse independent prior to admission  working full-time.  1 level home 3 steps to entry.  Presented 02/15/2020 with chest pain and went to the urgent care where he collapsed and CPR was initiated.  He was in ventricular fibrillation and was shocked successfully.  CPR was done for approximately 15 minutes.  While in the ambulance he lost pulse again went to PEA arrest CPR resumed.  He did require intubation.  Patient was hypotensive but improved with epinephrine.  Echocardiogram revealed severely reduced LV systolic function no evidence of pericardial effusion.  EKG after CPR showed evidence of inferior ST elevation with lateral involvement suggestive of left circumflex occlusion.  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  Recurrent Vt after cardiac cath defibrillated x1 maintained on amiodarone.  Neurology consulted 02/17/2020 for left-sided weakness.  EEG negative.  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  New right thalamic microhemorrhage as compared to scan from 01/18/2012.  MRA was unremarkable.  Currently maintained on aspirin and Plavix for CVA prophylaxis.  Hospital course ileus and cortrak placed 02/21/2020 diet slowly advanced dysphagia #2 thin liquids.  Findings of elevated hemoglobin A1c 6.0 currently maintained on Levemir.  Bouts of agitation and restlessness maintained on Seroquel as well as valproic acid.  Patient did have persistent fevers there was some question related to right upper extremity thrombus versus pneumonia it did resolve after Precedex stop so felt to be possibly drug fever.  Venous Doppler study was negative.   Therapy evaluations completed due to patient decreased functional mobility left side weakness he was admitted for a comprehensive  rehab program.  Patient transferred to CIR on 02/28/2020 .   Patient currently requires min/mod assist with mobility secondary to muscle weakness, decreased cardiorespiratoy endurance, impaired timing and  sequencing, unbalanced muscle activation and decreased coordination,  , decreased attention, decreased awareness, decreased problem solving, decreased safety awareness, decreased memory and delayed processing and decreased sitting balance, decreased standing balance, decreased postural control and decreased balance strategies.  Prior to hospitalization, patient was independent  with mobility and lived with Spouse in a House home.  Home access is 3STE onto back porch without handrails and 8 STE front with B HRs (wide) and 1 small step-up/threshold into home.  Patient will benefit from skilled PT intervention to maximize safe functional mobility, minimize fall risk and decrease caregiver burden for planned discharge home with 24 hour supervision.  Anticipate patient will benefit from follow up OP at discharge.  PT - End of Session Activity Tolerance: Tolerates 30+ min activity with multiple rests Endurance Deficit: Yes Endurance Deficit Description: reports of fatigue throughout session with pt requiring frequent seated rest breaks PT Assessment Rehab Potential (ACUTE/IP ONLY): Good PT Barriers to Discharge: Home environment access/layout PT Patient demonstrates impairments in the following area(s): Balance;Motor;Safety;Behavior;Sensory;Pain;Endurance;Perception PT Transfers Functional Problem(s): Bed Mobility;Furniture;Bed to Chair;Floor;Car PT Locomotion Functional Problem(s): Ambulation;Stairs PT Plan PT Intensity: Minimum of 1-2 x/day ,45 to 90 minutes PT Frequency: 5 out of 7 days PT Duration Estimated Length of Stay: ~ 2 weeks PT Treatment/Interventions: Ambulation/gait training;Balance/vestibular training;Cognitive remediation/compensation;Community reintegration;Discharge planning;Disease management/prevention;DME/adaptive equipment instruction;Functional electrical stimulation;Functional mobility training;Neuromuscular re-education;Pain management;Patient/family education;Psychosocial  support;Skin care/wound management;Splinting/orthotics;Stair training;Therapeutic Activities;Therapeutic Exercise;UE/LE Strength taining/ROM;UE/LE Coordination activities;Visual/perceptual remediation/compensation PT Transfers Anticipated Outcome(s): supervision using LRAD PT Locomotion Anticipated Outcome(s): supervision using LRAD PT Recommendation Recommendations for Other Services: Neuropsych consult;Therapeutic Recreation consult Therapeutic Recreation Interventions: Outing/community reintergration;Stress management Follow Up Recommendations: Outpatient PT;24 hour supervision/assistance Patient destination: Home Equipment Recommended: To be determined   PT Evaluation Precautions/Restrictions Precautions Precautions: Fall;Other (comment) Precaution Comments: monitor for orthostatic hypotension Restrictions Weight Bearing Restrictions: No  Pain Pain Assessment Pain Scale: 0-10 Pain Score: 0-No pain (reports pain in chest if coughing or moving a lot)  Reports chronic R knee pain that feels exacerbated due to decreased mobility while in hospital Home Living/Prior Functioning Home Living Living Arrangements: Spouse/significant other Available Help at Discharge: Available 24 hours/day;Family;Friend(s) (wife is in process of arranging a schedule to provide pt 24hr support including family, friends, and hired help) Type of Home: House Home Access: Stairs to enter Technical brewer of Steps: 3STE onto back porch without handrails and 8 STE front with B HRs (wide) and 1 small step-up/threshold into home Entrance Stairs-Rails: Can reach both Home Layout: One level Bathroom Shower/Tub: Chiropodist: Standard  Lives With: Spouse Prior Function Level of Independence: Independent with gait;Independent with transfers;Independent with basic ADLs;Independent with homemaking with ambulation  Able to Take Stairs?: Yes Driving: Yes Vocation: Full time  employment Vocation Requirements: heating and air, Dealer and plumbing Leisure: Hobbies-yes (Comment) Comments: hunting, Training and development officer Perception: Impaired Inattention/Neglect: Does not attend to left side of body Praxis Praxis: Intact  Cognition  Overall Cognitive Status: Impaired/Different from baseline Arousal/Alertness: Awake/alert Orientation Level: Oriented X4 Attention: Focused;Sustained Focused Attention: Appears intact Sustained Attention: Appears intact Selective Attention: Impaired Memory: Impaired Awareness: Impaired Safety/Judgment: Impaired (will continue to monitor) Comments: towards end of session pt initiated getting out of w/c despite therapist not in position to assist and having just educated on need for staff to assist with mobility Sensation Sensation Light Touch: Appears  Intact Hot/Cold: Not tested Proprioception: Impaired Detail Proprioception Impaired Details: Impaired LLE (during ambulation) Stereognosis: Not tested Coordination Gross Motor Movements are Fluid and Coordinated: No Coordination and Movement Description: L LE incoordination with gait, impaired standing balance with L lean Heel Shin Test: L LE decreased coordination due to R (difficult to determine if due to proximal weakness or impaired coordination), some difficult on R LE due to knee pain Motor  Motor Motor: Hemiplegia Motor - Skilled Clinical Observations: mild L hemiparesis, impaired midline orientation   Trunk/Postural Assessment  Cervical Assessment Cervical Assessment: Within Functional Limits Thoracic Assessment Thoracic Assessment: Within Functional Limits Lumbar Assessment Lumbar Assessment: Within Functional Limits Postural Control Postural Control: Deficits on evaluation Righting Reactions: impaired in standing Postural Limitations: decreased with L lean  Balance Balance Balance Assessed: Yes Static Sitting Balance Static Sitting - Balance  Support: Feet supported Static Sitting - Level of Assistance: 5: Stand by assistance Dynamic Sitting Balance Dynamic Sitting - Balance Support: Feet supported Dynamic Sitting - Level of Assistance: 5: Stand by assistance Static Standing Balance Static Standing - Balance Support: During functional activity Static Standing - Level of Assistance: 4: Min assist Dynamic Standing Balance Dynamic Standing - Balance Support: During functional activity;Left upper extremity supported Dynamic Standing - Level of Assistance: 3: Mod assist Extremity Assessment      RLE Assessment RLE Assessment: Exceptions to [facility] Active Range of Motion (AROM) Comments: WFL - slow movement at the knee due to pain RLE Strength Right Hip Flexion: 4/5 Right Knee Flexion: 4/5 (reports chronic knee pain currently exacerbated) Right Knee Extension: 4/5 (reports chronic knee pain currently exacerbated) Right Ankle Dorsiflexion: 4+/5 Right Ankle Plantar Flexion: 4+/5 LLE Assessment LLE Assessment: Exceptions to [facility] Active Range of Motion (AROM) Comments: WFL General Strength Comments: more proximal weakness LLE Strength Left Hip Flexion: 3+/5 Left Knee Flexion: 4/5 Left Knee Extension: 4/5 Left Ankle Dorsiflexion: 4/5 Left Ankle Plantar Flexion: 4/5  Care Tool Care Tool Bed Mobility Roll left and right activity   Roll left and right assist level: Supervision/Verbal cueing    Sit to lying activity   Sit to lying assist level: Minimal Assistance - Patient > 75%    Lying to sitting edge of bed activity   Lying to sitting edge of bed assist level: Moderate Assistance - Patient 50 - 74% (HOB flat and no bedrails)     Care Tool Transfers Sit to stand transfer   Sit to stand assist level: Minimal Assistance - Patient > 75%    Chair/bed transfer   Chair/bed transfer assist level: Minimal Assistance - Patient > 75%     Physiological scientist transfer assist level: Moderate Assistance -  Patient 50 - 74%      Care Tool Locomotion Ambulation   Assist level: Moderate Assistance - Patient 50 - 74% Assistive device: Hand held assist (L UE around therapist's shoulders) Max distance: 74f  Walk 10 feet activity   Assist level: Minimal Assistance - Patient > 75% Assistive device: Hand held assist (L UE around therapist's shoulders)   Walk 50 feet with 2 turns activity   Assist level: Moderate Assistance - Patient - 50 - 74% Assistive device: Hand held assist (L UE around therapist's shoulders)  Walk 150 feet activity Walk 150 feet activity did not occur: Safety/medical concerns      Walk 10 feet on uneven surfaces activity Walk 10 feet on uneven surfaces activity did not occur: Safety/medical concerns  Stairs   Assist level: Minimal Assistance - Patient > 75% Stairs assistive device: 2 hand rails    Walk up/down 1 step activity   Walk up/down 1 step (curb) assist level: Minimal Assistance - Patient > 75% Walk up/down 1 step or curb assistive device: 2 hand rails    Walk up/down 4 steps activity Walk up/down 4 steps assist level: Minimal Assistance - Patient > 75% Walk up/down 4 steps assistive device: 2 hand rails  Walk up/down 12 steps activity Walk up/down 12 steps activity did not occur: Safety/medical concerns      Pick up small objects from floor Pick up small object from the floor (from standing position) activity did not occur: Safety/medical concerns      Wheelchair Will patient use wheelchair at discharge?: No          Wheel 50 feet with 2 turns activity      Wheel 150 feet activity        Refer to Care Plan for Long Term Goals  SHORT TERM GOAL WEEK 1 PT Short Term Goal 1 (Week 1): Pt will perform supine<>sit with CGA PT Short Term Goal 2 (Week 1): Pt will perform sit<>stands using LRAD with CGA PT Short Term Goal 3 (Week 1): Pt will perform bed<>chair transfers using LRAD with CGA PT Short Term Goal 4 (Week 1): Pt will ambulate at least  124f using LRAD with CGA PT Short Term Goal 5 (Week 1): Pt will ascend/descend 8 steps using 1 HR with CGA  Recommendations for other services: Neuropsych and Therapeutic Recreation  Stress management and Outing/community reintegration  Skilled Therapeutic Intervention Pt received supine in bed and agreeable to therapy session. Pt's friend calls to participate in prayer with patient. Evaluation completed (see details above) with patient education regarding purpose of PT evaluation, PT POC and goals, therapy schedule, weekly team meetings, and other CIR information including safety plan and fall risk safety. Pt performed the below mobility tasks with specified level of assistance with therapist providing cuing for sequencing, technique, and normalized movement. Assessed vitals during session as described below. Pt demos L lean in standing and gait with decreased L LE coordination causing excessive hip adduction. Therapist provided pt with different w/c for improved fit to increase upright, OOB activity tolerance; however, pt would benefit from a w/c with increased depth due to femur length.  At end of session pt agreeable to remain sitting up in w/c - left with needs in reach and seat belt alarm on.   Vitals:  Knee high TED hose donned at beginning of sesssion Supine: BP 114/75 (MAP 87), HR 67bpm Sitting: BP 97/73 (MAP 79), HR 68bpm Standing: BP 85/63 (MAP 71), HR 102bpm - pt reporting "lightheadedness" Sitting: BP 101/72 (MAP 82), HR 68bpm After ambulation: BP 124/107 (MAP 113), HR 71bpm After stair navigation: BP 100/72 (MAP 79), HR 75bpm, SpO2 100% RN made aware of pt's vitals during session.   Mobility Bed Mobility Bed Mobility: Sit to Supine;Supine to Sit Supine to Sit: Minimal Assistance - Patient > 75%;Moderate Assistance - Patient 50-74% Sit to Supine: Minimal Assistance - Patient > 75% Transfers Transfers: Sit to Stand;Stand to Sit;Stand Pivot Transfers Sit to Stand: Minimal  Assistance - Patient > 75% Stand to Sit: Minimal Assistance - Patient > 75% Stand Pivot Transfers: Minimal Assistance - Patient > 75% Stand Pivot Transfer Details: Tactile cues for sequencing;Tactile cues for weight shifting;Verbal cues for sequencing;Verbal cues for technique;Manual facilitation for weight shifting Transfer (Assistive device): 1 person  hand held assist Locomotion  Gait Ambulation: Yes Gait Assistance: Minimal Assistance - Patient > 75%;Moderate Assistance - Patient 50-74% Gait Distance (Feet): 97 Feet Assistive device: 1 person hand held assist (L UE support around therapist's shoulders) Gait Assistance Details: Tactile cues for weight shifting;Tactile cues for sequencing;Tactile cues for posture;Verbal cues for sequencing;Verbal cues for technique;Verbal cues for gait pattern;Manual facilitation for weight shifting;Visual cues/gestures for sequencing Gait Gait: Yes Gait Pattern: Impaired Gait Pattern: Step-through pattern;Decreased step length - right;Decreased step length - left;Lateral trunk lean to left;Narrow base of support Gait velocity: decreased Stairs / Additional Locomotion Stairs: Yes Stairs Assistance: Minimal Assistance - Patient > 75% Stair Management Technique: Two rails Number of Stairs: 8 Height of Stairs: 6 Wheelchair Mobility Wheelchair Mobility: No   Discharge Criteria: Patient will be discharged from PT if patient refuses treatment 3 consecutive times without medical reason, if treatment goals not met, if there is a change in medical status, if patient makes no progress towards goals or if patient is discharged from hospital.  The above assessment, treatment plan, treatment alternatives and goals were discussed and mutually agreed upon: by patient and by family (wife, Glenard Haring)  Tawana Scale , PT, DPT, CSRS  02/29/2020, 10:34 AM

## 2020-02-29 NOTE — Progress Notes (Addendum)
[place] PHYSICAL MEDICINE & REHABILITATION PROGRESS NOTE   Subjective/Complaints:  No issues overnight except pt feels like throat aggraved by Cortrak  Discussed etiology of CVA  ROS- neg CP, SOB,N/V/D  Objective:   No results found. Recent Labs    02/28/20 0128 02/29/20 0532  WBC 11.2* 9.1  HGB 10.6* 11.0*  HCT 32.6* 33.9*  PLT 749* 808*   Recent Labs    02/28/20 0128 02/29/20 0532  NA 132* 133*  K 4.5 4.3  CL 99 100  CO2 23 22  GLUCOSE 204* 153*  BUN 35* 30*  CREATININE 0.92 1.05  CALCIUM 8.9 9.4    Intake/Output Summary (Last 24 hours) at 02/29/2020 0752 Last data filed at 02/29/2020 0547 Gross per 24 hour  Intake 417 ml  Output 2575 ml  Net -2158 ml        Physical Exam: Vital Signs Blood pressure 107/60, pulse 73, temperature 98.2 F (36.8 C), temperature source Oral, resp. rate 16, height 5\' 10"  (1.778 m), weight 94.1 kg, SpO2 99 %. HEENT Cortrak in place  General: No acute distress Mood and affect irritable but  appropriate Heart: Regular rate and rhythm no rubs murmurs or extra sounds Lungs: Clear to auscultation, breathing unlabored, no rales or wheezes Abdomen: Positive bowel sounds, soft nontender to palpation, nondistended Extremities: No clubbing, cyanosis, or edema Skin: No evidence of breakdown, no evidence of rash Neurologic: Cranial nerves II through XII intact, motor strength is 5/5 in bilateral deltoid, bicep, tricep, grip, hip flexor, knee extensors, ankle dorsiflexor and plantar flexor Sensory exam normal sensation to light touch and proprioception in bilateral upper and lower extremities  Musculoskeletal: Full range of motion in all 4 extremities. No joint swelling    Assessment/Plan: 1. Functional deficits which require 3+ hours per day of interdisciplinary therapy in a comprehensive inpatient rehab setting.  Physiatrist is providing close team supervision and 24 hour management of active medical problems listed  below.  Physiatrist and rehab team continue to assess barriers to discharge/monitor patient progress toward functional and medical goals  Care Tool:  Bathing              Bathing assist       Upper Body Dressing/Undressing Upper body dressing   What is the patient wearing?: Hospital gown only    Upper body assist Assist Level: Minimal Assistance - Patient > 75%    Lower Body Dressing/Undressing Lower body dressing            Lower body assist       Toileting Toileting    Toileting assist       Transfers Chair/bed transfer  Transfers assist           Locomotion Ambulation   Ambulation assist              Walk 10 feet activity   Assist           Walk 50 feet activity   Assist           Walk 150 feet activity   Assist           Walk 10 feet on uneven surface  activity   Assist           Wheelchair     Assist               Wheelchair 50 feet with 2 turns activity    Assist            Wheelchair  150 feet activity     Assist          Blood pressure 107/60, pulse 73, temperature 98.2 F (36.8 C), temperature source Oral, resp. rate 16, height 5\' 10"  (1.778 m), weight 94.1 kg, SpO2 99 %.    Medical Problem List and Plan: 1.  Left hemiparesis secondary to multiple small acute infarcts Bilateral frontal lobe, right thalamic microhemorrhage after cardiac arrest with successful PCI             -patient may  Shower- careful with cortrak             -ELOS/Goals: 2 weeks -supervision therapy evals to day  2.  Antithrombotics: -DVT/anticoagulation: SCDs             -antiplatelet therapy: Aspirin 81 mg daily, Plavix 75 mg daily 3. Pain Management: Oxycodone as needed 4. Mood: Valproic acid 250 mg twice daily, Rozerem 8 mg nightly, melatonin 3 mg nightly as needed             -antipsychotic agents: Seroquel 50 mg nightly 5. Neuropsych: This patient is not capable of making decisions on his  own behalf. 6. Skin/Wound Care: Routine skin checks 7. Fluids/Electrolytes/Nutrition: Routine in and outs with follow-up chemistries 8.  Acute systolic congestive heart failure.  Continue low-dose Entresto 24-26 mg twice daily, Aldactone 25 mg daily and Coreg 3.125 mg twice daily 9.  Ileus.  Dysphagia-  Cortrak placed 02/21/2020 hoping to remove soon.  Diet slowly advanced. Get Calorie count x 24 hours and see if can remove Cortrak. Ate 100% meal x 1 , fluid intake is low  10.  Diabetes mellitus.  Hemoglobin A1c 6.0.  Currently on Levemir 5 units daily.  Question change to Jardiance at discharge. CBG (last 3)  Recent Labs    02/28/20 2022 02/29/20 0021 02/29/20 0432  GLUCAP 117* 138* 183*    LOS: 1 days A FACE TO FACE EVALUATION WAS PERFORMED  03/02/20 02/29/2020, 7:52 AM

## 2020-02-29 NOTE — Progress Notes (Signed)
Standley Brooking, RN  Rehab Admission Coordinator  Physical Medicine and Rehabilitation  PMR Pre-admission      Signed  Date of Service:  02/28/2020 11:35 AM      Related encounter: ED to Hosp-Admission (Discharged) from 02/15/2020 in [facility] PROGRESSIVE CARE       Signed          Show:Clear all [x] Manual[x] Template[x] Copied  Added by: [x] , RN   [] Hover for details  PMR Admission Coordinator Pre-Admission Assessment   Patient: Jeffrey Morse is an 65 y.o., male MRN: DOB: December 15, 1955 Height: 5\' 10"  (177.8 cm) Weight: 99.1 kg                                                                                                                                                  Insurance Information HMO:     PPO: yes     PCP:      IPA:      80/20:      OTHER:  PRIMARY: State BCBS of       Policy#: 64      Subscriber: pt CM Name: [id number]       Phone#: [phone]     Fax#: Pre-Cert#: [id number] approved until 2/7 when updates are due      Employer:  Benefits:  Phone #: [phone]     Name: 1/24 Eff. Date: 02/04/2020     Deduct: $1500      Out of Pocket Max: $5900      Life Max: none  CIR: $337 per admit then covers 100%      SNF: 70% limit 100 days per year Outpatient: $72 per visit     Co-Pay: visits per medical neccesity Home Health: 70%      Co-Pay: visits per medical neccesity DME: 70%     Co-Pay: 30% Providers: in network  SECONDARY: none      Policy#:       Phone#:    4/7:       Phone#:    The [phone] Information Summary" for patients in Inpatient Rehabilitation Facilities with attached "Privacy Act Statement-Health Care Records" was provided and verbally reviewed with: N/A   Emergency Contact Information         Contact Information     Name Relation Home Work [place] Spouse [phone]   [phone]    Engineer, materials     [place]       Current  Medical History  Patient Admitting Diagnosis: CVA. Encephalopathy after PEA arrest   History of Present Illness:  65 year old right-handed male with history of hypertension as well as hyperlipidemia.    Presented 02/15/2020 with chest pain and went to the urgent care where he collapsed and CPR was initiated.  He was in ventricular fibrillation and  was shocked successfully.  CPR was done for approximately 15 minutes.  While in the ambulance he lost pulse again went to PEA arrest CPR resumed.  He did require intubation.  Patient was hypotensive but improved with epinephrine.  Echocardiogram revealed severely reduced LV systolic function no evidence of pericardial effusion.  EKG after CPR showed evidence of inferior ST elevation with lateral involvement suggestive of left circumflex occlusion.  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  Recurrent Vt after cardiac cath defibrillated x1 maintained on amiodarone.  Neurology consulted 02/17/2020 for left-sided weakness.  EEG negative.  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  New right thalamic microhemorrhage as compared to scan from 01/18/2012.  MRA was unremarkable.  Currently maintained on aspirin and Plavix for CVA prophylaxis.  Hospital course ileus and cortrak placed 02/21/2020 diet slowly advanced dysphagia #2 thin liquids.  Findings of elevated hemoglobin A1c 6.0 currently maintained on Levemir.  Bouts of agitation and restlessness maintained on Seroquel as well as valproic acid.  Patient did have persistent fevers there was some question related to right upper extremity thrombus versus pneumonia it did resolve after Precedex stop so felt to be possibly drug fever.  Venous Doppler study was negative.    Complete NIHSS TOTAL: 4 Glasgow Coma Scale Score: 14   Past Medical History      Past Medical History:  Diagnosis Date  . HTN (hypertension)    . Hypercholesteremia    .  Sleep apnea        Family History  family history includes Colon cancer in his mother; Stroke in his father.   Prior Rehab/Hospitalizations:  Has the patient had prior rehab or hospitalizations prior to admission? Yes   Has the patient had major surgery during 100 days prior to admission? Yes   Current Medications    Current Facility-Administered Medications:  .  acetaminophen (TYLENOL) tablet 650 mg, 650 mg, Per Tube, Q4H PRN, 650 mg at 02/21/20 1410 **OR** acetaminophen (TYLENOL) 160 MG/5ML solution 650 mg, 650 mg, Per Tube, Q4H PRN, 650 mg at 02/27/20 0952 **OR** acetaminophen (TYLENOL) suppository 650 mg, 650 mg, Rectal, Q4H PRN, Bensimhon, Bevelyn Buckles, MD, 650 mg at 02/18/20 0926 .  aspirin chewable tablet 81 mg, 81 mg, Oral, Daily, Bensimhon, Bevelyn Buckles, MD, 81 mg at 02/28/20 5397 .  carvedilol (COREG) tablet 3.125 mg, 3.125 mg, Per Tube, BID WC, Antoine Poche, MD, 3.125 mg at 02/28/20 6734 .  Chlorhexidine Gluconate Cloth 2 % PADS 6 each, 6 each, Topical, Daily, Bensimhon, Bevelyn Buckles, MD, 6 each at 02/28/20 [phone] .  clopidogrel (PLAVIX) tablet 75 mg, 75 mg, Per Tube, Daily, Antoine Poche, MD, 75 mg at 02/28/20 0828 .  dexmedetomidine (PRECEDEX) 400 MCG/100ML (4 mcg/mL) infusion, 0.2 mcg/kg/hr, Intravenous, Titrated, Liggett, Lyman Speller, NP, Stopped at 02/26/20 1008 .  empagliflozin (JARDIANCE) tablet 10 mg, 10 mg, Oral, Daily, Winfrey, William B, MD .  feeding supplement (ENSURE ENLIVE / ENSURE PLUS) liquid 237 mL, 237 mL, Per Tube, BID BM, Antoine Poche, MD, 237 mL at 02/28/20 0829 .  feeding supplement (OSMOLITE 1.5 CAL) liquid 1,000 mL, 1,000 mL, Per Tube, Continuous, Bensimhon, Bevelyn Buckles, MD, Stopped at 02/28/20 [phone] .  feeding supplement (PROSource TF) liquid 45 mL, 45 mL, Per Tube, QID, Bensimhon, Bevelyn Buckles, MD, 45 mL at 02/28/20 0828 .  fiber (NUTRISOURCE FIBER) 1 packet, 1 packet, Per Tube, BID, Bensimhon, Bevelyn Buckles, MD, 1 packet at 02/28/20  8469 .  insulin aspart (novoLOG)  injection 0-15 Units, 0-15 Units, Subcutaneous, TID AC & HS, Bensimhon, Bevelyn Buckles, MD, 5 Units at 02/28/20 [phone] .  insulin glargine (LANTUS) injection 9 Units, 9 Units, Subcutaneous, QHS, Bensimhon, Bevelyn Buckles, MD .  MEDLINE mouth rinse, 15 mL, Mouth Rinse, BID, Bensimhon, Bevelyn Buckles, MD, 15 mL at 02/28/20 0829 .  melatonin tablet 3 mg, 3 mg, Per Tube, QHS PRN, Migdalia Dk, MD, 3 mg at 02/26/20 2332 .  nystatin (MYCOSTATIN) 100000 UNIT/ML suspension 500,000 Units, 5 mL, Mouth/Throat, QID, Lorin Glass, MD, 500,000 Units at 02/28/20 [phone] .  oxyCODONE (Oxy IR/ROXICODONE) immediate release tablet 5 mg, 5 mg, Oral, Once PRN, Clerance Lav, MD .  pantoprazole sodium (PROTONIX) 40 mg/20 mL oral suspension 40 mg, 40 mg, Per Tube, Daily, Antoine Poche, MD, 40 mg at 02/28/20 3244 .  QUEtiapine (SEROQUEL) tablet 50 mg, 50 mg, Oral, QHS, Bensimhon, Bevelyn Buckles, MD, 50 mg at 02/27/20 2259 .  ramelteon (ROZEREM) tablet 8 mg, 8 mg, Oral, QHS, Angelita Ingles, MD, 8 mg at 02/27/20 2259 .  rosuvastatin (CRESTOR) tablet 40 mg, 40 mg, Per Tube, Daily, Winfrey, Kimberlee Nearing, MD .  sacubitril-valsartan (ENTRESTO) 24-26 mg per tablet, 1 tablet, Per Tube, BID, Antoine Poche, MD, 1 tablet at 02/28/20 [phone] .  spironolactone (ALDACTONE) tablet 25 mg, 25 mg, Per Tube, Daily, Antoine Poche, MD, 25 mg at 02/28/20 7253 .  valproic acid (DEPAKENE) 250 MG/5ML solution 250 mg, 250 mg, Per Tube, BID, Micki Riley, MD, 250 mg at 02/28/20 6644   Patients Current Diet:     Diet Order                      DIET DYS 2 Room service appropriate? Yes with Assist; Fluid consistency: Thin  Diet effective now                      Precautions / Restrictions Precautions Precautions: Fall Precaution Comments: foley, NG tube Restrictions Weight Bearing Restrictions: No RLE Weight Bearing: Non weight bearing Other Position/Activity Restrictions: patient with improved AROM to LLE and L shoulder    Has the patient  had 2 or more falls or a fall with injury in the past year?No   Prior Activity Level Community (5-7x/wk): independent, very active; works in [employer]ating [employer]   Prior Functional Level Prior Function Level of Independence: Independent Comments: independent, works in Hospital doctor, working the day prior to MI   Self Care: Did the patient need help bathing, dressing, using the toilet or eating?  Independent   Indoor Mobility: Did the patient need assistance with walking from room to room (with or without device)? Independent   Stairs: Did the patient need assistance with internal or external stairs (with or without device)? Independent   Functional Cognition: Did the patient need help planning regular tasks such as shopping or remembering to take medications? Independent   Home Assistive Devices / Equipment Home Assistive Devices/Equipment: None Home Equipment: None   Prior Device Use: Indicate devices/aids used by the patient prior to current illness, exacerbation or injury? None of the above   Current Functional Level Cognition   Overall Cognitive Status: Impaired/Different from baseline Difficult to assess due to: Level of arousal,Intubated Current Attention Level: Selective Orientation Level: Oriented to person,Oriented to place,Disoriented to time,Disoriented to situation Following Commands: Follows one step commands with increased time,Follows multi-step commands inconsistently Safety/Judgement: Decreased awareness of safety,Decreased  awareness of deficits General Comments: pt very interactive, following commands, still uses humor to navigate conversation. Pt with decreased insight to safety and deficits. Pt became irritated at end of session after sitting in chair regarding going to rehab but wife returned and was able to calm pt down    Extremity Assessment (includes Sensation/Coordination)   Upper Extremity Assessment: Defer to OT evaluation LUE Deficits / Details: nurse stated L  hemi paresis  Lower Extremity Assessment: Defer to PT evaluation RLE Deficits / Details: pt wiggles toes of RLE and A/AROM hip and knee LLE Deficits / Details: pt wiggles toes of RLE and A/AROM hip and knee     ADLs   Overall ADL's : Needs assistance/impaired Eating/Feeding: NPO Grooming: Oral care,Standing,Supervision/safety,Cueing for sequencing,Minimal assistance,Moderate assistance Grooming Details (indicate cue type and reason): MOD A - MIN A at times for standing balance with pt heavily leaning to L side, supervision for sequencing task of oral care. Upper Body Bathing: Maximal assistance,Bed level Upper Body Bathing Details (indicate cue type and reason): balance support Lower Body Bathing: Total assistance,Bed level Upper Body Dressing : Minimal assistance,Sitting Upper Body Dressing Details (indicate cue type and reason): to don gown as back side cover Lower Body Dressing: Minimal assistance,Sitting/lateral leans Lower Body Dressing Details (indicate cue type and reason): to don socks from EOB Toilet Transfer: Minimal assistance,RW,Ambulation,+2 for safety/equipment Toilet Transfer Details (indicate cue type and reason): MIN A +2 for safety for simulated toilet transfer Toileting- Clothing Manipulation and Hygiene: Total assistance,Bed level Functional mobility during ADLs: Minimal assistance,+2 for safety/equipment General ADL Comments: pt continues to present with impaired balance, decreased activity tolerance, L sided weakness and cognitive deficits     Mobility   Overal bed mobility: Needs Assistance Bed Mobility: Rolling,Sidelying to Sit,Sit to Supine Rolling: Min guard,Min assist Sidelying to sit: Min assist Supine to sit: Min assist,+2 for safety/equipment Sit to supine: Mod assist General bed mobility comments: max verbal directional cues, increased time, minA for trunk elevation, modA for LE management and safety to return to supine as pt was impulsive and irritated      Transfers   Overall transfer level: Needs assistance Equipment used: Rolling walker (2 wheeled) Transfer via Lift Equipment: Stedy Transfers: Sit to/from Stand Sit to Stand: Mod assist Squat pivot transfers: Max assist,+2 physical assistance General transfer comment: max directional verbal cues for safe hand placement, modA to power up and steady during transition of hands     Ambulation / Gait / Stairs / Wheelchair Mobility   Ambulation/Gait Ambulation/Gait assistance: Mod assist,+2 safety/equipment Gait Distance (Feet): 35 Feet (x1, 45' x1) Assistive device: Rolling walker (2 wheeled) Gait Pattern/deviations: Step-through pattern,Decreased stride length,Decreased weight shift to right,Ataxic,Narrow base of support General Gait Details: pt with very narrow base of support requiring max and freq verbal cues to increase, pt with L bias requiring max tactile and verbal cues to stay in center of RW. pt remains ataxic however improved from yesterday Gait velocity: slow Gait velocity interpretation: <1.8 ft/sec, indicate of risk for recurrent falls     Posture / Balance Dynamic Sitting Balance Sitting balance - Comments: pt with posterior lean when attempting to don socks requiring min/modA Balance Overall balance assessment: Needs assistance Sitting-balance support: No upper extremity supported,Feet supported Sitting balance-Leahy Scale: Fair Sitting balance - Comments: pt with posterior lean when attempting to don socks requiring min/modA Postural control: Posterior lean Standing balance support: Bilateral upper extremity supported,During functional activity,Single extremity supported Standing balance-Leahy Scale: Poor Standing balance comment: dependent on  UE support     Special needs/care consideration Designated visitor is wife, Agustin CreeDarlene Cortrak 43 inches 10 fr left nare placed 02/21/20 #16 fr latex urethral catheter placed 1/20 Very impulsive; decreased safety awareness       Previous Home Environment  Living Arrangements: Spouse/significant other  Lives With: Spouse Available Help at Discharge: Family,Available 24 hours/day (wife to take FMLA and pt's sister and family to assist) Type of Home: Mobile home Home Layout: One level Home Access: Stairs to enter Entrance Stairs-Rails: Can reach both Entrance Stairs-Number of Steps: 3 Bathroom Shower/Tub: Tub only FirefighterBathroom Toilet: Standard Bathroom Accessibility: Yes How Accessible: Accessible via walker Home Care Services: No   Discharge Living Setting Plans for Discharge Living Setting: Patient's home,Mobile Home,Lives with (comment) (wife) Type of Home at Discharge: Mobile home Discharge Home Layout: One level Discharge Home Access: Stairs to enter Entrance Stairs-Rails: Right,Left,Can reach both Entrance Stairs-Number of Steps: 3 Discharge Bathroom Shower/Tub: Tub only Discharge Bathroom Toilet: Standard Discharge Bathroom Accessibility: Yes How Accessible: Accessible via walker Does the patient have any problems obtaining your medications?: No   Social/Family/Support Systems 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) Contact Information: wife, Darlene Anticipated Caregiver: wife, pt's sister and other family members Anticipated Caregiver's Contact Information: cell [phone] Ability/Limitations of Caregiver: wife teaches so will take fmla Caregiver Availability: 24/7 Discharge Plan Discussed with Primary Caregiver: Yes Is Caregiver In Agreement with Plan?: Yes Does Caregiver/Family have Issues with Lodging/Transportation while Pt is in Rehab?: No   Goals Patient/Family Goal for Rehab: supervision wiht PT, OT and SLP Expected length of stay: ELOS 2 weeks Pt/Family Agrees to Admission and willing to participate: Yes Program Orientation Provided & Reviewed with Pt/Caregiver Including Roles  & Responsibilities: Yes   Decrease burden of Care through IP rehab admission: n/a   Possible need for SNF  placement upon discharge:not anticipated   Patient Condition: This patient's medical and functional status has changed since the consult dated: 02/23/2020 in which the Rehabilitation Physician determined and documented that the patient's condition is appropriate for intensive rehabilitative care in an inpatient rehabilitation facility. See "History of Present Illness" (above) for medical update. Functional changes are: overall min to mod assist. Patient's medical and functional status update has been discussed with the Rehabilitation physician and patient remains appropriate for inpatient rehabilitation. Will admit to inpatient rehab today.   Preadmission Screen Completed By:  Clois DupesBoyette, Harper Smoker Godwin, RN, 02/28/2020 11:35 AM ______________________________________________________________________   Discussed status with Dr. Berline ChoughLovorn on 02/28/2020 at  1145 and received approval for admission today.   Admission Coordinator:  Clois DupesBoyette, Tyjae Shvartsman Godwin, time [phone] Date 02/28/2020             Cosigned by: Genice RougeLovorn, Megan, MD at 02/28/2020 12:03 PM    Revision History                    Note Details  Author Standley BrookingBoyette, Yamil Oelke G, RN File Time 02/28/2020 11:45 AM  Author Type Rehab Admission Coordinator Status Signed  Last Editor Standley BrookingBoyette, Seanna Sisler G, RN Service [facility]  Hospital Acct # [id number] Admit Date 02/28/2020

## 2020-02-29 NOTE — Progress Notes (Signed)
Initial Nutrition Assessment  DOCUMENTATION CODES:   Not applicable  INTERVENTION:   - 24-hour calorie count completed, please see calorie count note for details  - Ensure Enlive po BID, each supplement provides 350 kcal and 20 grams of protein  - Magic Cup BID with meals, each supplement provides 290 kcal and 9 grams of protein  - MVI with minerals daily  - d/c Nutrisource Fiber per tube  NUTRITION DIAGNOSIS:   Increased nutrient needs related to post-op healing,other (therapies) as evidenced by estimated needs.  GOAL:   Patient will meet greater than or equal to 90% of their needs  MONITOR:   PO intake,Supplement acceptance,Labs,Weight trends  REASON FOR ASSESSMENT:   Consult Calorie Count  ASSESSMENT:   65 year old male with PMH of HTN, HLD. Presented 02/15/20 after PEA arrest. Pt required intubation. 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 Cortrak placed on 02/21/20 and diet slowly advanced dysphagia 2 with thin liquids. Admitted to CIR on 1/25.   1/26 - diet advanced to dysphagia 3 with thin liquids  RD consulted for 24-hour calorie count. Post-pyloric Cortrak tube removed today.  Spoke with pt briefly at bedside. Pt very sleepy at time of RD visit.  Pt reports appetite is improving and that he is really trying to eat. Discussed importance of PO intake with pt now that Cortrak tube has been removed. Pt expresses understanding. Pt willing to consume oral nutrition supplements. RD to order Ensure Enlive and [employer].  Pt reports an excellent appetite PTA. Pt states that his UBW is 178 lbs. He thinks he may have lost some weight but is unsure.  Reviewed weight history in chart. Pt with a 5 kg weight loss since 02/25/20. If accurate, this is a 5% weight loss in less than 1 week which is significant  for timeframe.  Meal Completion: 35-100%  Medications reviewed and include: jardiance, nutrisource fiber BID, SSI, lantus 9 units daily, nystatin, protonix, spironolactone  Labs reviewed: sodium 133, BUN 30 CBG's: 96-183 x 24 hours  UOP: 2575 ml x 24 hours I/O's: -2.3 L since admit  NUTRITION - FOCUSED PHYSICAL EXAM:  Flowsheet Row Most Recent Value  Orbital Region No depletion  Upper Arm Region No depletion  Thoracic and Lumbar Region No depletion  Buccal Region No depletion  Temple Region No depletion  Clavicle Bone Region Mild depletion  Clavicle and Acromion Bone Region Mild depletion  Scapular Bone Region Mild depletion  Dorsal Hand No depletion  Patellar Region Mild depletion  Anterior Thigh Region Mild depletion  Posterior Calf Region Mild depletion  Edema (RD Assessment) Mild  Hair Reviewed  Eyes Reviewed  Mouth Reviewed  Skin Reviewed  Nails Reviewed       Diet Order:   Diet Order            DIET DYS 3 Room service appropriate? Yes; Fluid consistency: Thin  Diet effective now                 EDUCATION NEEDS:   Education needs have been addressed  Skin:  Skin Assessment: Reviewed RN Assessment  Last BM:  02/28/20  Height:   Ht Readings from Last 1 Encounters:  02/28/20 5\' 10"  (1.778 m)    Weight:   Wt Readings from Last 1 Encounters:  02/28/20 94.1 kg    BMI:  Body mass index is 29.77  kg/m.  Estimated Nutritional Needs:   Kcal:  2300-2500  Protein:  120-140 grams  Fluid:  2.0 L/day    Mertie Clause, MS, RD, LDN Inpatient Clinical Dietitian Please see AMiON for contact information.

## 2020-02-29 NOTE — Progress Notes (Signed)
Pt and wife educated on insulin administration and s/sx to [employer], Nurse also educated on ways to maintain/monitor BP and BS. Educational handouts given.

## 2020-02-29 NOTE — Evaluation (Signed)
Speech Language Pathology Assessment and Plan  Patient Details  Name: AESON SAWYERS MRN: [id number] Date of Birth: [birth year]  SLP Diagnosis: Dysphagia;Cognitive Impairments  Rehab Potential: Good ELOS: 2 weeks    Today's Date: 02/29/2020 SLP Individual Time: 5638-9373 SLP Individual Time Calculation (min): 55 min   Hospital Problem: Principal Problem:   Ischemic cerebrovascular accident (CVA) of frontal lobe ([place])  Past Medical History:  Past Medical History:  Diagnosis Date  . HTN (hypertension)   . Hypercholesteremia   . Hypertension    Borderline hypertension  . Sleep apnea   . Stroke [facility])    Past Surgical History:  Past Surgical History:  Procedure Laterality Date  . BACK SURGERY    . CARDIAC CATHETERIZATION    . CORONARY STENT INTERVENTION N/A 02/15/2020   Procedure: CORONARY STENT INTERVENTION;  Surgeon: Wellington Hampshire, MD;  Location: [place] CV [facility];  Service: Cardiovascular;  Laterality: N/A;  CFX  . KNEE SURGERY Left 2012  . LEFT HEART CATH AND CORONARY ANGIOGRAPHY N/A 02/15/2020   Procedure: LEFT HEART CATH AND CORONARY ANGIOGRAPHY;  Surgeon: Wellington Hampshire, MD;  Location: [place] [facility] LAB;  Service: Cardiovascular;  Laterality: N/A;  . RIGHT HEART CATH N/A 02/15/2020   Procedure: RIGHT HEART CATH;  Surgeon: Wellington Hampshire, MD;  Location: [place] [facility];  Service: Cardiovascular;  Laterality: N/A;  . VENTRICULAR ASSIST DEVICE INSERTION N/A 02/15/2020   Procedure: VENTRICULAR ASSIST DEVICE INSERTION;  Surgeon: Wellington Hampshire, MD;  Location: [place] CV LAB;  Service: Cardiovascular;  Laterality: N/A;  iMPELLA     Assessment / Plan / Recommendation Clinical Impression   HPI: Vasco Chong. Salmons is a 65 year old right-handed male with history of hypertension as well as hyperlipidemia.  Per chart review lives with spouse independent prior to admission working full-time.  1 level home 3 steps to entry.  Presented 02/15/2020 with chest pain and  went to the urgent care where he collapsed and CPR was initiated.  He was in ventricular fibrillation and was shocked successfully.  CPR was done for approximately 15 minutes.  While in the ambulance he lost pulse again went to PEA arrest CPR resumed.  He did require intubation.  Patient was hypotensive but improved with epinephrine.  Echocardiogram revealed severely reduced LV systolic function no evidence of pericardial effusion.  EKG after CPR showed evidence of inferior ST elevation with lateral involvement suggestive of left circumflex occlusion.  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  Recurrent Vt after cardiac cath defibrillated x1 maintained on amiodarone.  Neurology consulted 02/17/2020 for left-sided weakness.  EEG negative.  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  New right thalamic microhemorrhage as compared to scan from 01/18/2012.  MRA was unremarkable.  Currently maintained on aspirin and Plavix for CVA prophylaxis.  Hospital course ileus and cortrak placed 02/21/2020 diet slowly advanced dysphagia #2 thin liquids.  Findings of elevated hemoglobin A1c 6.0 currently maintained on Levemir.  Bouts of agitation and restlessness maintained on Seroquel as well as valproic acid.  Patient did have persistent fevers there was some question related to right upper extremity thrombus versus pneumonia it did resolve after Precedex stop so felt to be possibly drug fever.  Venous Doppler study was negative.   Therapy evaluations completed due to patient decreased functional mobility left side weakness he was admitted for a comprehensive rehab program 02/28/20 and SLP evaluations were completed 02/29/20 with results as follows:  Pt presents with cognitive deficits with a mild to moderate functional impact on his safety awareness due to decreased intellectual and emergent awareness, as well as short term memory and problem solving  skills. Pt reports that he has baseline deficits in short term memory and uses note taking regularly to compensate. Wife was present at bedside and endorsed exacerbation of current deficits in setting of acute illness and CVA. Cognistat evaluation was administered and subtests that fell outside of normal limits included short term memory (2-severe), visual construction (2-moderate), and calculations (1-moderate). He also exhibited somewhat poor frustration tolerance during tasks that were challenging for him. His speech was fluent, 95% intelligible without presence of dysarthria. Expressive and receptive language skills were [facility].   Pt also presents with a mild suspect primarily esophageal dysphagia (hx of hiatal hernia) that can results in post-prandial coughing per pt and wife, however no coughing observed today. Cortrak was removed this AM. Pt's mastication and oral clearance of Dys 2 and Dys 3 textures was timely and efficient. No overt s/sx aspiration observed with solids of thin liquids. Given history of GI issues, would recommend upgrade to mechanical soft (Dys 3) textures diet, continue thin liquids, meds may be given whole with H2O. Pt should alternate solids and liquids, use slow rate, take small bites and sips. Pt will only require intermittent supervision during meals.   Recommend pt receive skills ST to address aforementioned deficits in order to ensure diet safety and efficiency and maximize his functional independence and safety prior to discharge. Pt and wife in agreement.     Skilled Therapeutic Interventions          Bedside swallow and cognitive-linguistic evaluations were administered and results were reviewed with pt (please see above for details regarding the results).   SLP Assessment  Patient will need skilled Speech Lanaguage Pathology Services during CIR admission    Recommendations  SLP Diet Recommendations: Dysphagia 3 (Mech soft);Thin Liquid Administration via:  Cup;Straw Medication Administration: Whole meds with liquid Supervision: Intermittent supervision to cue for compensatory strategies;Patient able to self feed Compensations: Minimize environmental distractions;Slow rate;Small sips/bites;Follow solids with liquid Postural Changes and/or Swallow Maneuvers: Seated upright 90 degrees;Upright 30-60 min after meal Oral Care Recommendations: Oral care BID Patient destination: Home Follow up Recommendations: 24 hour supervision/assistance;Outpatient SLP Equipment Recommended: None recommended by SLP    SLP Frequency 3 to 5 out of 7 days   SLP Duration  SLP Intensity  SLP Treatment/Interventions 2 weeks  Minumum of 1-2 x/day, 30 to 90 minutes  Cognitive remediation/compensation;Cueing hierarchy;Dysphagia/aspiration precaution training;Functional tasks;Patient/family education;Internal/external aids    Pain Pain Assessment Pain Scale: Faces Pain Score: 0-No pain Faces Pain Scale: Hurts little more Pain Type: Chronic pain Pain Location: Back Pain Descriptors / Indicators: Sore;Grimacing Pain Onset: Gradual Pain Intervention(s): RN made aware;Other (Comment) (wife provided massage) Multiple Pain Sites: No    SLP Evaluation Cognition Overall Cognitive Status: Impaired/Different from baseline Arousal/Alertness: Awake/alert Orientation Level: Oriented X4 Attention: Sustained;Selective Sustained Attention: Appears intact Selective Attention: Impaired Selective Attention Impairment: Verbal basic;Functional basic Memory: Impaired Memory Impairment: Decreased recall of new information;Decreased short term memory Decreased Short Term Memory: Verbal basic;Functional complex Executive Function: Sequencing;Organizing Sequencing: International aid/development worker: Impaired Organizing Impairment: Verbal complex;Functional complex Safety/Judgment: Appears intact  Comprehension Auditory Comprehension Overall Auditory Comprehension: Appears within  functional limits for tasks assessed Conversation: Complex Visual Recognition/Discrimination Discrimination: Within Function Limits Reading Comprehension Reading Status: Not tested Expression Expression Primary Mode of Expression: Verbal Verbal Expression Overall Verbal Expression: Appears within functional  limits for tasks assessed Non-Verbal Means of Communication: Not applicable Written Expression Written Expression: Within Functional Limits Oral Motor Oral Motor/Sensory Function Overall Oral Motor/Sensory Function: Generalized oral weakness Lingual Symmetry: Within Functional Limits Velum: Within Functional Limits Mandible: Within Functional Limits Motor Speech Overall Motor Speech: Appears within functional limits for tasks assessed Intelligibility: Intelligible Motor Speech Errors: Not applicable  Care Tool Care Tool Cognition Expression of Ideas and Wants Expression of Ideas and Wants: Without difficulty (complex and basic) - expresses complex messages without difficulty and with speech that is clear and easy to understand   Understanding Verbal and Non-Verbal Content Understanding Verbal and Non-Verbal Content: Understands (complex and basic) - clear comprehension without cues or repetitions   Memory/Recall Ability *first 3 days only Memory/Recall Ability *first 3 days only: Current season;That he or she is in a hospital/hospital unit      Intelligibility: Intelligible  Bedside Swallowing Assessment General Date of Onset: 02/15/20 Previous Swallow Assessment: BSE 02/22/20 Diet Prior to this Study: Dysphagia 2 (chopped);Thin liquids Temperature Spikes Noted: No Respiratory Status: Room air History of Recent Intubation: Yes Length of Intubations (days): 6 days Date extubated: 02/21/20 Behavior/Cognition: Alert;Cooperative;Pleasant mood;Distractible Oral Cavity - Dentition: Adequate natural dentition Self-Feeding Abilities: Able to feed self Vision: Functional  for self-feeding Patient Positioning: Upright in bed Baseline Vocal Quality: Normal Volitional Cough: Strong Volitional Swallow: Able to elicit  Oral Care Assessment Does patient have any of the following "high(er) risk" factors?: None of the above Does patient have any of the following "at risk" factors?: Other - dysphagia Patient is AT RISK: Order set for Adult Oral Care Protocol initiated -  "At Risk Patients" option selected (see row information) Ice Chips Ice chips: Not tested Thin Liquid Thin Liquid: Within functional limits Presentation: Self Fed;Straw Nectar Thick Nectar Thick Liquid: Not tested Honey Thick Honey Thick Liquid: Not tested Puree Puree: Within functional limits Presentation: Spoon;Self Fed Solid Solid: Within functional limits BSE Assessment Risk for Aspiration Impact on safety and function: Mild aspiration risk Other Related Risk Factors: History of GERD;History of esophageal-related issues;Prolonged intubation  Short Term Goals: Week 1: SLP Short Term Goal 1 (Week 1): Pt will consume current diet with Supervision A verbal cueing for use of compensatory swallowing strategies. SLP Short Term Goal 2 (Week 1): Pt will consume trials of regular texture solids at least X2 with efficient mastication, oral clearance, and Supervision A for use of swallow strategies prior to upgrade. SLP Short Term Goal 3 (Week 1): Pt will demontrate ability to problem solve functional semi-complex to complex problem solving with Min A verbal and visual cues. SLP Short Term Goal 4 (Week 1): Pt will identify 2 impairments in setting acute CVA (1 physical and 1 cognitive) provided Min A verbal/visual cues. SLP Short Term Goal 5 (Week 1): Pt will demonstrate ability to recall new and functional daily information with Min A cues for use of compensatory strategies.  Refer to Care Plan for Long Term Goals  Recommendations for other services: None   Discharge Criteria: Patient will be  discharged from SLP if patient refuses treatment 3 consecutive times without medical reason, if treatment goals not met, if there is a change in medical status, if patient makes no progress towards goals or if patient is discharged from hospital.  The above assessment, treatment plan, treatment alternatives and goals were discussed and mutually agreed upon: by patient and his wife  Arbutus Leas 02/29/2020, 10:22 AM

## 2020-02-29 NOTE — Progress Notes (Signed)
Inpatient Rehabilitation Care Coordinator Assessment and Plan Patient Details  Name: Jeffrey Morse MRN: [id number] Date of Birth: [birth year]  Today's Date: 02/29/2020  Hospital Problems: Principal Problem:   Ischemic cerebrovascular accident (CVA) of frontal lobe [facility])  Past Medical History:  Past Medical History:  Diagnosis Date  . HTN (hypertension)   . Hypercholesteremia   . Hypertension    Borderline hypertension  . Sleep apnea   . Stroke [facility])    Past Surgical History:  Past Surgical History:  Procedure Laterality Date  . BACK SURGERY    . CARDIAC CATHETERIZATION    . CORONARY STENT INTERVENTION N/A 02/15/2020   Procedure: CORONARY STENT INTERVENTION;  Surgeon: Wellington Hampshire, MD;  Location: [place] CV LAB;  Service: Cardiovascular;  Laterality: N/A;  CFX  . KNEE SURGERY Left 2012  . LEFT HEART CATH AND CORONARY ANGIOGRAPHY N/A 02/15/2020   Procedure: LEFT HEART CATH AND CORONARY ANGIOGRAPHY;  Surgeon: Wellington Hampshire, MD;  Location: [place] CV LAB;  Service: Cardiovascular;  Laterality: N/A;  . RIGHT HEART CATH N/A 02/15/2020   Procedure: RIGHT HEART CATH;  Surgeon: Wellington Hampshire, MD;  Location: [place] CV LAB;  Service: Cardiovascular;  Laterality: N/A;  . VENTRICULAR ASSIST DEVICE INSERTION N/A 02/15/2020   Procedure: VENTRICULAR ASSIST DEVICE INSERTION;  Surgeon: Wellington Hampshire, MD;  Location: [place] CV LAB;  Service: Cardiovascular;  Laterality: N/A;  iMPELLA    Social History:  reports that he has quit smoking. He has never used smokeless tobacco. He reports previous alcohol use. He reports that he does not use drugs.  Family / Support Systems Marital Status: Married Spouse/Significant Other: Darlene Children: Paul Anticipated Caregiver: Spouse, pt sister and family Ability/Limitations of Caregiver: Spouse teaches ( taking FMLA) Caregiver Availability: 24/7  Social History Preferred language: English Religion: Christian Read:  Yes Write: Yes Employment Status: Employed Art therapist)   Abuse/Neglect Abuse/Neglect Assessment Can Be Completed: Yes Physical Abuse: Denies Verbal Abuse: Denies Sexual Abuse: Denies Exploitation of patient/patient's resources: Denies Self-Neglect: Denies  Emotional Status Pt's affect, behavior and adjustment status: no Recent Psychosocial Issues: no Psychiatric History: no Substance Abuse History: no  Patient / Family Perceptions, Expectations & Goals Pt/Family understanding of illness & functional limitations: yes Premorbid pt/family roles/activities: Indpendent and working Therapist, nutritional: Chief of Staff: None Premorbid Home Care/DME Agencies: None Transportation available at discharge: family able to transport  Discharge Planning Living Arrangements: Spouse/significant other Support Systems: Spouse/significant other,Children,Other relatives Type of Residence: Private residence (1 level home, 3 steps to enter (able to reach railings on both sides)) Insurance Resources: Multimedia programmer (specify) ([employer] of [place]) Financial Resources: Employment Museum/gallery curator Screen Referred: No Living Expenses: Education officer, community Management: Patient,Spouse Does the patient have any problems obtaining your medications?: No Home Management: independent Care Coordinator Anticipated Follow Up Needs: HH/OP Expected length of stay: 2 weeks  Clinical Impression SW met with patient introduced self, explained role and progress. SW will continue to follow up with additional questions and concerns. SW will continue to follow up.  Dyanne Iha 02/29/2020, 1:24 PM

## 2020-02-29 NOTE — Progress Notes (Signed)
Inpatient Rehabilitation  Patient information reviewed and entered into eRehab system by Amarya Kuehl M. Camari Quintanilla, M.A., CCC/SLP, PPS Coordinator.  Information including medical coding, functional ability and quality indicators will be reviewed and updated through discharge.    

## 2020-02-29 NOTE — Progress Notes (Signed)
Inpatient Rehabilitation Center Individual Statement of Services  Patient Name:  Jeffrey Morse  Date:  02/29/2020  Welcome to the Inpatient Rehabilitation Center.  Our goal is to provide you with an individualized program based on your diagnosis and situation, designed to meet your specific needs.  With this comprehensive rehabilitation program, you will be expected to participate in at least 3 hours of rehabilitation therapies Monday-Friday, with modified therapy programming on the weekends.  Your rehabilitation program will include the following services:  Physical Therapy (PT), Occupational Therapy (OT), Speech Therapy (ST), 24 hour per day rehabilitation nursing, Therapeutic Recreaction (TR), Neuropsychology, Care Coordinator, Rehabilitation Medicine, Nutrition Services and Pharmacy Services  Weekly team conferences will be held on Wednesday to discuss your progress.  Your Inpatient Rehabilitation Care Coordinator will talk with you frequently to get your input and to update you on team discussions.  Team conferences with you and your family in attendance may also be held.  Expected length of stay: 2 weeks   Overall anticipated outcome: Supervision  Depending on your progress and recovery, your program may change. Your Inpatient Rehabilitation Care Coordinator will coordinate services and will keep you informed of any changes. Your Inpatient Rehabilitation Care Coordinator's name and contact numbers are listed  below.  The following services may also be recommended but are not provided by the Inpatient Rehabilitation Center:    Home Health Rehabiltiation Services  Outpatient Rehabilitation Services    Arrangements will be made to provide these services after discharge if needed.  Arrangements include referral to agencies that provide these services.  Your insurance has been verified to be:  [place] of [employer] Your primary doctor is:  Blair Heys. MD  Pertinent information will be  shared with your doctor and your insurance company.  Inpatient Rehabilitation Care Coordinator:  Lavera Guise, [place] [phone] or [phone]  Information discussed with and copy given to patient by: Andria Rhein, 02/29/2020, 11:55 AM

## 2020-02-29 NOTE — Evaluation (Signed)
Occupational Therapy Assessment and Plan  Patient Details  Name: Jeffrey Morse MRN: [id number] Date of Birth: Nov 09, 1955  OT Diagnosis: cognitive deficits and hemiplegia affecting non-dominant side Rehab Potential: Rehab Potential (ACUTE ONLY): Excellent ELOS: 12-14 days   Today's Date: 02/29/2020 OT Individual Time: 1100-1209 OT Individual Time Calculation (min): 69 min     Hospital Problem: Principal Problem:   Ischemic cerebrovascular accident (CVA) of frontal lobe ([place])   Past Medical History:  Past Medical History:  Diagnosis Date  . HTN (hypertension)   . Hypercholesteremia   . Hypertension    Borderline hypertension  . Sleep apnea   . Stroke [facility])    Past Surgical History:  Past Surgical History:  Procedure Laterality Date  . BACK SURGERY    . CARDIAC CATHETERIZATION    . CORONARY STENT INTERVENTION N/A 02/15/2020   Procedure: CORONARY STENT INTERVENTION;  Surgeon: Wellington Hampshire, MD;  Location: [facility];  Service: Cardiovascular;  Laterality: N/A;  CFX  . KNEE SURGERY Left 2012  . LEFT HEART CATH AND CORONARY ANGIOGRAPHY N/A 02/15/2020   Procedure: LEFT HEART CATH AND CORONARY ANGIOGRAPHY;  Surgeon: Wellington Hampshire, MD;  Location: [facility];  Service: Cardiovascular;  Laterality: N/A;  . RIGHT HEART CATH N/A 02/15/2020   Procedure: RIGHT HEART CATH;  Surgeon: Wellington Hampshire, MD;  Location: [facility];  Service: Cardiovascular;  Laterality: N/A;  . VENTRICULAR ASSIST DEVICE INSERTION N/A 02/15/2020   Procedure: VENTRICULAR ASSIST DEVICE INSERTION;  Surgeon: Wellington Hampshire, MD;  Location: [facility];  Service: Cardiovascular;  Laterality: N/A;  iMPELLA     Assessment & Plan Clinical Impression:   Jeffrey Renwick. Morse is a 65 year old right-handed male with history of hypertension as well as hyperlipidemia.  Per chart review lives with spouse independent prior to admission working full-time.  1 level home 3 steps to entry.   Presented 02/15/2020 with chest pain and went to the urgent care where he collapsed and CPR was initiated.  He was in ventricular fibrillation and was shocked successfully.  CPR was done for approximately 15 minutes.  While in the ambulance he lost pulse again went to PEA arrest CPR resumed.  He did require intubation.  Patient was hypotensive but improved with epinephrine.  Echocardiogram revealed severely reduced LV systolic function no evidence of pericardial effusion.  EKG after CPR showed evidence of inferior ST elevation with lateral involvement suggestive of left circumflex occlusion.  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  Recurrent Vt after cardiac cath defibrillated x1 maintained on amiodarone.  Neurology consulted 02/17/2020 for left-sided weakness.  EEG negative.  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  New right thalamic microhemorrhage as compared to scan from 01/18/2012.  MRA was unremarkable.  Currently maintained on aspirin and Plavix for CVA prophylaxis.  Hospital course ileus and cortrak placed 02/21/2020 diet slowly advanced dysphagia #2 thin liquids.  Findings of elevated hemoglobin A1c 6.0 currently maintained on Levemir.  Bouts of agitation and restlessness maintained on Seroquel as well as valproic acid.  Patient did have persistent fevers there was some question related to right upper extremity thrombus versus pneumonia it did resolve after Precedex stop so felt to be possibly drug fever.  Venous Doppler study was negative.   Therapy evaluations completed due to patient decreased functional mobility left side weakness he was admitted for a comprehensive rehab program.    Patient transferred  to CIR on 02/28/2020 .    Patient currently requires min with basic self-care skills secondary to decreased cardiorespiratoy endurance, decreased coordination, decreased attention to left, decreased awareness,  decreased problem solving, decreased memory and delayed processing and decreased standing balance, decreased postural control, hemiplegia and decreased balance strategies.  Prior to hospitalization, patient was fully independent and working full time.  Patient will benefit from skilled intervention to increase independence with basic self-care skills prior to discharge home with care partner.  Anticipate patient will require intermittent supervision and follow up outpatient.  OT - End of Session Endurance Deficit: Yes Endurance Deficit Description: after shower and dressing, pt expressed he was very tired but no c/o dizziness, light headedness.  seated BP taken 3x all very low at 78/57.  after transfer to supine with feet elevated 100/63 OT Assessment Rehab Potential (ACUTE ONLY): Excellent OT Patient demonstrates impairments in the following area(s): Balance;Cognition;Endurance;Motor;Perception;Safety OT Basic ADL's Functional Problem(s): Eating;Grooming;Bathing;Dressing;Toileting OT Transfers Functional Problem(s): Toilet;Tub/Shower OT Additional Impairment(s): None OT Plan OT Intensity: Minimum of 1-2 x/day, 45 to 90 minutes OT Frequency: 5 out of 7 days OT Duration/Estimated Length of Stay: 12-14 days OT Treatment/Interventions: Balance/vestibular training;Cognitive remediation/compensation;Discharge planning;DME/adaptive equipment instruction;Functional mobility training;Psychosocial support;Patient/family education;Pain management;Neuromuscular re-education;Self Care/advanced ADL retraining;Therapeutic Activities;Therapeutic Exercise;UE/LE Strength taining/ROM;UE/LE Coordination activities;Visual/perceptual remediation/compensation OT Self Feeding Anticipated Outcome(s): independent OT Basic Self-Care Anticipated Outcome(s): Supervision OT Toileting Anticipated Outcome(s): supervision OT Bathroom Transfers Anticipated Outcome(s): supervision OT Recommendation Patient destination:  Home Follow Up Recommendations: Outpatient OT Equipment Recommended: Tub/shower bench   OT Evaluation Precautions/Restrictions  Precautions Precautions: Fall Precaution Comments: watch for low BP Restrictions RLE Weight Bearing: Weight bearing as tolerated   Pain Pain Assessment Pain Scale: 0-10 Pain Score: 4  Faces Pain Scale: Hurts little more Pain Type: Chronic pain Pain Location: Back Pain Descriptors / Indicators: Sore;Grimacing Pain Onset: Gradual Pain Intervention(s): RN made aware;Other (Comment) (wife provided massage) Multiple Pain Sites: No Home Living/Prior Functioning Home Living Living Arrangements: Spouse/significant other Available Help at Discharge: Family,Available 24 hours/day Type of Home: House Home Access: Stairs to enter [employer] of Steps: 3 Entrance Stairs-Rails: Can reach both Home Layout: One level Bathroom Shower/Tub: Chiropodist: Standard  Lives With: Spouse Prior Function Level of Independence: Independent with basic ADLs,Independent with homemaking with ambulation,Independent with gait,Independent with transfers  Able to Take Stairs?: Yes Driving: Yes Vocation: Full time employment Leisure: Hobbies-yes (Comment) Comments: hunting, fishing Vision Baseline Vision/History: Wears glasses Wears Glasses: Reading only Vision Assessment?: Yes Eye Alignment: Within Functional Limits Ocular Range of Motion: Within Functional Limits Alignment/Gaze Preference: Within Defined Limits Tracking/Visual Pursuits: Able to track stimulus in all quads without difficulty (on first trial, pt with large head turns; 2nd trial with cues to not turn head only moved his eyes) Saccades: Within functional limits Visual Fields: No apparent deficits Perception  Perception: Impaired Inattention/Neglect: Does not attend to left side of body Praxis Praxis: Intact Cognition Overall Cognitive Status: Impaired/Different from  baseline Arousal/Alertness: Awake/alert Orientation Level: Person;Place;Situation Person: Oriented Place: Oriented Situation: Oriented Year: 2022 Month: January Day of Week: Incorrect Memory: Impaired Memory Impairment: Decreased recall of new information;Decreased short term memory Decreased Short Term Memory: Verbal basic;Functional complex Immediate Memory Recall: Sock;Blue;Bed Memory Recall Sock: With Cue Memory Recall Blue: With Cue Memory Recall Bed: Not able to recall Attention: Sustained;Selective Sustained Attention: Appears intact Selective Attention: Impaired Selective Attention Impairment: Verbal basic;Functional basic Awareness: Impaired Awareness Impairment: Intellectual impairment Executive Function: Sequencing;Organizing Sequencing: Appears intact Organizing: Impaired Organizing Impairment: Verbal complex;Functional complex  Safety/Judgment: Appears intact Sensation Sensation Light Touch: Appears Intact Hot/Cold: Appears Intact Proprioception: Appears Intact Stereognosis: Not tested Coordination Gross Motor Movements are Fluid and Coordinated: No Fine Motor Movements are Fluid and Coordinated: No Coordination and Movement Description: L side incoordination with gait Finger Nose Finger Test: slow and slight dysmetria on L Motor  Motor Motor: Hemiplegia Motor - Skilled Clinical Observations: mild L side weakness  Trunk/Postural Assessment  Cervical Assessment Cervical Assessment: Within Functional Limits Thoracic Assessment Thoracic Assessment: Within Functional Limits Lumbar Assessment Lumbar Assessment: Within Functional Limits Postural Control Postural Control: Deficits on evaluation (functional in sitting, decreased in standing)  Balance Static Sitting Balance Static Sitting - Level of Assistance: 5: Stand by assistance Dynamic Sitting Balance Dynamic Sitting - Level of Assistance: 5: Stand by assistance Static Standing Balance Static Standing  - Level of Assistance: 4: Min assist Dynamic Standing Balance Dynamic Standing - Level of Assistance: 3: Mod assist Extremity/Trunk Assessment RUE Assessment RUE Assessment: Within Functional Limits LUE Assessment Passive Range of Motion (PROM) Comments: WFL Active Range of Motion (AROM) Comments: sh flexion 100 General Strength Comments: within shoulder AROM and elbow, wrist , grasp 4+/5  Care Tool Care Tool Self Care Eating   Eating Assist Level: Supervision/Verbal cueing (cues for swallow strategies)    Oral Care    Oral Care Assist Level: Supervision/Verbal cueing    Bathing   Body parts bathed by patient: Right arm;Left arm;Chest;Abdomen;Front perineal area;Buttocks;Right upper leg;Left upper leg;Right lower leg;Left lower leg;Face     Assist Level: Contact Guard/Touching assist    Upper Body Dressing(including orthotics)   What is the patient wearing?: Pull over shirt   Assist Level: Supervision/Verbal cueing    Lower Body Dressing (excluding footwear)   What is the patient wearing?: Pants Assist for lower body dressing: Moderate Assistance - Patient 50 - 74%    Putting on/Taking off footwear   What is the patient wearing?: Non-skid slipper socks Assist for footwear: Moderate Assistance - Patient 50 - 74%       Care Tool Toileting Toileting activity   Assist for toileting: Minimal Assistance - Patient > 75%     Care Tool Bed Mobility Roll left and right activity   Roll left and right assist level: Supervision/Verbal cueing    Sit to lying activity        Lying to sitting edge of bed activity   Lying to sitting edge of bed assist level: Supervision/Verbal cueing     Care Tool Transfers Sit to stand transfer   Sit to stand assist level: Minimal Assistance - Patient > 75%    Chair/bed transfer   Chair/bed transfer assist level: Minimal Assistance - Patient > 75%     Toilet transfer   Assist Level: Minimal Assistance - Patient > 75%     Care Tool  Cognition Expression of Ideas and Wants Expression of Ideas and Wants: Without difficulty (complex and basic) - expresses complex messages without difficulty and with speech that is clear and easy to understand   Understanding Verbal and Non-Verbal Content Understanding Verbal and Non-Verbal Content: Understands (complex and basic) - clear comprehension without cues or repetitions   Memory/Recall Ability *first 3 days only Memory/Recall Ability *first 3 days only: Current season;That he or she is in a hospital/hospital unit    Refer to Care Plan for [facility] 1 OT Short Term Goal 1 (Week 1): Pt will demonstrate improved activity tolerance to be able to complete a shower  without a drop in blood pressure. OT Short Term Goal 2 (Week 1): Pt will be able to don a shirt with no cues demonstrating improved cognition. OT Short Term Goal 3 (Week 1): Pt will be able to don pants with CGA. OT Short Term Goal 4 (Week 1): Pt will demonstrate improved standing balance and awareness of L foot placement during toileting tasks with CGA.  Recommendations for other services: None    Skilled Therapeutic Intervention ADL ADL Eating: Supervision/safety Grooming: Supervision/safety Upper Body Bathing: Supervision/safety Where Assessed-Upper Body Bathing: Shower Lower Body Bathing: Contact guard Where Assessed-Lower Body Bathing: Shower Upper Body Dressing: Supervision/safety;Moderate cueing Where Assessed-Upper Body Dressing: Chair Lower Body Dressing: Moderate assistance;Moderate cueing Where Assessed-Lower Body Dressing: Chair Toileting: Minimal assistance Where Assessed-Toileting: Glass blower/designer: Psychiatric nurse Method: Psychologist, educational: Minimal assistance;Moderate cueing Social research officer, government Method: Radiographer, therapeutic: Shower seat with back;Grab bars   Pt seen for initial evaluation and Adl training  with pt and spouse with a focus on safe functional mobility, pt/family education, balance, coordination.  Reviewed role of OT, discussed pt's goals and POC and ELOS.  Pt participated well completing tasks listed above in ADL documentation.  At the end of session his blood pressure dropped. after shower and dressing, pt expressed he was very tired but no c/o dizziness, light headedness.  seated BP taken 3x all very low at 78/57.  after transfer to supine with feet elevated 100/63.  His HR and O2 sats WNL.  Pt resting in bed with all needs met, bed alarm set.      Discharge Criteria: Patient will be discharged from OT if patient refuses treatment 3 consecutive times without medical reason, if treatment goals not met, if there is a change in medical status, if patient makes no progress towards goals or if patient is discharged from hospital.  The above assessment, treatment plan, treatment alternatives and goals were discussed and mutually agreed upon: by patient and by family  Estee Yohe 02/29/2020, 1:05 PM

## 2020-03-01 ENCOUNTER — Inpatient Hospital Stay (HOSPITAL_COMMUNITY): Payer: BC Managed Care – PPO | Admitting: Speech Pathology

## 2020-03-01 ENCOUNTER — Inpatient Hospital Stay (HOSPITAL_COMMUNITY): Payer: BC Managed Care – PPO

## 2020-03-01 ENCOUNTER — Inpatient Hospital Stay (HOSPITAL_COMMUNITY): Payer: BC Managed Care – PPO | Admitting: Occupational Therapy

## 2020-03-01 LAB — GLUCOSE, CAPILLARY
Glucose-Capillary: 108 mg/dL — ABNORMAL HIGH (ref 70–99)
Glucose-Capillary: 117 mg/dL — ABNORMAL HIGH (ref 70–99)
Glucose-Capillary: 130 mg/dL — ABNORMAL HIGH (ref 70–99)
Glucose-Capillary: 132 mg/dL — ABNORMAL HIGH (ref 70–99)
Glucose-Capillary: 139 mg/dL — ABNORMAL HIGH (ref 70–99)
Glucose-Capillary: 143 mg/dL — ABNORMAL HIGH (ref 70–99)

## 2020-03-01 MED ORDER — TROLAMINE SALICYLATE 10 % EX CREA
TOPICAL_CREAM | Freq: Two times a day (BID) | CUTANEOUS | Status: DC | PRN
  Filled 2020-03-01: qty 85

## 2020-03-01 MED ORDER — MUSCLE RUB 10-15 % EX CREA
TOPICAL_CREAM | Freq: Two times a day (BID) | CUTANEOUS | Status: DC | PRN
  Filled 2020-03-01: qty 85

## 2020-03-01 NOTE — Progress Notes (Signed)
Occupational Therapy Session Note  Patient Details  Name: Jeffrey Morse MRN: [id number] Date of Birth: 1955-07-06  Today's Date: 03/01/2020 OT Individual Time: 7408-1448 OT Individual Time Calculation (min): 55 min    Short Term Goals: Week 1:  OT Short Term Goal 1 (Week 1): Pt will demonstrate improved activity tolerance to be able to complete a shower without a drop in blood pressure. OT Short Term Goal 2 (Week 1): Pt will be able to don a shirt with no cues demonstrating improved cognition. OT Short Term Goal 3 (Week 1): Pt will be able to don pants with CGA. OT Short Term Goal 4 (Week 1): Pt will demonstrate improved standing balance and awareness of L foot placement during toileting tasks with CGA.  Skilled Therapeutic Interventions/Progress Updates:    Pt in bed to start session with spouse present.  BP was taken in supine with HOB elevated at 94/63 with thigh high TEDs in place.  He transitioned to the EOB with supervision and BP was again taken at 95/67.  He did not report any dizziness or lightheadedness with transitions or with sitting EOB.  Had him next ambulate to the walk-in shower with min assist and no device.  He was able to remove all clothing sit to stand with min guard except for his thigh high TEDs which therapist assisted with.  Shower was completed in sitting with lateral leans for washing peri area with overall min guard assist.  He then dried off and transferred out to the chair beside of the bed with min assist for dressing tasks.  Abdominal binder was placed on his as well as thigh high TEDs.  His BP in sitting was still at 81/54.  Had him complete the rest of his UB dressing with setup and LB dressing with min assist.  Before transferring back to the bed he was setup with his toothbrush and completed his oral hygiene at this level.  Min guard assist was needed for transfer back to the bed.  Encouraged pt to work on [employer] coordination tasks of opening an closing small bottle  tops as well as picking up and sorting/stacking change while in bed.  He was left with the alarm in place and his spouse in the room.    Therapy Documentation Precautions:  Precautions Precautions: Fall,Other (comment) Precaution Comments: monitor for orthostatic hypotension Restrictions Weight Bearing Restrictions: No RLE Weight Bearing: Weight bearing as tolerated  Pain: Pain Assessment Pain Scale: 0-10 Pain Score: 0-No pain ADL: See Care Tool Section for some details of mobility and selfcare  Therapy/Group: Individual Therapy  Linnae Rasool OTR/L 03/01/2020, 4:45 PM

## 2020-03-01 NOTE — Progress Notes (Signed)
[facility] PHYSICAL MEDICINE & REHABILITATION PROGRESS NOTE   Subjective/Complaints:  Appetite ok, chest pain with cough , no sputum production  DIscussed hx of prolonged CPR   ROS- neg CP, SOB,N/V/D  Objective:   No results found. Recent Labs    02/28/20 0128 02/29/20 0532  WBC 11.2* 9.1  HGB 10.6* 11.0*  HCT 32.6* 33.9*  PLT 749* 808*   Recent Labs    02/28/20 0128 02/29/20 0532  NA 132* 133*  K 4.5 4.3  CL 99 100  CO2 23 22  GLUCOSE 204* 153*  BUN 35* 30*  CREATININE 0.92 1.05  CALCIUM 8.9 9.4    Intake/Output Summary (Last 24 hours) at 03/01/2020 0739 Last data filed at 03/01/2020 0304 Gross per 24 hour  Intake 250 ml  Output 200 ml  Net 50 ml        Physical Exam: Vital Signs Blood pressure 115/73, pulse 73, temperature 97.8 F (36.6 C), temperature source Oral, resp. rate 18, height 5\' 10"  (1.778 m), weight 94.1 kg, SpO2 100 %.  General: No acute distress Mood and affect are appropriate Heart: Regular rate and rhythm no rubs murmurs or extra sounds Lungs: Clear to auscultation, breathing unlabored, no rales or wheezes Abdomen: Positive bowel sounds, soft nontender to palpation, nondistended Extremities: No clubbing, cyanosis, or edema Skin: No evidence of breakdown, no evidence of rash Neuro- reduced awareness  Sensory exam normal sensation to light touch and proprioception in bilateral upper and lower extremities  Musculoskeletal: Full range of motion in all 4 extremities. No joint swelling    Assessment/Plan: 1. Functional deficits which require 3+ hours per day of interdisciplinary therapy in a comprehensive inpatient rehab setting.  Physiatrist is providing close team supervision and 24 hour management of active medical problems listed below.  Physiatrist and rehab team continue to assess barriers to discharge/monitor patient progress toward functional and medical goals  Care Tool:  Bathing    Body parts bathed by patient: Right  arm,Left arm,Chest,Abdomen,Front perineal area,Buttocks,Right upper leg,Left upper leg,Right lower leg,Left lower leg,Face         Bathing assist Assist Level: Contact Guard/Touching assist     Upper Body Dressing/Undressing Upper body dressing   What is the patient wearing?: Pull over shirt    Upper body assist Assist Level: Supervision/Verbal cueing    Lower Body Dressing/Undressing Lower body dressing      What is the patient wearing?: Underwear/pull up     Lower body assist Assist for lower body dressing: Moderate Assistance - Patient 50 - 74%     Toileting Toileting    Toileting assist Assist for toileting: Minimal Assistance - Patient > 75%     Transfers Chair/bed transfer  Transfers assist     Chair/bed transfer assist level: Minimal Assistance - Patient > 75%     Locomotion Ambulation   Ambulation assist      Assist level: Moderate Assistance - Patient 50 - 74% Assistive device: Hand held assist (L UE around therapist's shoulders) Max distance: 41ft   Walk 10 feet activity   Assist     Assist level: Minimal Assistance - Patient > 75% Assistive device: Hand held assist (L UE around therapist's shoulders)   Walk 50 feet activity   Assist    Assist level: Moderate Assistance - Patient - 50 - 74% Assistive device: Hand held assist (L UE around therapist's shoulders)    Walk 150 feet activity   Assist Walk 150 feet activity did not occur: Safety/medical concerns  Walk 10 feet on uneven surface  activity   Assist Walk 10 feet on uneven surfaces activity did not occur: Safety/medical concerns         Wheelchair     Assist Will patient use wheelchair at discharge?: No             Wheelchair 50 feet with 2 turns activity    Assist            Wheelchair 150 feet activity     Assist          Blood pressure 115/73, pulse 73, temperature 97.8 F (36.6 C), temperature source Oral, resp. rate  18, height 5\' 10"  (1.778 m), weight 94.1 kg, SpO2 100 %.    Medical Problem List and Plan: 1.  Left hemiparesis secondary to multiple small acute infarcts Bilateral frontal lobe, right thalamic microhemorrhage after cardiac arrest with successful PCI             -patient may  Shower             -ELOS/Goals: 2 weeks -supervision therapy evals to day  2.  Antithrombotics: -DVT/anticoagulation: SCDs             -antiplatelet therapy: Aspirin 81 mg daily, Plavix 75 mg daily 3. Pain Management: Oxycodone as needed 4. Mood: Valproic acid 250 mg twice daily, Rozerem 8 mg nightly, melatonin 3 mg nightly as needed             -antipsychotic agents: Seroquel 50 mg nightly 5. Neuropsych: This patient is not capable of making decisions on his own behalf. 6. Skin/Wound Care: Routine skin checks 7. Fluids/Electrolytes/Nutrition: Routine in and outs with follow-up chemistries 8.  Acute systolic congestive heart failure.  Continue low-dose Entresto 24-26 mg twice daily, Aldactone 25 mg daily and Coreg 3.125 mg twice daily CBG (last 3)  Recent Labs    03/01/20 0002 03/01/20 0437 03/01/20 0653  GLUCAP 117* 132* 130*    9.  Ileus.  Dysphagia-  Cortrak placed 02/21/2020 hoping to remove soon.  Diet slowly advanced. Get Calorie count x 24 hours and see if can remove Cortrak. Ate 100% meal x 1 , fluid intake is low  10.  Diabetes mellitus.  Hemoglobin A1c 6.0.  Currently on Levemir 5 units daily.  Question change to Jardiance at discharge. CBG (last 3)  Recent Labs    03/01/20 0002 03/01/20 0437 03/01/20 0653  GLUCAP 117* 132* 130*    LOS: 2 days A FACE TO FACE EVALUATION WAS PERFORMED  03/03/20 03/01/2020, 7:39 AM

## 2020-03-01 NOTE — Progress Notes (Signed)
Speech Language Pathology Daily Session Note  Patient Details  Name: Jeffrey Morse MRN: [id number] Date of Birth: July 26, 1955  Today's Date: 03/01/2020 SLP Individual Time: 1400-1455 SLP Individual Time Calculation (min): 55 min  Short Term Goals: Week 1: SLP Short Term Goal 1 (Week 1): Pt will consume current diet with Supervision A verbal cueing for use of compensatory swallowing strategies. SLP Short Term Goal 2 (Week 1): Pt will consume trials of regular texture solids at least X2 with efficient mastication, oral clearance, and Supervision A for use of swallow strategies prior to upgrade. SLP Short Term Goal 3 (Week 1): Pt will demontrate ability to problem solve functional semi-complex to complex problem solving with Min A verbal and visual cues. SLP Short Term Goal 4 (Week 1): Pt will identify 2 impairments in setting acute CVA (1 physical and 1 cognitive) provided Min A verbal/visual cues. SLP Short Term Goal 5 (Week 1): Pt will demonstrate ability to recall new and functional daily information with Min A cues for use of compensatory strategies.  Skilled Therapeutic Interventions: Pt was seen for skilled ST targeting cognitive skills. Pt required overall Min A verbal and visual cues for problem solving and error awareness during a basic medication management task in which he interpreted medication labels to answer questions and organize a pill chart. He also required Min A verbal cues for redirection to maintain topics of conversation. He has questions about his course of hospitalization, which SLP provided Min A cues for recall of. He demonstrated good recall of earlier PT session and need to remain in bed due to low blood pressure (without cueing). Pt left laying in bed with alarm set and needs within reach. Continue per current plan of care.          Pain Pain Assessment Pain Scale: 0-10 Pain Score: 0-No pain  Therapy/Group: Individual Therapy  Little Ishikawa 03/01/2020, 7:22  AM

## 2020-03-01 NOTE — Progress Notes (Signed)
Orthopedic Tech Progress Note Patient Details:  Jeffrey Morse 10-18-1955 [id number] Dropped off at front desk Ortho Devices Type of Ortho Device: Abdominal binder Ortho Device/Splint Interventions: Ordered   Post Interventions Patient Tolerated: Other (comment) Instructions Provided: Other (comment)   [name] 03/01/2020, 11:30 AM

## 2020-03-01 NOTE — Progress Notes (Signed)
Physical Therapy Session Note  Patient Details  Name: Jeffrey Morse MRN: [id number] Date of Birth: 03/09/1955  Today's Date: 03/01/2020 PT Individual Time: 4401-0272 PT Individual Time Calculation (min): 55 min   Short Term Goals: Week 1:  PT Short Term Goal 1 (Week 1): Pt will perform supine<>sit with CGA PT Short Term Goal 2 (Week 1): Pt will perform sit<>stands using LRAD with CGA PT Short Term Goal 3 (Week 1): Pt will perform bed<>chair transfers using LRAD with CGA PT Short Term Goal 4 (Week 1): Pt will ambulate at least 164ft using LRAD with CGA PT Short Term Goal 5 (Week 1): Pt will ascend/descend 8 steps using 1 HR with CGA  Skilled Therapeutic Interventions/Progress Updates:    Pt greeted supine in bed, awake and agreeable to therapy. Reports mild chest pain which is related to hx of CPR. Educated on sternal bracing with pillow during episodes of coughing or sneezing for relief. Supine<>sit with CGA with bed features and able to sit EOB with SBA. Donned knee high TED's with totalA for time management. He was able to don shoes with setupA while seated EOB. Stand<>pivot with minA from EOB to w/c, towards R side, cues for safety approach and sequencing. W/c transport for time management to main therapy gym.   Performed stand<>pivot with minA and no AD from w/c to mat table, posterior bias noted and cues needed for step sequencing and safety awareness. Able to sit EOM with SBA without difficulty. Initiated BERG balance test as outlined below. Noted during the standing tasks that he had flushed appearance and reported lightheadedness. Pt requesting to lay down on the mat table to "stretch his legs." BP assessed once he layed down, reading 85/61 (70) HR 66. Attempted to retrieve thigh-high TED's and abdominal binder but unable to locate in reasonable time. Performed a few simple supine there-ex with goals of improving BP, including heel slides, ankle pumps, and SAQ. BP unchanged after,  reading 85/59. Supine<>sit with minA from mat table and BP assessed sitting, reading 77/52 (60) HR 72. Stand<>pivot with minA back to his w/c and he was returned to his room where he performed an additional stand<>pivot with minA back to bed. Sit>supine with supervision. He remained supine in bed at end of session with needs in reach and bed alarm on. RN notified after session regarding hypotension; RN reports she will order TED's and abdominal binder.   Therapy Documentation Precautions:  Precautions Precautions: Fall,Other (comment) Precaution Comments: monitor for orthostatic hypotension Restrictions Weight Bearing Restrictions: No RLE Weight Bearing: Weight bearing as tolerated   Balance: Balance Balance Assessed: Yes Standardized Balance Assessment Standardized Balance Assessment: Berg Balance Test Berg Balance Test Sit to Stand: Needs minimal aid to stand or to stabilize Standing Unsupported: Able to stand 2 minutes with supervision Sitting with Back Unsupported but Feet Supported on Floor or Stool: Able to sit safely and securely 2 minutes Stand to Sit: Uses backs of legs against chair to control descent Transfers: Able to transfer safely, definite need of hands Standing Unsupported with Eyes Closed: Able to stand 10 seconds with supervision Standing Ubsupported with Feet Together: Able to place feet together independently but unable to hold for 30 seconds  Therapy/Group: Individual Therapy  Rylann Munford P Kahlia Lagunes PT 03/01/2020, 9:36 AM

## 2020-03-01 NOTE — Progress Notes (Signed)
Therapist came to nurse stating that pt was hypotensive and slightly lightheaded during therapy session, session was not able to be completed d/t this episode. Pt assessed by nurse, pt alert and oriented x4, denies lightheadedness at this time, manual BP assessed as 98/60, apical HR 58, no other s/sx noted at this time. PA Dan notified of pt episode and status. Thigh high TED hose applied and abd binder ordered to be used during therapy.

## 2020-03-02 LAB — GLUCOSE, CAPILLARY
Glucose-Capillary: 102 mg/dL — ABNORMAL HIGH (ref 70–99)
Glucose-Capillary: 113 mg/dL — ABNORMAL HIGH (ref 70–99)
Glucose-Capillary: 117 mg/dL — ABNORMAL HIGH (ref 70–99)
Glucose-Capillary: 117 mg/dL — ABNORMAL HIGH (ref 70–99)
Glucose-Capillary: 168 mg/dL — ABNORMAL HIGH (ref 70–99)
Glucose-Capillary: 86 mg/dL (ref 70–99)

## 2020-03-02 MED ORDER — SPIRONOLACTONE 12.5 MG HALF TABLET
12.5000 mg | ORAL_TABLET | Freq: Every day | ORAL | Status: DC
  Administered 2020-03-03 – 2020-03-09 (×7): 12.5 mg via ORAL
  Filled 2020-03-02 (×7): qty 1

## 2020-03-02 NOTE — Progress Notes (Signed)
[place] PHYSICAL MEDICINE & REHABILITATION PROGRESS NOTE   Subjective/Complaints:  Pt states that BP drops but pt asymptomatic   ROS- neg CP, SOB,N/V/D  Objective:   No results found. Recent Labs    02/29/20 0532  WBC 9.1  HGB 11.0*  HCT 33.9*  PLT 808*   Recent Labs    02/29/20 0532  NA 133*  K 4.3  CL 100  CO2 22  GLUCOSE 153*  BUN 30*  CREATININE 1.05  CALCIUM 9.4    Intake/Output Summary (Last 24 hours) at 03/02/2020 0738 Last data filed at 03/02/2020 0420 Gross per 24 hour  Intake 480 ml  Output 450 ml  Net 30 ml        Physical Exam: Vital Signs Blood pressure 104/71, pulse 66, temperature 97.8 F (36.6 C), temperature source Oral, resp. rate 16, height 5\' 10"  (1.778 m), weight 94.1 kg, SpO2 99 %.   General: No acute distress Mood and affect are appropriate Heart: Regular rate and rhythm no rubs murmurs or extra sounds Lungs: Clear to auscultation, breathing unlabored, no rales or wheezes Abdomen: Positive bowel sounds, soft nontender to palpation, nondistended Extremities: No clubbing, cyanosis, or edema Skin: No evidence of breakdown, no evidence of rash    Sensory exam normal sensation to light touch and proprioception in bilateral upper and lower extremities  Musculoskeletal: Full range of motion in all 4 extremities. No joint swelling    Assessment/Plan: 1. Functional deficits which require 3+ hours per day of interdisciplinary therapy in a comprehensive inpatient rehab setting.  Physiatrist is providing close team supervision and 24 hour management of active medical problems listed below.  Physiatrist and rehab team continue to assess barriers to discharge/monitor patient progress toward functional and medical goals  Care Tool:  Bathing    Body parts bathed by patient: Right arm,Left arm,Chest,Abdomen,Front perineal area,Buttocks,Right upper leg,Left upper leg,Right lower leg,Left lower leg,Face         Bathing assist  Assist Level: Contact Guard/Touching assist (sitting with lateral leans)     Upper Body Dressing/Undressing Upper body dressing   What is the patient wearing?: Pull over shirt    Upper body assist Assist Level: Supervision/Verbal cueing    Lower Body Dressing/Undressing Lower body dressing      What is the patient wearing?: Underwear/pull up,Pants     Lower body assist Assist for lower body dressing: Minimal Assistance - Patient > 75%     Toileting Toileting    Toileting assist Assist for toileting: Contact Guard/Touching assist     Transfers Chair/bed transfer  Transfers assist     Chair/bed transfer assist level: Minimal Assistance - Patient > 75%     Locomotion Ambulation   Ambulation assist      Assist level: Minimal Assistance - Patient > 75% Assistive device: Hand held assist Max distance: 12'   Walk 10 feet activity   Assist     Assist level: Minimal Assistance - Patient > 75% Assistive device: Hand held assist (L UE around therapist's shoulders)   Walk 50 feet activity   Assist    Assist level: Moderate Assistance - Patient - 50 - 74% Assistive device: Hand held assist (L UE around therapist's shoulders)    Walk 150 feet activity   Assist Walk 150 feet activity did not occur: Safety/medical concerns         Walk 10 feet on uneven surface  activity   Assist Walk 10 feet on uneven surfaces activity did not occur: Safety/medical concerns  Wheelchair     Assist Will patient use wheelchair at discharge?: No             Wheelchair 50 feet with 2 turns activity    Assist            Wheelchair 150 feet activity     Assist          Blood pressure 104/71, pulse 66, temperature 97.8 F (36.6 C), temperature source Oral, resp. rate 16, height 5\' 10"  (1.778 m), weight 94.1 kg, SpO2 99 %.    Medical Problem List and Plan: 1.  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  Antithrombotics: -DVT/anticoagulation: SCDs             -antiplatelet therapy: Aspirin 81 mg daily, Plavix 75 mg daily 3. Pain Management: Oxycodone as needed 4. Mood: Valproic acid 250 mg twice daily, Rozerem 8 mg nightly, melatonin 3 mg nightly as needed             -antipsychotic agents: Seroquel 50 mg nightly 5. Neuropsych: This patient is not capable of making decisions on his own behalf. 6. Skin/Wound Care: Routine skin checks 7. Fluids/Electrolytes/Nutrition: Routine in and outs with follow-up chemistries 8.  Acute systolic congestive heart failure.  Continue low-dose Entresto 24-26 mg twice daily, Aldactone 25 mg daily and Coreg 3.125 mg twice daily    9.  Ileus.  Dysphagia-  Cortrak placed 02/21/2020 hoping to remove soon.  Diet slowly advanced. Get Calorie count x 24 hours and see if can remove Cortrak. Ate 100% meal x 1 , fluid intake is low  10.  Diabetes mellitus.  Hemoglobin A1c 6.0.  Currently on Levemir 5 units daily.  Question change to Jardiance at discharge. CBG (last 3)  Recent Labs    03/01/20 2015 03/02/20 0017 03/02/20 0416  GLUCAP 143* 117* 102*    LOS: 3 days A FACE TO FACE EVALUATION WAS PERFORMED  03/04/20 03/02/2020, 7:38 AM

## 2020-03-02 NOTE — Progress Notes (Signed)
Occupational Therapy Session Note  Patient Details  Name: Jeffrey Morse MRN: [id number] Date of Birth: 1955-08-19  Today's Date: 03/02/2020 OT Individual Time: 7035-0093 OT Individual Time Calculation (min): 40 min    Short Term Goals: Week 1:  OT Short Term Goal 1 (Week 1): Pt will demonstrate improved activity tolerance to be able to complete a shower without a drop in blood pressure. OT Short Term Goal 2 (Week 1): Pt will be able to don a shirt with no cues demonstrating improved cognition. OT Short Term Goal 3 (Week 1): Pt will be able to don pants with CGA. OT Short Term Goal 4 (Week 1): Pt will demonstrate improved standing balance and awareness of L foot placement during toileting tasks with CGA.  Skilled Therapeutic Interventions/Progress Updates:    Pt in bed to start session with spouse present.  BP taken in sitting at 136/77 with thigh high TEDs and abdominal binder.  No reports of dizziness while sitting or with standing.  He was able to ambulate to the ortho gym with use of the RW and supervision.  He was able to complete use of the UE ergonometer for LUE strengthening.  He completed overall 15 mins with the first 5 mins being completed using BUEs with resistance on level 10.  He was able to maintain his RPMs at 24-26 on [facility].  After a rest break, he was able to isolate the LUE for completion of 3-4 mins before needing a rest break secondary to fatigue.  Finished the last 5-6 mins with use of BUEs with supervision.  After completion of exercise, had him perform [place] Peg test.  He was able to complete this with the RUE in 28 seconds and the impaired LUE in 36 seconds.  When given 3 words to recall during UE ergonometer use, he was only able to recall 1/3 after 3-4 min delay.  Finished session with return to the room with use of the RW for support and close supervision.  He was able to transfer back to the bed with supervision to finish session.  Call button and  phone in reach and safety alarm in place.  Pt's spouse present in the room as well.    Therapy Documentation Precautions:  Precautions Precautions: Fall,Other (comment) Precaution Comments: monitor for orthostatic hypotension Restrictions Weight Bearing Restrictions: No RLE Weight Bearing: Weight bearing as tolerated   Pain: Pain Assessment Pain Scale: Faces Pain Score: 0-No pain ADL: See Care Tool Section for some details of mobility and selfcare  Therapy/Group: Individual Therapy  Vartan Kerins OTR/L 03/02/2020, 3:45 PM

## 2020-03-02 NOTE — Progress Notes (Addendum)
Advanced Heart Failure Rounding Note  PCP:  Primary Cardiologist:   Subjective:    1/12 admitted for Vfib arrest, cath PCI/DES and impella 1/13 Echo EF ~50-55% LV small RV ok. 1/14 VT arrest, ROSC after 3 minutes.  1/15 Impella removed 1/16 Left sided weakness. MRI with multiple small infarcts and small ICH 1/18 Extubated.  1/22 Developed hyperactive delirium at night, head CT neg, started on valproate and seroquel  1/25 Admitted to CIR cortrak removed  He reports doing very well in CIR.  Sleeping better, appetite has improved.  Working with PT.  He has noticed improvement in left sided weakness and especially coordination.  Denies any chest pain or dyspnea, orthopnea or PND.  There is a report of some low bp during PT, patient asymptomatic they are performing orthostatic vital signs as I am leaving.     Objective:   Weight Range: 94.1 kg Body mass index is 29.77 kg/m.   Vital Signs:   Temp:  [97.7 F (36.5 C)-98 F (36.7 C)] 97.8 F (36.6 C) (01/28 0417) Pulse Rate:  [59-82] 62 (01/28 0750) Resp:  [16-19] 16 (01/28 0417) BP: (90-122)/(63-86) 90/65 (01/28 1002) SpO2:  [96 %-99 %] 99 % (01/28 0417) Last BM Date: 03/01/20  Weight change: Filed Weights   02/28/20 1500  Weight: 94.1 kg    Intake/Output:   Intake/Output Summary (Last 24 hours) at 03/02/2020 1015 Last data filed at 03/02/2020 0420 Gross per 24 hour  Intake 240 ml  Output 450 ml  Net -210 ml      Physical Exam    General:  Well appearing. No resp difficulty HEENT: Normal Neck: Supple. JVP . Carotids 2+ bilat; no bruits. No lymphadenopathy or thyromegaly appreciated. Cor: PMI nondisplaced. Regular rate & rhythm. No rubs, gallops or murmurs. Lungs: Clear Abdomen: Soft, nontender, nondistended. No hepatosplenomegaly. No bruits or masses. Good bowel sounds. Extremities: No cyanosis, clubbing, rash, edema Neuro: Alert & orientedx3, cranial nerves grossly intact. moves all 4 extremities w/o  difficulty. Affect pleasant   Telemetry   n/a  EKG    n/a  Labs    CBC Recent Labs    02/29/20 0532  WBC 9.1  NEUTROABS 5.3  HGB 11.0*  HCT 33.9*  MCV 88.7  PLT 808*   Basic Metabolic Panel Recent Labs    27/25/36 0532  NA 133*  K 4.3  CL 100  CO2 22  GLUCOSE 153*  BUN 30*  CREATININE 1.05  CALCIUM 9.4   Liver Function Tests Recent Labs    02/29/20 0532  AST 24  ALT 56*  ALKPHOS 153*  BILITOT 1.0  PROT 7.3  ALBUMIN 2.9*   No results for input(s): LIPASE, AMYLASE in the last 72 hours. Cardiac Enzymes No results for input(s): CKTOTAL, CKMB, CKMBINDEX, TROPONINI in the last 72 hours.  BNP: BNP (last 3 results) No results for input(s): BNP in the last 8760 hours.  ProBNP (last 3 results) No results for input(s): PROBNP in the last 8760 hours.   D-Dimer No results for input(s): DDIMER in the last 72 hours. Hemoglobin A1C No results for input(s): HGBA1C in the last 72 hours. Fasting Lipid Panel No results for input(s): CHOL, HDL, LDLCALC, TRIG, CHOLHDL, LDLDIRECT in the last 72 hours. Thyroid Function Tests No results for input(s): TSH, T4TOTAL, T3FREE, THYROIDAB in the last 72 hours.  Invalid input(s): FREET3  Other results:   Imaging     No results found.   Medications:     Scheduled Medications: .  aspirin  81 mg Oral Daily  . carvedilol  3.125 mg Per Tube BID WC  . clopidogrel  75 mg Per Tube Daily  . empagliflozin  10 mg Oral Daily  . escitalopram  20 mg Oral Daily  . feeding supplement  237 mL Oral BID BM  . insulin aspart  0-15 Units Subcutaneous TID AC & HS  . insulin glargine  9 Units Subcutaneous QHS  . multivitamin with minerals  1 tablet Oral Daily  . nystatin  5 mL Mouth/Throat QID  . pantoprazole sodium  40 mg Per Tube Daily  . QUEtiapine  50 mg Oral QHS  . ramelteon  8 mg Oral QHS  . rosuvastatin  40 mg Per Tube Daily  . sacubitril-valsartan  1 tablet Per Tube BID  . spironolactone  25 mg Per Tube Daily  .  valproic acid  250 mg Per Tube BID     Infusions:   PRN Medications:  acetaminophen **OR** acetaminophen (TYLENOL) oral liquid 160 mg/5 mL **OR** acetaminophen, melatonin, Muscle Rub     Assessment/Plan   1. Cardiac (VTarrest in setting of acute inferolateral STEMI/VT - OOH cardiac arrest on 1/12 with prolonged CPR, resuscitated w/ CPR + defibrillation  - Emergent cath 1/12 w/ totally occluded mid LCx treated w/ PCI + DES - Recurrent VT on 1/14 -> defib x 1 - Off amiodarone  - Supp K PRN - Mag stable.  - continue low dose carvedilol  2. Acute systolic HF due in setting of acute MI -> cardiogenic shock - EF 25% in cath lab 1/12 - Echo 1/13 EF 50-55% - Impella pulled 1/14, CVP 5-7 CO-OX remains stable. - Renal function stable.  - Continue low dose entresto, low dose carvedilol, jardiance, decrease spironolactone to 12.5mg  with lower bp's and slightly positive orthostatics (may be able to go back up when fluid intake improves),  no need for diuretic  3. CAD with acute inferolateral  STEMI  - Emergent cath 1/12 w/ totally occluded mid LCx treated w/ PCI + DES -There is moderate ostial LAD disease and significant disease in the distal LAD close to the apex. No significant disease affecting the right coronary artery. - Continue DAPT. Restarted statin - Brilinta switched to Plavix; Plavix therapeutic per P2y12 lab.  - continue low dose carvedilol  4. Acute CVA with ICH - left-sided weakness --> now not following commands; repeat CT shows stable hemorrhage - MRI with multiple small infarcts and small ICH (Likely cardioembolic from arrest) - Brilinta switched to Plavix -L sided weakness and coordination continues to improve, continue excellent work with CIR  5. Hyperactive Delirium -began on 1/22 at night -he was started on seroquel and valproic acid, seroquel increased 1/24 -sleeping better, no further episodes reported   6. DM2 - HgbA1c 6.0 - now well controlled  on jardiance and basal bolus insulin   Length of Stay: 3   Angelita Ingles, MD  03/02/2020, 10:15 AM  Advanced Heart Failure Team Pager [phone] (M-F; 7a - 4p)  Please contact [facility] for night-coverage after hours (4p -7a ) and weekends on [email or web address]  Patient seen and examined with the above-signed Advanced Practice Provider and/or Housestaff. I personally reviewed laboratory data, imaging studies and relevant notes. I independently examined the patient and formulated the important aspects of the plan. I have edited the note to reflect any of my changes or salient points. I have personally discussed the plan with the patient and/or family.  Making good progress with CIR. No CP  or SOB. BP running low.   General:  Well appearing. No resp difficulty HEENT: normal Neck: supple. no JVD. Carotids 2+ bilat; no bruits. No lymphadenopathy or thryomegaly appreciated. Cor: PMI nondisplaced. Regular rate & rhythm. No rubs, gallops or murmurs. Lungs: clear Abdomen: soft, nontender, nondistended. No hepatosplenomegaly. No bruits or masses. Good bowel sounds. Extremities: no cyanosis, clubbing, rash, edema Neuro: alert & orientedx3, cranial nerves grossly intact. moves all 4 extremities w/o difficulty. Affect pleasant  Progressing well. BP is soft. He is not on a diuretic. Encourage po intake. Liberalize salt. If BP continue to run low can stop [place] and reassess. I will see again on Monday. Appreciate CIR care.   Arvilla Meres, MD  5:58 PM

## 2020-03-02 NOTE — Progress Notes (Signed)
Physical Therapy Session Note  Patient Details  Name: Jeffrey Morse MRN: [id number] Date of Birth: [birth year]  Today's Date: 03/02/2020 PT Individual Time: 1100-1155; 1300-1340 PT Individual Time Calculation (min): 55 min and 40 mins  Short Term Goals: Week 1:  PT Short Term Goal 1 (Week 1): Pt will perform supine<>sit with CGA PT Short Term Goal 2 (Week 1): Pt will perform sit<>stands using LRAD with CGA PT Short Term Goal 3 (Week 1): Pt will perform bed<>chair transfers using LRAD with CGA PT Short Term Goal 4 (Week 1): Pt will ambulate at least 134ft using LRAD with CGA PT Short Term Goal 5 (Week 1): Pt will ascend/descend 8 steps using 1 HR with CGA  Skilled Therapeutic Interventions/Progress Updates:    Session 1: Patient received supine in bed, agreeable to PT. He denies pain when asked, but with mobility reports intermittent R knee pain related to previous sxs. PT providing rest breaks, repositioning to assist with pain management. Patient requesting to sit edge of bed to eat cereal at beginning of session. PT donning thigh-high TED hose TotalA and abd binder. He was able to come sit edge of bed with supervision and maintained adequate dynamic sitting balance to eat cereal with set up assist. Patient transferring to wc via stand pivot with CGA and PT propelling patient to gym for time management and energy conservation. In sitting, BP: 102/67. Patient able to come to standing with supervision and RW. BP in standing: 90/64 with no report of dizziness. Patient remaining standing for 2' with BP reading: 87/58 with mild symptoms requesting to sit. Patient completing berg balance scale with minimal reports of symptoms and intermittent sitting, scoring a 37/56, indicating increased risk for falling and benefit from use of AD. Patient verbalizing understanding and agreement. He was able to ambulate from gym to room ~133ft with RW and CGA. Patient with lateral path deviation intermittently, but  demonstrated safe use of AD. Patient requesting to return to bed, bed alarm on, call light within reach, wife at bedside.    Session 2: Patient received supine in bed, agreeable to PT. He denies pain. Patient able to come sit edge of bed with supervision. PT donning abd binder in sitting. Transferring to wc via stand pivot and supervision. PT propelling patient in wc to therapy gym for time management and energy conservation. Patient completing visual scanning tasks on the BITS in stand with U UE support and close supervision. Able to reach outside BOS and no evidence of LOB. He was able to remain standing ~2 mins before becoming symptomatic and needing to sit down. Patient with significant difficulty completing cog sequencing task. Becoming frustrated stating "I was never good at remembering numbers." PT had patient sequencing letters. Patient with increased ability to complete bell cancellation task with Min verbal cues for completion. Increased difficulty with complex trail making task noted, however. Patient able to remember proper sequence needed to complete trail accurately for ~2 segments, then would forget and require cuing from PT. Patient ambulating back to room with RW and CGA. Returning to bed, bed alarm on, call light within reach.   Therapy Documentation Precautions:  Precautions Precautions: Fall,Other (comment) Precaution Comments: monitor for orthostatic hypotension Restrictions Weight Bearing Restrictions: No RLE Weight Bearing: Weight bearing as tolerated    Therapy/Group: Individual Therapy  [name], PT, DPT, CBIS  03/02/2020, 7:48 AM

## 2020-03-02 NOTE — IPOC Note (Signed)
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 (HCC)     Functional Problem List: Nursing Bladder,Endurance,Nutrition,Medication Management,Safety  PT Balance,Motor,Safety,Behavior,Sensory,Pain,Endurance,Perception  OT Balance,Cognition,Endurance,Motor,Perception,Safety  SLP Nutrition,Cognition  TR         Basic ADL's: OT Eating,Grooming,Bathing,Dressing,Toileting     Advanced  ADL's: OT       Transfers: PT Bed Mobility,Furniture,Bed to Chair,Floor,Car  OT Toilet,Tub/Shower     Locomotion: PT Ambulation,Stairs     Additional Impairments: OT None  SLP Swallowing,Social Cognition   Problem Solving,Memory,Awareness,Attention  TR      Anticipated Outcomes Item Anticipated Outcome  Self Feeding independent  Swallowing  Mod I   Basic self-care  Supervision  Toileting  supervision   Bathroom Transfers supervision  Bowel/Bladder  Bladder management with mod I assist  Transfers  supervision using LRAD  Locomotion  supervision using LRAD  Communication     Cognition  Supervision  Pain     Safety/Judgment  Maintain safety withcues/reminders   Therapy Plan: PT Intensity: Minimum of 1-2 x/day ,45 to 90 minutes PT Frequency: 5 out of 7 days PT Duration Estimated Length of Stay: ~ 2 weeks OT Intensity: Minimum of 1-2 x/day, 45 to 90 minutes OT Frequency: 5 out of 7 days OT Duration/Estimated Length of Stay: 12-14 days SLP Intensity: Minumum of 1-2 x/day, 30 to 90 minutes SLP Frequency: 3 to 5 out of 7 days SLP Duration/Estimated Length of Stay: 2 weeks   Due to the current state of emergency, patients may not be receiving their 3-hours of Medicare-mandated therapy.   Team Interventions: Nursing Interventions Patient/Family  Education,Bladder Management,Disease Management/Prevention,Medication Scientist, research (medical)  PT interventions Ambulation/gait training,Balance/vestibular training,Cognitive remediation/compensation,Community reintegration,Discharge planning,Disease Engineer, production stimulation,Functional mobility training,Neuromuscular re-education,Pain management,Patient/family education,Psychosocial support,Skin care/wound management,Splinting/orthotics,Stair training,Therapeutic Activities,Therapeutic Exercise,UE/LE Strength taining/ROM,UE/LE Coordination activities,Visual/perceptual remediation/compensation  OT Interventions Balance/vestibular training,Cognitive remediation/compensation,Discharge planning,DME/adaptive equipment instruction,Functional mobility training,Psychosocial support,Patient/family education,Pain management,Neuromuscular re-education,Self Care/advanced ADL retraining,Therapeutic Activities,Therapeutic Exercise,UE/LE Strength taining/ROM,UE/LE Coordination activities,Visual/perceptual remediation/compensation  SLP Interventions Cognitive remediation/compensation,Cueing hierarchy,Dysphagia/aspiration precaution training,Functional tasks,Patient/family education,Internal/external aids  TR Interventions    SW/CM Interventions Discharge Planning,Psychosocial Support,Patient/Family Education,Disease Management/Prevention   Barriers to Discharge MD  Medical stability  Nursing Decreased caregiver support    PT Home environment access/layout    OT      SLP      SW       Team Discharge Planning: Destination: PT-Home ,OT- Home , SLP-Home Projected Follow-up: PT-Outpatient PT,24 hour supervision/assistance, OT-  Outpatient OT, SLP-24 hour supervision/assistance,Outpatient SLP Projected Equipment Needs: PT-To be determined, OT- Tub/shower bench, SLP-None recommended by SLP Equipment  Details: PT- , OT-  Patient/family involved in discharge planning: PT- Patient,Family member/caregiver,  OT-Patient,Family member/caregiver, SLP-Patient,Family member/caregiver  MD ELOS: 10-14d Medical Rehab Prognosis:  Good Assessment:   65 year old right-handed male with history of hypertension as well as hyperlipidemia.  Per chart review lives with spouse independent prior to admission working full-time.  1 level home 3 steps to entry.  Presented 02/15/2020 with chest pain and went to the urgent care where he collapsed and CPR was initiated.  He was in ventricular fibrillation and was shocked successfully.  CPR was done for approximately 15 minutes.  While in the ambulance he lost pulse again went to PEA arrest CPR resumed.  He did require intubation.  Patient was hypotensive but improved with epinephrine.  Echocardiogram  revealed severely reduced LV systolic function no evidence of pericardial effusion.  EKG after CPR showed evidence of inferior ST elevation with lateral involvement suggestive of left circumflex occlusion.  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  Recurrent Vt after cardiac cath defibrillated x1 maintained on amiodarone.  Neurology consulted 02/17/2020 for left-sided weakness.  EEG negative.  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  New right thalamic microhemorrhage as compared to scan from 01/18/2012.  MRA was unremarkable.  Currently maintained on aspirin and Plavix for CVA prophylaxis.  Hospital course ileus and cortrak placed 02/21/2020 diet slowly advanced dysphagia #2 thin liquids.  Findings of elevated hemoglobin A1c 6.0 currently maintained on Levemir.  Bouts of agitation and restlessness maintained on Seroquel as well as valproic acid.  Patient did have persistent fevers there was some question related to right upper extremity thrombus versus pneumonia it did resolve after Precedex stop so felt to  be possibly drug fever.  Venous Doppler study was negative.   Therapy evaluations completed due to patient decreased functional mobility left side weakness he was admitted for a comprehensive rehab program.   Now requiring 24/7 Rehab RN,MD, as well as CIR level PT, OT and SLP.  Treatment team will focus on ADLs and mobility with goals set at Sup See Team Conference Notes for weekly updates to the plan of care

## 2020-03-02 NOTE — Progress Notes (Signed)
Speech Language Pathology Daily Session Note  Patient Details  Name: Jeffrey Morse MRN: [id number] Date of Birth: 06-12-1955  Today's Date: 03/02/2020 SLP Individual Time: 1400-1500 SLP Individual Time Calculation (min): 60 min  Short Term Goals: Week 1: SLP Short Term Goal 1 (Week 1): Pt will consume current diet with Supervision A verbal cueing for use of compensatory swallowing strategies. SLP Short Term Goal 2 (Week 1): Pt will consume trials of regular texture solids at least X2 with efficient mastication, oral clearance, and Supervision A for use of swallow strategies prior to upgrade. SLP Short Term Goal 3 (Week 1): Pt will demontrate ability to problem solve functional semi-complex to complex problem solving with Min A verbal and visual cues. SLP Short Term Goal 4 (Week 1): Pt will identify 2 impairments in setting acute CVA (1 physical and 1 cognitive) provided Min A verbal/visual cues. SLP Short Term Goal 5 (Week 1): Pt will demonstrate ability to recall new and functional daily information with Min A cues for use of compensatory strategies.  Skilled Therapeutic Interventions:   Patient seen for skilled ST session focusing on cognitive function goals with wife present for second half of session. Patient describing how he was frustrated when asked to do task during PT session that he felt he would have had a hard time with before. SLP spent time discussing with patient reasoning for working on cognitive tasks during physical activities/tasks in order to simulate instances requiring alternating attention, etc. When asked, patient did state that he needs to work on eating better and his general health. SLP discussed with patient and spouse plan to work on some cognitive tasks centered around these topics. Patient continues to benefit from skilled SLP intervention to maximize cognitive function prior to discharge.  Pain Pain Assessment Pain Scale: 0-10 Pain Score: 0-No  pain  Therapy/Group: Individual Therapy  Angela Nevin, MA, CCC-SLP Speech Therapy

## 2020-03-03 LAB — BASIC METABOLIC PANEL
Anion gap: 10 (ref 5–15)
BUN: 26 mg/dL — ABNORMAL HIGH (ref 8–23)
CO2: 21 mmol/L — ABNORMAL LOW (ref 22–32)
Calcium: 9.2 mg/dL (ref 8.9–10.3)
Chloride: 104 mmol/L (ref 98–111)
Creatinine, Ser: 1.02 mg/dL (ref 0.61–1.24)
GFR, Estimated: >60 mL/min (ref >60)
Glucose, Bld: 100 mg/dL — ABNORMAL HIGH (ref 70–99)
Potassium: 4.3 mmol/L (ref 3.5–5.1)
Sodium: 135 mmol/L (ref 135–145)

## 2020-03-03 LAB — GLUCOSE, CAPILLARY
Glucose-Capillary: 100 mg/dL — ABNORMAL HIGH (ref 70–99)
Glucose-Capillary: 102 mg/dL — ABNORMAL HIGH (ref 70–99)
Glucose-Capillary: 105 mg/dL — ABNORMAL HIGH (ref 70–99)
Glucose-Capillary: 171 mg/dL — ABNORMAL HIGH (ref 70–99)
Glucose-Capillary: 90 mg/dL (ref 70–99)

## 2020-03-03 MED ORDER — ACETAMINOPHEN 325 MG PO TABS
650.0000 mg | ORAL_TABLET | ORAL | Status: DC | PRN
  Administered 2020-03-07 – 2020-03-08 (×2): 650 mg via ORAL
  Filled 2020-03-03 (×2): qty 2

## 2020-03-03 MED ORDER — ROSUVASTATIN CALCIUM 20 MG PO TABS
40.0000 mg | ORAL_TABLET | Freq: Every day | ORAL | Status: DC
  Administered 2020-03-03 – 2020-03-09 (×7): 40 mg via ORAL
  Filled 2020-03-03 (×7): qty 2

## 2020-03-03 MED ORDER — ACETAMINOPHEN 160 MG/5ML PO SOLN: 650.0000 mg | ORAL | Status: DC | PRN

## 2020-03-03 MED ORDER — VALPROIC ACID 250 MG PO CAPS
250.0000 mg | ORAL_CAPSULE | Freq: Two times a day (BID) | ORAL | Status: DC
  Administered 2020-03-03 – 2020-03-07 (×8): 250 mg via ORAL
  Filled 2020-03-03 (×8): qty 1

## 2020-03-03 MED ORDER — MELATONIN 3 MG PO TABS
3.0000 mg | ORAL_TABLET | Freq: Every evening | ORAL | Status: DC | PRN
  Administered 2020-03-05 – 2020-03-06 (×2): 3 mg via ORAL
  Filled 2020-03-03 (×2): qty 1

## 2020-03-03 MED ORDER — ACETAMINOPHEN 650 MG RE SUPP: 650.0000 mg | RECTAL | Status: DC | PRN

## 2020-03-03 MED ORDER — SACUBITRIL-VALSARTAN 24-26 MG PO TABS
1.0000 | ORAL_TABLET | Freq: Two times a day (BID) | ORAL | Status: DC
  Administered 2020-03-03 – 2020-03-08 (×11): 1 via ORAL
  Filled 2020-03-03 (×13): qty 1

## 2020-03-03 MED ORDER — PANTOPRAZOLE SODIUM 40 MG PO TBEC
40.0000 mg | DELAYED_RELEASE_TABLET | Freq: Every day | ORAL | Status: DC
  Administered 2020-03-03 – 2020-03-09 (×7): 40 mg via ORAL
  Filled 2020-03-03 (×7): qty 1

## 2020-03-03 MED ORDER — QUETIAPINE FUMARATE 25 MG PO TABS
25.0000 mg | ORAL_TABLET | Freq: Every day | ORAL | Status: DC
  Administered 2020-03-03 – 2020-03-04 (×2): 25 mg via ORAL
  Filled 2020-03-03 (×2): qty 1

## 2020-03-03 MED ORDER — CARVEDILOL 3.125 MG PO TABS
3.1250 mg | ORAL_TABLET | Freq: Two times a day (BID) | ORAL | Status: DC
  Administered 2020-03-03: 3.125 mg via ORAL
  Filled 2020-03-03: qty 1

## 2020-03-03 MED ORDER — PANTOPRAZOLE SODIUM 40 MG PO PACK: 40.0000 mg | PACK | Freq: Every day | ORAL | Status: DC

## 2020-03-03 MED ORDER — CLOPIDOGREL BISULFATE 75 MG PO TABS
75.0000 mg | ORAL_TABLET | Freq: Every day | ORAL | Status: DC
  Administered 2020-03-03 – 2020-03-09 (×7): 75 mg via ORAL
  Filled 2020-03-03 (×7): qty 1

## 2020-03-03 MED ORDER — VALPROIC ACID 250 MG/5ML PO SOLN
250.0000 mg | Freq: Two times a day (BID) | ORAL | Status: DC
  Administered 2020-03-03: 250 mg via ORAL
  Filled 2020-03-03: qty 5

## 2020-03-03 NOTE — Progress Notes (Signed)
Speech Language Pathology Daily Session Note  Patient Details  Name: Jeffrey Morse MRN: [id number] Date of Birth: [birth year]  Today's Date: 03/03/2020 SLP Individual Time: 0730-0830 SLP Individual Time Calculation (min): 60 min  Short Term Goals: Week 1: SLP Short Term Goal 1 (Week 1): Pt will consume current diet with Supervision A verbal cueing for use of compensatory swallowing strategies. SLP Short Term Goal 2 (Week 1): Pt will consume trials of regular texture solids at least X2 with efficient mastication, oral clearance, and Supervision A for use of swallow strategies prior to upgrade. SLP Short Term Goal 3 (Week 1): Pt will demontrate ability to problem solve functional semi-complex to complex problem solving with Min A verbal and visual cues. SLP Short Term Goal 4 (Week 1): Pt will identify 2 impairments in setting acute CVA (1 physical and 1 cognitive) provided Min A verbal/visual cues. SLP Short Term Goal 5 (Week 1): Pt will demonstrate ability to recall new and functional daily information with Min A cues for use of compensatory strategies.  Skilled Therapeutic Interventions: Skilled SLP intervention focused on cognition and dysphagia. Pt sleeping when SLP entered room but able to wake up and participate in 60 min session. Pt provided with written list of medications for stroke prevention, hypertension, blood glucose levels, and mood. He was able to recognize one medication by name that he took prior to hospitalization and required mod A after a delay to recall purpose of medications after being reviewed earlier in session. Pt completed problem solving time calculation task using movie theatre schedule/chart. He required moderate assistance and increased explanations of chart layout to increase understanding of written information. Pt expressed concerns with having to stay in rehab longer due to memory. SLP provided education on typical length of stay and plan with determining a  discharge date. Pt verbalized understanding. Pt demonstrated good awareness of "system" he used prior to  hospitalization for recall of information related to his profession. Pt consumed Dys 3 breakfast tray at end of session. He tolerated well with adequate mastication and no overt s/sx of aspiration or penetration.Cont with therapy per plan of care.      Pain Pain Assessment Pain Scale: Faces Faces Pain Scale: No hurt  Therapy/Group: Individual Therapy  Amil Amen A Bertram Haddix 03/03/2020, 8:16 AM

## 2020-03-03 NOTE — Progress Notes (Signed)
[place] PHYSICAL MEDICINE & REHABILITATION PROGRESS NOTE   Subjective/Complaints: No dizziness with standing.  PT notes blood pressure drops.  Spoke with cardiology Dr. Margurite Auerbach who recommended stopping Coreg.  ROS- neg CP, SOB,N/V/D  Objective:   No results found. No results for input(s): WBC, HGB, HCT, PLT in the last 72 hours. Recent Labs    03/03/20 0632  NA 135  K 4.3  CL 104  CO2 21*  GLUCOSE 100*  BUN 26*  CREATININE 1.02  CALCIUM 9.2    Intake/Output Summary (Last 24 hours) at 03/03/2020 1416 Last data filed at 03/03/2020 0850 Gross per 24 hour  Intake 642 ml  Output 600 ml  Net 42 ml        Physical Exam: Vital Signs Blood pressure 101/64, pulse 73, temperature 98.2 F (36.8 C), temperature source Oral, resp. rate 18, height 5\' 10"  (1.778 m), weight 94.1 kg, SpO2 99 %.    General: No acute distress Mood and affect are appropriate Heart: Regular rate and rhythm no rubs murmurs or extra sounds Lungs: Clear to auscultation, breathing unlabored, no rales or wheezes Abdomen: Positive bowel sounds, soft nontender to palpation, nondistended Extremities: No clubbing, cyanosis, or edema Skin: No evidence of breakdown, no evidence of rash.  Occipital decubitus with scab nontender palpation has some numbness around it.  No erythema. Neurologic: Cranial nerves II through XII intact, motor strength is 5/5 in bilateral deltoid, bicep, tricep, grip, hip flexor, knee extensors, ankle dorsiflexor and plantar flexor      Sensory exam normal sensation to light touch and proprioception in bilateral upper and lower extremities  Musculoskeletal: Full range of motion in all 4 extremities. No joint swelling    Assessment/Plan: 1. Functional deficits which require 3+ hours per day of interdisciplinary therapy in a comprehensive inpatient rehab setting.  Physiatrist is providing close team supervision and 24 hour management of active medical problems listed  below.  Physiatrist and rehab team continue to assess barriers to discharge/monitor patient progress toward functional and medical goals  Care Tool:  Bathing    Body parts bathed by patient: Right arm,Left arm,Chest,Abdomen,Front perineal area,Buttocks,Right upper leg,Left upper leg,Right lower leg,Left lower leg,Face         Bathing assist Assist Level: Supervision/Verbal cueing     Upper Body Dressing/Undressing Upper body dressing   What is the patient wearing?: Pull over shirt    Upper body assist Assist Level: Supervision/Verbal cueing    Lower Body Dressing/Undressing Lower body dressing      What is the patient wearing?: Underwear/pull up,Pants     Lower body assist Assist for lower body dressing: Contact Guard/Touching assist     Toileting Toileting    Toileting assist Assist for toileting: Contact Guard/Touching assist     Transfers Chair/bed transfer  Transfers assist     Chair/bed transfer assist level: Supervision/Verbal cueing     Locomotion Ambulation   Ambulation assist      Assist level: Contact Guard/Touching assist Assistive device: Walker-rolling Max distance: 150   Walk 10 feet activity   Assist     Assist level: Contact Guard/Touching assist Assistive device: Walker-rolling   Walk 50 feet activity   Assist    Assist level: Contact Guard/Touching assist Assistive device: Walker-rolling    Walk 150 feet activity   Assist Walk 150 feet activity did not occur: Safety/medical concerns  Assist level: Contact Guard/Touching assist Assistive device: Walker-rolling    Walk 10 feet on uneven surface  activity   Assist Walk  10 feet on uneven surfaces activity did not occur: Safety/medical concerns         Wheelchair     Assist Will patient use wheelchair at discharge?: No             Wheelchair 50 feet with 2 turns activity    Assist            Wheelchair 150 feet activity      Assist          Blood pressure 101/64, pulse 73, temperature 98.2 F (36.8 C), temperature source Oral, resp. rate 18, height 5\' 10"  (1.778 m), weight 94.1 kg, SpO2 99 %.    Medical Problem List and Plan: 1.  Left hemiparesis secondary to multiple small acute infarcts Bilateral frontal lobe, right thalamic microhemorrhage after cardiac arrest with successful PCI             -patient may  Shower             -ELOS/Goals: 2 weeks -supervision therapy evals to day  2.  Antithrombotics: -DVT/anticoagulation: SCDs             -antiplatelet therapy: Aspirin 81 mg daily, Plavix 75 mg daily 3. Pain Management: Oxycodone as needed 4. Mood: Valproic acid 250 mg twice daily, Rozerem 8 mg nightly, melatonin 3 mg nightly as needed             -antipsychotic agents: Seroquel 50 mg nightly 5. Neuropsych: This patient is not capable of making decisions on his own behalf. 6. Skin/Wound Care: Routine skin checks, occipital decubitus, avoid pressure no dressing needed at this time 7. Fluids/Electrolytes/Nutrition: Routine in and outs with follow-up chemistries 8.  Acute systolic congestive heart failure.  Continue low-dose Entresto 24-26 mg twice daily, Aldactone 25 mg daily and Coreg 3.125 mg twice daily    9.  Ileus.  Dysphagia-  Cortrak placed 02/21/2020 hoping to remove soon.  Diet slowly advanced. Get Calorie count x 24 hours and see if can remove Cortrak. Ate 100% meal x 1 , fluid intake is low  10.  Diabetes mellitus.  Hemoglobin A1c 6.0.  Currently on Levemir 5 units daily.  Question change to Jardiance at discharge. CBG (last 3)  Recent Labs    03/03/20 0034 03/03/20 0623 03/03/20 1121  GLUCAP 102* 100* 105*    LOS: 4 days A FACE TO FACE EVALUATION WAS PERFORMED  03/05/20 Jillianna Stanek 03/03/2020, 2:16 PM

## 2020-03-03 NOTE — Progress Notes (Signed)
Occupational Therapy Session Note  Patient Details  Name: Jeffrey Morse MRN: [id number] Date of Birth: January 01, 1956  Today's Date: 03/03/2020 OT Individual Time: 1300-1359 OT Individual Time Calculation (min): 59 min  16 minutes missed due to fatigue  Short Term Goals: Week 1:  OT Short Term Goal 1 (Week 1): Pt will demonstrate improved activity tolerance to be able to complete a shower without a drop in blood pressure. OT Short Term Goal 2 (Week 1): Pt will be able to don a shirt with no cues demonstrating improved cognition. OT Short Term Goal 3 (Week 1): Pt will be able to don pants with CGA. OT Short Term Goal 4 (Week 1): Pt will demonstrate improved standing balance and awareness of L foot placement during toileting tasks with CGA.    Skilled Therapeutic Interventions/Progress Updates:    Pt greeted in a chair with NT assessing his BP. Reading was 101/64. Pt asymptomatic and really wanting a shower. He ambulated without AD and CGA to the toilet where he doffed his clothing, cues to sit vs stand at appropriate times. Transitioned to shower where pt bathed at sit<stand level with lukewarm water. Dressing was completed sit<stand from toilet, Total A for thigh high Teds and binder. Note the same sit vs stand cues were provided during LB dressing. He ambulated with CGA to the chair in his room where BP was assessed again, reading 93/68, pt still asymptomatic. Oral care was completed while standing at the sink, pt preferring to stand vs sit when given the option. He reported feeling very fatigued afterwards and asked to terminate therapy early to rest. Provided him with an aromatherapy blend to promote relaxation while having a neutral effect on BP (peppermint + lavender combination). We also found a black patch of skin on the back of his head, not painful though numb to light touch. Notified RN, MD, and primary OT. Pt transferred to bed and was left with all needs within reach and bed alarm set, in  sidelying position to relieve pressure from the back of his head.   Arm IV covered prior to shower today  Therapy Documentation Precautions:  Precautions Precautions: Fall,Other (comment) Precaution Comments: monitor for orthostatic hypotension Restrictions Weight Bearing Restrictions: No RLE Weight Bearing: Weight bearing as tolerated Vital Signs: Therapy Vitals Temp: 98.2 F (36.8 C) Temp Source: Oral Pulse Rate: 73 Resp: 18 BP: 101/64 Patient Position (if appropriate): Sitting Oxygen Therapy SpO2: 99 % O2 Device: Room Air Pain: no c/o pain during session   ADL: ADL Eating: Supervision/safety Grooming: Supervision/safety Upper Body Bathing: Supervision/safety Where Assessed-Upper Body Bathing: Shower Lower Body Bathing: Contact guard Where Assessed-Lower Body Bathing: Shower Upper Body Dressing: Supervision/safety,Moderate cueing Where Assessed-Upper Body Dressing: Chair Lower Body Dressing: Moderate assistance,Moderate cueing Where Assessed-Lower Body Dressing: Chair Toileting: Minimal assistance Where Assessed-Toileting: Teacher, adult education: Curator Method: Risk manager: Minimal Microbiologist Method: Warden/ranger: Shower seat with back,Grab bars      Therapy/Group: Individual Therapy  Ehab Humber A Alyn Riedinger 03/03/2020, 3:36 PM

## 2020-03-03 NOTE — Progress Notes (Signed)
Orthopedic Tech Progress Note Patient Details:  Jeffrey Morse 14-Oct-1955 [id number] Was told by RN that the patients abdominal binder was too small, a larger size was delivered to the patients room Ortho Devices Type of Ortho Device: Abdominal binder Ortho Device/Splint Interventions: Ordered   Post Interventions Patient Tolerated: Other (comment) Instructions Provided: Other (comment)   Gerald Stabs 03/03/2020, 11:56 AM

## 2020-03-03 NOTE — Progress Notes (Addendum)
Therapy brought attention to closed wound on back of pt head. Scabbed area noted to crown of head. Pt states he cannot feel sensation to area. Periwound area has blanchable redness. MD Kirsteins notified per therapy. WOC to assess on MONDAY Mylo Red, LPN

## 2020-03-03 NOTE — Progress Notes (Signed)
Physical Therapy Session Note  Patient Details  Name: Jeffrey Morse MRN: [id number] Date of Birth: 1955/12/25  Today's Date: 03/03/2020 PT Individual Time: 0925-1022 PT Individual Time Calculation (min): 57 min   Short Term Goals: Week 1:  PT Short Term Goal 1 (Week 1): Pt will perform supine<>sit with CGA PT Short Term Goal 2 (Week 1): Pt will perform sit<>stands using LRAD with CGA PT Short Term Goal 3 (Week 1): Pt will perform bed<>chair transfers using LRAD with CGA PT Short Term Goal 4 (Week 1): Pt will ambulate at least 158ft using LRAD with CGA PT Short Term Goal 5 (Week 1): Pt will ascend/descend 8 steps using 1 HR with CGA  Skilled Therapeutic Interventions/Progress Updates:    Pt received lying down in recliner and agreeable to therapy session. Transitioned to upright sitting in recliner and assessed vitals: BP 84/56 (MAP 66), HR 78bpm although pt denies any symptoms donned B LE thigh high TED hose and abdominal binder - notified RN and present to assess manually and reports dynamap reading is accurate. Therapist spoke with Dr. Wynn Banker and he requested to perform in-room therapy at this time. Pt in agreement with this plan.  Performed the following seated exercises:  - B LE long arc quads 2x20 reps each then added 4lb weight and performed 2x20reps - B LE marching 2x20reps then added 4lb weight and repeated 2x20reps  - B LE ankle DF against 4lb weight 2x20 reps  - B UE bicep curls using 6lb weights x10reps each -  Overhead presses without weight due to limited L UE AROM and due to discomfort in chest x8 reps  Therapist providing cuing for proper form/technique throughout. Pt requesting to return to bed due to feeling "drained". Stand pivot recliner>EOB with min assist for balance. Sit>supine with supervision for safety. Pt left supine in bed with needs in reach, TED hose on, and bed alarm on.  Therapy Documentation Precautions:  Precautions Precautions: Fall,Other  (comment) Precaution Comments: monitor for orthostatic hypotension Restrictions Weight Bearing Restrictions: No RLE Weight Bearing: Weight bearing as tolerated  Pain:   Reports some pain/discomfort in L chest region when performing L UE AROM overhead - limited repetitions and therapist facilitated movement.   Therapy/Group: Individual Therapy  Ginny Forth , PT, DPT, CSRS  03/03/2020, 8:07 AM

## 2020-03-04 LAB — GLUCOSE, CAPILLARY
Glucose-Capillary: 159 mg/dL — ABNORMAL HIGH (ref 70–99)
Glucose-Capillary: 132 mg/dL — ABNORMAL HIGH (ref 70–99)
Glucose-Capillary: 154 mg/dL — ABNORMAL HIGH (ref 70–99)
Glucose-Capillary: 102 mg/dL — ABNORMAL HIGH (ref 70–99)

## 2020-03-04 NOTE — Consult Note (Addendum)
WOC Nurse Consult Note: Reason for Consult: Area of dried serum vs eschar to occiput Wound type: Traumatic wound.  Wife reports that patient fell in parking lot of Urgent Care on 02/15/20 making contact with a car door handle with his head (the impact broke the car door handle). Wife reports learning of bleeding from the area while patient was in ICU. Patient denies pain during palpation and assessment. Pressure Injury POA: N/A Measurement: 3.8cm x 5cm eschar well hidden beneath hair.  Wound EMV:VKPQAESLPNP obscured by the presence of black dried serum vs eschar Drainage (amount, consistency, odor) None Periwound:soft, no fluctuance, no hematoma noted Dressing procedure/placement/frequency: I will provide guidance for the Nursing team to pain the affected area twice daily with betadine swab sticks to both provide an astringent to the area as well as to have an antimicrobial effect.  Hair washing should transpire at least twice weekly. Patient and wife are in agreement with the POC that will begin today.  I have discussed this POC with the patient's nurse at the bedside.   WOC nursing team will not follow, but will remain available to this patient, the nursing and medical teams.  Please re-consult if needed.  Thanks, Ladona Mow, MSN, RN, GNP, [facility] [place]  Pager# [phone]

## 2020-03-04 NOTE — Progress Notes (Signed)
Occupational Therapy Session Note  Patient Details  Name: Jeffrey Morse MRN: [id number] Date of Birth: 1955/10/31  Today's Date: 03/04/2020 OT Group Time: 1100-1145 OT Group Time Calculation (min): 45 min  Short Term Goals: Week 1:  OT Short Term Goal 1 (Week 1): Pt will demonstrate improved activity tolerance to be able to complete a shower without a drop in blood pressure. OT Short Term Goal 2 (Week 1): Pt will be able to don a shirt with no cues demonstrating improved cognition. OT Short Term Goal 3 (Week 1): Pt will be able to don pants with CGA. OT Short Term Goal 4 (Week 1): Pt will demonstrate improved standing balance and awareness of L foot placement during toileting tasks with CGA.  Skilled Therapeutic Interventions/Progress Updates:    Pt engaged in therapeutic w/c level dance group focusing on patient choice, UE/LE strengthening, salience, activity tolerance, and social participation. Pt was guided through various dance-based exercises involving UEs/LEs and trunk. All music was selected by group members. Emphasis placed on activity tolerance and general strengthening. Both pt and his wife [name] attended group. Pt agreeable to participate while seated for BP safety though he was wearing abdominal binder and thigh high Teds. Pt and wife were actively engaged during group, following guided exercises and initiating rest breaks when needed. Wife escorted pt back to room before termination of group due to lightheadedness. Once nursing staff assisted pt back to bed, he reported lightheadedness absolved.    Therapy Documentation Precautions:  Precautions Precautions: Fall,Other (comment) Precaution Comments: monitor for orthostatic hypotension Restrictions Weight Bearing Restrictions: No RLE Weight Bearing: Weight bearing as tolerated Vital Signs: Therapy Vitals Temp: 98.5 F (36.9 C) Temp Source: Oral Pulse Rate: 66 Resp: 18 BP: 118/67 Patient Position (if appropriate):  Lying Oxygen Therapy SpO2: 97 % O2 Device: Room Air Pain: no s/s pain during tx   ADL: ADL Eating: Supervision/safety Grooming: Supervision/safety Upper Body Bathing: Supervision/safety Where Assessed-Upper Body Bathing: Shower Lower Body Bathing: Contact guard Where Assessed-Lower Body Bathing: Shower Upper Body Dressing: Supervision/safety,Moderate cueing Where Assessed-Upper Body Dressing: Chair Lower Body Dressing: Moderate assistance,Moderate cueing Where Assessed-Lower Body Dressing: Chair Toileting: Minimal assistance Where Assessed-Toileting: Teacher, adult education: Curator Method: Risk manager: Minimal Microbiologist Method: Warden/ranger: Shower seat with back,Grab bars     Therapy/Group: Group Therapy  [employer] 03/04/2020, 3:33 PM

## 2020-03-05 LAB — GLUCOSE, CAPILLARY
Glucose-Capillary: 98 mg/dL (ref 70–99)
Glucose-Capillary: 81 mg/dL (ref 70–99)
Glucose-Capillary: 75 mg/dL (ref 70–99)
Glucose-Capillary: 113 mg/dL — ABNORMAL HIGH (ref 70–99)

## 2020-03-05 MED ORDER — CARVEDILOL 3.125 MG PO TABS
3.1250 mg | ORAL_TABLET | Freq: Two times a day (BID) | ORAL | Status: DC
  Administered 2020-03-05 – 2020-03-09 (×7): 3.125 mg via ORAL
  Filled 2020-03-05 (×8): qty 1

## 2020-03-05 NOTE — Progress Notes (Signed)
Speech Language Pathology Daily Session Note  Patient Details  Name: Jeffrey Morse MRN: [id number] Date of Birth: Jan 19, 1956  Today's Date: 03/05/2020 SLP Individual Time: 1115-1200 SLP Individual Time Calculation (min): 45 min  Short Term Goals: Week 1: SLP Short Term Goal 1 (Week 1): Pt will consume current diet with Supervision A verbal cueing for use of compensatory swallowing strategies. SLP Short Term Goal 2 (Week 1): Pt will consume trials of regular texture solids at least X2 with efficient mastication, oral clearance, and Supervision A for use of swallow strategies prior to upgrade. SLP Short Term Goal 3 (Week 1): Pt will demontrate ability to problem solve functional semi-complex to complex problem solving with Min A verbal and visual cues. SLP Short Term Goal 4 (Week 1): Pt will identify 2 impairments in setting acute CVA (1 physical and 1 cognitive) provided Min A verbal/visual cues. SLP Short Term Goal 5 (Week 1): Pt will demonstrate ability to recall new and functional daily information with Min A cues for use of compensatory strategies.  Skilled Therapeutic Interventions: Pt seen for skilled ST with focus on cognition, wife present throughout tx session. Pt participating in semi-complex problem solving with focus on discharge environment benefiting from min A verbal cues. Pt with improved frustration tolerance this date, continues to state he doesn't enjoy cognitive tasks that have subjects that are of no interest to him. Pt demonstrates emerging awareness of current physical/cognitive impairments that will impact him in d/c environment. SLP facilitating semi-complex reasoning task by providing mod A verbal cues for 80% accuracy. Pt left in bed with wife present and all neds met. Cont ST POC.  Pain Pain Assessment Pain Scale: 0-10 Pain Score: 0-No pain  Therapy/Group: Individual Therapy  Dewaine Conger 03/05/2020, 11:40 AM

## 2020-03-05 NOTE — Progress Notes (Addendum)
Advanced Heart Failure Rounding Note  PCP:  Primary Cardiologist:   Subjective:    1/12 admitted for Vfib arrest, cath PCI/DES and impella 1/13 Echo EF ~50-55% LV small RV ok. 1/14 VT arrest, ROSC after 3 minutes.  1/15 Impella removed 1/16 Left sided weakness. MRI with multiple small infarcts and small ICH 1/18 Extubated.  1/22 Developed hyperactive delirium at night, head CT neg, started on valproate and seroquel  1/25 Admitted to CIR cortrak removed  He continues to do well in CIR.  Tolerating therapy well.  No symptoms of orthostasis.  Some chest soreness with movement or sneezing from surgical site.  He denies chest pain or dyspnea with exertion.  Repeating orthostatics this am, low dose carvedilol was discontinued over the weekend.     Objective:   Weight Range: 94.1 kg Body mass index is 29.77 kg/m.   Vital Signs:   Temp:  [98.3 F (36.8 C)-98.9 F (37.2 C)] 98.9 F (37.2 C) (01/31 0441) Pulse Rate:  [66-71] 71 (01/31 0441) Resp:  [17-18] 17 (01/31 0441) BP: (95-118)/(62-80) 95/62 (01/31 0441) SpO2:  [94 %-100 %] 94 % (01/31 0441) Last BM Date: 03/04/20  Weight change: Filed Weights   02/28/20 1500  Weight: 94.1 kg    Intake/Output:   Intake/Output Summary (Last 24 hours) at 03/05/2020 0927 Last data filed at 03/05/2020 0845 Gross per 24 hour  Intake 680 ml  Output 1325 ml  Net -645 ml      Physical Exam    Cardiac: JVD flat, normal rate and rhythm, clear s1 and s2, no murmurs, rubs or gallops, no LE edema Pulmonary: CTAB, not in distress Abdominal: non distended abdomen, soft and nontender Psych: Alert, conversant, in good spirits    Telemetry   n/a  EKG    n/a  Labs    CBC No results for input(s): WBC, NEUTROABS, HGB, HCT, MCV, PLT in the last 72 hours. Basic Metabolic Panel Recent Labs    43/15/40 0632  NA 135  K 4.3  CL 104  CO2 21*  GLUCOSE 100*  BUN 26*  CREATININE 1.02  CALCIUM 9.2   Liver Function Tests No  results for input(s): AST, ALT, ALKPHOS, BILITOT, PROT, ALBUMIN in the last 72 hours. No results for input(s): LIPASE, AMYLASE in the last 72 hours. Cardiac Enzymes No results for input(s): CKTOTAL, CKMB, CKMBINDEX, TROPONINI in the last 72 hours.  BNP: BNP (last 3 results) No results for input(s): BNP in the last 8760 hours.  ProBNP (last 3 results) No results for input(s): PROBNP in the last 8760 hours.   D-Dimer No results for input(s): DDIMER in the last 72 hours. Hemoglobin A1C No results for input(s): HGBA1C in the last 72 hours. Fasting Lipid Panel No results for input(s): CHOL, HDL, LDLCALC, TRIG, CHOLHDL, LDLDIRECT in the last 72 hours. Thyroid Function Tests No results for input(s): TSH, T4TOTAL, T3FREE, THYROIDAB in the last 72 hours.  Invalid input(s): FREET3  Other results:   Imaging    No results found.   Medications:     Scheduled Medications: . aspirin  81 mg Oral Daily  . clopidogrel  75 mg Oral Daily  . empagliflozin  10 mg Oral Daily  . escitalopram  20 mg Oral Daily  . feeding supplement  237 mL Oral BID BM  . insulin aspart  0-15 Units Subcutaneous TID AC & HS  . insulin glargine  9 Units Subcutaneous QHS  . multivitamin with minerals  1 tablet Oral Daily  .  nystatin  5 mL Mouth/Throat QID  . pantoprazole  40 mg Oral Daily  . QUEtiapine  25 mg Oral QHS  . ramelteon  8 mg Oral QHS  . rosuvastatin  40 mg Oral Daily  . sacubitril-valsartan  1 tablet Oral BID  . spironolactone  12.5 mg Oral Daily  . valproic acid  250 mg Oral BID    Infusions:   PRN Medications: acetaminophen **OR** [DISCONTINUED] acetaminophen (TYLENOL) oral liquid 160 mg/5 mL **OR** [DISCONTINUED] acetaminophen, melatonin, Muscle Rub     Assessment/Plan   1. Cardiac (VTarrest in setting of acute inferolateral STEMI/VT - OOH cardiac arrest on 1/12 with prolonged CPR, resuscitated w/ CPR + defibrillation  - Emergent cath 1/12 w/ totally occluded mid LCx treated w/  PCI + DES - Recurrent VT on 1/14 -> defib x 1 - Off amiodarone  - Supp K PRN - Mag stable.  - ideally would like to start back carvedilol this afternoon, will d/c seroquel, valproate.    2. Acute systolic HF due in setting of acute MI -> cardiogenic shock - EF 25% in cath lab 1/12 - Echo 1/13 EF 50-55% - Impella pulled 1/14, CVP 5-7 CO-OX remains stable. - Renal function stable.  - Continue low dose entresto, would like to add back carvedilol, drinking a little better but still not taking in enough fluids will d/c jardiance can add back outpatient.    3. CAD with acute inferolateral  STEMI  - Emergent cath 1/12 w/ totally occluded mid LCx treated w/ PCI + DES -There is moderate ostial LAD disease and significant disease in the distal LAD close to the apex. No significant disease affecting the right coronary artery. - Continue DAPT. Restarted statin - Brilinta switched to Plavix; Plavix therapeutic per P2y12 lab.  - would like to restart low dose carvedilol this afternoon  4. Acute CVA with ICH - left-sided weakness --> now not following commands; repeat CT shows stable hemorrhage - MRI with multiple small infarcts and small ICH (Likely cardioembolic from arrest) - Brilinta switched to Plavix -L sided weakness and coordination continues to improve with rehab  5. Hyperactive Delirium -began on 1/22 at night -he was started on seroquel and valproic acid, seroquel increased 1/24 -sleeping better, no further episodes reported will d/c seroquel and valproate to possibly help with orthostasis and bp.    6. DM2 - HgbA1c 6.0 - now well controlled d/c jardiance but continue basal bolus insulin   Length of Stay: 6   Angelita Ingles, MD  03/05/2020, 9:27 AM   Patient seen and examined with the above-signed Advanced Practice Provider and/or Housestaff. I personally reviewed laboratory data, imaging studies and relevant notes. I independently examined the patient and formulated  the important aspects of the plan. I have edited the note to reflect any of my changes or salient points. I have personally discussed the plan with the patient and/or family.  Doing well but remains lightheaded. Carvedilol stopped over the weekend. Denies CP or SOB.   General:  Well appearing. No resp difficulty HEENT: normal Neck: supple. no JVD. Carotids 2+ bilat; no bruits. No lymphadenopathy or thryomegaly appreciated. Cor: PMI nondisplaced. Regular rate & rhythm. No rubs, gallops or murmurs. Lungs: clear Abdomen: soft, nontender, nondistended. No hepatosplenomegaly. No bruits or masses. Good bowel sounds. Extremities: no cyanosis, clubbing, rash, edema Neuro: alert & orientedx3, cranial nerves grossly intact. moves all 4 extremities w/o difficulty. Affect pleasant  Suspect volume depletion may be issue. Will stop jardiance for now. Mat  be able to add back low-dose carvedilol. May need to switch entresto to losartan if not improving.   Arvilla Meres, MD  10:35 AM

## 2020-03-05 NOTE — Progress Notes (Signed)
Occupational Therapy Session Note  Patient Details  Name: Jeffrey Morse MRN: [id number] Date of Birth: [birth year]  Today's Date: 03/05/2020 OT Individual Time: 1415-1500 OT Individual Time Calculation (min): 45 min    Short Term Goals: Week 1:  OT Short Term Goal 1 (Week 1): Pt will demonstrate improved activity tolerance to be able to complete a shower without a drop in blood pressure. OT Short Term Goal 2 (Week 1): Pt will be able to don a shirt with no cues demonstrating improved cognition. OT Short Term Goal 3 (Week 1): Pt will be able to don pants with CGA. OT Short Term Goal 4 (Week 1): Pt will demonstrate improved standing balance and awareness of L foot placement during toileting tasks with CGA.  Skilled Therapeutic Interventions/Progress Updates:    Pt received sitting in recliner with no c /o pain. Requesting to take shower. BP sitting 99/71. Ted hose on. Discussed BP and shower and pt agreeable to keep teds on as precautionary measure. Ambulatory transfer into the bathroom with CGA. He required min cueing for safety awareness when doffing LB clothing but was able to do so with CGA overall. He transferred into shower and completed all bathing at supervision level overall. He returned to EOB and completed UB dressing with mod I. LB dressing with supervision. BP following shower without teds on 118/82. Pt returned to supine and completed brief LUE focused [facility] activity with 90% accuracy during palm > finger translation. He was given ideas for HEP for [facility] specifically. Pt was left supine with all needs met, bed alarm set.   Therapy Documentation Precautions:  Precautions Precautions: Fall,Other (comment) Precaution Comments: monitor for orthostatic hypotension Restrictions Weight Bearing Restrictions: No RLE Weight Bearing: Weight bearing as tolerated  Therapy/Group: Individual Therapy  [name] 03/05/2020, 6:13 AM

## 2020-03-05 NOTE — Progress Notes (Signed)
[place] PHYSICAL MEDICINE & REHABILITATION PROGRESS NOTE   Subjective/Complaints: He c/o left chest wall tenderness, feels from rib fracture from CPR, does not feel he needs a lidocaine patch Moving bowels regularly.   ROS- neg SOB,N/V/D, +left chest wall pain  Objective:   No results found. No results for input(s): WBC, HGB, HCT, PLT in the last 72 hours. Recent Labs    03/03/20 0632  NA 135  K 4.3  CL 104  CO2 21*  GLUCOSE 100*  BUN 26*  CREATININE 1.02  CALCIUM 9.2    Intake/Output Summary (Last 24 hours) at 03/05/2020 1158 Last data filed at 03/05/2020 0845 Gross per 24 hour  Intake 380 ml  Output 1325 ml  Net -945 ml        Physical Exam: Vital Signs Blood pressure 95/62, pulse 71, temperature 98.9 F (37.2 C), resp. rate 17, height 5\' 10"  (1.778 m), weight 94.1 kg, SpO2 94 %. Gen: no distress, normal appearing HEENT: oral mucosa pink and moist, NCAT Cardio: Reg rate Chest: normal effort, normal rate of breathing Abd: soft, non-distended Ext: no edema Psych: pleasant, normal affect Skin: intact  Sensory exam normal sensation to light touch and proprioception in bilateral upper and lower extremities  Musculoskeletal: Full range of motion in all 4 extremities. No joint swelling    Assessment/Plan: 1. Functional deficits which require 3+ hours per day of interdisciplinary therapy in a comprehensive inpatient rehab setting.  Physiatrist is providing close team supervision and 24 hour management of active medical problems listed below.  Physiatrist and rehab team continue to assess barriers to discharge/monitor patient progress toward functional and medical goals  Care Tool:  Bathing    Body parts bathed by patient: Right arm,Left arm,Chest,Abdomen,Front perineal area,Buttocks,Right upper leg,Left upper leg,Right lower leg,Left lower leg,Face         Bathing assist Assist Level: Supervision/Verbal cueing     Upper Body  Dressing/Undressing Upper body dressing   What is the patient wearing?: Pull over shirt    Upper body assist Assist Level: Supervision/Verbal cueing    Lower Body Dressing/Undressing Lower body dressing      What is the patient wearing?: Underwear/pull up,Pants     Lower body assist Assist for lower body dressing: Contact Guard/Touching assist     Toileting Toileting    Toileting assist Assist for toileting: Contact Guard/Touching assist     Transfers Chair/bed transfer  Transfers assist     Chair/bed transfer assist level: Minimal Assistance - Patient > 75%     Locomotion Ambulation   Ambulation assist      Assist level: Contact Guard/Touching assist Assistive device: Walker-rolling Max distance: 150   Walk 10 feet activity   Assist     Assist level: Contact Guard/Touching assist Assistive device: Walker-rolling   Walk 50 feet activity   Assist    Assist level: Contact Guard/Touching assist Assistive device: Walker-rolling    Walk 150 feet activity   Assist Walk 150 feet activity did not occur: Safety/medical concerns  Assist level: Contact Guard/Touching assist Assistive device: Walker-rolling    Walk 10 feet on uneven surface  activity   Assist Walk 10 feet on uneven surfaces activity did not occur: Safety/medical concerns         Wheelchair     Assist Will patient use wheelchair at discharge?: No             Wheelchair 50 feet with 2 turns activity    Assist  Wheelchair 150 feet activity     Assist          Blood pressure 95/62, pulse 71, temperature 98.9 F (37.2 C), resp. rate 17, height 5\' 10"  (1.778 m), weight 94.1 kg, SpO2 94 %.    Medical Problem List and Plan: 1.  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  Antithrombotics: -DVT/anticoagulation: SCDs             -antiplatelet therapy: Aspirin 81 mg daily, Plavix 75 mg daily 3. Pain Management: Continue Oxycodone as needed 4. Mood:  Rozerem 8 mg nightly, melatonin 3 mg nightly as needed. Valproate d/ced by heart failure team.              -antipsychotic agents: Seroquel 50 mg nightly- d/ced by HF team. Mood is currently stable.  5. Neuropsych: This patient is not capable of making decisions on his own behalf. 6. Skin/Wound Care: Routine skin checks, occipital decubitus, avoid pressure no dressing needed at this time 7. Fluids/Electrolytes/Nutrition: Routine in and outs with follow-up chemistries 8.  Acute systolic congestive heart failure.  Continue low-dose Entresto 24-26 mg twice daily, Aldactone 25 mg daily and Coreg 3.125 mg twice daily  1/31: Appreciate heart failure team following, note reviewed.  9.  Ileus.  Dysphagia-  Cortrak placed 02/21/2020 hoping to remove soon.  Diet slowly advanced. Get Calorie count x 24 hours and see if can remove Cortrak. Ate 100% meal x 1 , fluid intake is low  10.  Diabetes mellitus.  Hemoglobin A1c 6.0.  Currently on Levemir 5 units daily.  Question change to Jardiance at discharge. CBG (last 3)  Recent Labs    03/04/20 1631 03/04/20 2110 03/05/20 0557  GLUCAP 154* 102* 98    LOS: 6 days A FACE TO FACE EVALUATION WAS PERFORMED  Adhira Jamil P Jodiann Ognibene 03/05/2020, 11:58 AM

## 2020-03-05 NOTE — Progress Notes (Signed)
Physical Therapy Session Note  Patient Details  Name: Jeffrey Morse MRN: [id number] Date of Birth: 08/26/1955  Today's Date: 03/05/2020 PT Individual Time: 0915-1015 and 1300-1330 PT Individual Time Calculation (min): 60 min and 30 min  Short Term Goals: Week 1:  PT Short Term Goal 1 (Week 1): Pt will perform supine<>sit with CGA PT Short Term Goal 2 (Week 1): Pt will perform sit<>stands using LRAD with CGA PT Short Term Goal 3 (Week 1): Pt will perform bed<>chair transfers using LRAD with CGA PT Short Term Goal 4 (Week 1): Pt will ambulate at least 169ft using LRAD with CGA PT Short Term Goal 5 (Week 1): Pt will ascend/descend 8 steps using 1 HR with CGA Week 2:    Week 3:     Skilled Therapeutic Interventions/Progress Updates:   AM SESSION PAIN denies pain during session Pt initially oob in chair, wife assisting pt w/grooming.  Pt wearing ted hose. Seated BP 81/59, HR 90 Abdominal binder applied by therapist, XL provides no compression/too large.  Second binder in room very small and very difficult to fasten.  Notified nursing and requested ordering size Large binder for BP support.  Nursing agreed to follow up/order. Performed UE/LE AROM warm up exercises in sitting. Sit to stand w/cga to RW Standing BP 86/63  HR 90  Gait trials w/RW and cga as follows: 155ft, mild wobbles L knee, slight L lean  BP 93/66 HR 91 Gait 194ft  98/66, HR 94 Gait 125ft  To room w/cga and RW, deviations as above.  Pt requested assist to BR.  Short distance gait and Commode transfer w/RW w/CGA, manages clothing w/min assist, perineal care w/set up. Short distance gait to bedside, stand to sit w/cga.  Sit to supine w/rails and supervision.  Pt left supine w/rails up x 3, alarm set, bed in lowest position, and needs in reach.   PM SESSION. PAIN  Pt initially supine.  Supine to sit w/supervision.  Abdominal binder donned by therapist.   BP 117/74  Sit to stand w/cga and gait x 65ft w/RW and  cga, continued x 40ft no AD and cga, mild increase in L tendency/lean w/slight decreased step length  BP 114/79  Sit to stand w/cga, gait 172ft no AD cga to min assist, straight path gait.  Functional gait x 173ft including: Sidestepping 59ft x 2 cga Backing 86ft x 1 cga Gait w/head turns, nods - min assist, mild L tendency w/cervical motion esp to L Sudden stops/starts min to cga.  At end of session, pt transferred to bed w/cga, sit to supine mod I. Pt left supine w/rails up x 3, alarm set, bed in lowest position, and needs in reach.  Therapy Documentation Precautions:  Precautions Precautions: Fall,Other (comment) Precaution Comments: monitor for orthostatic hypotension Restrictions Weight Bearing Restrictions: No RLE Weight Bearing: Weight bearing as tolerated    Therapy/Group: Individual Therapy  Rada Hay, PT   Shearon Balo 03/05/2020, 12:32 PM

## 2020-03-06 LAB — GLUCOSE, CAPILLARY
Glucose-Capillary: 113 mg/dL — ABNORMAL HIGH (ref 70–99)
Glucose-Capillary: 116 mg/dL — ABNORMAL HIGH (ref 70–99)
Glucose-Capillary: 110 mg/dL — ABNORMAL HIGH (ref 70–99)
Glucose-Capillary: 135 mg/dL — ABNORMAL HIGH (ref 70–99)

## 2020-03-06 NOTE — Progress Notes (Signed)
Occupational Therapy Session Note  Patient Details  Name: Jeffrey Morse MRN: [id number] Date of Birth: 1955-03-23  Today's Date: 03/06/2020 OT Individual Time: 0801-0903 OT Individual Time Calculation (min): 62 min    Short Term Goals: Week 1:  OT Short Term Goal 1 (Week 1): Pt will demonstrate improved activity tolerance to be able to complete a shower without a drop in blood pressure. OT Short Term Goal 2 (Week 1): Pt will be able to don a shirt with no cues demonstrating improved cognition. OT Short Term Goal 3 (Week 1): Pt will be able to don pants with CGA. OT Short Term Goal 4 (Week 1): Pt will demonstrate improved standing balance and awareness of L foot placement during toileting tasks with CGA.  Skilled Therapeutic Interventions/Progress Updates:   Pt completed dressing, toileting, and grooming tasks to begin session.  His BP was taken in supine at 111/75 and then in sitting EOB without TEDs or binder at 117/82.  He was able to complete donning his own knee high TEDs as well as pants and gripper socks with setup only.  He ambulated to the bathroom without an assistive device with supervision and stood to urinate.  He was able to walk out to the sink for washing his hands at the same level.  Had pt ambulate out of the room without an assistive device with supervision down to the tub room and the dayroom.  He was able to step in and out of the tub with use of the wall for support with supervision.  Discussed need for a shower seat and pt reports that he will check with his spouse to see if she can get one.  Next, he ambulated down to the dayroom for next part of session with supervision at a slower rate than normal.  Had him work in sitting at the high/low table and working on [employer] while dividing attention with conversation.  He was able to complete two puzzles in a timely manner with only two errors.  Returned to the room at the end of the session without use of an assistive  device with the call button and phone in reach.  Safety alarm in place.     Therapy Documentation Precautions:  Precautions Precautions: Fall,Other (comment) Precaution Comments: monitor for orthostatic hypotension Restrictions Weight Bearing Restrictions: No RLE Weight Bearing: Weight bearing as tolerated   Pain: Pain Assessment Pain Scale: Faces Pain Score: 0-No pain ADL: See Care Tool Section for some details of mobility and selfcare  Therapy/Group: Individual Therapy  Yailyn Strack OTR/L 03/06/2020, 12:10 PM

## 2020-03-06 NOTE — Progress Notes (Signed)
Speech Language Pathology Daily Session Note  Patient Details  Name: XZAYVIER FAGIN MRN: [id number] Date of Birth: 1955/06/30  Today's Date: 03/06/2020 SLP Individual Time: 1000-1056 SLP Individual Time Calculation (min): 56 min  Short Term Goals: Week 1: SLP Short Term Goal 1 (Week 1): Pt will consume current diet with Supervision A verbal cueing for use of compensatory swallowing strategies. SLP Short Term Goal 2 (Week 1): Pt will consume trials of regular texture solids at least X2 with efficient mastication, oral clearance, and Supervision A for use of swallow strategies prior to upgrade. SLP Short Term Goal 3 (Week 1): Pt will demontrate ability to problem solve functional semi-complex to complex problem solving with Min A verbal and visual cues. SLP Short Term Goal 4 (Week 1): Pt will identify 2 impairments in setting acute CVA (1 physical and 1 cognitive) provided Min A verbal/visual cues. SLP Short Term Goal 5 (Week 1): Pt will demonstrate ability to recall new and functional daily information with Min A cues for use of compensatory strategies.  Skilled Therapeutic Interventions: Pt was seen for skilled ST targeting cognitive goals. SLP facilitated session with a complex medication management task. His wife was present and appropriately engaged during session. Pt required full review of current medications for recall of newly prescribed meds since admission, but able to use list as compensatory strategy when organizing pill box with Supervision A for organization. He organized a BID pill box according to his list of current meds with 90% accuracy with 1 verbal cue for awareness of error (although Mod I for problem solving the correction). Pt attended to tasks with Supervision A verbal cues for redirection (back pain noted to influence distractibility). Discussed recommendation for Supervision with medication management upon his return home for greatest safety in relation to memory and  emergent awareness deficits that are mild in nature but could have serious impact with task such as this. Pt and wife in agreement. Pt and wife with many questions regarding medications that SLP directed them to ask MD. Pt left sitting in bed with alarm set and needs within reach, wife still present. Continue per current plan of care.          Pain Pain Assessment Pain Scale: Faces Pain Type: Chronic pain Pain Location: Back Pain Descriptors / Indicators: Grimacing;Restless Pain Onset: On-going Pain Intervention(s): Repositioned Multiple Pain Sites: No  Therapy/Group: Individual Therapy  Little Ishikawa 03/06/2020, 7:22 AM

## 2020-03-06 NOTE — Progress Notes (Signed)
Speech Language Pathology Daily Session Note  Patient Details  Name: Jeffrey Morse MRN: [id number] Date of Birth: 10/21/1955  Today's Date: 03/06/2020 SLP Individual Time: 1300-1327 SLP Individual Time Calculation (min): 27 min  Short Term Goals: Week 1: SLP Short Term Goal 1 (Week 1): Pt will consume current diet with Supervision A verbal cueing for use of compensatory swallowing strategies. SLP Short Term Goal 2 (Week 1): Pt will consume trials of regular texture solids at least X2 with efficient mastication, oral clearance, and Supervision A for use of swallow strategies prior to upgrade. SLP Short Term Goal 3 (Week 1): Pt will demontrate ability to problem solve functional semi-complex to complex problem solving with Min A verbal and visual cues. SLP Short Term Goal 4 (Week 1): Pt will identify 2 impairments in setting acute CVA (1 physical and 1 cognitive) provided Min A verbal/visual cues. SLP Short Term Goal 5 (Week 1): Pt will demonstrate ability to recall new and functional daily information with Min A cues for use of compensatory strategies.  Skilled Therapeutic Interventions: Pt was seen for skilled ST targeting dysphagia goals. SLP facilitated session with an advanced trial of regular textures. Pt demonstrated ability to use compensatory safe swallowing strategies Mod I. His mastication and oral clearance of regular textures was efficient and complete without cueing. No overt s/sx aspiration observed with regular or thin. Discussed considerations with regular textures in context of his hx of hiatal hernia. Recommend advance to regular textures, continue thin liquids, medications may be given whole. SLP will follow up briefly to ensure tolerance of advancement. Pt left sitting upright in bed with alarm set and needs within reach. Continue per current plan of care.        Pain Pain Assessment Pain Scale: Faces Faces Pain Scale: No hurt  Therapy/Group: Individual Therapy  Jeffrey Morse 03/06/2020, 7:22 AM

## 2020-03-06 NOTE — Progress Notes (Signed)
Physical Therapy Session Note  Patient Details  Name: Jeffrey Morse MRN: [id number] Date of Birth: March 26, 1955  Today's Date: 03/06/2020 PT Individual Time: 3149-7026 PT Individual Time Calculation (min): 70 min   Short Term Goals: Week 1:  PT Short Term Goal 1 (Week 1): Pt will perform supine<>sit with CGA PT Short Term Goal 2 (Week 1): Pt will perform sit<>stands using LRAD with CGA PT Short Term Goal 3 (Week 1): Pt will perform bed<>chair transfers using LRAD with CGA PT Short Term Goal 4 (Week 1): Pt will ambulate at least 174ft using LRAD with CGA PT Short Term Goal 5 (Week 1): Pt will ascend/descend 8 steps using 1 HR with CGA  Skilled Therapeutic Interventions/Progress Updates:    Pt received supine in bed with his wife, Jeffrey Morse, present and pt agreeable to therapy session. Supine>sitting L EOB, not using bed features, supervision. Knee high TED hose already donned - applied abdominal binder. Assessed vitals: BP 105/73 (MAP 84), HR 79bpm with pt denying any symptoms throughout session. Sit<>stand transfers, no AD, supervision during session. Dynamic gait training at least 346ft, no AD, with CGA for safety but no overt LOB only intermittent unsteadiness with pt able to recover with no more than min assist including: sudden start/stops, R/L and up/down head turns, sudden stops and turn R/L, backwards walking, fast/slow walking, lateral side stepping at least 67ft with sudden changes in direction. Pt discussed his long term goal of going back to work with hired assistance for more of the physical aspects of the job - discussed recommendation for follow-up OPPT to continue challenging/advancing his dynamic gait and standing balance along with his cardiovascular endurance. Pt discussed his other goal of improving his diet - therapist sent referral to dietician to provide education on heart healthy diet. Ascended/descended 12 steps (using B HRs for 8 then no HRs for last 4) and primarily reciprocal  stepping pattern using step-to on bottom step all with CGA for steadying/safety. Gait training throughout session, no AD, to/from various therapy gyms with CGA for safety. Dynamic standing balance task via 4 square stepping over hockey sticks with pt demoing most difficulty stepping backwards or diagonally leading with L LE though improving with cues - min assist for balance. Performed circuit style training including 2 sets of 10reps of the following:  - squats - R lunges - L lunges Provided CGA for safety and cuing for proper form/technique with increased glute activation and trunk upright. Requires ~55minute rest break between sets. Educated pt on fall risk safety and when to call 911 - pt able to recall this education 2x during session with min progressing to no cues. Demonstrated how to safely perform floor transfer and pt able to replicate with CGA for safety. Gait training back to room and pt requesting to lie down. Sit>supine supervision. Reassessed vitals: BP 102/77 (MAP 86), HR 90bpm, SpO2 98%. Pt left supine in bed with needs in reach and bed alarm on.  Therapy Documentation Precautions:  Precautions Precautions: Fall,Other (comment) Precaution Comments: monitor for orthostatic hypotension Restrictions Weight Bearing Restrictions: No RLE Weight Bearing: Weight bearing as tolerated  Pain: Reports some rib pain but otherwise no complaints of pain during session.   Therapy/Group: Individual Therapy  Ginny Forth , PT, DPT, CSRS  03/06/2020, 12:59 PM

## 2020-03-06 NOTE — Progress Notes (Addendum)
[place] PHYSICAL MEDICINE & REHABILITATION PROGRESS NOTE   Subjective/Complaints: Chest wall pain improving, no dizziness during therapy   ROS- neg SOB,N/V/D, +left chest wall pain  Objective:   No results found. No results for input(s): WBC, HGB, HCT, PLT in the last 72 hours. No results for input(s): NA, K, CL, CO2, GLUCOSE, BUN, CREATININE, CALCIUM in the last 72 hours.  Intake/Output Summary (Last 24 hours) at 03/06/2020 0753 Last data filed at 03/06/2020 0155 Gross per 24 hour  Intake 600 ml  Output 825 ml  Net -225 ml        Physical Exam: Vital Signs Blood pressure 94/69, pulse 73, temperature 98.2 F (36.8 C), resp. rate 16, height 5\' 10"  (1.778 m), weight 94.1 kg, SpO2 96 %.  General: No acute distress Mood and affect are appropriate Heart: Regular rate and rhythm no rubs murmurs or extra sounds Lungs: Clear to auscultation, breathing unlabored, no rales or wheezes Abdomen: Positive bowel sounds, soft nontender to palpation, nondistended Extremities: No clubbing, cyanosis, or edema Skin: No evidence of breakdown, no evidence of rash  Sensory exam normal sensation to light touch and proprioception in bilateral upper and lower extremities  Musculoskeletal: Full range of motion in all 4 extremities. No joint swelling    Assessment/Plan: 1. Functional deficits which require 3+ hours per day of interdisciplinary therapy in a comprehensive inpatient rehab setting.  Physiatrist is providing close team supervision and 24 hour management of active medical problems listed below.  Physiatrist and rehab team continue to assess barriers to discharge/monitor patient progress toward functional and medical goals  Care Tool:  Bathing    Body parts bathed by patient: Right arm,Left arm,Chest,Abdomen,Front perineal area,Buttocks,Right upper leg,Left upper leg,Right lower leg,Left lower leg,Face         Bathing assist Assist Level: Supervision/Verbal cueing      Upper Body Dressing/Undressing Upper body dressing   What is the patient wearing?: Pull over shirt    Upper body assist Assist Level: Supervision/Verbal cueing    Lower Body Dressing/Undressing Lower body dressing      What is the patient wearing?: Underwear/pull up,Pants     Lower body assist Assist for lower body dressing: Contact Guard/Touching assist     Toileting Toileting    Toileting assist Assist for toileting: Contact Guard/Touching assist     Transfers Chair/bed transfer  Transfers assist     Chair/bed transfer assist level: Contact Guard/Touching assist     Locomotion Ambulation   Ambulation assist      Assist level: Contact Guard/Touching assist Assistive device: Walker-rolling Max distance: 150   Walk 10 feet activity   Assist     Assist level: Contact Guard/Touching assist Assistive device: Walker-rolling   Walk 50 feet activity   Assist    Assist level: Contact Guard/Touching assist Assistive device: Walker-rolling    Walk 150 feet activity   Assist Walk 150 feet activity did not occur: Safety/medical concerns  Assist level: Contact Guard/Touching assist Assistive device: Walker-rolling    Walk 10 feet on uneven surface  activity   Assist Walk 10 feet on uneven surfaces activity did not occur: Safety/medical concerns         Wheelchair     Assist Will patient use wheelchair at discharge?: No             Wheelchair 50 feet with 2 turns activity    Assist            Wheelchair 150 feet activity  Assist          Blood pressure 94/69, pulse 73, temperature 98.2 F (36.8 C), resp. rate 16, height 5\' 10"  (1.778 m), weight 94.1 kg, SpO2 96 %.    Medical Problem List and Plan: 1.  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  Antithrombotics: -DVT/anticoagulation: SCDs             -antiplatelet therapy: Aspirin 81 mg daily, Plavix 75 mg daily 3. Pain Management: Continue Oxycodone as needed 4. Mood:  Rozerem 8 mg nightly, melatonin 3 mg nightly as needed. Valproate d/ced by heart failure team.              -antipsychotic agents: Seroquel 50 mg nightly- d/ced by HF team. Mood is currently stable.  5. Neuropsych: This patient is not capable of making decisions on his own behalf. 6. Skin/Wound Care: Routine skin checks, occipital decubitus, avoid pressure no dressing needed at this time 7. Fluids/Electrolytes/Nutrition: Routine in and outs with follow-up chemistries 8.  Acute systolic congestive heart failure.  Continue low-dose Entresto 24-26 mg twice daily, Aldactone 25 mg daily and Coreg 3.125 mg twice daily  1/31: Appreciate heart failure team following, note reviewed.  Vitals:   03/05/20 2003 03/06/20 0535  BP: 117/77 94/69  Pulse: 68 73  Resp: 18 16  Temp: 98 F (36.7 C) 98.2 F (36.8 C)  SpO2: 98% 96%  BPs a little soft but asymptomatic , coreg added back per [facility] team, now off Jadiance 9.  Ileus.  Dysphagia-  Cortrak placed 02/21/2020 hoping to remove soon.  Diet slowly advanced. Get Calorie count x 24 hours and see if can remove Cortrak. Ate 100% meal x 1 , fluid intake is low  10.  Diabetes mellitus.  Hemoglobin A1c 6.0.  Currently on Levemir 5 units daily.  Question change to Jardiance at discharge. CBG (last 3)  Recent Labs    03/05/20 1644 03/05/20 2053 03/06/20 0535  GLUCAP 75 113* 113*    LOS: 7 days A FACE TO FACE EVALUATION WAS PERFORMED  05/04/20 03/06/2020, 7:53 AM

## 2020-03-07 LAB — GLUCOSE, CAPILLARY
Glucose-Capillary: 107 mg/dL — ABNORMAL HIGH (ref 70–99)
Glucose-Capillary: 132 mg/dL — ABNORMAL HIGH (ref 70–99)
Glucose-Capillary: 173 mg/dL — ABNORMAL HIGH (ref 70–99)
Glucose-Capillary: 80 mg/dL (ref 70–99)

## 2020-03-07 MED ORDER — INSULIN GLARGINE 100 UNIT/ML ~~LOC~~ SOLN
7.0000 [IU] | Freq: Every day | SUBCUTANEOUS | Status: DC
  Administered 2020-03-08: 7 [IU] via SUBCUTANEOUS
  Filled 2020-03-07 (×3): qty 0.07

## 2020-03-07 MED ORDER — ZOLPIDEM TARTRATE 5 MG PO TABS
5.0000 mg | ORAL_TABLET | Freq: Every evening | ORAL | Status: DC | PRN
  Administered 2020-03-07 – 2020-03-08 (×2): 5 mg via ORAL
  Filled 2020-03-07 (×2): qty 1

## 2020-03-07 MED ORDER — BLOOD GLUCOSE METER KIT: PACK | 0 refills | Status: DC

## 2020-03-07 NOTE — Patient Care Conference (Signed)
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 Claudette Laws Care Coodinator Present: Chana Bode, RN, BSN, CRRN;Christina Vita Barley, BSW Nurse Present: Jesusita Oka, LPN PT Present: Casimiro Needle, PT OT Present: Perrin Maltese, OT SLP Present: Suzzette Righter, CF-SLP PPS Coordinator present : Edson Snowball, Park Breed, SLP     Current Status/Progress Goal Weekly Team Focus  Bowel/Bladder   continent B/B, last BM-2/1  remail continent  assess q shift and PRN   Swallow/Nutrition/ Hydration   dys 3 textures, thin liquids, Supervision-Mod I  Mod I  regular texture trials, carryover swallow strategies   ADL's   Supervsion for all bathing, dressing, grooming, and functional transfers without an assistive device.  supervision overall  selfcare retraining, balance retraining, transfer training, neuromuscular re-education, cognitive retraining, pt/family education   Mobility   supervision bed mobility, sit<>stand, and stand pivot transfers without AD; CGA gait up to 386ft w/o AD; CGA 12 steps using HRs  supervision overall at ambulatory level  activity tolerance, transfer training, dynamic standing balance, dynamic gait training, pt/family education, cardiovascular endurance, B LE strengthening   Communication             Safety/Cognition/  Behavioral Observations  Min A error awareness, recall  Supevision A  recall with aids/strategies, emergent awareness, complex problem solving   Pain   denies pain  pain<3  assess pain q shift and PRN   Skin   laceration head-scabbed  remain free skin breakdown  assess skin q shift and PRN     Discharge Planning:  Discharging home with spouse. 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 Note chest soreness post CPR.  Note BPs are a little soft however DM controlled. Patient demonstrated improvement with orientation, problem solving and coordination as well as agitation noted prior has resolved.  Patient on target to meet rehab goals: yes, currently supervision for upper body ADLs and CGA for transfers with assistive device. Supervision goals set for discharge.  *See Care Plan and progress notes for long and short-term goals.   Revisions to Treatment Plan:  Upgraded goals, advanced to regular diet.  Teaching Needs: Safety, compensatory strategies for cognition recovery, medications, DM management, etc.  Current Barriers to Discharge: Decreased caregiver support and New diabetic  Possible Resolutions to Barriers: Family education OP follow up recommended     Medical Summary Current Status: Blood pressure still soft, per cardiology carvedilol should be continued if possible.  Have encouraged patient to increase intake.  Still has soreness from CPR  Barriers to Discharge: Medical stability   Possible Resolutions to Barriers/Weekly Focus: Increase fluids, monitor blood pressure, support hose and abdominal binder to prevent orthostatic hypotension   Continued Need for Acute Rehabilitation Level of Care: The patient requires daily medical management by a physician  with specialized training in physical medicine and rehabilitation for the following reasons: Direction of a multidisciplinary physical  rehabilitation program to maximize functional independence : Yes Medical management of patient stability for increased activity during participation in an intensive rehabilitation regime.: Yes Analysis of laboratory values and/or radiology reports with any subsequent need for medication adjustment and/or medical intervention. : Yes   I attest that I was present, lead the team conference, and concur with the assessment and plan of the team.   Chana Bode B 03/07/2020, 2:11 PM

## 2020-03-07 NOTE — Progress Notes (Signed)
Speech Language Pathology Weekly Progress and Session Note  Patient Details  Name: Jeffrey Morse MRN: [id number] Date of Birth: 05-Feb-1955  Beginning of progress report period: February 29, 2020 End of progress report period: March 07, 2020  Today's Date: 03/07/2020 SLP Individual Time: 8502-7741 SLP Individual Time Calculation (min): 28 min  Short Term Goals: Week 1: SLP Short Term Goal 1 (Week 1): Pt will consume current diet with Supervision A verbal cueing for use of compensatory swallowing strategies. SLP Short Term Goal 1 - Progress (Week 1): Met SLP Short Term Goal 2 (Week 1): Pt will consume trials of regular texture solids at least X2 with efficient mastication, oral clearance, and Supervision A for use of swallow strategies prior to upgrade. SLP Short Term Goal 2 - Progress (Week 1): Met SLP Short Term Goal 3 (Week 1): Pt will demontrate ability to problem solve functional semi-complex to complex problem solving with Min A verbal and visual cues. SLP Short Term Goal 3 - Progress (Week 1): Met SLP Short Term Goal 4 (Week 1): Pt will identify 2 impairments in setting acute CVA (1 physical and 1 cognitive) provided Min A verbal/visual cues. SLP Short Term Goal 4 - Progress (Week 1): Met SLP Short Term Goal 5 (Week 1): Pt will demonstrate ability to recall new and functional daily information with Min A cues for use of compensatory strategies. SLP Short Term Goal 5 - Progress (Week 1): Met    New Short Term Goals: Week 2: SLP Short Term Goal 1 (Week 2): STG=LTG due to remaining length of stay  Weekly Progress Updates: Pt has made functional gains and met 5 out of 5 short term goals. Pt is currently Supervision-Min assist for functional semi-complex to complex tasks due to mild cognitive impairments, primarily in awareness of cognitive deficits and anticipatory awareness, short term memory, complex problem solving, and higher level attention. Pt is consuming an upgraded regular  textures diet with thin liquids and is Mod I for use of his compensatory safe swallowing strategies. Pt has demonstrated improved participation in cognitive tasks, complex problem solving, intellectual awareness, attention, and recall/carryover of new and daily information. Pt and family education is ongoing - pt's wife has been present for nearly every session and has been appropriately engaged, asking good questions about swallowing and cognitive function along the way. Pt would continue to benefit from skilled ST while inpatient in order to maximize functional independence and reduce burden of care prior to discharge. Anticipate that pt will need 24/7 supervision at discharge in addition to [place] follow up at next level of care.      Intensity: Minumum of 1-2 x/day, 30 to 90 minutes Frequency: 3 to 5 out of 7 days Duration/Length of Stay: 03/09/20 Treatment/Interventions: Cognitive remediation/compensation;Cueing hierarchy;Dysphagia/aspiration precaution training;Functional tasks;Patient/family education;Internal/external aids   Daily Session  Skilled Therapeutic Interventions: Pt was seen for skilled ST targeting cognitive skills. Pt's wife was present at bedside throughout session. Pt reported lack of appetite but no difficulty with consuming recently upgraded regular textured dinner and breakfasts. He completed a basic emergent awareness task in which he identified logical and detail oriented mistakes on a calendar with Supervision A verbal cues. He required Supervision A verbal cues for selective attention to tasks throughout session. Pt left sitting upright in bed with alarm set and needs within reach, wife still present. Continue per current plan of care.     Pain Pain Assessment Pain Scale: Faces Faces Pain Scale: No hurt  Therapy/Group: Individual Therapy  Arbutus Leas 03/07/2020, 10:11 AM

## 2020-03-07 NOTE — Progress Notes (Signed)
Discussed with patient/wife and Dr. Wynn Banker to continue coreg. No complications noted at this time.  Jay Schlichter, LPN

## 2020-03-07 NOTE — Progress Notes (Signed)
Physical Therapy Session Note  Patient Details  Name: Jeffrey Morse MRN: [id number] Date of Birth: 03-17-1955  Today's Date: 03/07/2020 PT Individual Time: 1108-1208 and 1305-1410 PT Individual Time Calculation (min): 60 min and 65 min  Short Term Goals: Week 1:  PT Short Term Goal 1 (Week 1): Pt will perform supine<>sit with CGA PT Short Term Goal 2 (Week 1): Pt will perform sit<>stands using LRAD with CGA PT Short Term Goal 3 (Week 1): Pt will perform bed<>chair transfers using LRAD with CGA PT Short Term Goal 4 (Week 1): Pt will ambulate at least 138ft using LRAD with CGA PT Short Term Goal 5 (Week 1): Pt will ascend/descend 8 steps using 1 HR with CGA  Skilled Therapeutic Interventions/Progress Updates:    Session 1: Pt received supine in bed with his wife, [name], present and pt agreeable to therapy session. Reports feeling a little sore from session yesterday. Supine>sitting L EOB independently. Pt able to recall falls risk education yesterday without cuing - therapist provided additional education to pt's wife regarding fall risks and safety in the home including night lights and removing throw rugs. Pt already wearing B LE knee high TED hose - assessed seated vitals in R arm: BP 87/64 (MAP 72), HR 72bpm donned abdominal binder and reassessed in L arm: BP 94/65 (MAP 76), HR 72bpm. Sit<>stands, no AD, with supervision. Gait training ~153ft x2 to/form main therapy gym, no AD, with close supervision/intermittent CGA. Reassessed vitals: BP 108/73 (MAP 79), HR 69bpm Patient participated in [employer] and demonstrates increased fall risk as noted by score of   45/56.  (<36= high risk for falls, close to 100%; 37-45 significant >80%; 46-51 moderate >50%; 52-55 lower >25%).  Pt participated in Functional Gait Assessment (FGA) with score of 20/30 demonstrating medium fall risk (low fall risk 25-28, medium fall risk 19-24, and high fall risk <19). Reassessed vitals after FGA: BP 102/73 (MAP 83),  HR 75bpm  Educated pt and wife on results of tests and the primary balance challenges that result in instability and how to monitor for those during daily activities and the tasks that pt's wife should guard pt for safety. At end of session pt left sitting in chair with needs in reach and his wife present - educated wife to notify nursing if she is going to leave.   Balance:  Berg Balance Test Sit to Stand: Able to stand without using hands and stabilize independently Standing Unsupported: Able to stand safely 2 minutes Sitting with Back Unsupported but Feet Supported on Floor or Stool: Able to sit safely and securely 2 minutes Stand to Sit: Sits safely with minimal use of hands Transfers: Able to transfer safely, minor use of hands Standing Unsupported with Eyes Closed: Able to stand 10 seconds safely Standing Ubsupported with Feet Together: Able to place feet together independently and stand 1 minute safely From Standing, Reach Forward with Outstretched Arm: Can reach confidently >25 cm (10") From Standing Position, Pick up Object from Floor: Able to pick up shoe safely and easily From Standing Position, Turn to Look Behind Over each Shoulder: Looks behind from both sides and weight shifts well Turn 360 Degrees: Needs close supervision or verbal cueing (minor posterior lean) Standing Unsupported, Alternately Place Feet on Step/Stool: Able to complete >2 steps/needs minimal assist Standing Unsupported, One Foot in Front: Able to take small step independently and hold 30 seconds Standing on One Leg: Tries to lift leg/unable to hold 3 seconds but remains standing independently Total  Score: 45 Functional Gait  Assessment Gait assessed : Yes Gait Level Surface: Walks 20 ft in less than 5.5 sec, no assistive devices, good speed, no evidence for imbalance, normal gait pattern, deviates no more than 6 in outside of the 12 in walkway width. Change in Gait Speed: Able to smoothly change walking speed  without loss of balance or gait deviation. Deviate no more than 6 in outside of the 12 in walkway width. Gait with Horizontal Head Turns: Performs head turns smoothly with slight change in gait velocity (eg, minor disruption to smooth gait path), deviates 6-10 in outside 12 in walkway width, or uses an assistive device. Gait with Vertical Head Turns: Performs task with slight change in gait velocity (eg, minor disruption to smooth gait path), deviates 6 - 10 in outside 12 in walkway width or uses assistive device Gait and Pivot Turn: Pivot turns safely in greater than 3 sec and stops with no loss of balance, or pivot turns safely within 3 sec and stops with mild imbalance, requires small steps to catch balance. Step Over Obstacle: Is able to step over one shoe box (4.5 in total height) without changing gait speed. No evidence of imbalance. Gait with Narrow Base of Support: Ambulates 4-7 steps. Gait with Eyes Closed: Walks 20 ft, slow speed, abnormal gait pattern, evidence for imbalance, deviates 10-15 in outside 12 in walkway width. Requires more than 9 sec to ambulate 20 ft. Ambulating Backwards: Walks 20 ft, uses assistive device, slower speed, mild gait deviations, deviates 6-10 in outside 12 in walkway width. Steps: Alternating feet, must use rail. Total Score: 20  Session 2: Pt received asleep, supine in bed with his wife present and upon awakening pt agreeable to therapy session. Pt appears fatigued this afternoon. Supine>sitting L EOB independent. Sitting EOB donned abdominal binder - vitals: BP 99/69, HR 78bpm - denies symptoms throughout session. Educated pt/wife on purpose of abdominal binder and TED hose with importance of monitoring pt's BPs at home - encouraged them to speak more with MD/PA regarding pt's BP recommendations and times of days that he should be checking vitals. Sit<>stands, no AD, supervision during session. Gait ~174ft x2 to/from gym, no AD, with close supervision on pt's L  side for safety. Participated in limited repetitions of the following exercises and provided pt with printed HEP and education on safe set-up of each exercise: - repeated sit<>stands  - lateral side stepping at counter support with theraband resistance around knees (green band provided) - standing heel raises with R/L bias working towards single leg heel raise  - lunges with counter support through limited ROM - static standing in semi- tandem - static standing in narrow BOS during vertical head nods  Reinforced education regarding exercises with pt's wife to ensure pt safety. Remainder of session focused on community reintegration and gait training outdoors with pt ambulating >1,034ft, no AD, with close supervision and intermittent CGA for steadying including the following:  - walking up/down sloped ramp - ascending/descending 4 outdoor steps using only 1 HR - stepping up 3 diagonal steps without HR but using HHA - ascending 6 steps using only 1 HR  - walking over unlevel outdoor surfaces Pt's wife present during outdoor ambulation and therapist educating her on where to stand to provide pt with close supervision for safety. At end of session pt left supine in bed with his wife present and needs in reach.   Therapy Documentation Precautions:  Precautions Precautions: Fall,Other (comment) Precaution Comments: monitor for orthostatic  hypotension Restrictions Weight Bearing Restrictions: No RLE Weight Bearing: Weight bearing as tolerated  Pain:   Session 1: No complaints of pain, only some soreness from exercising yesterday.  Session 2: Reports slight discomfort in knee joints but otherwise no c/o pain - rest breaks provided for pain management as needed.   Therapy/Group: Individual Therapy  Ginny Forth , PT, DPT, CSRS  03/07/2020, 7:58 AM

## 2020-03-07 NOTE — Progress Notes (Signed)
Nutrition Follow-up  RD working remotely.  DOCUMENTATION CODES:   Not applicable  INTERVENTION:   - Please obtain updated weight, last available weight is from 02/28/20  - Continue Ensure Enlive po BID, each supplement provides 350 kcal and 20 grams of protein  - Continue Magic Cup BID with meals, each supplement provides 290 kcal and 9 grams of protein  - Continue MVI with minerals daily  - Diet education provided and handout attached to pt's AVS/Discharge Instructions  NUTRITION DIAGNOSIS:   Increased nutrient needs related to post-op healing,other (therapies) as evidenced by estimated needs.  Ongoing  GOAL:   Patient will meet greater than or equal to 90% of their needs  Progressing  MONITOR:   PO intake,Supplement acceptance,Labs,Weight trends  REASON FOR ASSESSMENT:   Consult Calorie Count  ASSESSMENT:   65 year old male with PMH of HTN, HLD. Presented 02/15/20 after PEA arrest. Pt required intubation. 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 Cortrak placed on 02/21/20 and diet slowly advanced dysphagia 2 with thin liquids. Admitted to CIR on 1/25.  1/26 - diet advanced to dysphagia 3 with thin liquids, Cortrak tube removed 1/02 - diet advanced to regular  Spoke with pt via phone call to room. Pt's spouse in room at time of phone call.  Pt repots appetite is still not yet back to baseline "because I'm not doing anything." Pt reports that difficulties sleeping and lethargy contributed to him not eating much for breakfast this morning. He states that he was able to have some cereal but did not want the bacon or eggs.  Discussed basic principles of heart healthy diet with pt but encouraged pt to focus less on diet restrictions at this time and more on adequate kcal and protein intake. Pt and  spouse expressed understanding. Discussed high-protein foods and importance of consuming these first at mealtimes if appetite remains poor. "High Protein Foods List" handout from the Academy [employer] attached to pt's AVS/Discharge Instructions.  Pt reports that if he gets cold strawberry Ensure supplements, he will drink them. Otherwise, he does not care for them. Pt's preferences added to order.  Meal Completion: 25-100%  Medications reviewed and include: Ensure Enlive BID, SSI, lantus 7 units daily, MVI with minerals daily, protonix, spironolactone  Labs reviewed: BUN 26 CBG's: 107-135 x 24 hours  Diet Order:   Diet Order            Diet regular Room service appropriate? Yes; Fluid consistency: Thin  Diet effective now                 EDUCATION NEEDS:   Education needs have been addressed  Skin:  Skin Assessment: Skin Integrity Issues: Incisions: laceration to head  Last BM:  03/06/20 small type 5  Height:   Ht Readings from Last 1 Encounters:  02/28/20 5\' 10"  (1.778 m)    Weight:   Wt Readings from Last 1 Encounters:  02/28/20 94.1 kg    BMI:  Body mass index is 29.77 kg/m.  Estimated Nutritional Needs:   Kcal:  2300-2500  Protein:  120-140 grams  Fluid:  2.0 L/day    03/01/20, MS, RD, LDN Inpatient Clinical Dietitian Please see [facility] for contact information.

## 2020-03-07 NOTE — Discharge Summary (Signed)
Physician Discharge Summary  Patient ID: Jeffrey Morse MRN: [id number] DOB/AGE: 1955/06/28 65 y.o.  Admit date: 02/28/2020 Discharge date: 03/09/2020  Discharge Diagnoses:  Principal Problem:   Ischemic cerebrovascular accident (CVA) of frontal lobe (HCC) DVT prophylaxis Acute systolic congestive heart failure Mood stabilization Ileus New findings diabetes mellitus PEA arrest Hyperlipidemia  Discharged Condition: Stable  Significant Diagnostic Studies: DG Abd 1 View  Result Date: 02/19/2020 CLINICAL DATA:  Orogastric tube placement. EXAM: ABDOMEN - 1 VIEW COMPARISON:  None. FINDINGS: The bowel gas pattern is normal. Distal tip of enteric tube is seen in the stomach in grossly good position. No radio-opaque calculi or other significant radiographic abnormality are seen. IMPRESSION: Distal tip of enteric tube seen in the stomach in grossly good position. Electronically Signed   By: Lupita Raider M.D.   On: 02/19/2020 14:11   DG Abd 1 View  Result Date: 02/19/2020 CLINICAL DATA:  Orogastric tube placement. EXAM: ABDOMEN - 1 VIEW COMPARISON:  Same day. FINDINGS: The bowel gas pattern is normal. Distal tip of nasogastric tube appears to be in the proximal stomach. However, proximal side hole appears to be in the expected position of gastroesophageal junction. No radio-opaque calculi or other significant radiographic abnormality are seen. IMPRESSION: Distal tip of nasogastric tube appears to be in the proximal stomach. However, proximal side hole appears to be in the expected position of gastroesophageal junction. Advancement is recommended. Electronically Signed   By: Lupita Raider M.D.   On: 02/19/2020 13:38   DG Abd 1 View  Result Date: 02/19/2020 CLINICAL DATA:  Nasal/orogastric tube placement. EXAM: ABDOMEN - 1 VIEW COMPARISON:  None. FINDINGS: Nasal/orogastric tube has its tip within the stomach, side hole projecting in the expected location of the gastroesophageal junction. Normal  bowel gas pattern. IMPRESSION: Nasogastric tube tip within the stomach, side hole the gastroesophageal junction. Electronically Signed   By: Amie Portland M.D.   On: 02/19/2020 13:37   DG Abd 1 View  Result Date: 02/19/2020 CLINICAL DATA:  Feeding tube placement. EXAM: ABDOMEN - 1 VIEW COMPARISON:  None. FINDINGS: The bowel gas pattern is normal. Distal portion of nasogastric tube appears to be looped in the distal esophagus, with the distal tip in the distal esophagus. No radio-opaque calculi or other significant radiographic abnormality are seen. IMPRESSION: Distal portion of nasogastric tube appears to be looped in the distal esophagus, with the distal tip in the distal esophagus. Electronically Signed   By: Lupita Raider M.D.   On: 02/19/2020 10:53   DG Abd 1 View  Result Date: 02/19/2020 CLINICAL DATA:  65 year old male status post cardiac arrest, cardiogenic shock. Ileus. EXAM: ABDOMEN - 1 VIEW COMPARISON:  02/17/2020 FINDINGS: Portable AP supine view at 0938 hours. NG tube is folded back on itself, and the tip could be within the distal esophagus or at the gastroesophageal junction now. Recommend advancing and repeating the radiograph. Normal bowel gas pattern. Negative abdominal and pelvic visceral contours. Pelvic phleboliths. No acute osseous abnormality identified. IMPRESSION: 1. NG tube folded back on itself, and could be extending retrograde within the distal esophagus. Recommend advancing the tube several cm and repeating portable radiograph of the upper abdomen. 2. Normal bowel gas pattern. These results will be called to the ordering clinician or representative by the Radiologist Assistant, and communication documented in the PACS or [employer]. Electronically Signed   By: Odessa Fleming M.D.   On: 02/19/2020 10:03   CT HEAD WO CONTRAST  Result Date: 02/26/2020 CLINICAL  DATA:  Mental status change with unknown cause.  Agitation. EXAM: CT HEAD WITHOUT CONTRAST TECHNIQUE: Contiguous axial  images were obtained from the base of the skull through the vertex without intravenous contrast. COMPARISON:  Head CT from 6 days ago FINDINGS: Brain: No evidence of interval infarction, hemorrhage, hydrocephalus, extra-axial collection or mass lesion/mass effect. There are small subacute white matter infarcts which are underestimated relative to admission brain MRI, best visualized today in the right centrum semiovale. Vascular: No hyperdense vessel or unexpected calcification. Skull: Normal. Negative for fracture or focal lesion. Sinuses/Orbits: Partial right mastoid opacification. Right sphenoid and maxillary sinus fluid level. Sinus aeration is improved from prior. IMPRESSION: 1. Motion degraded head CT without new abnormality or reversible finding. 2. Improved sinus inflammation compared to 6 days ago. Electronically Signed   By: Marnee Spring M.D.   On: 02/26/2020 06:53   CT HEAD WO CONTRAST  Result Date: 02/20/2020 CLINICAL DATA:  Stroke follow-up EXAM: CT HEAD WITHOUT CONTRAST TECHNIQUE: Contiguous axial images were obtained from the base of the skull through the vertex without intravenous contrast. COMPARISON:  None. FINDINGS: Brain: Unchanged punctate focus of blood at the right thalamus. Areas of faint hypoattenuation in the frontal lobes correspond to known recent infarcts. No mass lesion or extra-axial collection. Vascular: No abnormal hyperdensity of the major intracranial arteries or dural venous sinuses. No intracranial atherosclerosis. Skull: The visualized skull base, calvarium and extracranial soft tissues are normal. Sinuses/Orbits: No fluid levels or advanced mucosal thickening of the visualized paranasal sinuses. No mastoid or middle ear effusion. The orbits are normal. IMPRESSION: 1. Unchanged punctate focus of blood at the right thalamus. 2. Faint hypoattenuation in the frontal lobes correspond to known recent infarcts. Electronically Signed   By: Deatra Robinson M.D.   On: 02/20/2020  02:07   CT Head Wo Contrast  Result Date: 02/17/2020 CLINICAL DATA:  Altered mental status. EXAM: CT HEAD WITHOUT CONTRAST TECHNIQUE: Contiguous axial images were obtained from the base of the skull through the vertex without intravenous contrast. COMPARISON:  January 17, 2012 FINDINGS: Brain: There is mild cerebral atrophy with widening of the extra-axial spaces and ventricular dilatation. There are areas of decreased attenuation within the white matter tracts of the supratentorial brain, consistent with microvascular disease changes. A 4 mm x 4 mm x 6 mm hyperdense focus is seen within the thalamic region on the right (axial CT image 19). This represents a new finding when compared to the prior study. A trace amount of adjacent anterior white matter low attenuation is noted. There is no evidence of associated mass effect or midline shift. Vascular: No hyperdense vessel or unexpected calcification. Skull: Normal. Negative for fracture or focal lesion. Sinuses/Orbits: There is moderate to marked severity bilateral ethmoid sinus and nasal mucosal thickening. Moderate sized air-fluid levels are seen within the right maxillary sinus and sphenoid sinus. Other: Endotracheal and nasogastric tubes are seen. IMPRESSION: 1. Subcentimeter hyperdense focus within the right thalamic region, suspicious for a small acute bleed. MRI correlation is recommended. 2. Mild cerebral atrophy. 3. Moderate to marked severity paranasal sinus disease. Electronically Signed   By: Aram Candela M.D.   On: 02/17/2020 17:16   MR ANGIO HEAD WO CONTRAST  Result Date: 02/17/2020 CLINICAL DATA:  Anoxic brain injury. EXAM: MRI HEAD WITHOUT CONTRAST MRA HEAD WITHOUT CONTRAST TECHNIQUE: Multiplanar, multiecho pulse sequences of the brain and surrounding structures were obtained without intravenous contrast. Angiographic images of the head were obtained using MRA technique without contrast. COMPARISON:  Brain MRI  01/18/2012 FINDINGS: MRI  HEAD FINDINGS Brain: There are multiple small acute infarcts within the frontal lobes. There are microhemorrhages in the right thalamus and left parietal lobe. The left parietal hemorrhage is unchanged, but the right thalamic hemorrhage is new. Aside from the above described lesions, the white matter signal is normal. The midline structures are normal. Vascular: Major flow voids are preserved. Skull and upper cervical spine: Normal calvarium and skull base. Visualized upper cervical spine and soft tissues are normal. Sinuses/Orbits:Fluid in the ethmoid and sphenoid sinuses and right maxillary sinus. Bilateral mastoid effusions. Normal orbits. MRA HEAD FINDINGS POSTERIOR CIRCULATION: --Vertebral arteries: Normal --Inferior cerebellar arteries: Normal. --Basilar artery: Normal. --Superior cerebellar arteries: Normal. --Posterior cerebral arteries: Normal. ANTERIOR CIRCULATION: --Intracranial internal carotid arteries: Normal. --Anterior cerebral arteries (ACA): Normal. --Middle cerebral arteries (MCA): Normal. ANATOMIC VARIANTS: None IMPRESSION: 1. Multiple small acute infarcts within the frontal lobes. 2. New right thalamic microhemorrhage, since 01/18/12. 3. Normal intracranial MRA. Electronically Signed   By: Deatra Robinson M.D.   On: 02/17/2020 23:14   MR BRAIN WO CONTRAST  Result Date: 02/17/2020 CLINICAL DATA:  Anoxic brain injury. EXAM: MRI HEAD WITHOUT CONTRAST MRA HEAD WITHOUT CONTRAST TECHNIQUE: Multiplanar, multiecho pulse sequences of the brain and surrounding structures were obtained without intravenous contrast. Angiographic images of the head were obtained using MRA technique without contrast. COMPARISON:  Brain MRI 01/18/2012 FINDINGS: MRI HEAD FINDINGS Brain: There are multiple small acute infarcts within the frontal lobes. There are microhemorrhages in the right thalamus and left parietal lobe. The left parietal hemorrhage is unchanged, but the right thalamic hemorrhage is new. Aside from the  above described lesions, the white matter signal is normal. The midline structures are normal. Vascular: Major flow voids are preserved. Skull and upper cervical spine: Normal calvarium and skull base. Visualized upper cervical spine and soft tissues are normal. Sinuses/Orbits:Fluid in the ethmoid and sphenoid sinuses and right maxillary sinus. Bilateral mastoid effusions. Normal orbits. MRA HEAD FINDINGS POSTERIOR CIRCULATION: --Vertebral arteries: Normal --Inferior cerebellar arteries: Normal. --Basilar artery: Normal. --Superior cerebellar arteries: Normal. --Posterior cerebral arteries: Normal. ANTERIOR CIRCULATION: --Intracranial internal carotid arteries: Normal. --Anterior cerebral arteries (ACA): Normal. --Middle cerebral arteries (MCA): Normal. ANATOMIC VARIANTS: None IMPRESSION: 1. Multiple small acute infarcts within the frontal lobes. 2. New right thalamic microhemorrhage, since 01/18/12. 3. Normal intracranial MRA. Electronically Signed   By: Deatra Robinson M.D.   On: 02/17/2020 23:14   CARDIAC CATHETERIZATION  Result Date: 02/15/2020  There is severe left ventricular systolic dysfunction.  LV end diastolic pressure is moderately elevated.  The left ventricular ejection fraction is 25-35% by visual estimate.  Ost LAD lesion is 40% stenosed.  Dist LAD lesion is 90% stenosed.  1st Mrg lesion is 90% stenosed.  Ost Cx lesion is 40% stenosed.  Prox Cx lesion is 100% stenosed.  Post intervention, there is a 0% residual stenosis.  A drug-eluting stent was successfully placed using a STENT RESOLUTE ONYX 3.0X26.  1.  Out of hospital cardiac arrest and cardiogenic shock due to occluded mid left circumflex.  The left circumflex and LAD has 2 separate ostia.  There is moderate ostial LAD disease and significant disease in the distal LAD close to the apex.  No significant disease affecting the right coronary artery. 2.  Severely reduced LV systolic function with an EF of 25%. 3.  Moderately to severely  elevated left ventricular end-diastolic pressure at the beginning of the case with an LVEDP of 28 mmHg. 4.  Successful Impella device placement via the  right common femoral artery for cardiogenic shock. 5.  Successful angioplasty and drug-eluting stent placement to the left circumflex 6.  Successful right heart catheterization via the right internal jugular vein.  Right heart catheterization at end of the case showed mildly elevated filling pressures, mild pulmonary hypertension and normal cardiac output.  Recommendations: The patient was started on cangrelor drip which will be continued for 4 hours after loading dose of Brilinta is given. Continue heparin drip for Impella Further management of cardiogenic shock per Dr. Gala Romney The patient will require dual antiplatelet therapy for at least 12 months.   DG CHEST PORT 1 VIEW  Result Date: 02/26/2020 CLINICAL DATA:  Shortness of breath EXAM: PORTABLE CHEST 1 VIEW COMPARISON:  Chest radiograph 02/21/2020 FINDINGS: Interval extubation. Enteric tube courses inferior to the diaphragm. Interval removal right upper extremity PICC line. Stable cardiac and mediastinal contours with low lung volumes elevation right hemidiaphragm. Similar mid and lower lung airspace opacities. No pleural effusion or pneumothorax. IMPRESSION: 1. Interval extubation. 2. Similar mid and lower lung airspace opacities. Electronically Signed   By: Annia Belt M.D.   On: 02/26/2020 07:11   DG CHEST PORT 1 VIEW  Result Date: 02/21/2020 CLINICAL DATA:  Intubation.  STEMI.  Cardiac arrest. EXAM: PORTABLE CHEST 1 VIEW COMPARISON:  02/20/2020. FINDINGS: Right PICC line noted with tip over lower right atrium, proximal repositioning of approximately 8 cm should be considered. Stable cardiomegaly. Low lung volumes with persistent bibasilar atelectasis/infiltrates. No pleural effusion or pneumothorax. Endotracheal tube and NG tube in stable position. IMPRESSION: 1. Right PICC line noted with tip over  lower right atrium, proximal repositioning of approximately 8 cm should be considered. 2. Endotracheal tube and NG tube in stable position. Stable cardiomegaly. 3. Persistent bibasilar atelectasis/infiltrates. These results will be called to the ordering clinician or representative by the Radiologist Assistant, and communication documented in the PACS or [employer]. Electronically Signed   By: Maisie Fus  Register   On: 02/21/2020 06:47   DG CHEST PORT 1 VIEW  Result Date: 02/20/2020 CLINICAL DATA:  Intubation EXAM: PORTABLE CHEST 1 VIEW COMPARISON:  February 19, 2020 FINDINGS: The endotracheal tube terminates above the carina. The right-sided PICC line is well position. The enteric tube extends below the left hemidiaphragm. There is a dense retrocardiac airspace opacity which is worse when compared to prior study. There is no pneumothorax. There is vascular congestion with mild interstitial edema. There are small bilateral pleural effusions. There are few perihilar airspace opacities. IMPRESSION: 1. Lines and tubes as above. 2. Worsening retrocardiac airspace opacity favored to represent atelectasis. 3. Small bilateral pleural effusions. 4. Vascular congestion with mild interstitial edema. Electronically Signed   By: Katherine Mantle M.D.   On: 02/20/2020 05:43   DG Chest Port 1 View  Result Date: 02/19/2020 CLINICAL DATA:  65 year old male intubated. Cardiac arrest, cardiogenic shock. Multiple small acute cerebral infarcts. EXAM: PORTABLE CHEST 1 VIEW COMPARISON:  Portable chest 02/16/2020 and earlier. FINDINGS: Portable AP semi upright view at 0635 hours. Endotracheal tube tip in good position between the clavicles and carina. Enteric tube courses to the abdomen, tip not included. Right IJ Swan-Ganz catheter removed. New right PICC line in place, PICC line tip difficult to delineate. Lower lung volumes. Mild accentuation of cardiac size. Other mediastinal contours are within normal limits. Increased  interstitial markings in both lungs with perihilar crowding of markings, mild lung base hypo ventilation. No pneumothorax or pleural effusion. Paucity of bowel gas in the visible upper abdomen. Stable visualized osseous  structures. IMPRESSION: 1. Right IJ Swan-Ganz catheter removed. New right PICC line in place. Satisfactory ET tube and visible enteric tube. 2. Lower lung volumes with diffuse atelectasis suspected. Mild interstitial edema or viral/atypical respiratory infection difficult to exclude. Electronically Signed   By: Odessa Fleming M.D.   On: 02/19/2020 08:27   DG CHEST PORT 1 VIEW  Result Date: 02/16/2020 CLINICAL DATA:  65 year old male with heart failure EXAM: PORTABLE CHEST 1 VIEW COMPARISON:  02/15/2020 FINDINGS: Cardiomediastinal silhouette unchanged size and contour. Endotracheal tube is unchanged, terminating 5.8 cm above the carina. Unchanged gastric tube projecting over the mediastinum and terminating in the abdomen out of the field of view. Unchanged right IJ sheath transmitting Swan-Ganz catheter, with the tip terminating over the right hilum. Overlying EKG leads. Low lung volumes persist with patchy hilar opacities, and coarsened interstitial markings. No pneumothorax or pleural effusion. No significant interlobular septal thickening. IMPRESSION: Low lung volumes persist, with likely atelectasis at the lung bases and no overt edema or confluent airspace disease. Unchanged position of endotracheal tube, gastric tube, right IJ sheath/Swan-Ganz. Electronically Signed   By: Gilmer Mor D.O.   On: 02/16/2020 09:21   Portable Chest x-ray  Result Date: 02/15/2020 CLINICAL DATA:  Endotracheal tube and Swan-Ganz catheter placement. EXAM: PORTABLE CHEST 1 VIEW COMPARISON:  None. FINDINGS: Endotracheal tube tip 4.4 cm from the carina at the level of the clavicular heads. Right internal jugular Swan-Ganz catheter tip in the region of the main pulmonary outflow tract. Enteric tube is in place with tip  below the diaphragm not included in the field of view. Impella device in place. Heart is normal in size with normal mediastinal contours. Vascular congestion. Retrocardiac left lung base atelectasis. Ill-defined patchy opacity in the right upper lobe. No pneumothorax or large pleural effusion. IMPRESSION: 1. Endotracheal tube tip 4.4 cm from the carina at the level of the clavicular heads. Swan-Ganz catheter tip in the region of the main pulmonary outflow tract. Impella device in place. 2. Vascular congestion. Ill-defined patchy opacity in the right upper lobe may be atelectasis, pneumonia, or asymmetric pulmonary edema. Electronically Signed   By: Narda Rutherford M.D.   On: 02/15/2020 16:14   DG Abd Portable 1V  Result Date: 02/21/2020 CLINICAL DATA:  Feeding tube placement. EXAM: PORTABLE ABDOMEN - 1 VIEW COMPARISON:  02/21/2020. FINDINGS: Feeding tube tip noted over the distal portion of the duodenum at the level of the ligament of Treitz. Contrast noted in the duodenum and proximal jejunum. IMPRESSION: Feeding tube tip noted over the distal duodenum at the level of the ligament of Treitz. Electronically Signed   By: Maisie Fus  Register   On: 02/21/2020 12:54   DG Abd Portable 1V  Result Date: 02/21/2020 CLINICAL DATA:  Feeding tube placement. EXAM: PORTABLE ABDOMEN - 1 VIEW COMPARISON:  Chest x-ray 02/21/2020.  Abdomen 02/19/2020. FINDINGS: Feeding tube noted with tip over the left upper quadrant. The feeding tube tip could be within the stomach or distal duodenum/proximal jejunum. Several loops of slightly prominent small bowel noted. Cecum is distended to approximately 11 cm. Left colon nondistended. No free air. IMPRESSION: 1. Feeding tube noted with tip over the left upper quadrant. The feeding tube tip could be within the stomach or distal duodenum/proximal jejunum. 2. Several loops of slightly prominent small bowel noted. Cecum is distended to approximately 11 cm. Electronically Signed   By: Maisie Fus   Register   On: 02/21/2020 11:05   DG Abd Portable 1V  Result Date: 02/17/2020 CLINICAL DATA:  Ileus EXAM: PORTABLE ABDOMEN - 1 VIEW COMPARISON:  Abdominal radiograph February 15, 2020. FINDINGS: Enteric tube with tip and side port below the diaphragm in stomach. Defibrillator leads overlie the left upper quadrant. The bowel gas pattern is normal. Osseous structures are unchanged. IMPRESSION: Enteric tube in satisfactory position, nonobstructive bowel gas pattern. Electronically Signed   By: Maudry Mayhew MD   On: 02/17/2020 18:18   DG Abd Portable 1V  Result Date: 02/15/2020 CLINICAL DATA:  OG tube placement. EXAM: PORTABLE ABDOMEN - 1 VIEW COMPARISON:  None. FINDINGS: Tip and side port of the enteric tube below the diaphragm in the stomach. Nonobstructive bowel gas pattern in the upper abdomen. Excreted IV contrast in the renal collecting systems. IMPRESSION: Tip and side port of the enteric tube below the diaphragm in the stomach. Electronically Signed   By: Narda Rutherford M.D.   On: 02/15/2020 16:15   EEG adult  Result Date: 02/26/2020 Rejeana Brock, MD     02/26/2020  4:42 PM History: 65 year old male being evaluated for delirium Sedation: Lorazepam given a few hours prior to study Technique: This is a 21 channel routine scalp EEG performed at the bedside with bipolar and monopolar montages arranged in accordance to the international 10/20 system of electrode placement. One channel was dedicated to EKG recording. Background: There is a well-formed posterior dominant rhythm of 9 Hz which is seen bioccipitally and is reactive to eye opening/closure..  In addition, there is an excessive amount of bifrontally predominant beta activity.  In addition there is diffuse intrusion of delta and theta range her regular activities throughout the study.  No epileptiform discharges were seen throughout the study. Photic stimulation: Physiologic driving is not performed EEG Abnormalities: 1) intrusion into  the background of diffuse irregular generalized slow activity Clinical Interpretation: This EEG is consistent with a mild generalized nonspecific cerebral dysfunction (encephalopathy). There was no seizure or seizure predisposition recorded on this study. Please note that lack of epileptiform activity on EEG does not preclude the possibility of epilepsy. Ritta Slot, MD [facility] [phone] If 7pm- 7am, please page neurology on call as listed in [facility].   VAS US CAROTID  Result Date: 02/19/2020 Carotid Arterial Duplex Study Indications:       CVA. Risk Factors:      Hypertension, hyperlipidemia, prior MI. Other Factors:     Post cardiac arrest. Limitations        Today's exam was limited due to a central line, bandages and                    patient on a ventilator. Comparison Study:  No prior study Performing Technologist: Sherren Kerns RVS  Examination Guidelines: A complete evaluation includes B-mode imaging, spectral Doppler, color Doppler, and power Doppler as needed of all accessible portions of each vessel. Bilateral testing is considered an integral part of a complete examination. Limited examinations for reoccurring indications may be performed as noted.  Right Carotid Findings: +----------+--------+--------+--------+------------------+--------+           PSV cm/sEDV cm/sStenosisPlaque DescriptionComments +----------+--------+--------+--------+------------------+--------+ CCA Prox  81      21                                         +----------+--------+--------+--------+------------------+--------+ CCA Distal50      15                                         +----------+--------+--------+--------+------------------+--------+  ICA Prox  76      24                                         +----------+--------+--------+--------+------------------+--------+ ICA Distal60      24                                          +----------+--------+--------+--------+------------------+--------+ ECA       48      14                                         +----------+--------+--------+--------+------------------+--------+ +----------+--------+-------+--------+-------------------+           PSV cm/sEDV cmsDescribeArm Pressure (mmHG) +----------+--------+-------+--------+-------------------+ Subclavian28                                         +----------+--------+-------+--------+-------------------+  Left Carotid Findings: +----------+--------+--------+--------+------------------+--------+           PSV cm/sEDV cm/sStenosisPlaque DescriptionComments +----------+--------+--------+--------+------------------+--------+ CCA Prox  100     24                                         +----------+--------+--------+--------+------------------+--------+ CCA Distal97      28                                         +----------+--------+--------+--------+------------------+--------+ ICA Prox  80      28              focal and calcific         +----------+--------+--------+--------+------------------+--------+ ICA Distal46      19                                         +----------+--------+--------+--------+------------------+--------+ ECA       102     20                                         +----------+--------+--------+--------+------------------+--------+ +----------+--------+--------+--------+-------------------+           PSV cm/sEDV cm/sDescribeArm Pressure (mmHG) +----------+--------+--------+--------+-------------------+ [id number]                                          +----------+--------+--------+--------+-------------------+ +---------+--------+--+--------+--+ VertebralPSV cm/s50EDV cm/s22 +---------+--------+--+--------+--+   Summary: Right Carotid: The extracranial vessels were near-normal with only minimal wall                thickening or plaque. Left  Carotid: The extracranial vessels were near-normal with only minimal wall               thickening or plaque. Vertebrals:  Left vertebral artery demonstrates antegrade flow. Right vertebral              artery was not visualized. Subclavians: Normal flow hemodynamics were seen in bilateral subclavian              arteries. *See table(s) above for measurements and observations.  Electronically signed by Delia Heady MD on 02/19/2020 at 12:40:47 PM.    Final    VAS [place] LOWER EXTREMITY VENOUS (DVT)  Result Date: 02/21/2020  Lower Venous DVT Study Indications: Edema.  Comparison Study: no prior Performing Technologist: Blanch Media RVS  Examination Guidelines: A complete evaluation includes B-mode imaging, spectral Doppler, color Doppler, and power Doppler as needed of all accessible portions of each vessel. Bilateral testing is considered an integral part of a complete examination. Limited examinations for reoccurring indications may be performed as noted. The reflux portion of the exam is performed with the patient in reverse Trendelenburg.  +---------+---------------+---------+-----------+----------+--------------+ RIGHT    CompressibilityPhasicitySpontaneityPropertiesThrombus Aging +---------+---------------+---------+-----------+----------+--------------+ CFV      Full           Yes      Yes                                 +---------+---------------+---------+-----------+----------+--------------+ SFJ      Full                                                        +---------+---------------+---------+-----------+----------+--------------+ FV Prox  Full                                                        +---------+---------------+---------+-----------+----------+--------------+ FV Mid   Full                                                        +---------+---------------+---------+-----------+----------+--------------+ FV DistalFull                                                         +---------+---------------+---------+-----------+----------+--------------+ PFV      Full                                                        +---------+---------------+---------+-----------+----------+--------------+ POP      Full           Yes      Yes                                 +---------+---------------+---------+-----------+----------+--------------+ PTV      Full                                                        +---------+---------------+---------+-----------+----------+--------------+  PERO     Full                                                        +---------+---------------+---------+-----------+----------+--------------+   +---------+---------------+---------+-----------+----------+--------------+ LEFT     CompressibilityPhasicitySpontaneityPropertiesThrombus Aging +---------+---------------+---------+-----------+----------+--------------+ CFV      Full           Yes      Yes                                 +---------+---------------+---------+-----------+----------+--------------+ SFJ      Full                                                        +---------+---------------+---------+-----------+----------+--------------+ FV Prox  Full                                                        +---------+---------------+---------+-----------+----------+--------------+ FV Mid   Full                                                        +---------+---------------+---------+-----------+----------+--------------+ FV DistalFull                                                        +---------+---------------+---------+-----------+----------+--------------+ PFV      Full                                                        +---------+---------------+---------+-----------+----------+--------------+ POP      Full           Yes      Yes                                  +---------+---------------+---------+-----------+----------+--------------+ PTV      Full                                                        +---------+---------------+---------+-----------+----------+--------------+ PERO     Full                                                        +---------+---------------+---------+-----------+----------+--------------+  Summary: BILATERAL: - No evidence of deep vein thrombosis seen in the lower extremities, bilaterally. - No evidence of superficial venous thrombosis in the lower extremities, bilaterally. -No evidence of popliteal cyst, bilaterally.   *See table(s) above for measurements and observations. Electronically signed by Waverly Ferrari MD on 02/21/2020 at 4:07:53 PM.    Final    VAS [place] UPPER EXTREMITY VENOUS DUPLEX  Result Date: 02/20/2020 UPPER VENOUS STUDY  Indications: Swelling, and PICC line. Limitations: Line, bandages and ventilation. Comparison Study: No prior study Performing Technologist: Sherren Kerns RVS  Examination Guidelines: A complete evaluation includes B-mode imaging, spectral Doppler, color Doppler, and power Doppler as needed of all accessible portions of each vessel. Bilateral testing is considered an integral part of a complete examination. Limited examinations for reoccurring indications may be performed as noted.  Right Findings: +----------+------------+---------+-----------+----------+--------------+ RIGHT     CompressiblePhasicitySpontaneousProperties   Summary     +----------+------------+---------+-----------+----------+--------------+ IJV                                                 Not visualized +----------+------------+---------+-----------+----------+--------------+ Subclavian               Yes       Yes                             +----------+------------+---------+-----------+----------+--------------+ Axillary                 Yes       Yes                              +----------+------------+---------+-----------+----------+--------------+ Brachial      Full       Yes       Yes                             +----------+------------+---------+-----------+----------+--------------+ Cephalic      None                                                 +----------+------------+---------+-----------+----------+--------------+ Basilic       Full                                                 +----------+------------+---------+-----------+----------+--------------+ Not all segments of the brachial or basilic veins were visualized, secondary to line and bandages. IJV not visualized secondary to ventilator.  Left Findings: +----------+------------+---------+-----------+----------+-------+ LEFT      CompressiblePhasicitySpontaneousPropertiesSummary +----------+------------+---------+-----------+----------+-------+ Subclavian               Yes       Yes                      +----------+------------+---------+-----------+----------+-------+  Summary:  Right: No evidence of deep vein thrombosis in the upper extremity. Findings consistent with acute superficial vein thrombosis involving the right cephalic vein.  Left: No evidence of thrombosis in the subclavian.  *See table(s) above for measurements  and observations.  Diagnosing physician: Fabienne Bruns MD Electronically signed by Fabienne Bruns MD on 02/20/2020 at 5:18:01 PM.    Final    ECHOCARDIOGRAM LIMITED  Result Date: 02/16/2020    ECHOCARDIOGRAM LIMITED REPORT   Patient Name:   Jeffrey Morse Date of Exam: 02/16/2020 Medical Rec #:  [id number]        Height:       70.0 in Accession #:    [id number]       Weight:       231.9 lb Date of Birth:  Aug 07, 1955        BSA:          2.223 m Patient Age:    64 years         BP:           104/62 mmHg Patient Gender: M                HR:           69 bpm. Exam Location:  Inpatient Procedure: Limited Color Doppler and Limited Echo Indications:    Cardiac arrest  I46.9  History:        Patient has prior history of Echocardiogram examinations, most                 recent 02/15/2020. Risk Factors:Hypertension and Dyslipidemia.                 Cardiac arrest.  Sonographer:    Leta Jungling RDCS Referring Phys: [id number] MUHAMMAD A ARIDA IMPRESSIONS  1. Left ventricular ejection fraction, by estimation, is 50 to 55%. The left ventricle has mildly decreased function. The left ventricle demonstrates regional wall motion abnormalities. Posterior wall hypokinesis.  2. Impella measures about 3cm into LV cavity from aortic valve  3. Right ventricular systolic function is normal. The right ventricular size is normal. FINDINGS  Left Ventricle: Left ventricular ejection fraction, by estimation, is 50 to 55%. The left ventricle has low normal function. The left ventricle demonstrates regional wall motion abnormalities. Right Ventricle: The right ventricular size is normal. Right ventricular systolic function is normal. Epifanio Lesches MD Electronically signed by Epifanio Lesches MD Signature Date/Time: 02/16/2020/9:44:38 AM    Final    ECHOCARDIOGRAM LIMITED  Result Date: 02/15/2020    ECHOCARDIOGRAM LIMITED REPORT   Patient Name:   Jeffrey Morse Date of Exam: 02/15/2020 Medical Rec #:  [id number]        Height:       70.0 in Accession #:    [id number]       Weight:       211.6 lb Date of Birth:  07/21/1955        BSA:          2.138 m Patient Age:    64 years         BP:           107/92 mmHg Patient Gender: M                HR:           62 bpm. Exam Location:  Inpatient Procedure: Limited Echo and Limited Color Doppler Indications:    Cardiac Arrest I46.9  History:        Patient has no prior history of Echocardiogram examinations.  Sonographer:    Tiffany Dance Referring Phys: [id number] UXLKGMWN A ARIDA  Sonographer Comments: Technically difficult study due to poor echo windows and echo performed with patient  supine and on artificial respirator. IMPRESSIONS  1. Non diagnostic only  para sternal images. Impella device suitable distance distal to AV annulus No pericardial effusion Posterior lateral wall appears hypokinetic No color flow or doppler performed. FINDINGS  Additional Comments: Non diagnostic only para sternal images. Impella device suitable distance distal to AV annulus No pericardial effusion Posterior lateral wall appears hypokinetic No color flow or doppler performed. Charlton Haws MD Electronically signed by Charlton Haws MD Signature Date/Time: 02/15/2020/4:05:49 PM    Final    [place] EKG SITE RITE  Result Date: 02/18/2020 If Site Rite image not attached, placement could not be confirmed due to current cardiac rhythm.   Labs:  Basic Metabolic Panel: Recent Labs  Lab 03/03/20 0632  NA 135  K 4.3  CL 104  CO2 21*  GLUCOSE 100*  BUN 26*  CREATININE 1.02  CALCIUM 9.2    CBC: No results for input(s): WBC, NEUTROABS, HGB, HCT, MCV, PLT in the last 168 hours.  CBG: Recent Labs  Lab 03/07/20 2049 03/08/20 0544 03/08/20 1108 03/08/20 1619 03/08/20 2032  GLUCAP 80 100* 156* 155* 128*   Family history.  Mother with colon cancer Father with CVA.  Denies any esophageal cancer or rectal cancer  Brief HPI:   Jeffrey Morse is a 65 y.o. right-handed male with history of hypertension as well as hyperlipidemia.  Patient lives with spouse independent prior to admission working full-time.  Presented 02/15/2020 with chest pain went to urgent care where he collapsed CPR initiated.  He was in ventricular fibrillation with shock successfully.  CPR was done for approximately 15 minutes.  While in the ambulance he lost pulse again went to PEA arrest CPR resumed.  He did require intubation.  Patient was hypotensive but improved with epinephrine.  Echocardiogram revealed severely reduced LV systolic function no evidence of pericardial effusion.  EKG after CPR showed evidence of inferior ST elevation with lateral involvement suggestive of left circumflex occlusion.  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  Recurrent VT after cardiac cath defibrillated x1 maintained on amiodarone.  Neurology consulted 02/17/2020 for left-sided weakness.  EEG negative.  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  New right thalamic microhemorrhage as compared to scan from 01/18/2012.  MRA was unremarkable.  Currently maintained on aspirin and Plavix for CVA prophylaxis.  Hospital course ileus nasogastric tube placed 02/21/2020 diet slowly advanced.  Findings of elevated hemoglobin A1c 6.0 maintained on Levemir.  Bouts of agitation restlessness required Seroquel.  Patient did have persistent fevers with some question related to right upper extremity thrombus versus pneumonia it did resolve after Precedex stopped so felt to be possibly drug fever.  Venous Doppler studies negative.  Therapy evaluations completed due to patient's decreased functional mobility was admitted for a comprehensive rehab program.   Hospital Course: Jeffrey Morse was admitted to rehab 02/28/2020 for inpatient therapies to consist of PT, ST and OT at least three hours five days a week. Past admission physiatrist, therapy team and rehab RN have worked together to provide customized collaborative inpatient rehab.  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  Patient remained on aspirin Plavix therapy he would follow neurology service well as cardiology.  No further chest pain or shortness of breath.  Pain managed with use of oxycodone as needed.  Mood stabilization with melatonin as well as Rozerem valproate was discontinued 03/07/2020  per heart failure team.  Initially on Seroquel for agitation again later discontinued patient was participating with therapies.  Acute systolic congestive heart failure continued low-dose Entresto as well as Aldactone and  Coreg.  His Entresto was discontinued 03/08/2020 and initiated ProAmatine.  He exhibited no signs of fluid overload.  New findings diabetes mellitus hemoglobin A1c 6.0 full diabetic teaching completed initially on Lantus insulin changed to Metformin 500 mg daily and patient could follow-up with primary MD..  Hospital course complicated by ileus his diet was slowly advance nasogastric tube is since been removed.  No nausea vomiting   Blood pressures were monitored on TID basis and soft and monitored  Diabetes has been monitored with ac/hs CBG checks and SSI was use prn for tighter BS control.    Rehab course: During patient's stay in rehab weekly team conferences were held to monitor patient's progress, set goals and discuss barriers to discharge. At admission, patient required minimal assist supine to sit moderate assist sit to stand moderate assist 30 feet rolling walker.  Minimal assist upper body dressing minimal assist lower body dressing minimal assist toilet transfers  Physical exam.  Blood pressure 107/74 pulse 75 temperature 97.6 respirations 16 oxygen saturation is 98% room air Constitutional.  Awake alert HEENT Head.  Normocephalic and atraumatic Eyes.  Pupils round and reactive to light no discharge.nystagmus Neck.  Supple nontender no JVD without thyromegaly Cardiac regular rate rhythm any extra sounds or murmur heard Abdomen.  Soft nontender positive bowel sounds without rebound Respiratory effort normal no respiratory distress without wheeze Musculoskeletal.  No rigidity Comments.  Upper extremities 5 -/5 in upper extremities and lower extremities upper extremity muscles tested biceps triceps grip and finger abduction bilaterally Lower extremity muscles hip flexors knee extension dorsiflexion and plantar flexion bilaterally Neurologic.  Awake alert no acute distress provides his name date of birth needed some cues for place.  He/She  has had improvement in activity tolerance,  balance, postural control as well as ability to compensate for deficits. He/She has had improvement in functional use RUE/LUE  and RLE/LLE as well as improvement in awareness.  Supine to sit edge of bed supervision.  Dynamic gait training 300 feet without assistive device contact-guard assist for safety.  Patient is only intermittent unsteadiness.  Ascended and descended 12 stairs using bilateral rails contact-guard assist.  He was able to ambulate to his room without assistive device with supervision to the toilet shower and to the chair at side of bed.  He completed all toileting tasks with supervision as well as all bathing and dressing.  Full family teaching completed plan discharged home       Disposition: Discharged to home    Diet: Regular  Special Instructions: No driving smoking or alcohol  Medications at discharge 1.  Tylenol as needed 2.  Aspirin 81 mg p.o. daily 3.  Coreg 3.125 mg p.o. twice daily 4.  Plavix 75 mg p.o. daily 5.  Lexapro 20 mg p.o. daily 6.  Metformin 500 mg daily 7.  Multivitamin daily 8.  Protonix 40 mg p.o. daily 9.  Crestor 40 mg p.o. daily 10.  ProAmatine 2.5 mg 3 times daily 11.  Aldactone 12.5 mg p.o. daily   30-35 minutes were spent completing discharge summary and discharge planning  Discharge Instructions    Ambulatory referral to Neurology   Complete by: As directed    An appointment is requested in approximately 4 weeks multiinfarct frontal lobe, right thalamic microhemorrhage   Ambulatory referral to Physical  Medicine Rehab   Complete by: As directed    Moderate complexity follow-up 1 to 2 weeks multi-infarct right frontal lobe, right thalamic microhemorrhage       Follow-up Information    Kirsteins, Victorino Sparrow, MD Follow up.   Specialty: Physical Medicine and Rehabilitation Why: Call for appointment Contact information: [address] [place] [address] [phone]        Bensimhon, Bevelyn Buckles, MD Follow up.    Specialty: Cardiology Why: Call for appointment Contact information: [address] [place] [address] [phone]               Signed: Charlton Amor 03/09/2020, 5:20 AM

## 2020-03-07 NOTE — Progress Notes (Signed)
Patient ID: VIRGAL WARMUTH, male   DOB: [birth year], 65 y.o.   MRN: [id number] Team Conference Report to Patient/Family  Team Conference discussion was reviewed with the patient and caregiver, including goals, any changes in plan of care and target discharge date.  Patient and caregiver express understanding and are in agreement.  The patient has a target discharge date of 03/09/20.  Andria Rhein 03/07/2020, 1:59 PM

## 2020-03-07 NOTE — Progress Notes (Signed)
Occupational Therapy Session Note  Patient Details  Name: Jeffrey Morse MRN: [id number] Date of Birth: Jun 17, 1955  Today's Date: 03/07/2020 OT Individual Time: 2774-1287 OT Individual Time Calculation (min): 41 min    Short Term Goals: Week 1:  OT Short Term Goal 1 (Week 1): Pt will demonstrate improved activity tolerance to be able to complete a shower without a drop in blood pressure. OT Short Term Goal 2 (Week 1): Pt will be able to don a shirt with no cues demonstrating improved cognition. OT Short Term Goal 3 (Week 1): Pt will be able to don pants with CGA. OT Short Term Goal 4 (Week 1): Pt will demonstrate improved standing balance and awareness of L foot placement during toileting tasks with CGA.  Skilled Therapeutic Interventions/Progress Updates:    Pt worked on toileting, showering, and dressing during session.  He was able to ambulate in the room without an assistive device with supervision to the toilet, shower, and to the chair at side of the bed.  He completed all toileting tasks with supervision as well as all bathing and dressing.  BP in sitting was checked at 109/73 without TEDs or abdominal binder.  Pt's spouse present throughout session and was educated on sequencing for bathing and to have the pt have to problem solve items needed for ADL tasks as well as coordination activities that can be completed for the LUE.  Handout to be provided next session.  He was left sitting in the wheelchair with the call button and phone in reach.  Pt alert and oriented to time this session.    Therapy Documentation Precautions:  Precautions Precautions: Fall Precaution Comments: monitor for orthostatic hypotension Restrictions Weight Bearing Restrictions: No RLE Weight Bearing: Weight bearing as tolerated  Pain: Pain Assessment Pain Scale: Faces Faces Pain Scale: No hurt Pain Type: Chronic pain Pain Location: Back Pain Orientation: Lower Pain Descriptors / Indicators:  Discomfort Pain Onset: With Activity Pain Intervention(s): Repositioned;Shower ADL: See Care Tool Section for some details of mobility and selfcare  Therapy/Group: Individual Therapy  Rolan Wrightsman OTR/L 03/07/2020, 10:39 AM

## 2020-03-07 NOTE — Progress Notes (Addendum)
[facility] PHYSICAL MEDICINE & REHABILITATION PROGRESS NOTE   Subjective/Complaints:  Discussed meds with pt and wife poor sleep despite rozerem and melatonin  Pt has tried [place] in past and ok to try this again  ROS- neg SOB,N/V/D, +left chest wall pain  Objective:   No results found. No results for input(s): WBC, HGB, HCT, PLT in the last 72 hours. No results for input(s): NA, K, CL, CO2, GLUCOSE, BUN, CREATININE, CALCIUM in the last 72 hours.  Intake/Output Summary (Last 24 hours) at 03/07/2020 0751 Last data filed at 03/06/2020 1827 Gross per 24 hour  Intake 594 ml  Output --  Net 594 ml        Physical Exam: Vital Signs Blood pressure 108/75, pulse 68, temperature 98.3 F (36.8 C), temperature source Oral, resp. rate 16, height 5' 10" (1.778 m), weight 94.1 kg, SpO2 96 %.  General: No acute distress Mood and affect are appropriate Heart: Regular rate and rhythm no rubs murmurs or extra sounds Lungs: Clear to auscultation, breathing unlabored, no rales or wheezes Abdomen: Positive bowel sounds, soft nontender to palpation, nondistended Extremities: No clubbing, cyanosis, or edema Skin: No evidence of breakdown, no evidence of rash  Sensory exam normal sensation to light touch and proprioception in bilateral upper and lower extremities  Musculoskeletal: Full range of motion in all 4 extremities. No joint swelling    Assessment/Plan: 1. Functional deficits which require 3+ hours per day of interdisciplinary therapy in a comprehensive inpatient rehab setting.  Physiatrist is providing close team supervision and 24 hour management of active medical problems listed below.  Physiatrist and rehab team continue to assess barriers to discharge/monitor patient progress toward functional and medical goals  Care Tool:  Bathing    Body parts bathed by patient: Right arm,Left arm,Chest,Abdomen,Front perineal area,Buttocks,Right upper leg,Left upper leg,Right lower  leg,Left lower leg,Face         Bathing assist Assist Level: Supervision/Verbal cueing     Upper Body Dressing/Undressing Upper body dressing   What is the patient wearing?: Pull over shirt    Upper body assist Assist Level: Supervision/Verbal cueing    Lower Body Dressing/Undressing Lower body dressing      What is the patient wearing?: Underwear/pull up,Pants     Lower body assist Assist for lower body dressing: Supervision/Verbal cueing     Toileting Toileting    Toileting assist Assist for toileting: Supervision/Verbal cueing     Transfers Chair/bed transfer  Transfers assist     Chair/bed transfer assist level: Supervision/Verbal cueing     Locomotion Ambulation   Ambulation assist      Assist level: Contact Guard/Touching assist Assistive device: No Device Max distance: 347f   Walk 10 feet activity   Assist     Assist level: Contact Guard/Touching assist Assistive device: No Device   Walk 50 feet activity   Assist    Assist level: Contact Guard/Touching assist Assistive device: No Device    Walk 150 feet activity   Assist Walk 150 feet activity did not occur: Safety/medical concerns  Assist level: Contact Guard/Touching assist Assistive device: No Device    Walk 10 feet on uneven surface  activity   Assist Walk 10 feet on uneven surfaces activity did not occur: Safety/medical concerns         Wheelchair     Assist Will patient use wheelchair at discharge?: No             Wheelchair 50 feet with 2 turns activity  Assist            Wheelchair 150 feet activity     Assist          Blood pressure 108/75, pulse 68, temperature 98.3 F (36.8 C), temperature source Oral, resp. rate 16, height 5' 10" (1.778 m), weight 94.1 kg, SpO2 96 %.    Medical Problem List and Plan: 1.  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 Formulized individual treatment plan based on medical history, underlying problem and comorbidities.  2.  Antithrombotics: -DVT/anticoagulation: SCDs             -antiplatelet therapy: Aspirin 81 mg daily, Plavix 75 mg daily- discussed that this is for CVA prevention  3. Pain Management: Continue Oxycodone as needed 4. Mood:  Rozerem 8 mg nightly, melatonin 3 mg nightly as needed. Valproate d/ced 2/2 heart failure team.              -antipsychotic agents: Seroquel 50 mg nightly- d/ced by HF team. Mood is currently stable.  5. Neuropsych: This patient is not capable of making decisions on his own behalf. 6. Skin/Wound Care: Routine skin checks, occipital decubitus, avoid pressure no dressing needed at this time 7. Fluids/Electrolytes/Nutrition: Routine in and outs with follow-up chemistries 8.  Acute systolic congestive heart failure.  Continue low-dose Entresto 24-26 mg twice daily, Aldactone 25 mg daily and Coreg 3.125 mg twice daily  1/31: Appreciate heart failure team following, note reviewed.  Vitals:   03/07/20 0455 03/07/20 0749  BP: (!) 102/56 108/75  Pulse: 65 68  Resp: 16   Temp: 98.3 F (36.8 C)   SpO2: 96%   BPs a little soft but asymptomatic , coreg added back per CHF team, now off Jadiance 9.  Ileus.  Dysphagia-  Cortrak placed 02/21/2020 hoping to remove soon.  Diet slowly advanced. Get Calorie count x 24 hours and see if can remove Cortrak. Ate 100% meal x 1 , fluid intake is low  10.  Diabetes mellitus.  Hemoglobin A1c 6.0.   CHF team to determine if Jardiance will be restarted, wean Lantus ito 7 U tonight  CBG (last 3)  Recent Labs    03/06/20 1624 03/06/20 2104 03/07/20 0559  GLUCAP 110*  135* 107*   Controlled  LOS: 8 days A FACE TO FACE EVALUATION WAS PERFORMED  Luanna Salk  03/07/2020, 7:51 AM

## 2020-03-08 LAB — GLUCOSE, CAPILLARY
Glucose-Capillary: 100 mg/dL — ABNORMAL HIGH (ref 70–99)
Glucose-Capillary: 156 mg/dL — ABNORMAL HIGH (ref 70–99)
Glucose-Capillary: 155 mg/dL — ABNORMAL HIGH (ref 70–99)
Glucose-Capillary: 128 mg/dL — ABNORMAL HIGH (ref 70–99)

## 2020-03-08 MED ORDER — MIDODRINE HCL 2.5 MG PO TABS: 2.5000 mg | ORAL_TABLET | Freq: Three times a day (TID) | ORAL | 0 refills | Status: DC

## 2020-03-08 MED ORDER — CARVEDILOL 3.125 MG PO TABS: 3.1250 mg | ORAL_TABLET | Freq: Two times a day (BID) | ORAL | 0 refills | Status: DC

## 2020-03-08 MED ORDER — MIDODRINE HCL 5 MG PO TABS
2.5000 mg | ORAL_TABLET | Freq: Three times a day (TID) | ORAL | Status: DC
  Administered 2020-03-08 – 2020-03-09 (×3): 2.5 mg via ORAL
  Filled 2020-03-08 (×3): qty 1

## 2020-03-08 MED ORDER — ESCITALOPRAM OXALATE 20 MG PO TABS: 20.0000 mg | ORAL_TABLET | Freq: Every day | ORAL | 0 refills | Status: DC

## 2020-03-08 MED ORDER — ACETAMINOPHEN 325 MG PO TABS: 650.0000 mg | ORAL_TABLET | ORAL | Status: AC | PRN

## 2020-03-08 MED ORDER — OMEPRAZOLE 20 MG PO CPDR: 20.0000 mg | DELAYED_RELEASE_CAPSULE | Freq: Every day | ORAL | 0 refills | Status: AC

## 2020-03-08 MED ORDER — ADULT MULTIVITAMIN W/MINERALS CH: 1.0000 | ORAL_TABLET | Freq: Every day | ORAL | Status: DC

## 2020-03-08 MED ORDER — ROSUVASTATIN CALCIUM 40 MG PO TABS: 40.0000 mg | ORAL_TABLET | Freq: Every day | ORAL | 0 refills | Status: DC

## 2020-03-08 MED ORDER — CLOPIDOGREL BISULFATE 75 MG PO TABS: 75.0000 mg | ORAL_TABLET | Freq: Every day | ORAL | 0 refills | Status: DC

## 2020-03-08 MED ORDER — SACUBITRIL-VALSARTAN 24-26 MG PO TABS: 1.0000 | ORAL_TABLET | Freq: Two times a day (BID) | ORAL | 0 refills | Status: DC

## 2020-03-08 MED ORDER — SPIRONOLACTONE 25 MG PO TABS: 12.5000 mg | ORAL_TABLET | Freq: Every day | ORAL | 0 refills | Status: DC

## 2020-03-08 NOTE — Progress Notes (Signed)
Occupational Therapy Discharge Summary  Patient Details  Name: Jeffrey Morse MRN: [id number] Date of Birth: [birth year]  Today's Date: 03/08/2020 OT Individual Time: 7681-1572 OT Individual Time Calculation (min): 59 min  Session Note:  Pt in bed to start session.  He was able to transition to sitting with independence for donning his TEDs and gripper socks.  BP was taken in sitting without TEDs or binder at 102/70 and in standing at 87/67.  TEDs and binder donned with BP still staying low at 86/75 in standing.  MD made aware of this, however pt with very little symptoms of light headedness or dizziness.  Had him complete toilet transfer with supervision and then oral hygiene in standing at the sink with independence.  He ambulated down to the therapy gym without an assistive device and supervision.  He was issued a small therapy ball for work on LUE coordination working on tossing and catching the ball back and forth from right to left.  Occasional drops noted with pt ambulating and picking it up from the floor with supervision.  Returned to the room at conclusion of activity with pt's spouse present.  Issued FM coordination handout to work on at home with the LUE.  Re-emphasized the need for her to provide direct supervision with all mobility, shower transfers, toileting at home initially.  She voiced understanding.  Call button and phone in reach with pt resting in the bedside chair.    Patient has met 12 of 12 long term goals due to improved activity tolerance, improved balance, ability to compensate for deficits, functional use of  LEFT upper extremity, improved awareness and improved coordination.  Patient to discharge at overall Supervision level.  Patient's care partner is independent to provide the necessary physical and cognitive assistance at discharge.    Reasons goals not met: NA  Recommendation:  Patient will benefit from ongoing skilled OT services in outpatient setting to continue to  advance functional skills in the area of BADL, iADL and Reduce care partner burden.  Pt continues to exhibit some deficits with LUE FM coordination although mild, as well as decreased dynamic standing balance for ADL tasks, and decreased short term memory.  Feel he will benefit from outpatient OT to progress to independent level and hopefully return to work .    Equipment: No equipment provided  Reasons for discharge: treatment goals met and discharge from hospital  Patient/family agrees with progress made and goals achieved: Yes  OT Discharge Precautions/Restrictions  Precautions Precautions: Fall;Other (comment) Precaution Comments: monitor for orthostatic hypotension Restrictions Weight Bearing Restrictions: No   Vital Signs Therapy Vitals Temp: 97.9 F (36.6 C) Temp Source: Oral Pulse Rate: 70 Resp: 17 BP: 88/70 Patient Position (if appropriate): Lying Oxygen Therapy SpO2: 98 % O2 Device: Room Air Pain Pain Assessment Pain Scale: Faces Pain Score: 0-No pain ADL ADL Eating: Independent Where Assessed-Eating: Chair Grooming: Independent Where Assessed-Grooming: Standing at sink Upper Body Bathing: Supervision/safety Where Assessed-Upper Body Bathing: Chair,Shower Lower Body Bathing: Supervision/safety Where Assessed-Lower Body Bathing: Shower,Chair Upper Body Dressing: Independent Where Assessed-Upper Body Dressing: Chair Lower Body Dressing: Modified independent Where Assessed-Lower Body Dressing: Chair Toileting: Supervision/safety Where Assessed-Toileting: Glass blower/designer: Close supervision Armed forces technical officer Method: Counselling psychologist: Raised toilet seat Tub/Shower Transfer: Close supervison Clinical cytogeneticist Method: Optometrist: Civil engineer, contracting with back Social research officer, government: Close supervision Social research officer, government Method: Heritage manager: Civil engineer, contracting with back Vision Baseline  Vision/History: Wears glasses Wears Glasses: Reading only Patient Visual  Report: No change from baseline Vision Assessment?: Yes Eye Alignment: Within Functional Limits Ocular Range of Motion: Within Functional Limits Alignment/Gaze Preference: Within Defined Limits Tracking/Visual Pursuits: Able to track stimulus in all quads without difficulty Saccades: Within functional limits Visual Fields: No apparent deficits Perception  Perception: Within Functional Limits Praxis Praxis: Intact Cognition Overall Cognitive Status: Impaired/Different from baseline Arousal/Alertness: Awake/alert Attention: Focused Focused Attention: Appears intact Sustained Attention: Appears intact Selective Attention: Appears intact Memory: Impaired Memory Impairment: Decreased short term memory;Decreased recall of new information Decreased Short Term Memory: Verbal complex Awareness Impairment: Anticipatory impairment Sensation Sensation Light Touch: Appears Intact Hot/Cold: Appears Intact Proprioception: Appears Intact Stereognosis: Appears Intact Coordination Gross Motor Movements are Fluid and Coordinated: Yes Fine Motor Movements are Fluid and Coordinated: No Coordination and Movement Description: Slight decreased FM in the LUE compared to the right with nine hole peg test demonstrating approximately 5 second difference. Motor  Motor Motor: Hemiplegia Motor - Discharge Observations: mild left hemiparesis Mobility  Bed Mobility Bed Mobility: Supine to Sit;Sit to Supine Supine to Sit: Independent Sit to Supine: Independent Transfers Sit to Stand: Independent Stand to Sit: Independent  Trunk/Postural Assessment  Cervical Assessment Cervical Assessment: Within Functional Limits Thoracic Assessment Thoracic Assessment: Within Functional Limits Lumbar Assessment Lumbar Assessment: Within Functional Limits  Balance Balance Balance Assessed: Yes Static Sitting Balance Static Sitting -  Balance Support: Feet supported Static Sitting - Level of Assistance: 7: Independent Dynamic Sitting Balance Dynamic Sitting - Balance Support: Feet supported Dynamic Sitting - Level of Assistance: 7: Independent Static Standing Balance Static Standing - Balance Support: During functional activity Static Standing - Level of Assistance: 7: Independent Dynamic Standing Balance Dynamic Standing - Balance Support: During functional activity Dynamic Standing - Level of Assistance: 5: Stand by assistance Extremity/Trunk Assessment RUE Assessment RUE Assessment: Within Functional Limits LUE Assessment LUE Assessment: Exceptions to [facility] Active Range of Motion (AROM) Comments: WFLS General Strength Comments: shoulder, elbow, and grip 4/5 throughout.  Slight decreased FM coordination noted with manipulation of objects.   Regina Ganci 03/08/2020, 4:40 PM

## 2020-03-08 NOTE — Progress Notes (Signed)
Occupational Therapy Note  Patient Details  Name: Jeffrey Morse MRN: [id number] Date of Birth: 1955-05-16  Pt seen for OT this am.  BP taken in sitting without abdominal binder and TEDs at 102/70.  In standing it decreased downs to 87/67.  Donned knee length TEDs to check again with BP at 88/70 in standing.  Thigh high TEDs were donned instead of knee length TEDs and it still decreased to 78/53 with standing.  With abdominal binder and TEDs it was 95/74 in sitting and decreased to 86/75 in standing.  He reported slight light headedness with initial standing but it went away and did not impact session once TEDs and abdominal binder were in place.     Cristle Jared OTR/L 03/08/2020, 12:08 PM

## 2020-03-08 NOTE — Progress Notes (Signed)
Jeffrey [facility] PHYSICAL MEDICINE & REHABILITATION PROGRESS NOTE   Subjective/Complaints:  No issues overnight slept much better , discussed trial without TEDs today   ROS- neg SOB,N/V/D, +left chest wall pain  Objective:   No results found. No results for input(s): WBC, HGB, HCT, PLT in the last 72 hours. No results for input(s): NA, K, CL, CO2, GLUCOSE, BUN, CREATININE, CALCIUM in the last 72 hours.  Intake/Output Summary (Last 24 hours) at 03/08/2020 0757 Last data filed at 03/07/2020 1842 Gross per 24 hour  Intake 720 ml  Output -  Net 720 ml        Physical Exam: Vital Signs Blood pressure 93/65, pulse 72, temperature 98.2 F (36.8 C), resp. rate 16, height 5\' 10"  (1.778 m), weight 94.1 kg, SpO2 96 %.  General: No acute distress Mood and affect are appropriate Heart: Regular rate and rhythm no rubs murmurs or extra sounds Lungs: Clear to auscultation, breathing unlabored, no rales or wheezes Abdomen: Positive bowel sounds, soft nontender to palpation, nondistended Extremities: No clubbing, cyanosis, or edema Skin: No evidence of breakdown, no evidence of rash  Sensory exam normal sensation to light touch and proprioception in bilateral upper and lower extremities  Musculoskeletal: Full range of motion in all 4 extremities. No joint swelling    Assessment/Plan: 1. Functional deficits which require 3+ hours per day of interdisciplinary therapy in a comprehensive inpatient rehab setting.  Physiatrist is providing close team supervision and 24 hour management of active medical problems listed below.  Physiatrist and rehab team continue to assess barriers to discharge/monitor patient progress toward functional and medical goals  Care Tool:  Bathing    Body parts bathed by patient: Right arm,Left arm,Chest,Abdomen,Front perineal area,Buttocks,Right upper leg,Left upper leg,Right lower leg,Left lower leg,Face         Bathing assist Assist Level: Supervision/Verbal  cueing     Upper Body Dressing/Undressing Upper body dressing   What is the patient wearing?: Pull over shirt    Upper body assist Assist Level: Supervision/Verbal cueing    Lower Body Dressing/Undressing Lower body dressing      What is the patient wearing?: Underwear/pull up,Pants     Lower body assist Assist for lower body dressing: Supervision/Verbal cueing     Toileting Toileting    Toileting assist Assist for toileting: Supervision/Verbal cueing     Transfers Chair/bed transfer  Transfers assist     Chair/bed transfer assist level: Supervision/Verbal cueing     Locomotion Ambulation   Ambulation assist      Assist level: Supervision/Verbal cueing Assistive device: No Device Max distance: 1,076ft   Walk 10 feet activity   Assist     Assist level: Supervision/Verbal cueing Assistive device: No Device   Walk 50 feet activity   Assist    Assist level: Supervision/Verbal cueing Assistive device: No Device    Walk 150 feet activity   Assist Walk 150 feet activity did not occur: Safety/medical concerns  Assist level: Supervision/Verbal cueing Assistive device: No Device    Walk 10 feet on uneven surface  activity   Assist Walk 10 feet on uneven surfaces activity did not occur: Safety/medical concerns   Assist level: Contact Guard/Touching assist     Wheelchair     Assist Will patient use wheelchair at discharge?: No             Wheelchair 50 feet with 2 turns activity    Assist            Wheelchair 150 feet  activity     Assist          Blood pressure 93/65, pulse 72, temperature 98.2 F (36.8 C), resp. rate 16, height 5\' 10"  (1.778 m), weight 94.1 kg, SpO2 96 %.    Medical Problem List and Plan: 1.  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  Antithrombotics: -DVT/anticoagulation: SCDs             -antiplatelet therapy: Aspirin 81 mg daily, Plavix 75 mg daily- discussed that this is for CVA prevention  3. Pain Management: Continue Oxycodone as needed 4. Mood:  lexapro             -antipsychotic agents: none5. Neuropsych: This patient is not capable of making decisions on his own behalf. 6. Skin/Wound Care: Routine skin checks, occipital decubitus, avoid pressure no dressing needed at this time 7. Fluids/Electrolytes/Nutrition: Routine in and outs with follow-up chemistries 8.  Acute systolic congestive heart failure.  Continue low-dose Entresto 24-26 mg twice daily, Aldactone 25 mg daily and Coreg 3.125 mg twice daily  1/31: Appreciate heart failure team following, note reviewed.  Vitals:   03/07/20 1930 03/08/20 0446  BP: 98/67 93/65  Pulse: 64 72  Resp: 20 16  Temp: 98.4 F (36.9 C) 98.2 F (36.8 C)  SpO2: 98% 96%  BPs a little soft but asymptomatic , coreg added back per CHF team, now off Jadiance 9.  Ileus.  resolved  10.  Diabetes mellitus.  Hemoglobin A1c 6.0.   CHF team to determine if Jardiance will be restarted, wean Lantus ito 7 U tonight  CBG (last 3)  Recent Labs    03/07/20 1641 03/07/20 2049 03/08/20 0544  GLUCAP 173* 80 100*   Controlled 2/3 LOS: 9 days A FACE TO FACE EVALUATION WAS PERFORMED  05/06/20 03/08/2020, 7:57 AM

## 2020-03-08 NOTE — Progress Notes (Signed)
Patient ID: Jeffrey Morse, male   DOB: 12-Mar-1955, 65 y.o.   MRN: [id number]   Handicapped placement card provided to patient/spouse. D/c information discussed.   [place], [place] [phone]

## 2020-03-08 NOTE — Evaluation (Signed)
Recreational Therapy Assessment and Plan  Patient Details  Name: Jeffrey Morse MRN: [id number] Date of Birth: [birth year] Today's Date: 03/08/2020  Rehab Potential:  Good ELOS:   d/c 03/09/20 Assessment  Hospital Problem: Principal Problem:   Ischemic cerebrovascular accident (CVA) of frontal lobe ([place])   Past Medical History:      Past Medical History:  Diagnosis Date  . HTN (hypertension)   . Hypercholesteremia   . Hypertension    Borderline hypertension  . Sleep apnea   . Stroke [facility])    Past Surgical History:       Past Surgical History:  Procedure Laterality Date  . BACK SURGERY    . CARDIAC CATHETERIZATION    . CORONARY STENT INTERVENTION N/A 02/15/2020   Procedure: CORONARY STENT INTERVENTION;  Surgeon: Wellington Hampshire, MD;  Location: [facility];  Service: Cardiovascular;  Laterality: N/A;  CFX  . KNEE SURGERY Left 2012  . LEFT HEART CATH AND CORONARY ANGIOGRAPHY N/A 02/15/2020   Procedure: LEFT HEART CATH AND CORONARY ANGIOGRAPHY;  Surgeon: Wellington Hampshire, MD;  Location: [facility];  Service: Cardiovascular;  Laterality: N/A;  . RIGHT HEART CATH N/A 02/15/2020   Procedure: RIGHT HEART CATH;  Surgeon: Wellington Hampshire, MD;  Location: [facility];  Service: Cardiovascular;  Laterality: N/A;  . VENTRICULAR ASSIST DEVICE INSERTION N/A 02/15/2020   Procedure: VENTRICULAR ASSIST DEVICE INSERTION;  Surgeon: Wellington Hampshire, MD;  Location: [facility];  Service: Cardiovascular;  Laterality: N/A;  iMPELLA     Assessment & Plan Clinical Impression: Patient is a 65 y.o. right-handed male with history of hypertension as well as hyperlipidemia. Per chart review lives with spouse independent prior to admission working full-time. 1 level home 3 steps to entry. Presented 02/15/2020 with chest pain and went to the urgent care where he collapsed and CPR was initiated. He was in ventricular fibrillation and was shocked successfully.  CPR was done for approximately 15 minutes. While in the ambulance he lost pulse again went to PEA arrest CPR resumed. He did require intubation. Patient was hypotensive but improved with epinephrine. Echocardiogram revealed severely reduced LV systolic function no evidence of pericardial effusion. EKG after CPR showed evidence of inferior ST elevation with lateral involvement suggestive of left circumflex occlusion. 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 Recurrent Vt after cardiac cath defibrillated x1 maintained on amiodarone. Neurology consulted 02/17/2020 for left-sided weakness. EEG negative. 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 New right thalamic microhemorrhage as compared to scan from 01/18/2012. MRA was unremarkable. Currently maintained on aspirin and Plavix for CVA prophylaxis. Hospital course ileus and cortrak placed 02/21/2020 diet slowly advanced dysphagia #2 thin liquids. Findings of elevated hemoglobin A1c 6.0 currently maintained on Levemir. Bouts of agitation and restlessness maintained on Seroquel as well as valproic acid. Patient did have persistent fevers there was some question related to right upper extremity thrombus versus pneumonia it did resolve after Precedex stop so felt to be possibly drug fever. Venous Doppler study was negative. Therapy evaluations completed due to patient decreased functional mobility left side weakness he was admitted for a comprehensive rehab program.  Patient transferred to CIR on 02/28/2020 .   Pt presents with decreased activity tolerance, decreased functional mobility, decreased balance Limiting pt's independence with leisure/community pursuits.   Met with pt and wife today to discuss leisure interests, activity analysis with identification of potential modifications, energy conservation, community  reintegration and coping.  Pt is anxious to discharge  home tomorrow with family and return to previously enjoyed activities.  Lengthy discussion about safety concerns/fall risks and potential leisure discussion.  Pt also with questions about intimacy which he will speak with MD about tomorrow prior to discharge.  Both appreciative of this visit and the education received.   Plan   No further TR as pt is discharging home tomorrow. Recommendations for other services: None   Discharge Criteria: Patient will be discharged from TR if patient refuses treatment 3 consecutive times without medical reason.  If treatment goals not met, if there is a change in medical status, if patient makes no progress towards goals or if patient is discharged from hospital.  The above assessment, treatment plan, treatment alternatives and goals were discussed and mutually agreed upon: by patient  [place] 03/08/2020, 8:49 AM

## 2020-03-08 NOTE — Progress Notes (Addendum)
Advanced Heart Failure Rounding Note  PCP:  Primary Cardiologist:   Subjective:    1/12 admitted for Vfib arrest, cath PCI/DES and impella 1/13 Echo EF ~50-55% LV small RV ok. 1/14 VT arrest, ROSC after 3 minutes.  1/15 Impella removed 1/16 Left sided weakness. MRI with multiple small infarcts and small ICH 1/18 Extubated.  1/22 Developed hyperactive delirium at night, head CT neg, started on valproate and seroquel  1/25 Admitted to CIR cortrak removed  Has done well in CIR, slated for d/c tomorrow.  Denies chest pain, dyspnea or orthopnea.  Mildly orthostatic, luckily with minimal to no symptoms.     Objective:   Weight Range: 94.1 kg Body mass index is 29.77 kg/m.   Vital Signs:   Temp:  [98.1 F (36.7 C)-98.4 F (36.9 C)] 98.2 F (36.8 C) (02/03 0446) Pulse Rate:  [64-78] 72 (02/03 0446) Resp:  [16-20] 16 (02/03 0446) BP: (93-99)/(65-69) 93/65 (02/03 0446) SpO2:  [96 %-98 %] 96 % (02/03 0446) Last BM Date: 03/06/20  Weight change: Filed Weights   02/28/20 1500  Weight: 94.1 kg    Intake/Output:   Intake/Output Summary (Last 24 hours) at 03/08/2020 1246 Last data filed at 03/08/2020 0715 Gross per 24 hour  Intake 946 ml  Output --  Net 946 ml      Physical Exam    Cardiac: JVD flat, normal rate and rhythm, clear s1 and s2, no murmurs, rubs or gallops, no LE edema Pulmonary: CTAB, not in distress Abdominal: non distended abdomen, soft and nontender Psych: Alert, conversant, in good spirits    Telemetry   n/a  EKG    n/a  Labs    CBC No results for input(s): WBC, NEUTROABS, HGB, HCT, MCV, PLT in the last 72 hours. Basic Metabolic Panel No results for input(s): NA, K, CL, CO2, GLUCOSE, BUN, CREATININE, CALCIUM, MG, PHOS in the last 72 hours. Liver Function Tests No results for input(s): AST, ALT, ALKPHOS, BILITOT, PROT, ALBUMIN in the last 72 hours. No results for input(s): LIPASE, AMYLASE in the last 72 hours. Cardiac Enzymes No  results for input(s): CKTOTAL, CKMB, CKMBINDEX, TROPONINI in the last 72 hours.  BNP: BNP (last 3 results) No results for input(s): BNP in the last 8760 hours.  ProBNP (last 3 results) No results for input(s): PROBNP in the last 8760 hours.   D-Dimer No results for input(s): DDIMER in the last 72 hours. Hemoglobin A1C No results for input(s): HGBA1C in the last 72 hours. Fasting Lipid Panel No results for input(s): CHOL, HDL, LDLCALC, TRIG, CHOLHDL, LDLDIRECT in the last 72 hours. Thyroid Function Tests No results for input(s): TSH, T4TOTAL, T3FREE, THYROIDAB in the last 72 hours.  Invalid input(s): FREET3  Other results:   Imaging    No results found.   Medications:     Scheduled Medications: . aspirin  81 mg Oral Daily  . carvedilol  3.125 mg Oral BID WC  . clopidogrel  75 mg Oral Daily  . escitalopram  20 mg Oral Daily  . feeding supplement  237 mL Oral BID BM  . insulin aspart  0-15 Units Subcutaneous TID AC & HS  . insulin glargine  7 Units Subcutaneous QHS  . midodrine  2.5 mg Oral TID WC  . multivitamin with minerals  1 tablet Oral Daily  . nystatin  5 mL Mouth/Throat QID  . pantoprazole  40 mg Oral Daily  . rosuvastatin  40 mg Oral Daily  . sacubitril-valsartan  1 tablet  Oral BID  . spironolactone  12.5 mg Oral Daily    Infusions:   PRN Medications: acetaminophen **OR** [DISCONTINUED] acetaminophen (TYLENOL) oral liquid 160 mg/5 mL **OR** [DISCONTINUED] acetaminophen, Muscle Rub, zolpidem     Assessment/Plan   1. Cardiac (VTarrest in setting of acute inferolateral STEMI/VT - OOH cardiac arrest on 1/12 with prolonged CPR, resuscitated w/ CPR + defibrillation  - Emergent cath 1/12 w/ totally occluded mid LCx treated w/ PCI + DES - Recurrent VT on 1/14 -> defib x 1 - Off amiodarone  - Supp K PRN - Mag stable.  - continue low dose carvedilol  2. Acute systolic HF due in setting of acute MI -> cardiogenic shock - EF 25% in cath lab 1/12 -  Echo 1/13 EF 50-55% - Impella pulled 1/14, CVP 5-7 CO-OX remains stable. - Renal function stable.  - Continue low dose entresto, carvedilol, spironolactone 12.5mg      3. CAD with acute inferolateral  STEMI  - Emergent cath 1/12 w/ totally occluded mid LCx treated w/ PCI + DES -There is moderate ostial LAD disease and significant disease in the distal LAD close to the apex. No significant disease affecting the right coronary artery. - Continue DAPT. Restarted statin - Brilinta switched to Plavix; Plavix therapeutic per P2y12 lab.  - continue low dose carvedilol  4. Acute CVA with ICH - left-sided weakness --> now not following commands; repeat CT shows stable hemorrhage - MRI with multiple small infarcts and small ICH (Likely cardioembolic from arrest) - Brilinta switched to Plavix - L sided weakness and coordination doing extremely well, continues to make progress  5. Orthostatic Hypotension -start midodrine 2.5mg  TID can likely d/c shortly in outpatient setting  -continue ted hose  6. DM2 - HgbA1c 6.0 - well controlled, continue basal bolus insulin - continue holding farxiga due to orthostatic hypotension   Length of Stay: 9   Angelita Ingles, MD  03/08/2020, 12:46 PM   Patient seen and examined with the above-signed Advanced Practice Provider and/or Housestaff. I personally reviewed laboratory data, imaging studies and relevant notes. I independently examined the patient and formulated the important aspects of the plan. I have edited the note to reflect any of my changes or salient points. I have personally discussed the plan with the patient and/or family.  Making good progress with CIR. Remains mildly orthostatic.   General:  Well appearing. No resp difficulty HEENT: normal Neck: supple. no JVD. Carotids 2+ bilat; no bruits. No lymphadenopathy or thryomegaly appreciated. Cor: PMI nondisplaced. Regular rate & rhythm. No rubs, gallops or murmurs. Lungs:  clear Abdomen: soft, nontender, nondistended. No hepatosplenomegaly. No bruits or masses. Good bowel sounds. Extremities: no cyanosis, clubbing, rash, edema Neuro: alert & orientedx3, cranial nerves grossly intact. moves all 4 extremities w/o difficulty. Affect pleasant  Will stop Entresto. Add lose dose midodrine. We will see in am prior to d/c.   Arvilla Meres, MD  7:26 PM

## 2020-03-08 NOTE — Progress Notes (Signed)
Speech Language Pathology Discharge Summary  Patient Details  Name: Jeffrey Morse MRN: [id number] Date of Birth: [birth year]  Today's Date: 03/08/2020 SLP Individual Time: 0915-0955 SLP Individual Time Calculation (min): 40 min   Skilled Therapeutic Interventions:  Pt was seen for skilled ST targeting dysphagia goals and finishing education with pt and his wife. Pt was finishing his regular texture breakfast tray upon arrival, demonstrating Mod I use and verbal recall of his compensatory safe swallowing strategies. Pt with no overt s/sx aspiration throughout intake. Reviewed strategies with wife to reinforce use at home. Recommend continue current diet. Also discussed compensatory memory strategies to maximize recall and carryover of new information and therapeutic techniques at discharge. Explicit recommendations for 24/7 supervision and supervision level assistance with medication management reviewed with pt and his wife, which they acknowledged. SLP also discussed decreased awareness and impact on his self-perception of progress and levels of functioning in context of acute CVA. Pt demonstrated ability to recall fall prevention and recovery techniques discussed with his PT yesterday with Supervision A level question cues. All questions were answered to pt and his wife's satisfaction. Pt left laying in bed, needs within reach, wife still present. Continue per current plan of care.     Patient has met 5 of 5 long term goals.  Patient to discharge at overall Supervision;Modified Independent level.  Reasons goals not met: n/a   Clinical Impression/Discharge Summary:   Pt made functional gains and met 5 out of 5 long term goals this admission. Pt currently requires Supervision assist for semi-complex to complex tasks due to mild cognitive impairments impacting his short term memory, complex problem solving, and emergent and anticipatory awareness. As a result, he will require 24/7 supervision at  discharge. Pt is consuming an upgraded regular textures heart healthy diet with thin liquids and is Mod I for use of his compensatory safe swallowing strategies. Pt has demonstrated improved swallow function and overall functional cognitive abilities and use of compensatory strategies to optimize attention, recall, and carryover of new information. Pt and his wife report their perception that pt is back to baseline level of cognitive function. Therefore, no additional follow up ST services are indicated. Pt and family education has been ongoing and is complete at this time.   Care Partner:  Caregiver Able to Provide Assistance: Yes  Type of Caregiver Assistance: Cognitive;Physical  Recommendation:  24 hour supervision/assistance  Rationale for SLP Follow Up: Other (comment) (n/a)   Equipment: none   Reasons for discharge: Discharged from hospital   Patient/Family Agrees with Progress Made and Goals Achieved: Yes    Arbutus Leas 03/08/2020, 9:52 AM

## 2020-03-08 NOTE — Discharge Instructions (Signed)
Inpatient Rehab Discharge Instructions  Jeffrey Morse Discharge date and time: No discharge date for patient encounter.   Activities/Precautions/ Functional Status: Activity: activity as tolerated Diet: Regular consistency Wound Care: Routine skin checks Functional status:  ___ No restrictions     ___ Walk up steps independently ___ 24/7 supervision/assistance   ___ Walk up steps with assistance ___ Intermittent supervision/assistance  ___ Bathe/dress independently ___ Walk with walker     _x__ Bathe/dress with assistance ___ Walk Independently    ___ Shower independently ___ Walk with assistance    ___ Shower with assistance ___ No alcohol     ___ Return to work/school ________  COMMUNITY REFERRALS UPON DISCHARGE:    Outpatient: PT     OT               Agency: [facility] Phone: [phone]              Appointment Date/Time: Facility to Determine at Discharge    Special Instructions: No driving smoking or alcohol STROKE/TIA DISCHARGE INSTRUCTIONS SMOKING Cigarette smoking nearly doubles your risk of having a stroke & is the single most alterable risk factor  If you smoke or have smoked in the last 12 months, you are advised to quit smoking for your health.  Most of the excess cardiovascular risk related to smoking disappears within a year of stopping.  Ask you doctor about anti-smoking medications  [place] Quit Line: 1-800-QUIT NOW  Free Smoking Cessation Classes [phone]  CHOLESTEROL Know your levels; limit fat & cholesterol in your diet  Lipid Panel     Component Value Date/Time   CHOL 133 02/18/2020 1032   TRIG 97 02/18/2020 1032   HDL 38 (L) 02/18/2020 1032   CHOLHDL 3.5 02/18/2020 1032   VLDL 19 02/18/2020 1032   LDLCALC 76 02/18/2020 1032      Many patients benefit from treatment even if their cholesterol is at goal.  Goal: Total Cholesterol (CHOL) less than 160  Goal:  Triglycerides (TRIG) less than 150  Goal:  HDL  greater than 40  Goal:  LDL (LDLCALC) less than 100   BLOOD PRESSURE American Stroke Association blood pressure target is less that 120/80 mm/Hg  Your discharge blood pressure is:  BP: 101/73  Monitor your blood pressure  Limit your salt and alcohol intake  Many individuals will require more than one medication for high blood pressure  DIABETES (A1c is a blood sugar average for last 3 months) Goal HGBA1c is under 7% (HBGA1c is blood sugar average for last 3 months)  Diabetes:   Lab Results  Component Value Date   HGBA1C 6.1 (H) 02/18/2020     Your HGBA1c can be lowered with medications, healthy diet, and exercise.  Check your blood sugar as directed by your physician  Call your physician if you experience unexplained or low blood sugars.  PHYSICAL ACTIVITY/REHABILITATION Goal is 30 minutes at least 4 days per week  Activity: Increase activity slowly, Therapies: Physical Therapy: Home Health Return to work:   Activity decreases your risk of heart attack and stroke and makes your heart stronger.  It helps control your weight and blood pressure; helps you relax and can improve your mood.  Participate in a regular exercise program.  Talk with your doctor about the best form of exercise for you (dancing, walking, swimming, cycling).  DIET/WEIGHT Goal is to maintain a healthy weight  Your discharge diet is:  Diet Order  DIET DYS 2 Room service appropriate? Yes with Assist; Fluid consistency: Thin  Diet effective now                 liquids Your height is:    Your current weight is:   Your Body Mass Index (BMI) is:     Following the type of diet specifically designed for you will help prevent another stroke.  Your goal weight range is:    Your goal Body Mass Index (BMI) is 19-24.  Healthy food habits can help reduce 3 risk factors for stroke:  High cholesterol, hypertension, and excess weight.  RESOURCES Stroke/Support Group:  Call [phone]   STROKE  EDUCATION PROVIDED/REVIEWED AND GIVEN TO PATIENT Stroke warning signs and symptoms How to activate emergency medical system (call 911). Medications prescribed at discharge. Need for follow-up after discharge. Personal risk factors for stroke. Pneumonia vaccine given:  Flu vaccine given:  My questions have been answered, the writing is legible, and I understand these instructions.  I will adhere to these goals & educational materials that have been provided to me after my discharge from the hospital.      My questions have been answered and I understand these instructions. I will adhere to these goals and the provided educational materials after my discharge from the hospital.  Patient/Caregiver Signature _______________________________ Date __________  Clinician Signature _______________________________________ Date __________  Please bring this form and your medication list with you to all your follow-up doctor's appointments.

## 2020-03-08 NOTE — Progress Notes (Signed)
Inpatient Rehabilitation Care Coordinator Discharge Note  The overall goal for the admission was met for:   Discharge location: Yes, home  Length of Stay: Yes, 9 Days  Discharge activity level: Yes, Supervision  Home/community participation: Yes  Services provided included: MD, RD, PT, OT, SLP, RN, CM, TR, Pharmacy, Neuropsych and SW  Financial Services: Private Insurance: [place] offered to/list presented [id number]   Follow-up services arranged: Outpatient: Neuro Outpatient   Comments (or additional information): PT OT NO DME  Patient/Family verbalized understanding of follow-up arrangements: Yes  Individual responsible for coordination of the follow-up plan: self or Glenard Haring [phone]  Confirmed correct DME delivered: Dyanne Iha 03/08/2020    Dyanne Iha

## 2020-03-08 NOTE — Progress Notes (Signed)
Physical Therapy Discharge Summary  Patient Details  Name: Jeffrey Morse MRN: [id number] Date of Birth: 11/23/1955  Today's Date: 03/08/2020 PT Individual Time: 1003-1100 and 2595-6387 PT Individual Time Calculation (min): 57 min and 29 min   Patient has met 9 of 9 long term goals due to improved activity tolerance, improved balance, improved postural control, increased strength, ability to compensate for deficits, functional use of  left upper extremity and left lower extremity, improved attention, improved awareness and improved coordination.  Patient to discharge at an ambulatory level, no AD, requiring Supervision. Patient's care partner attended hands-on education/training and is independent to provide the necessary physical and cognitive assistance at discharge.  All goals met.  Recommendation:  Patient will benefit from ongoing skilled PT services in outpatient setting to continue to advance safe functional mobility, address ongoing impairments in dynamic standing balance, cardiovascular endurance, dynamic gait training, and minimize fall risk.  Equipment: No equipment provided, none needed  Reasons for discharge: treatment goals met and discharge from hospital  Patient/family agrees with progress made and goals achieved: Yes  Skilled Therapeutic Interventions/Progress Updates:  Session 1: Pt received lying in bed on stomach with his wife present - pt reports this position helps dissipate his low back "ache." Pt agreeable to therapy session. Session focused on family education/training. Transitioned to sitting EOB independently. Pt wearing B LE thigh high TED hose and donned abdominal binder vitals: BP 89/77 (MAP 83), HR 70bpm in L arm and reassessed in R arm: BP 93/66 (MAP 76), HR 72bpm. Sit<>stands, no AD, with supervision for safety during session. Ambulated ~282f and then reassessed vitals in sitting: BP 108/77 (MAP 88), HR 71bpm with pt denying symptoms throughout session. Had  wife provide pt with close supervision on L side throughout training. Ambulatory simulated car transfer (high SUV height) with pt able to complete with supervision for safety. Ambulated ~157fup/down ramp, ~1030f2 over mulch and up/down curb step with intermittent L HHA from his wife with supervision. Educated pt's wife on proper positioning to provide pt supervision on stairs. Ascended/descended 12 steps using either R HR or B HRs with supervision and reciprocal pattern. Pt/wife asking about abdominal binder and TED hose - educated on having pt wear per MD instruction. Discussed having pt assess BP at home per MD instruction. Discussed outdoor ambulation and recommended pt wear gait belt and having family assist him on L side for safety. Pt/wife reporting no additional questions/concerns and report feeling comfortable/confident with discharge plan. Remainder of therapy session focused on dynamic gait training including the following challenges:  - sudden horizontal and vertical head turns - sudden R/L turns - sudden starts/stops - faster walking - side ways stepping - backwards walking - stepping over obstacles - stepping on/off 4" step - picking up and carrying small objects  - tandem walking Pt able to complete all tasks with CGA for safety, no overt LOB, but requires min assist during tandem ambulation. Gait back to room and pt left sitting EOB with needs in reach and his wife present.  Session 2: Pt received prone in bed with his wife arriving immediately after therapist. Pt agreeable to therapy session. Transitioned to EOB independently. Therapy session focused on reviewing pt's HEP with his wife to ensure correct technique and safe set-up of exercises at home. Sit<>stands distant supervision during session. Gait ~150f41f to/from therapy gym with supervision provided by pt's wife. Performed the following exercises with therapist educating and cuing for proper form: - x5 reps lunges each  LE  -  x5 reps squats in front of mat  - x15 reps heel raises near UE support - semi-tandem stance x30 seconds each LE - narrow BOS vertical head nods x10 reps Pt/wife report feeling confident/comfortable performing these exercises at home. At end of session pt left sitting EOB with his wife present.  PT Discharge Precautions/Restrictions Precautions Precautions: Fall;Other (comment) Precaution Comments: monitor for orthostatic hypotension Restrictions Weight Bearing Restrictions: No Pain Pain Assessment Pain Scale: 0-10 Pain Score: 3  Faces Pain Scale: No hurt Pain Type: Chronic pain Pain Location: Back Pain Orientation: Lower;Medial Pain Descriptors / Indicators: Aching Pain Onset: Other (Comment) (with decreased activity) Pain Intervention(s): Medication (See eMAR);Repositioned;Other (Comment) (pt lies on stomach) Perception  Perception: Within Functional Limits Praxis Praxis: Intact  Cognition Overall Cognitive Status: Impaired/Different from baseline Arousal/Alertness: Awake/alert Orientation Level: Oriented X4 Attention: Focused Focused Attention: Appears intact Sustained Attention: Appears intact Selective Attention: Appears intact Memory:  (able to recall falls risk education without cuing) Safety/Judgment: Appears intact Sensation Sensation Light Touch: Appears Intact Hot/Cold: Not tested Proprioception: Appears Intact Stereognosis: Not tested Coordination Gross Motor Movements are Fluid and Coordinated: Yes Motor  Motor Motor: Other (comment) Motor - Discharge Observations: slight left hemiparesis  Mobility Bed Mobility Bed Mobility: Supine to Sit;Sit to Supine Supine to Sit: Independent Sit to Supine: Independent Transfers Transfers: Sit to Stand;Stand to Sit;Stand Pivot Transfers Sit to Stand: Supervision/Verbal cueing Stand to Sit: Supervision/Verbal cueing Stand Pivot Transfers: Supervision/Verbal cueing Transfer (Assistive device): None Locomotion   Gait Ambulation: Yes Gait Assistance: Supervision/Verbal cueing Gait Distance (Feet): 500 Feet Assistive device: None Gait Gait: Yes Gait Pattern: Within Functional Limits (decreased trunk rotation but otherwise no significant gait impairments noted) Gait velocity: WFL Stairs / Additional Locomotion Stairs: Yes Stairs Assistance: Supervision/Verbal cueing Stair Management Technique: Two rails Number of Stairs: 12 Height of Stairs: 6 Ramp: Supervision/Verbal cueing Curb: Nurse, mental health Mobility: No  Trunk/Postural Assessment  Cervical Assessment Cervical Assessment: Within Functional Limits Thoracic Assessment Thoracic Assessment: Within Functional Limits Lumbar Assessment Lumbar Assessment: Within Functional Limits Postural Control Postural Control: Within Functional Limits  Balance Balance Balance Assessed: Yes Standardized Balance Assessment Standardized Balance Assessment: Berg Balance Test;Functional Gait Assessment Berg Balance Test Sit to Stand: Able to stand without using hands and stabilize independently Standing Unsupported: Able to stand safely 2 minutes Sitting with Back Unsupported but Feet Supported on Floor or Stool: Able to sit safely and securely 2 minutes Stand to Sit: Sits safely with minimal use of hands Transfers: Able to transfer safely, minor use of hands Standing Unsupported with Eyes Closed: Able to stand 10 seconds safely Standing Ubsupported with Feet Together: Able to place feet together independently and stand 1 minute safely From Standing, Reach Forward with Outstretched Arm: Can reach confidently >25 cm (10") From Standing Position, Pick up Object from Floor: Able to pick up shoe safely and easily From Standing Position, Turn to Look Behind Over each Shoulder: Looks behind from both sides and weight shifts well Turn 360 Degrees: Needs close supervision or verbal cueing (minor posterior  lean) Standing Unsupported, Alternately Place Feet on Step/Stool: Able to complete >2 steps/needs minimal assist Standing Unsupported, One Foot in Front: Able to take small step independently and hold 30 seconds Standing on One Leg: Tries to lift leg/unable to hold 3 seconds but remains standing independently Total Score: 45  Static Sitting Balance Static Sitting - Balance Support: Feet supported Static Sitting - Level of Assistance: 7: Independent Dynamic Sitting Balance Dynamic  Sitting - Balance Support: Feet supported Dynamic Sitting - Level of Assistance: 7: Independent Static Standing Balance Static Standing - Balance Support: During functional activity Static Standing - Level of Assistance: 7: Independent Dynamic Standing Balance Dynamic Standing - Balance Support: During functional activity Dynamic Standing - Level of Assistance: 5: Stand by assistance  Functional Gait  Assessment Gait assessed : Yes Gait Level Surface: Walks 20 ft in less than 5.5 sec, no assistive devices, good speed, no evidence for imbalance, normal gait pattern, deviates no more than 6 in outside of the 12 in walkway width. Change in Gait Speed: Able to smoothly change walking speed without loss of balance or gait deviation. Deviate no more than 6 in outside of the 12 in walkway width. Gait with Horizontal Head Turns: Performs head turns smoothly with slight change in gait velocity (eg, minor disruption to smooth gait path), deviates 6-10 in outside 12 in walkway width, or uses an assistive device. Gait with Vertical Head Turns: Performs task with slight change in gait velocity (eg, minor disruption to smooth gait path), deviates 6 - 10 in outside 12 in walkway width or uses assistive device Gait and Pivot Turn: Pivot turns safely in greater than 3 sec and stops with no loss of balance, or pivot turns safely within 3 sec and stops with mild imbalance, requires small steps to catch balance. Step Over Obstacle: Is  able to step over one shoe box (4.5 in total height) without changing gait speed. No evidence of imbalance. Gait with Narrow Base of Support: Ambulates 4-7 steps. Gait with Eyes Closed: Walks 20 ft, slow speed, abnormal gait pattern, evidence for imbalance, deviates 10-15 in outside 12 in walkway width. Requires more than 9 sec to ambulate 20 ft. Ambulating Backwards: Walks 20 ft, uses assistive device, slower speed, mild gait deviations, deviates 6-10 in outside 12 in walkway width. Steps: Alternating feet, must use rail. Total Score: 20 Extremity Assessment      RLE Assessment RLE Assessment: Within Functional Limits Active Range of Motion (AROM) Comments: WFL RLE Strength Right Hip Flexion: 4+/5 Right Knee Flexion: 5/5 Right Knee Extension: 5/5 Right Ankle Dorsiflexion: 5/5 Right Ankle Plantar Flexion: 5/5 LLE Assessment LLE Assessment: Exceptions to [facility] Active Range of Motion (AROM) Comments: WFL LLE Strength Left Hip Flexion: 3+/5 Left Knee Flexion: 4+/5 Left Knee Extension: 4+/5 Left Ankle Dorsiflexion: 4+/5 Left Ankle Plantar Flexion: 4+/5    Tawana Scale , PT, DPT, CSRS  03/08/2020, 7:52 AM

## 2020-03-09 LAB — GLUCOSE, CAPILLARY: Glucose-Capillary: 102 mg/dL — ABNORMAL HIGH (ref 70–99)

## 2020-03-09 MED ORDER — INSULIN GLARGINE 100 UNIT/ML SOLOSTAR PEN: 7.0000 [IU] | PEN_INJECTOR | Freq: Every day | SUBCUTANEOUS | 11 refills | Status: DC

## 2020-03-09 MED ORDER — METFORMIN HCL 500 MG PO TABS
500.0000 mg | ORAL_TABLET | Freq: Every day | ORAL | 0 refills | Status: DC
Start: 2020-03-09 — End: 2020-04-09

## 2020-03-09 NOTE — Plan of Care (Signed)
  Problem: Consults Goal: RH STROKE PATIENT EDUCATION Description: See Patient Education module for education specifics  Outcome: Progressing   Problem: RH BLADDER ELIMINATION Goal: RH STG MANAGE BLADDER WITH ASSISTANCE Description: STG Manage Bladder With mod I Assistance Outcome: Progressing   Problem: RH SAFETY Goal: RH STG ADHERE TO SAFETY PRECAUTIONS W/ASSISTANCE/DEVICE Description: STG Adhere to Safety Precautions With  cues/reminders Assistance/Device. Outcome: Progressing   Problem: RH COGNITION-NURSING Goal: RH STG USES MEMORY AIDS/STRATEGIES W/ASSIST TO PROBLEM SOLVE Description: STG Uses Memory Aids/Strategies With min Assistance to Problem Solve. Outcome: Progressing Goal: RH STG ANTICIPATES NEEDS/CALLS FOR ASSIST W/ASSIST/CUES Description: STG Anticipates Needs/Calls for Assist With Assistance/Cues. Outcome: Progressing   Problem: RH KNOWLEDGE DEFICIT Goal: RH STG INCREASE KNOWLEDGE OF HYPERTENSION Description: Patient and wife will be able to manage HTN with medications and dietary modifications using handouts and educational materials independently Outcome: Progressing Goal: RH STG INCREASE KNOWLEDGE OF DYSPHAGIA/FLUID INTAKE Description: Patient and wife will be able to manage dysphagia with medications and dietary modifications using handouts and educational materials independently Outcome: Progressing Goal: RH STG INCREASE KNOWLEGDE OF HYPERLIPIDEMIA Description: Patient and wife will be able to manage HLD  with medications and dietary modifications using handouts and educational materials independently Outcome: Progressing Goal: RH STG INCREASE KNOWLEDGE OF STROKE PROPHYLAXIS Description: Patient and wife will be able to manage secondary  stroke risks with medications and dietary modifications using handouts and educational materials independently Outcome: Progressing   Problem: RH KNOWLEDGE DEFICIT Goal: RH STG INCREASE KNOWLEDGE OF DIABETES Description:  Patient and wife will be able to manage DM with medications and dietary modifications using handouts and educational materials independently Outcome: Progressing

## 2020-03-09 NOTE — Plan of Care (Signed)
  Problem: Consults Goal: RH STROKE PATIENT EDUCATION Description: See Patient Education module for education specifics  Outcome: Completed/Met   Problem: RH BLADDER ELIMINATION Goal: RH STG MANAGE BLADDER WITH ASSISTANCE Description: STG Manage Bladder With mod I Assistance Outcome: Completed/Met   Problem: RH SAFETY Goal: RH STG ADHERE TO SAFETY PRECAUTIONS W/ASSISTANCE/DEVICE Description: STG Adhere to Safety Precautions With  cues/reminders Assistance/Device. Outcome: Completed/Met   Problem: RH COGNITION-NURSING Goal: RH STG USES MEMORY AIDS/STRATEGIES W/ASSIST TO PROBLEM SOLVE Description: STG Uses Memory Aids/Strategies With min Assistance to Problem Solve. Outcome: Completed/Met Goal: RH STG ANTICIPATES NEEDS/CALLS FOR ASSIST W/ASSIST/CUES Description: STG Anticipates Needs/Calls for Assist With Assistance/Cues. Outcome: Completed/Met   Problem: RH KNOWLEDGE DEFICIT Goal: RH STG INCREASE KNOWLEDGE OF HYPERTENSION Description: Patient and wife will be able to manage HTN with medications and dietary modifications using handouts and educational materials independently Outcome: Completed/Met Goal: RH STG INCREASE KNOWLEDGE OF DYSPHAGIA/FLUID INTAKE Description: Patient and wife will be able to manage dysphagia with medications and dietary modifications using handouts and educational materials independently Outcome: Completed/Met Goal: RH STG INCREASE KNOWLEGDE OF HYPERLIPIDEMIA Description: Patient and wife will be able to manage HLD  with medications and dietary modifications using handouts and educational materials independently Outcome: Completed/Met Goal: RH STG INCREASE KNOWLEDGE OF STROKE PROPHYLAXIS Description: Patient and wife will be able to manage secondary  stroke risks with medications and dietary modifications using handouts and educational materials independently Outcome: Completed/Met   Problem: RH KNOWLEDGE DEFICIT Goal: RH STG INCREASE KNOWLEDGE OF  DIABETES Description: Patient and wife will be able to manage DM with medications and dietary modifications using handouts and educational materials independently Outcome: Completed/Met

## 2020-03-09 NOTE — Progress Notes (Addendum)
Advanced Heart Failure Rounding Note  PCP:  Primary Cardiologist:   Subjective:    1/12 admitted for Vfib arrest, cath PCI/DES and impella 1/13 Echo EF ~50-55% LV small RV ok. 1/14 VT arrest, ROSC after 3 minutes.  1/15 Impella removed 1/16 Left sided weakness. MRI with multiple small infarcts and small ICH 1/18 Extubated.  1/22 Developed hyperactive delirium at night, head CT neg, started on valproate and seroquel  1/25 Admitted to CIR cortrak removed  Doing well, entresto d/ced and midodrine added yesterday.  He denies any dizziness or light headedness, dyspnea or chest pain.       Objective:   Weight Range: 94.1 kg Body mass index is 29.77 kg/m.   Vital Signs:   Temp:  [97.9 F (36.6 C)-98 F (36.7 C)] 98 F (36.7 C) (02/04 0535) Pulse Rate:  [68-71] 71 (02/04 0535) Resp:  [16-17] 16 (02/04 0535) BP: (105-109)/(68-78) 105/68 (02/04 0535) SpO2:  [94 %-98 %] 94 % (02/04 0535) Last BM Date: 03/06/20  Weight change: Filed Weights   02/28/20 1500  Weight: 94.1 kg    Intake/Output:   Intake/Output Summary (Last 24 hours) at 03/09/2020 0837 Last data filed at 03/09/2020 0200 Gross per 24 hour  Intake 472 ml  Output 675 ml  Net -203 ml      Physical Exam    Cardiac: JVD flat, normal rate and rhythm, clear s1 and s2, no murmurs, rubs or gallops, no LE edema Pulmonary: CTAB, not in distress Abdominal: non distended abdomen, soft and nontender Psych: Alert, conversant, in good spirits    Telemetry   n/a  EKG    n/a  Labs    CBC No results for input(s): WBC, NEUTROABS, HGB, HCT, MCV, PLT in the last 72 hours. Basic Metabolic Panel No results for input(s): NA, K, CL, CO2, GLUCOSE, BUN, CREATININE, CALCIUM, MG, PHOS in the last 72 hours. Liver Function Tests No results for input(s): AST, ALT, ALKPHOS, BILITOT, PROT, ALBUMIN in the last 72 hours. No results for input(s): LIPASE, AMYLASE in the last 72 hours. Cardiac Enzymes No results for  input(s): CKTOTAL, CKMB, CKMBINDEX, TROPONINI in the last 72 hours.  BNP: BNP (last 3 results) No results for input(s): BNP in the last 8760 hours.  ProBNP (last 3 results) No results for input(s): PROBNP in the last 8760 hours.   D-Dimer No results for input(s): DDIMER in the last 72 hours. Hemoglobin A1C No results for input(s): HGBA1C in the last 72 hours. Fasting Lipid Panel No results for input(s): CHOL, HDL, LDLCALC, TRIG, CHOLHDL, LDLDIRECT in the last 72 hours. Thyroid Function Tests No results for input(s): TSH, T4TOTAL, T3FREE, THYROIDAB in the last 72 hours.  Invalid input(s): FREET3  Other results:   Imaging    No results found.   Medications:     Scheduled Medications: . aspirin  81 mg Oral Daily  . carvedilol  3.125 mg Oral BID WC  . clopidogrel  75 mg Oral Daily  . escitalopram  20 mg Oral Daily  . feeding supplement  237 mL Oral BID BM  . insulin aspart  0-15 Units Subcutaneous TID AC & HS  . insulin glargine  7 Units Subcutaneous QHS  . midodrine  2.5 mg Oral TID WC  . multivitamin with minerals  1 tablet Oral Daily  . nystatin  5 mL Mouth/Throat QID  . pantoprazole  40 mg Oral Daily  . rosuvastatin  40 mg Oral Daily  . spironolactone  12.5 mg Oral Daily  Infusions:   PRN Medications: acetaminophen **OR** [DISCONTINUED] acetaminophen (TYLENOL) oral liquid 160 mg/5 mL **OR** [DISCONTINUED] acetaminophen, Muscle Rub, zolpidem     Assessment/Plan   1. Cardiac (VTarrest in setting of acute inferolateral STEMI/VT - OOH cardiac arrest on 1/12 with prolonged CPR, resuscitated w/ CPR + defibrillation  - Emergent cath 1/12 w/ totally occluded mid LCx treated w/ PCI + DES - Recurrent VT on 1/14 -> defib x 1 - Off amiodarone  - Supp K PRN - Mag stable.  - continue low dose carvedilol  2. Acute systolic HF due in setting of acute MI -> cardiogenic shock - EF 25% in cath lab 1/12 - Echo 1/13 EF 50-55% - Impella pulled 1/14, CVP 5-7 CO-OX  remains stable. - Renal function stable.  - Continue low dose carvedilol, spironolactone 12.5mg      3. CAD with acute inferolateral  STEMI  - Emergent cath 1/12 w/ totally occluded mid LCx treated w/ PCI + DES -There is moderate ostial LAD disease and significant disease in the distal LAD close to the apex. No significant disease affecting the right coronary artery. - Continue DAPT. Restarted statin - Brilinta switched to Plavix; Plavix therapeutic per P2y12 lab.  - continue low dose carvedilol  4. Acute CVA with ICH - left-sided weakness --> now not following commands; repeat CT shows stable hemorrhage - MRI with multiple small infarcts and small ICH (Likely cardioembolic from arrest) - Brilinta switched to Plavix - L sided weakness and coordination doing extremely well, going home today  5. Orthostatic Hypotension -Now off entresto, on midodrine 2.5mg  TID will titrate meds in outpatient setting likely can get midodrine off shortly -continue ted hose in outpatient setting  6. DM2 - HgbA1c 6.0 - well controlled, he will continue lantus on d/c - continue holding farxiga due to orthostatic hypotension   Length of Stay: 10   Angelita Ingles, MD  03/09/2020, 8:37 AM   Agree with above. Ok for d/c today. F/u HF Clinic.   Arvilla Meres, MD  4:57 PM

## 2020-03-09 NOTE — Progress Notes (Signed)
Patient discharged home with wife, all belongings and equipment sent home. ?

## 2020-03-09 NOTE — Progress Notes (Signed)
[place] PHYSICAL MEDICINE & REHABILITATION PROGRESS NOTE   Subjective/Complaints:  Pt without c/os, feels ready for d/c, discussed meds, pt states he needs preauth for Entresto   ROS- neg SOB,N/V/D, +left chest wall pain  Objective:   No results found. No results for input(s): WBC, HGB, HCT, PLT in the last 72 hours. No results for input(s): NA, K, CL, CO2, GLUCOSE, BUN, CREATININE, CALCIUM in the last 72 hours.  Intake/Output Summary (Last 24 hours) at 03/09/2020 0741 Last data filed at 03/09/2020 0200 Gross per 24 hour  Intake 472 ml  Output 675 ml  Net -203 ml        Physical Exam: Vital Signs Blood pressure 105/68, pulse 71, temperature 98 F (36.7 C), resp. rate 16, height 5\' 10"  (1.778 m), weight 94.1 kg, SpO2 94 %.  General: No acute distress Mood and affect are appropriate Heart: Regular rate and rhythm no rubs murmurs or extra sounds Lungs: Clear to auscultation, breathing unlabored, no rales or wheezes Abdomen: Positive bowel sounds, soft nontender to palpation, nondistended Extremities: No clubbing, cyanosis, or edema Skin: No evidence of breakdown, no evidence of rash  Sensory exam normal sensation to light touch and proprioception in bilateral upper and lower extremities  Musculoskeletal: Full range of motion in all 4 extremities. No joint swelling    Assessment/Plan: 1. Functional deficits which require 3+ hours per day of interdisciplinary therapy in a comprehensive inpatient rehab setting.  Physiatrist is providing close team supervision and 24 hour management of active medical problems listed below.  Physiatrist and rehab team continue to assess barriers to discharge/monitor patient progress toward functional and medical goals  Care Tool:  Bathing    Body parts bathed by patient: Right arm,Left arm,Chest,Abdomen,Front perineal area,Buttocks,Right upper leg,Left upper leg,Right lower leg,Left lower leg,Face         Bathing assist Assist  Level: Supervision/Verbal cueing     Upper Body Dressing/Undressing Upper body dressing   What is the patient wearing?: Pull over shirt    Upper body assist Assist Level: Independent    Lower Body Dressing/Undressing Lower body dressing      What is the patient wearing?: Underwear/pull up,Pants     Lower body assist Assist for lower body dressing: Independent     Toileting Toileting    Toileting assist Assist for toileting: Supervision/Verbal cueing     Transfers Chair/bed transfer  Transfers assist     Chair/bed transfer assist level: Supervision/Verbal cueing     Locomotion Ambulation   Ambulation assist      Assist level: Supervision/Verbal cueing Assistive device: No Device Max distance: 533ft   Walk 10 feet activity   Assist     Assist level: Supervision/Verbal cueing Assistive device: No Device   Walk 50 feet activity   Assist    Assist level: Supervision/Verbal cueing Assistive device: No Device    Walk 150 feet activity   Assist Walk 150 feet activity did not occur: Safety/medical concerns  Assist level: Supervision/Verbal cueing Assistive device: No Device    Walk 10 feet on uneven surface  activity   Assist Walk 10 feet on uneven surfaces activity did not occur: Safety/medical concerns   Assist level: Supervision/Verbal cueing     Wheelchair     Assist Will patient use wheelchair at discharge?: No             Wheelchair 50 feet with 2 turns activity    Assist            Wheelchair  150 feet activity     Assist          Blood pressure 105/68, pulse 71, temperature 98 F (36.7 C), resp. rate 16, height 5\' 10"  (1.778 m), weight 94.1 kg, SpO2 94 %.    Medical Problem List and Plan: 1.  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  Antithrombotics: -DVT/anticoagulation: SCDs             -antiplatelet therapy: Aspirin 81 mg daily, Plavix 75 mg daily- discussed that this is for CVA prevention  3. Pain Management: Continue Oxycodone as needed 4. Mood:  lexapro             -antipsychotic agents: none5. Neuropsych: This patient is not capable of making decisions on his own behalf. 6. Skin/Wound Care: Routine skin checks, occipital decubitus, avoid pressure no dressing needed at this time 7. Fluids/Electrolytes/Nutrition: Routine in and outs with follow-up chemistries 8.  Acute systolic congestive heart failure.  Continue low-dose Entresto 24-26 mg twice daily, Aldactone 25 mg daily and Coreg 3.125 mg twice daily  1/31: Appreciate heart failure team following, note reviewed.  Vitals:   03/08/20 1943 03/09/20 0535  BP: 109/78 105/68  Pulse: 68 71  Resp: 17 16  Temp: 98 F (36.7 C) 98 F (36.7 C)  SpO2: 98% 94%  now on Midodrine per Dr 05/07/20-  9.  Ileus.  resolved  10.  Diabetes mellitus.  Hemoglobin A1c 6.0.   CHF team to determine if Jardiance will be restarted, go home on  Lantus  7 U qhs- f/u with PCP, may not need if pt is restarted on Jardiance CBG (last 3)  Recent Labs    03/08/20 1619 03/08/20 2032 03/09/20 0536  GLUCAP 155* 128* 102*  told wife to get CBG monitoring device, also  Controlled 2/3 LOS: 10 days A FACE TO FACE EVALUATION WAS PERFORMED  05/07/20 03/09/2020, 7:41 AM

## 2020-03-13 ENCOUNTER — Other Ambulatory Visit: Payer: Self-pay

## 2020-03-13 ENCOUNTER — Ambulatory Visit: Payer: BC Managed Care – PPO | Admitting: Occupational Therapy

## 2020-03-13 ENCOUNTER — Ambulatory Visit: Payer: BC Managed Care – PPO | Attending: Physician Assistant

## 2020-03-13 ENCOUNTER — Telehealth: Payer: Self-pay | Admitting: *Deleted

## 2020-03-13 ENCOUNTER — Encounter: Payer: Self-pay | Admitting: Occupational Therapy

## 2020-03-13 VITALS — BP 107/77 | HR 68

## 2020-03-13 DIAGNOSIS — R2689 Other abnormalities of gait and mobility: Secondary | ICD-10-CM

## 2020-03-13 DIAGNOSIS — R41840 Attention and concentration deficit: Secondary | ICD-10-CM

## 2020-03-13 DIAGNOSIS — R2681 Unsteadiness on feet: Secondary | ICD-10-CM | POA: Diagnosis present

## 2020-03-13 DIAGNOSIS — R41844 Frontal lobe and executive function deficit: Secondary | ICD-10-CM | POA: Diagnosis present

## 2020-03-13 DIAGNOSIS — I69854 Hemiplegia and hemiparesis following other cerebrovascular disease affecting left non-dominant side: Secondary | ICD-10-CM | POA: Diagnosis present

## 2020-03-13 DIAGNOSIS — R278 Other lack of coordination: Secondary | ICD-10-CM | POA: Diagnosis present

## 2020-03-13 DIAGNOSIS — R41842 Visuospatial deficit: Secondary | ICD-10-CM | POA: Diagnosis present

## 2020-03-13 DIAGNOSIS — M6281 Muscle weakness (generalized): Secondary | ICD-10-CM

## 2020-03-13 DIAGNOSIS — R262 Difficulty in walking, not elsewhere classified: Secondary | ICD-10-CM | POA: Diagnosis present

## 2020-03-13 NOTE — Therapy (Signed)
[facility] [address] [place], [place], [address] Phone: [phone]   Fax:  [phone]  Physical Therapy Evaluation  Patient Details  Name: Jeffrey Morse MRN: [id number] Date of Birth: 12-28-1955 Referring Provider (PT): Charlton Amor, PA-C   Encounter Date: 03/13/2020   PT End of Session - 03/13/20 1312    Visit Number 1    Number of Visits 13    Date for PT Re-Evaluation 05/12/20   POC for 6 weeks, Cert for 60 days   Authorization Type BCBS    PT Start Time 1230    PT Stop Time 1315    PT Time Calculation (min) 45 min    Equipment Utilized During Treatment Gait belt    Activity Tolerance Patient tolerated treatment well    Behavior During Therapy [facility] for tasks assessed/performed           Past Medical History:  Diagnosis Date  . HTN (hypertension)   . Hypercholesteremia   . Hypertension    Borderline hypertension  . Sleep apnea   . Stroke [facility])     Past Surgical History:  Procedure Laterality Date  . BACK SURGERY    . CARDIAC CATHETERIZATION    . CORONARY STENT INTERVENTION N/A 02/15/2020   Procedure: CORONARY STENT INTERVENTION;  Surgeon: Iran Ouch, MD;  Location: [facility] INVASIVE CV LAB;  Service: Cardiovascular;  Laterality: N/A;  CFX  . KNEE SURGERY Left 2012  . LEFT HEART CATH AND CORONARY ANGIOGRAPHY N/A 02/15/2020   Procedure: LEFT HEART CATH AND CORONARY ANGIOGRAPHY;  Surgeon: Iran Ouch, MD;  Location: [facility] INVASIVE CV LAB;  Service: Cardiovascular;  Laterality: N/A;  . RIGHT HEART CATH N/A 02/15/2020   Procedure: RIGHT HEART CATH;  Surgeon: Iran Ouch, MD;  Location: [facility] INVASIVE CV LAB;  Service: Cardiovascular;  Laterality: N/A;  . VENTRICULAR ASSIST DEVICE INSERTION N/A 02/15/2020   Procedure: VENTRICULAR ASSIST DEVICE INSERTION;  Surgeon: Iran Ouch, MD;  Location: [facility] INVASIVE CV LAB;  Service: Cardiovascular;  Laterality: N/A;  iMPELLA     Vitals:    03/13/20 1246  BP: 107/77  Pulse: 68      Subjective Assessment - 03/13/20 1233    Subjective Presented 02/15/2020 with chest pain went to urgent care where he collapsed and underwent CPR. Pt found to be in V fib by EMS, CPR of 15 minutes prior to ROSC. Pt went into PEA again in ambulance with further CPR of 5 minutes until ROSC. Pt was intubated and underwent emergent cath with successful stent placement to L circumflex artery and impella placement on 02/15/2020. 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 Patient was discharged home on 03/09/2020. No falls to report. Not currently walking with an AD. Patient does report that blurry vision that is new since the stroke. Wife reports that she feels as if he is leaning to the left. Patient wants to build the strength back.    Patient is accompained by: Family member   Wife Teacher, adult education)   Pertinent History HTN, Hyperlipidemia    Limitations Walking;Standing    Diagnostic tests CT/MRI of the brain showed multiple small acute infarcts within the frontal lobes. Echocardiogram revealed severely reduced LV systolic function no evidence of pericardial effusion.  EKG after CPR showed evidence of inferior ST elevation with lateral involvement suggestive of left circumflex occlusion    Patient Stated Goals Build the strength    Currently in Pain? No/denies   does  have intermittent pain with coughing due to CPR             [facility] PT Assessment - 03/13/20 0001      Assessment   Medical Diagnosis CVA    Referring Provider (PT) Charlton Amor, PA-C    Onset Date/Surgical Date 02/15/20    Hand Dominance Right    Prior Therapy Inpatient Rehab      Precautions   Precautions Fall      Balance Screen   Has the patient fallen in the past 6 months Yes    How many times? 2    Has the patient had a decrease in activity level because of a fear of falling?  No    Is the patient reluctant to leave their home because of a fear of falling?  No       Home Tourist information centre manager residence    Living Arrangements Spouse/significant other    Available Help at Discharge Family    Type of Home House    Home Access Stairs to enter    Entrance Stairs-Number of Steps 8   on front porch   Entrance Stairs-Rails Right;Left    Home Layout One level      Prior Function   Level of Independence Independent    Vocation Full time employment    Catering manager; Owns his own Business    Leisure Work; Ship broker; Hunting/Hunting Dogs      Cognition   Overall Cognitive Status Within Functional Limits for tasks assessed      Observation/Other Assessments   Focus on Therapeutic Outcomes (FOTO)  57%      Sensation   Light Touch Appears Intact    Additional Comments reports mild numbness on bottom of toes on L foot      Coordination   Gross Motor Movements are Fluid and Coordinated Yes    Heel Shin Test difficulty on RLE due to pain, but WNL      ROM / Strength   AROM / PROM / Strength AROM;Strength      AROM   Overall AROM  Within functional limits for tasks performed      Strength   Overall Strength Deficits    Right Hip Flexion 4+/5    Left Hip Flexion 4-/5    Right Knee Flexion 5/5    Right Knee Extension 5/5    Left Knee Flexion 4+/5    Left Knee Extension 4+/5    Right Ankle Dorsiflexion 5/5    Right Ankle Plantar Flexion 5/5    Left Ankle Dorsiflexion 4+/5    Left Ankle Plantar Flexion 4+/5      Bed Mobility   Bed Mobility --   Reports independence with Bed Mobility     Transfers   Transfers Sit to Stand;Stand to Sit    Sit to Stand 5: Supervision    Sit to Stand Details Verbal cues for precautions/safety;Verbal cues for technique    Five time sit to stand comments  23.52 secs w/o UE support, close supervision. BP after completion: 114/72. HR: 76    Stand to Sit 5: Supervision    Comments increased challenge without UE support but able to complete       Ambulation/Gait   Ambulation/Gait Yes    Ambulation/Gait Assistance 5: Supervision    Ambulation/Gait Assistance Details Completed ambulation without AD x 115 ft no instances of imbalance or swaying noted. Decreased arm swing noted with LUE  Ambulation Distance (Feet) 115 Feet    Assistive device None    Gait Pattern Step-through pattern;Decreased arm swing - left    Ambulation Surface Level;Indoor    Gait velocity 11.31 secs =2.90 ft/sec    Stairs Yes    Stairs Assistance 5: Supervision    Stairs Assistance Details (indicate cue type and reason) ascend/descend with reciprocal pattern    Stair Management Technique Two rails;Alternating pattern;Forwards    Number of Stairs 4    Height of Stairs 6      Standardized Balance Assessment   Standardized Balance Assessment Berg Balance Test      Berg Balance Test   Sit to Stand Able to stand  independently using hands    Standing Unsupported Able to stand safely 2 minutes    Sitting with Back Unsupported but Feet Supported on Floor or Stool Able to sit safely and securely 2 minutes    Stand to Sit Sits safely with minimal use of hands    Transfers Able to transfer safely, minor use of hands    Standing Unsupported with Eyes Closed Able to stand 10 seconds safely    Standing Unsupported with Feet Together Able to place feet together independently and stand 1 minute safely    From Standing, Reach Forward with Outstretched Arm Can reach confidently >25 cm (10")    From Standing Position, Pick up Object from Floor Able to pick up shoe safely and easily    From Standing Position, Turn to Look Behind Over each Shoulder Looks behind from both sides and weight shifts well    Turn 360 Degrees Able to turn 360 degrees safely in 4 seconds or less    Standing Unsupported, Alternately Place Feet on Step/Stool Able to stand independently and safely and complete 8 steps in 20 seconds    Standing Unsupported, One Foot in Front Able to place foot tandem  independently and hold 30 seconds    Standing on One Leg Able to lift leg independently and hold 5-10 seconds    Total Score 54    Berg comment: 54/56. Vitals after completion: 117/86, HR: 72                      Objective measurements completed on examination: See above findings.               PT Education - 03/13/20 1230    Education Details Patient educated on POC/Evaluation Findings    Person(s) Educated Patient    Methods Explanation    Comprehension Verbalized understanding            PT Short Term Goals - 03/13/20 1334      PT SHORT TERM GOAL #1   Title Patient will be independent with initial HEP for balance/strengthening (ALL STGs Due: 04/03/2020)    Baseline no HEP established    Time 3    Period Weeks    Status New    Target Date 04/03/20      PT SHORT TERM GOAL #2   Title FGA to be assessed with LTG to be set as appropriate    Baseline TBA    Time 3    Period Weeks    Status New      PT SHORT TERM GOAL #3   Title Patient will improve 5x sit <> stand to </= 20 seconds without UE support to demo improved balance    Baseline 23.52 secs    Time 3  Period Weeks    Status New             PT Long Term Goals - 03/13/20 1337      PT LONG TERM GOAL #1   Title Patient will be independent with Final HEP for balance/strengthening (All LTGs Due: 04/24/20)    Baseline no HEP established    Time 6    Period Weeks    Status New    Target Date 04/24/20      PT LONG TERM GOAL #2   Title LTG to be set for FGA as applicable    Baseline TBA    Time 6    Period Weeks    Status New      PT LONG TERM GOAL #3   Title Patient will improve 5x sit <> stand to </= 15 seconds to demonstrate improved balance and reduced fall risk    Baseline 23.52 secs w/o UE support    Time 6    Period Weeks    Status New      PT LONG TERM GOAL #4   Title Patient will be able to ascend/descend 8 stairs with reciprocal pattern and no rails to demonstrate  improved ability to enter/exit home    Baseline 4 steps with recirpocal B rails    Time 6    Period Weeks    Status New      PT LONG TERM GOAL #5   Title Patient willl demo ability to ambulate >/= 1000 ft on outdoor various surfaces without AD and Mod I    Baseline 115 ft supervision indoors    Time 6    Period Weeks    Status New                  Plan - 03/13/20 1327    Clinical Impression Statement Patient is a 65 y.o. male that was referred to Neuro OPPT services post CVA. Patient's PMH significant for the following: HTN, Hyperlipidemia. Upon evaluation patient presents with the following impairments: decreased strength, impaired balance, abnormal gait, decreased endurance/activity tolerance, and increased fall risk. Patient is currently ambulating at 2.90 ft/sec without an AD. With 5x sit <> stand patient is at increased risk for falls as completed in 23.52 seconds. With Berg Balance patient scored 54/56 showing low fall risk. Further dynamic balance assessment to be completed at next session. Patient will benefit from skilled PT services to address impairments listed above, maximize functional mobility, and return to PLOF.    Personal Factors and Comorbidities Comorbidity 1    Comorbidities HTN, Hyperlipidemia    Examination-Activity Limitations Stairs;Stand;Squat;Locomotion Level    Examination-Participation Restrictions Occupation;Yard Work;Community Activity;Driving    Stability/Clinical Decision Making Stable/Uncomplicated    Clinical Decision Making Low    Rehab Potential Good    PT Frequency 2x / week    PT Duration 6 weeks    PT Treatment/Interventions ADLs/Self Care Home Management;Cryotherapy;Moist Heat;DME Instruction;Gait training;Stair training;Functional mobility training;Therapeutic activities;Therapeutic exercise;Balance training;Neuromuscular re-education;Patient/family education;Orthotic Fit/Training;Manual techniques;Passive range of motion;Vestibular    PT  Next Visit Plan Assess FGA and update LTG. Initiate balance/strengthening HEP    Recommended Other Services Occupational Therapy    Consulted and Agree with Plan of Care Patient;Family member/caregiver    Family Member Consulted Wife [name])           Patient will benefit from skilled therapeutic intervention in order to improve the following deficits and impairments:  Abnormal gait,Decreased balance,Impaired vision/preception,Difficulty walking,Decreased endurance,Pain,Decreased strength,Decreased safety awareness,Decreased activity tolerance,Impaired  perceived functional ability  Visit Diagnosis: Other abnormalities of gait and mobility  Muscle weakness (generalized)  Unsteadiness on feet  Difficulty in walking, not elsewhere classified     Problem List Patient Active Problem List   Diagnosis Date Noted  . Ischemic cerebrovascular accident (CVA) of frontal lobe (HCC) 02/28/2020  . Cerebral thrombosis with cerebral infarction 02/18/2020  . Subarachnoid hemorrhage 02/18/2020  . Cardiogenic shock (HCC) 02/15/2020  . Cardiac arrest (HCC)   . ST elevation myocardial infarction (STEMI) (HCC)   . Encounter for imaging study to confirm orogastric (OG) tube placement   . Acute respiratory failure with hypoxia (HCC)   . OSA (obstructive sleep apnea) 09/01/2016  . Hyperlipidemia 01/18/2012  . 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

## 2020-03-13 NOTE — Therapy (Signed)
[facility] [address] [place], [place], [address] Phone: [phone]   Fax:  [phone]  Occupational Therapy Evaluation  Patient Details  Name: Jeffrey Morse MRN: [id number] Date of Birth: 1955-09-20 Referring Provider (OT): Claudette Laws   Encounter Date: 03/13/2020   OT End of Session - 03/13/20 1426    Visit Number 1    Number of Visits 13    Date for OT Re-Evaluation 04/24/20    Authorization Type [id number]    Authorization Time Period VL:MN    OT Start Time 1315    OT Stop Time 1400    OT Time Calculation (min) 45 min    Activity Tolerance Patient tolerated treatment well    Behavior During Therapy [facility] for tasks assessed/performed           Past Medical History:  Diagnosis Date  . HTN (hypertension)   . Hypercholesteremia   . Hypertension    Borderline hypertension  . Sleep apnea   . Stroke [facility])     Past Surgical History:  Procedure Laterality Date  . BACK SURGERY    . CARDIAC CATHETERIZATION    . CORONARY STENT INTERVENTION N/A 02/15/2020   Procedure: CORONARY STENT INTERVENTION;  Surgeon: Iran Ouch, MD;  Location: [facility] INVASIVE CV LAB;  Service: Cardiovascular;  Laterality: N/A;  CFX  . KNEE SURGERY Left 2012  . LEFT HEART CATH AND CORONARY ANGIOGRAPHY N/A 02/15/2020   Procedure: LEFT HEART CATH AND CORONARY ANGIOGRAPHY;  Surgeon: Iran Ouch, MD;  Location: [facility] INVASIVE CV LAB;  Service: Cardiovascular;  Laterality: N/A;  . RIGHT HEART CATH N/A 02/15/2020   Procedure: RIGHT HEART CATH;  Surgeon: Iran Ouch, MD;  Location: [facility] INVASIVE CV LAB;  Service: Cardiovascular;  Laterality: N/A;  . VENTRICULAR ASSIST DEVICE INSERTION N/A 02/15/2020   Procedure: VENTRICULAR ASSIST DEVICE INSERTION;  Surgeon: Iran Ouch, MD;  Location: [facility] INVASIVE CV LAB;  Service: Cardiovascular;  Laterality: N/A;  iMPELLA     There were no vitals filed for this visit.   Subjective  Assessment - 03/13/20 1434    Subjective  Pt presents to neuro OPOT s/p Ischemic Cerebravascular Accident s/p cardiac arrest w successful PCI resulting in small infarcts in frontal lobe and right thalamic microhemmorhage. Pt reports "I had 5 heart attacks and 5 strokes". Pt reports he is "100%" and denies any pain except with sneezing (d/t CPR). Pt is accompanied by his spouse, Jeffrey Morse, and reports goal to "build my strength" and added desire to return to driving and work (although maybe not his previous line of work).    Patient is accompanied by: Family member    Pertinent History PMH: HLD, HTN    Limitations fall risk. not currently driving. monitor blood pressure    Patient Stated Goals "build my strength" driving and back to work    Currently in Pain? No/denies             [facility] OT Assessment - 03/13/20 1318      Assessment   Medical Diagnosis CVA    Referring Provider (OT) Claudette Laws    Onset Date/Surgical Date 02/15/20    Hand Dominance Right    Prior Therapy Inpatient Rehab      Precautions   Precautions Fall;Other (comment)   stint   Precaution Comments fall. no driving.      Balance Screen   Has the patient fallen in the past 6 months Yes   seeing PT  Home  Environment   Family/patient expects to be discharged to: Private residence    Living Arrangements Spouse/significant other    Available Help at Discharge Family    Type of Home Mobile home    Home Access Stairs    Home Layout Able to live on main level with bedroom/bathroom    Bathroom Shower/Tub Tub/Shower unit    Shower/tub characteristics Curtain    Information systems manager      Prior Function   Level of Independence Independent    Vocation Full time employment    Catering manager;Owns own business    Leisure [place] ([place]), playing with my grandbabies      ADL   Eating/Feeding Independent    ADL comments needs help with donning socks (bending over)       IADL   Prior Level of Function Shopping independent    Shopping --   didn't shop prior   Prior Level of Function 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 Does not participate in any housekeeping tasks    Prior Level of Function Meal Prep prepped game from hunting    Meal Prep Does not utilize stove or oven    Prior Level of Function Community Mobility drove own Wellsite geologist Relies on family or friends for transportation    Prior Level of Function Medication Managment independent    Medication Management Is not capable of dispensing or managing own medication    Prior Level of Function Financial Management took care of his own finances prior    Financial Management Requires assistance      Written Expression   Dominant Hand Right   wrote first and last name with prior legibility     Vision - History   Baseline Vision Wears glasses only for reading    Additional Comments pt reports vision is blurry farther away which is new      Vision Assessment   Comment continue to assess vision      Activity Tolerance   Activity Tolerance Tolerates 10-20 min activity with multiple rests      Cognition   Overall Cognitive Status Within Functional Limits for tasks assessed      Observation/Other Assessments   Focus on Therapeutic Outcomes (FOTO)  57%      Sensation   Light Touch Appears Intact    Hot/Cold Appears Intact      Coordination   9 Hole Peg Test Right;Left    Right 9 Hole Peg Test 29.56    Left 9 Hole Peg Test 34.5      ROM / Strength   AROM / PROM / Strength AROM;Strength      AROM   Overall AROM  Within functional limits for tasks performed      Strength   Overall Strength Deficits    Strength Assessment Site Shoulder;Elbow    Right/Left Shoulder Left    Left Shoulder Flexion 4-/5    Left Shoulder ABduction 4/5    Right/Left Elbow Left    Left Elbow Flexion 5/5    Left Elbow Extension 5/5      Hand Function   Right Hand Gross  Grasp Functional    Right Hand Grip (lbs) 52    Left Hand Gross Grasp Functional    Left Hand Grip (lbs) 56.2  OT Education - 03/13/20 1437    Education Details Education provided on role and purpose of OT    Person(s) Educated Patient    Methods Explanation    Comprehension Verbalized understanding            OT Short Term Goals - 03/13/20 1441      OT SHORT TERM GOAL #1   Title Pt will be independent with HEP 04/03/2020    Time 3    Period Weeks    Status New    Target Date 04/03/20      OT SHORT TERM GOAL #2   Title Pt will complete environmental scanning in a moderate to max distracting environment with 100% accuracy    Baseline 75% accuracy    Time 3    Period Weeks    Status New      OT SHORT TERM GOAL #3   Title Pt will demonstrate improved activity tolerance by completing standing exercises for 15 minutes or greater with no break    Time 3    Period Weeks    Status New      OT SHORT TERM GOAL #4   Title Continue to assess visual deficits and set goal PRN    Time 3    Period Weeks    Status New             OT Long Term Goals - 03/13/20 1541      OT LONG TERM GOAL #1   Title Pt will be independent with updated HEP 04/24/20    Time 6    Period Weeks    Status New    Target Date 04/24/20      OT LONG TERM GOAL #2   Title Pt will perform physical and cognitive tasks simultaneously with 95% accuracy    Time 6    Period Weeks    Status New      OT LONG TERM GOAL #3   Title Pt and caregiver will verbalize understanding for return to driving recommendations    Time 6    Period Weeks    Status New      OT LONG TERM GOAL #4   Title Pt will improve activity tolerance by completing standing activities for 20 minutes or greater consecutively in order to return to playing with grandchildren.    Time 6    Period Weeks    Status New                 Plan - 03/13/20 1426    Clinical Impression Statement  Pt is a 65 year old male that presents to neuro OPOT s/p Ischemic Cerebravascular Accident s/p cardiac arrest w successful PCI resulting in small infarcts in frontal lobe and right thalamic microhemmorhage. Pt presents with mild left hemiparesis, mild cognitive deficits and overall fatigue and decreased activity tolerance impeding overall independence with ADLs and IADLs. Skilled occupational therapy is recommended to target listed areas of deficit to increase independence and decrease caregiver burden upon discharge.    OT Occupational Profile and History Problem Focused Assessment - Including review of records relating to presenting problem    Occupational performance deficits (Please refer to evaluation for details): IADL's;ADL's;Work;Leisure    Body Structure / Function / Physical Skills ADL;UE functional use;Decreased knowledge of use of DME;Balance;FMC;Vision;Dexterity;GMC;Strength;IADL;Endurance    Cognitive Skills Understand;Problem Solve;Learn;Memory    Rehab Potential Good    Clinical Decision Making Limited treatment options, no task modification necessary    Comorbidities  Affecting Occupational Performance: None    Modification or Assistance to Complete Evaluation  No modification of tasks or assist necessary to complete eval    OT Frequency 2x / week    OT Duration 6 weeks   or 12 visits + evaluation   OT Treatment/Interventions Self-care/ADL training;Therapeutic exercise;Visual/perceptual remediation/compensation;Patient/family education;Neuromuscular education;Energy conservation;Building services engineer;Therapeutic activities;DME and/or AE instruction;Cognitive remediation/compensation    Plan environmental scanning, LUE shoulder strengthening HEP    Consulted and Agree with Plan of Care Patient;Family member/caregiver    Family Member Consulted spouse, Jeffrey Morse           Patient will benefit from skilled therapeutic intervention in order to improve the following deficits and  impairments:   Body Structure / Function / Physical Skills: ADL,UE functional use,Decreased knowledge of use of DME,Balance,FMC,Vision,Dexterity,GMC,Strength,IADL,Endurance Cognitive Skills: Rich Fuchs     Visit Diagnosis: Muscle weakness (generalized) - Plan: Ot plan of care cert/re-cert  Hemiplegia 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 Ischemic cerebrovascular accident (CVA) of frontal lobe (HCC) 02/28/2020  . Cerebral thrombosis with cerebral infarction 02/18/2020  . Subarachnoid hemorrhage 02/18/2020  . Cardiogenic shock (HCC) 02/15/2020  . Cardiac arrest (HCC)   . ST elevation myocardial infarction (STEMI) (HCC)   . Encounter for imaging study to confirm orogastric (OG) tube placement   . Acute respiratory failure with hypoxia (HCC)   . OSA (obstructive sleep apnea) 09/01/2016  . Hyperlipidemia 01/18/2012  . Transient global amnesia 01/17/2012    Junious Dresser MOT, OTR/L  03/13/2020, 3:49 PM  [facility] [address] [place], [place], [address] Phone: [phone]   Fax:  [phone]  Name: Jeffrey Morse MRN: [id number] Date of Birth: Apr 15, 1955

## 2020-03-13 NOTE — Telephone Encounter (Signed)
Transition Care Management Unsuccessful Follow-up Telephone Call  Date of discharge and from where:  [facility] 03/09/2020  Attempts:  1st Attempt  Reason for unsuccessful TCM follow-up call:  Left voice message

## 2020-03-14 NOTE — Telephone Encounter (Signed)
Transitional Care call--I spoke with Mr Jeffrey Morse    1. Are you/is patient experiencing any problems since coming home? Are there any questions regarding any aspect of care?NO 2. Are there any questions regarding medications administration/dosing? Are meds being taken as prescribed? Patient should review meds with caller to confirm He confirms he has all medications 3. Have there been any falls? NO 4. Has Home Health been to the house and/or have they contacted you? If not, have you tried to contact them? Can we help you contact them? N/A going to outpatient therapy 5. Are bowels and bladder emptying properly? Are there any unexpected incontinence issues? If applicable, is patient following bowel/bladder programs? No problems 6. Any fevers, problems with breathing, unexpected pain? No problems 7. Are there any skin problems or new areas of breakdown? NO 8. Has the patient/family member arranged specialty MD follow up (ie cardiology/neurology/renal/surgical/etc)?  Can we help arrange? Wife has managed, appt given to come to see [name] for Dr Wynn Banker 9. Does the patient need any other services or support that we can help arrange? NO 10. Are caregivers following through as expected in assisting the patient? Yes 11. Has the patient quit smoking, drinking alcohol, or using drugs as recommended? No Driving smoking or alcohol  Appointment Tuesday 03/20/20 @ 10:00 arrive by 9:40 to see [name] Alerted to watch mail for packet from our office [address]

## 2020-03-15 NOTE — Progress Notes (Addendum)
 Advanced Heart Failure Clinic Note   PCP: Dr. Ehinger HF Cardiologist: Dr. Bensimhon  HPI: [name] a 64-year-old male with no prior cardiac history.   Presented to UC 02/15/2020 with chest pain where he collapsed CPR initiated. CPR was done for 15 minutes. He lost pulse again went to PEA arrest CPR resumed.  Echo with severely reduced LV systolic function no evidence of pericardial effusion.  EKG with ST elevation with lateral involvement. Emergent cardiac catheterization showed totally occluded mid LCx treated with PCI & Impella placed for cardiogenic shock. Recurrent VT after cardiac cath, defibrillated x1, maintained on amiodarone.  Neurology consulted 02/17/2020 for left-sided weakness. EEG negative. 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 Maintained on aspirin and Plavix for CVA prophylaxis. Venous Doppler studies negative. He was discharged to CIR for on-going therapy. Discharge weight 207 lbs.  Today he returns for post-hospital follow up. Overall feeling good. Walks his drive way daily (800 feet one way). Trying to stay active. Denies increasing SOB, CP, dizziness, edema, or PND/Orthopnea. Appetite ok.  Weight at home ~197pounds. Taking all medications. Legs are weak, but no other deficits. Completed CIR and will now starting HH PT/OT. Struggling with recent vision changes, has been off amio since hospitalization.  Cardiac Studies: L/RHC 02/15/20:  PCI/DES to Left Circ, Impella - Mildly elevated filling pressures, mild pulmonary hypertension and normal cardiac output.   RVSP: 39 mm Hg RVDP: 12 mmHG PASP: 38 mmHg PADP: 24 mmHg PA mean: 29 mmHg LVSP: 125 mmHg LVDP: 18 mmHg Fick CO/CI: 6.18/2.83  Echo (02/16/20): EF ~50-55% LV small RV ok.  ROS: All systems negative except as listed in HPI, PMH and Problem List.  SH:  Social History   Socioeconomic History  . Marital status: Married    Spouse name: Not on file  . Number of  children: Not on file  . Years of education: Not on file  . Highest education level: Not on file  Occupational History  . Not on file  Tobacco Use  . Smoking status: Former Smoker  . Smokeless tobacco: Never Used  . Tobacco comment: social smoker in high School  Vaping Use  . Vaping Use: Never used  Substance and Sexual Activity  . Alcohol use: Not Currently  . Drug use: Never  . Sexual activity: Yes  Other Topics Concern  . Not on file  Social History Narrative   ** Merged History Encounter **       Social Determinants of Health   Financial Resource Strain: Not on file  Food Insecurity: Not on file  Transportation Needs: Not on file  Physical Activity: Not on file  Stress: Not on file  Social Connections: Not on file  Intimate Partner Violence: Not on file   FH:  Family History  Problem Relation Age of Onset  . Colon cancer Mother   . Stroke Father    Past Medical History:  Diagnosis Date  . HTN (hypertension)   . Hypercholesteremia   . Hypertension    Borderline hypertension  . Sleep apnea   . Stroke (HCC)     Current Outpatient Medications  Medication Sig Dispense Refill  . acetaminophen (TYLENOL) 325 MG tablet Take 2 tablets (650 mg total) by mouth every 4 (four) hours as needed for mild pain (or temp > 37.5 C (99.5 F)).    . aspirin 81 MG chewable tablet Chew 1 tablet (81 mg total) by mouth daily.    . blood glucose meter   kit and supplies Dispense based on patient and insurance preference. Use up to four times daily as directed. (FOR ICD-10 E10.9, E11.9). 1 each 0  . carvedilol (COREG) 3.125 MG tablet Take 1 tablet (3.125 mg total) by mouth 2 (two) times daily with a meal. 60 tablet 0  . clopidogrel (PLAVIX) 75 MG tablet Take 1 tablet (75 mg total) by mouth daily. 30 tablet 0  . escitalopram (LEXAPRO) 20 MG tablet Take 1 tablet (20 mg total) by mouth daily. 30 tablet 0  . metFORMIN (GLUCOPHAGE) 500 MG tablet Take 1 tablet (500 mg total) by mouth daily with  breakfast. 30 tablet 0  . Multiple Vitamin (MULTIVITAMIN WITH MINERALS) TABS tablet Take 1 tablet by mouth daily.    . omeprazole (PRILOSEC) 20 MG capsule Take 1 capsule (20 mg total) by mouth daily. 30 capsule 0  . rosuvastatin (CRESTOR) 40 MG tablet Take 1 tablet (40 mg total) by mouth daily. 30 tablet 0  . spironolactone (ALDACTONE) 25 MG tablet Take 0.5 tablets (12.5 mg total) by mouth daily. 30 tablet 0   No current facility-administered medications for this encounter.    Vitals:   03/16/20 1017  BP: 135/84  Pulse: 72  SpO2: 96%  Weight: 90 kg (198 lb 6.4 oz)   Wt Readings from Last 3 Encounters:  03/16/20 90 kg (198 lb 6.4 oz)  02/28/20 94.1 kg (207 lb 7.3 oz)  02/25/20 99.1 kg (218 lb 7.6 oz)   PHYSICAL EXAM: General:  Well appearing. No resp difficulty HEENT: normal Neck: supple. JVP flat. Carotids 2+ bilaterally; no bruits. No lymphadenopathy or thryomegaly appreciated. Cor: PMI normal. Regular rate & rhythm. No rubs, gallops or murmurs. Lungs: clear Abdomen: soft, nontender, nondistended. No hepatosplenomegaly. No bruits or masses. Good bowel sounds. Extremities: no cyanosis, clubbing, rash, edema Neuro: alert & orientedx3, cranial nerves grossly intact. Moves all 4 extremities w/o difficulty. Affect pleasant.  ECG: SR ?RBBB, 63 bpm (personally reviewed).  ASSESSMENT & PLAN: 1. H/O Cardiac (VT arrest in setting of acute inferolateral STEMI/VT) - OOH cardiac arrest on 02/15/20 with prolonged CPR, resuscitated w/ CPR + defibrillation  - Emergent cath 02/15/20 w/ totally occluded mid LCx treated w/ PCI + DES - Recurrent VT on 1/14 w/ defib x1 - Off amiodarone.  - Continue carvedilol 3.125 mg bid.  2. Chronic systolic HF - EF (02/15/20): 25% in cath lab.  - Echo (02/16/20): EF 50-55%. - NYHA II, euvolemic today. He does not need daily lasix. - Continue carvedilol 3.125 mg bid.  - Continue spironolactone 12.5 mg daily. - Consider adding SGLT2i at next visit. - BMET  today.  3. CAD  - Emergent cath 1/12 w/ totally occluded mid LCx treated w/ PCI + DES -There is moderate ostial LAD disease and significant disease in the distal LAD close to the apex. No significant disease affecting the right coronary artery. - No chest pain. - Continue DAPT + rosuvastatin. -Continue carvedilol.  4. H/o CVA with ICH - MRI with multiple small infarcts and small ICH (Likely cardioembolic from arrest). - L sided weakness resolved.  5.Orthostatic Hypotension - Resolved. No further symptoms - Stop midodrine.  6. DM2 - HgbA1c 6.0. - Continue Metformin. - Consider adding Farxiga at next visit if orthostatic symptoms have not returned with discontinuation of midodrine.  Follow up in 3 weeks with PharmD consider adding SGLT2i, then follow up with Dr. Bensimhon in 2-3 months.   , FNP-BC 03/16/20       

## 2020-03-16 ENCOUNTER — Encounter (HOSPITAL_COMMUNITY): Payer: Self-pay

## 2020-03-16 ENCOUNTER — Ambulatory Visit (HOSPITAL_COMMUNITY)
Admission: RE | Admit: 2020-03-16 | Discharge: 2020-03-16 | Disposition: A | Discharge status: Home / Self Care | Payer: BC Managed Care – PPO | Source: Ambulatory Visit | Attending: Adult Health | Admitting: Adult Health

## 2020-03-16 ENCOUNTER — Other Ambulatory Visit: Payer: Self-pay

## 2020-03-16 VITALS — BP 135/84 | HR 72 | Wt 198.4 lb

## 2020-03-16 DIAGNOSIS — Z955 Presence of coronary angioplasty implant and graft: Secondary | ICD-10-CM | POA: Diagnosis not present

## 2020-03-16 DIAGNOSIS — Z8673 Personal history of transient ischemic attack (TIA), and cerebral infarction without residual deficits: Secondary | ICD-10-CM | POA: Diagnosis not present

## 2020-03-16 DIAGNOSIS — Z7984 Long term (current) use of oral hypoglycemic drugs: Secondary | ICD-10-CM | POA: Insufficient documentation

## 2020-03-16 DIAGNOSIS — Z8674 Personal history of sudden cardiac arrest: Secondary | ICD-10-CM | POA: Insufficient documentation

## 2020-03-16 DIAGNOSIS — Z79899 Other long term (current) drug therapy: Secondary | ICD-10-CM | POA: Insufficient documentation

## 2020-03-16 DIAGNOSIS — I252 Old myocardial infarction: Secondary | ICD-10-CM | POA: Diagnosis not present

## 2020-03-16 DIAGNOSIS — Z7982 Long term (current) use of aspirin: Secondary | ICD-10-CM | POA: Insufficient documentation

## 2020-03-16 DIAGNOSIS — I2510 Atherosclerotic heart disease of native coronary artery without angina pectoris: Secondary | ICD-10-CM | POA: Diagnosis not present

## 2020-03-16 DIAGNOSIS — Z87891 Personal history of nicotine dependence: Secondary | ICD-10-CM | POA: Insufficient documentation

## 2020-03-16 DIAGNOSIS — I110 Hypertensive heart disease with heart failure: Secondary | ICD-10-CM | POA: Insufficient documentation

## 2020-03-16 DIAGNOSIS — E1159 Type 2 diabetes mellitus with other circulatory complications: Secondary | ICD-10-CM

## 2020-03-16 DIAGNOSIS — E119 Type 2 diabetes mellitus without complications: Secondary | ICD-10-CM | POA: Diagnosis not present

## 2020-03-16 DIAGNOSIS — I4510 Unspecified right bundle-branch block: Secondary | ICD-10-CM | POA: Insufficient documentation

## 2020-03-16 DIAGNOSIS — I951 Orthostatic hypotension: Secondary | ICD-10-CM | POA: Insufficient documentation

## 2020-03-16 DIAGNOSIS — I5022 Chronic systolic (congestive) heart failure: Secondary | ICD-10-CM | POA: Insufficient documentation

## 2020-03-16 DIAGNOSIS — E7800 Pure hypercholesterolemia, unspecified: Secondary | ICD-10-CM | POA: Insufficient documentation

## 2020-03-16 DIAGNOSIS — Z7902 Long term (current) use of antithrombotics/antiplatelets: Secondary | ICD-10-CM | POA: Insufficient documentation

## 2020-03-16 NOTE — Patient Instructions (Signed)
STOP Midodrine  Labs done today, your results will be available in MyChart, we will contact you for abnormal readings.  Your physician recommends that you schedule a follow-up appointment in: 3 weeks for pharmacy  Your physician recommends that you schedule a follow-up appointment in: 6 weeks with APP  If you have any questions or concerns before your next appointment please send [place] a message through Cable or call our office at [phone].    TO LEAVE A MESSAGE FOR THE NURSE SELECT OPTION 2, PLEASE LEAVE A MESSAGE INCLUDING: . YOUR NAME . DATE OF BIRTH . CALL BACK NUMBER . REASON FOR CALL**this is important as we prioritize the call backs  YOU WILL RECEIVE A CALL BACK THE SAME DAY AS LONG AS YOU CALL BEFORE 4:00 PM

## 2020-03-16 NOTE — Addendum Note (Signed)
Encounter addended by: Jacklynn Ganong, FNP on: 03/16/2020 8:53 PM  Actions taken: Clinical Note Signed

## 2020-03-17 ENCOUNTER — Telehealth: Payer: Self-pay | Admitting: Medical

## 2020-03-17 NOTE — Telephone Encounter (Signed)
   Patients wife, Jeffrey Morse, called the after hours line to report low blood pressures. Patient was seen by [name], NP with Advanced HF yesterday and midodrine discontinued given improvement in BP. Todays readings were 102/69 in the morning, 92/72 at lunch with repeat 91/70, then 104/79 ~30 minutes ago. She reports Jeffrey Morse was asymptomatic at the time and has had no complaints of dizziness, lightheadedness, syncope, or orthostatic symptoms. Current medications reviewed. Favor staying the course for now with close monitoring. Instructed them to notify the office if readings dip below SBP of 90 which may indicate a need to restart midodrine or back off on current medications. She was in agreement with the plan and appreciative of the call.   Beatriz Stallion, PA-C 03/17/20; 5:07 PM

## 2020-03-19 ENCOUNTER — Other Ambulatory Visit: Payer: Self-pay

## 2020-03-19 ENCOUNTER — Ambulatory Visit: Payer: BC Managed Care – PPO | Admitting: Occupational Therapy

## 2020-03-19 ENCOUNTER — Ambulatory Visit: Payer: BC Managed Care – PPO | Admitting: Physical Therapy

## 2020-03-19 VITALS — BP 119/83 | HR 74

## 2020-03-19 DIAGNOSIS — I69854 Hemiplegia and hemiparesis following other cerebrovascular disease affecting left non-dominant side: Secondary | ICD-10-CM

## 2020-03-19 DIAGNOSIS — R262 Difficulty in walking, not elsewhere classified: Secondary | ICD-10-CM

## 2020-03-19 DIAGNOSIS — R2689 Other abnormalities of gait and mobility: Secondary | ICD-10-CM

## 2020-03-19 DIAGNOSIS — R2681 Unsteadiness on feet: Secondary | ICD-10-CM

## 2020-03-19 DIAGNOSIS — R41842 Visuospatial deficit: Secondary | ICD-10-CM

## 2020-03-19 DIAGNOSIS — M6281 Muscle weakness (generalized): Secondary | ICD-10-CM

## 2020-03-19 NOTE — Patient Instructions (Signed)
Access Code: [email or web address] URL: [email or web address] Date: 03/19/2020 Prepared by: Sherlie Ban  Exercises Romberg Stance on Foam Pad with Head Rotation - 1 x daily - 5 x weekly - 2 sets - 10 reps Sit to Stand with Arms Crossed - 2-3 x daily - 5 x weekly - 2 sets - 5 reps Tandem Walking with Counter Support - 2 x daily - 5 x weekly - 3 sets Standing Marching - 2 x daily - 5 x weekly - 3 sets

## 2020-03-19 NOTE — Therapy (Signed)
[facility] [address] [place], [place], [address] Phone: [phone]   Fax:  [phone]  Physical Therapy Treatment  Patient Details  Name: Jeffrey Morse MRN: [id number] Date of Birth: 1955/09/06 Referring Provider (PT): Charlton Amor, PA-C   Encounter Date: 03/19/2020   PT End of Session - 03/19/20 1631    Visit Number 2    Number of Visits 13    Date for PT Re-Evaluation 05/12/20   POC for 6 weeks, Cert for 60 days   Authorization Type BCBS    PT Start Time 1401    PT Stop Time 1444    PT Time Calculation (min) 43 min    Equipment Utilized During Treatment Gait belt    Activity Tolerance Patient tolerated treatment well    Behavior During Therapy [facility] for tasks assessed/performed           Past Medical History:  Diagnosis Date  . HTN (hypertension)   . Hypercholesteremia   . Hypertension    Borderline hypertension  . Sleep apnea   . Stroke [facility])     Past Surgical History:  Procedure Laterality Date  . BACK SURGERY    . CARDIAC CATHETERIZATION    . CORONARY STENT INTERVENTION N/A 02/15/2020   Procedure: CORONARY STENT INTERVENTION;  Surgeon: Iran Ouch, MD;  [facility] INV[facility] LAB;  Service: Cardiovascular;  Laterality: N/A;  CFX  . KNEE SURGERY Left 2012  . LEFT HEART CATH AND CORONARY ANGIOGRAPHY N/A 02/15/2020   Procedure: LEFT HEART CATH AND CORONARY ANGIOGRAPHY;  Surgeon: Iran Ouch, MD;  [facility]: [facility] IN[facility] CV LAB;  Service: Cardiovascular;  Laterality: N/A;  . RIGHT HEART CATH N/A 02/15/2020   Procedure: RIGHT HEART CATH;  Surgeon: Iran Ouch, MD;  [facility]: [facility] LAB;  Service: Cardiovascular;  Laterality: N/A;  . VENTRICULAR ASSIST DEVICE INSERTION N/A 02/15/2020   Procedure: VENTRICULAR ASSIST DEVICE INSERTION;  Surgeon: Iran Ouch, MD;  [facility]: [facility] INV[facility] CV LAB;  Service: Cardiovascular;  Laterality: N/A;  iMPELLA     Vitals:    03/19/20 1408  BP: 119/83  Pulse: 74     Subjective Assessment - 03/19/20 1404    Subjective Had some lower BP over the weekend on saturday. Cardiologist stopped the midodrine. Reports his BP has leveled out at home.    Patient is accompained by: Family member   Wife Teacher, adult education)   Pertinent History HTN, Hyperlipidemia    Limitations Walking;Standing    Diagnostic tests CT/MRI of the brain showed multiple small acute infarcts within the frontal lobes. Echocardiogram revealed severely reduced LV systolic function no evidence of pericardial effusion.  EKG after CPR showed evidence of inferior ST elevation with lateral involvement suggestive of left circumflex occlusion    Patient Stated Goals Build the strength              [facility] PT Assessment - 03/19/20 1411      High Level Balance   High Level Balance Comments mCTSIB: conditions 1-4 = 30 seconds, mild postural sway with condition 4      Functional Gait  Assessment   Gait assessed  Yes    Gait Level Surface Walks 20 ft in less than 5.5 sec, no assistive devices, good speed, no evidence for imbalance, normal gait pattern, deviates no more than 6 in outside of the 12 in walkway width.    Change in Gait Speed Able to smoothly change walking speed without loss of  balance or gait deviation. Deviate no more than 6 in outside of the 12 in walkway width.    Gait with Horizontal Head Turns Performs head turns smoothly with slight change in gait velocity (eg, minor disruption to smooth gait path), deviates 6-10 in outside 12 in walkway width, or uses an assistive device.    Gait with Vertical Head Turns Performs head turns with no change in gait. Deviates no more than 6 in outside 12 in walkway width.    Gait and Pivot Turn Pivot turns safely within 3 sec and stops quickly with no loss of balance.    Step Over Obstacle Is able to step over 2 stacked shoe boxes taped together (9 in total height) without changing gait speed. No evidence of imbalance.     Gait with Narrow Base of Support Ambulates 7-9 steps.    Gait with Eyes Closed Walks 20 ft, slow speed, abnormal gait pattern, evidence for imbalance, deviates 10-15 in outside 12 in walkway width. Requires more than 9 sec to ambulate 20 ft.   10.68   Ambulating Backwards Walks 20 ft, no assistive devices, good speed, no evidence for imbalance, normal gait    Steps Alternating feet, no rail.    Total Score 26    FGA comment: 26/30 = low fall risk             Access Code: [email or web address] URL: [email or web address] Date: 03/19/2020 Prepared by: Sherlie Ban  Initiated HEP:   Exercises Romberg Stance on Foam Pad with Head Rotation - 1 x daily - 5 x weekly - 2 sets - 10 reps - with eyes closed, slight space between feet.  Sit to Stand with Arms Crossed - 2-3 x daily - 5 x weekly - 2 sets - 5 reps Tandem Walking with Counter Support - 2 x daily - 5 x weekly - 3 sets - forwards and backwards Standing Marching - 2 x daily - 5 x weekly - 3 sets             [facility] Adult PT Treatment/Exercise - 03/19/20 0001      Transfers   Transfers Sit to Stand;Stand to Sit    Sit to Stand 5: Supervision    Stand to Sit 5: Supervision    Comments 2 x 5 reps with no UE support, cues for wider BOS, as pt initially performing with narrow BOS                 PT Education - 03/19/20 1630    Education Details results of FGA, use of nightlight when getting up at night due to incr difficulty with balance with eyes closed, initial HEP    Person(s) Educated Spouse;Patient    Methods Explanation;Handout    Comprehension Verbalized understanding            PT Short Term Goals - 03/19/20 1636      PT SHORT TERM GOAL #1   Title Patient will be independent with initial HEP for balance/strengthening (ALL STGs Due: 04/03/2020)    Baseline no HEP established    Time 3    Period Weeks    Status New    Target Date 04/03/20      PT SHORT TERM GOAL #2   Title FGA to be assessed with  LTG to be set as appropriate    Baseline 26/30 - LTG written as appropriate.    Time 3    Period Weeks    Status Achieved  PT SHORT TERM GOAL #3   Title Patient will improve 5x sit <> stand to </= 20 seconds without UE support to demo improved balance    Baseline 23.52 secs    Time 3    Period Weeks    Status New             PT Long Term Goals - 03/19/20 1636      PT LONG TERM GOAL #1   Title Patient will be independent with Final HEP for balance/strengthening (All LTGs Due: 04/24/20)    Baseline no HEP established    Time 6    Period Weeks    Status New      PT LONG TERM GOAL #2   Title Pt will improve FGA score to at least a 29/30 in order to demo decr fall risk.    Baseline 26/30 on 03/19/20    Time 6    Period Weeks    Status Revised      PT LONG TERM GOAL #3   Title Patient will improve 5x sit <> stand to </= 15 seconds to demonstrate improved balance and reduced fall risk    Baseline 23.52 secs w/o UE support    Time 6    Period Weeks    Status New      PT LONG TERM GOAL #4   Title Patient will be able to ascend/descend 8 stairs with reciprocal pattern and no rails to demonstrate improved ability to enter/exit home    Baseline 4 steps with recirpocal B rails    Time 6    Period Weeks    Status New      PT LONG TERM GOAL #5   Title Patient willl demo ability to ambulate >/= 1000 ft on outdoor various surfaces without AD and Mod I    Baseline 115 ft supervision indoors    Time 6    Period Weeks    Status New                 Plan - 03/19/20 1637    Clinical Impression Statement Performed the FGA today with pt scoring a 26/30, indicating pt is at a low risk for falls, updated LTG as appropriate. Pt able to perform all 4 conditions of the mCTSIB for 30 seconds, but having increased postural sway with condition 4. Provided pt with initial HEP for strength and balance. Pt tolerated session well, will continue to progress towards LTGs.    Personal  Factors and Comorbidities Comorbidity 1    Comorbidities HTN, Hyperlipidemia    Examination-Activity Limitations Stairs;Stand;Squat;Locomotion Level    Examination-Participation Restrictions Occupation;Yard Work;Community Activity;Driving    Stability/Clinical Decision Making Stable/Uncomplicated    Rehab Potential Good    PT Frequency 2x / week    PT Duration 6 weeks    PT Treatment/Interventions ADLs/Self Care Home Management;Cryotherapy;Moist Heat;DME Instruction;Gait training;Stair training;Functional mobility training;Therapeutic activities;Therapeutic exercise;Balance training;Neuromuscular re-education;Patient/family education;Orthotic Fit/Training;Manual techniques;Passive range of motion;Vestibular    PT Next Visit Plan how is HEP? add when appropriate. high level balance. functional BLE strengthening.    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 Ischemic cerebrovascular accident (CVA) of frontal lobe (HCC) 02/28/2020  . Cerebral thrombosis with cerebral infarction 02/18/2020  . Subarachnoid hemorrhage 02/18/2020  . Cardiogenic shock (HCC) 02/15/2020  . Cardiac arrest (HCC)   . ST elevation myocardial infarction (STEMI) (HCC)    . Encounter for imaging study to confirm orogastric (OG) tube placement   . Acute respiratory failure with hypoxia (HCC)   . OSA (obstructive sleep apnea) 09/01/2016  . Hyperlipidemia 01/18/2012  . Transient global amnesia 01/17/2012    Drake Leach, PT, DPT  03/19/2020, 4:38 PM  [facility] [address] [place], [place], [address] Phone: [phone]   Fax:  [phone]  Name: Jeffrey Morse MRN: [id number] Date of Birth: 1955-03-09

## 2020-03-19 NOTE — Therapy (Signed)
[facility] [address] [place], [place], [address] Phone: [phone]   Fax:  [phone]  Occupational Therapy Treatment  Patient Details  Name: Jeffrey Morse MRN: [id number] Date of Birth: Aug 14, 1955 Referring Provider (OT): Claudette Laws   Encounter Date: 03/19/2020   OT End of Session - 03/19/20 1311    Visit Number 2    Number of Visits 13    Date for OT Re-Evaluation 04/24/20    Authorization Type [employer]    Authorization Time Period VL:MN    OT Start Time 1320    OT Stop Time 1400    OT Time Calculation (min) 40 min    Activity Tolerance Patient tolerated treatment well    Behavior During Therapy [facility] for tasks assessed/performed           Past Medical History:  Diagnosis Date  . HTN (hypertension)   . Hypercholesteremia   . Hypertension    Borderline hypertension  . Sleep apnea   . Stroke [facility])     Past Surgical History:  Procedure Laterality Date  . BACK SURGERY    . CARDIAC CATHETERIZATION    . CORONARY STENT INTERVENTION N/A 02/15/2020   Procedure: CORONARY STENT INTERVENTION;  Surgeon: Iran Ouch, MD;  [facility];  Service: Cardiovascular;  Laterality: N/A;  CFX  . KNEE SURGERY Left 2012  . LEFT HEART CATH AND CORONARY ANGIOGRAPHY N/A 02/15/2020   Procedure: LEFT HEART CATH AND CORONARY ANGIOGRAPHY;  Surgeon: Iran Ouch, MD;  [facility];  Service: Cardiovascular;  Laterality: N/A;  . RIGHT HEART CATH N/A 02/15/2020   Procedure: RIGHT HEART CATH;  Surgeon: Iran Ouch, MD;  [facility];  Service: Cardiovascular;  Laterality: N/A;  . VENTRICULAR ASSIST DEVICE INSERTION N/A 02/15/2020   Procedure: VENTRICULAR ASSIST DEVICE INSERTION;  Surgeon: Iran Ouch, MD;  [facility];  Service: Cardiovascular;  Laterality: N/A;  iMPELLA     There were no vitals filed for this visit.   Subjective  Assessment - 03/19/20 1310    Subjective  Pt reports that he reports that he wore reading glasses prior, but now just blurriness across the room.  Maybe a little more blurry on L side.  Pt reports that he feed his hunting dogs. (with wife)    Patient is accompanied by: Family member    Pertinent History PMH: HLD, HTN    Limitations fall risk. not currently driving. monitor blood pressure    Patient Stated Goals "build my strength" driving and back to work    Currently in Pain? No/denies            Functional reaching (overhead) in standing and sitting to place clothespins with 1-8lb resistance on vertical pole for incr activity tolerance.  Arm bike x level 1 for conditioning without rest, forward/backwards.   Environmental scanning with 13/15 items found on first pass and remaining found on 2nd pass (min cueing for 1 of the 2 missed items) with good balance and no impulsivity noted.  Functional reaching in standing to place small pegs in vertical pegboard with LUE for incr activity tolerance and for visual scanning.  Pt reports no blurriness and copied design with good accuracy.  Pt able to pick up dropped peg from floor without LOB and no rest breaks needed.  Pt reports fatigue after bathing, and needs rest prior to continuing self-care routine.   Patient Education:  Yellow  theraband HEP--see pt instructions (start at 10 reps 1x/day, after 3-4 days slowly incr to 15 reps as tolerated with goal to slowly progress to 15 reps 2x/day).  (pt/spouse educated.  Pt verbalized understanding and verbalized understanding and handout provided)       OT Short Term Goals - 03/13/20 1441      OT SHORT TERM GOAL #1   Title Pt will be independent with HEP 04/03/2020    Time 3    Period Weeks    Status New    Target Date 04/03/20      OT SHORT TERM GOAL #2   Title Pt will complete environmental scanning in a moderate to max distracting environment with 100% accuracy    Baseline 75% accuracy     Time 3    Period Weeks    Status New      OT SHORT TERM GOAL #3   Title Pt will demonstrate improved activity tolerance by completing standing exercises for 15 minutes or greater with no break    Time 3    Period Weeks    Status New      OT SHORT TERM GOAL #4   Title Continue to assess visual deficits and set goal PRN    Time 3    Period Weeks    Status New             OT Long Term Goals - 03/13/20 1541      OT LONG TERM GOAL #1   Title Pt will be independent with updated HEP 04/24/20    Time 6    Period Weeks    Status New    Target Date 04/24/20      OT LONG TERM GOAL #2   Title Pt will perform physical and cognitive tasks simultaneously with 95% accuracy    Time 6    Period Weeks    Status New      OT LONG TERM GOAL #3   Title Pt and caregiver will verbalize understanding for return to driving recommendations    Time 6    Period Weeks    Status New      OT LONG TERM GOAL #4   Title Pt will improve activity tolerance by completing standing activities for 20 minutes or greater consecutively in order to return to playing with grandchildren.    Time 6    Period Weeks    Status New                 Plan - 03/19/20 1311    Clinical Impression Statement Pt is progressing towards goals with improving activity tolerance overall.    OT Occupational Profile and History Problem Focused Assessment - Including review of records relating to presenting problem    Occupational performance deficits (Please refer to evaluation for details): IADL's;ADL's;Work;Leisure    Body Structure / Function / Physical Skills ADL;UE functional use;Decreased knowledge of use of DME;Balance;FMC;Vision;Dexterity;GMC;Strength;IADL;Endurance    Cognitive Skills Understand;Problem Solve;Learn;Memory    Rehab Potential Good    Clinical Decision Making Limited treatment options, no task modification necessary    Comorbidities Affecting Occupational Performance: None    Modification or  Assistance to Complete Evaluation  No modification of tasks or assist necessary to complete eval    OT Frequency 2x / week    OT Duration 6 weeks   or 12 visits + evaluation   OT Treatment/Interventions Self-care/ADL training;Therapeutic exercise;Visual/perceptual remediation/compensation;Patient/family education;Neuromuscular education;Energy conservation;Building services engineer;Therapeutic activities;DME and/or AE instruction;Cognitive remediation/compensation  Plan environmental scanning in busy environment, review LUE shoulder strengthening HEP, activity tolerance    Consulted and Agree with Plan of Care Patient;Family member/caregiver    Family Member Consulted spouse, [name]           Patient will benefit from skilled therapeutic intervention in order to improve the following deficits and impairments:   Body Structure / Function / Physical Skills: ADL,UE functional use,Decreased knowledge of use of DME,Balance,FMC,Vision,Dexterity,GMC,Strength,IADL,Endurance Cognitive Skills: Rich Fuchs     Visit Diagnosis: Hemiplegia and hemiparesis following other cerebrovascular disease affecting left non-dominant side (HCC)  Unsteadiness on feet  Visuospatial deficit    Problem List Patient Active Problem List   Diagnosis Date Noted  . Ischemic cerebrovascular accident (CVA) of frontal lobe (HCC) 02/28/2020  . Cerebral thrombosis with cerebral infarction 02/18/2020  . Subarachnoid hemorrhage 02/18/2020  . Cardiogenic shock (HCC) 02/15/2020  . Cardiac arrest (HCC)   . ST elevation myocardial infarction (STEMI) (HCC)   . Encounter for imaging study to confirm orogastric (OG) tube placement   . Acute respiratory failure with hypoxia (HCC)   . OSA (obstructive sleep apnea) 09/01/2016  . Hyperlipidemia 01/18/2012  . Transient global amnesia 01/17/2012    [facility] 03/19/2020, 2:29 PM  [facility] [address] [place], [place], [address] Phone: [phone]   Fax:  [phone]  Name: Jeffrey Morse MRN: [id number] Date of Birth: Jul 07, 1955   Willa Frater, OTR/L [facility] [address] [place], [place]  [address] [phone] phone [phone] 03/19/20 2:29 PM

## 2020-03-19 NOTE — Patient Instructions (Signed)
     Strengthening: Resisted Flexion   Attach tube to door.  Hold tubing with left arm at side. Pull forward and up with elbow straight. Move shoulder through pain-free range of motion, no further than shoulder height. Repeat 10-15 times per set.  Do 1-2 sessions per day.    Strengthening: Resisted Extension   Attach one end to door.  Hold tubing in left hand, arm forward. Pull arm back, elbow straight. Repeat 10-15 times per set. Do 1-2 sessions per day.   Resisted Horizontal Abduction: Bilateral   Sit or stand, tubing in both hands, palms down and arms out in front. Keeping arms straight, pinch shoulder blades together and stretch arms out. Repeat 10-15 times per set.  Do 1-2 sessions per day.

## 2020-03-20 ENCOUNTER — Encounter: Payer: BC Managed Care – PPO | Attending: Registered Nurse | Admitting: Registered Nurse

## 2020-03-20 ENCOUNTER — Encounter: Payer: Self-pay | Admitting: Registered Nurse

## 2020-03-20 VITALS — BP 120/80 | HR 74 | Temp 98.6°F | Ht 70.0 in | Wt 198.4 lb

## 2020-03-20 DIAGNOSIS — I639 Cerebral infarction, unspecified: Secondary | ICD-10-CM | POA: Diagnosis not present

## 2020-03-20 DIAGNOSIS — I469 Cardiac arrest, cause unspecified: Secondary | ICD-10-CM | POA: Diagnosis not present

## 2020-03-20 DIAGNOSIS — E1165 Type 2 diabetes mellitus with hyperglycemia: Secondary | ICD-10-CM | POA: Insufficient documentation

## 2020-03-20 NOTE — Progress Notes (Signed)
Subjective:    Patient ID: Jeffrey Morse, male    DOB: August 10, 1955, 65 y.o.   MRN: [id number]  HPI: Jeffrey Morse is a 65 y.o. male who is here for Transitional Care Visit for Follow up of his Ischemic cerebrovascular accidental of frontal lobe, Type 2 DM without long-term insulin use and cardiac arrest ST Elevation Myocardial Infarction.Marland Kitchen He presented to Urgent Care on 02/15/2020 with chest pain, he collapsed and CPR was initiated. He was in ventricular fibrillation was shocked successfullyEMS transferred him to [facility]. He was taking for emergent cardiac catheterization.   Jeffrey Morse underwent: see Below by Dr Kirke Corin and Dr Gala Romney  LEFT HEART CATH AND CORONARY ANGIOGRAPHY N/A Local  VENTRICULAR ASSIST DEVICE INSERTION N/A Local  iMPELLA     CORONARY STENT INTERVENTION N/A Local  CFX    RIGHT HEART CATH     Neurology was consulted:  CT Head WO Contrast:  IMPRESSION: 1. Subcentimeter hyperdense focus within the right thalamic region, suspicious for a small acute bleed. MRI correlation is recommended. 2. Mild cerebral atrophy. 3. Moderate to marked severity paranasal sinus disease. MR Brain:  IMPRESSION: 1. Multiple small acute infarcts within the frontal lobes. 2. New right thalamic microhemorrhage, since 01/18/12. 3. Normal intracranial MRA. Jeffrey Morse currently is maintained on aspirin and plavix for CVA prophylaxis, neurology and cardiology following.   Jeffrey Morse was admitted to inpatient rehabilitation on 02/28/2020 and discharged home on 03/09/2020. He is attending outpatient therapy at [facility]. He denies any pain. He rates his pain 0. Also reports he has a good appetite.   Pain Inventory Average Pain 0 Pain Right Now 0 My pain is no pain  LOCATION OF PAIN  No pain. Here for Heart Attacks BOWEL Number of stools per week: 7 Oral laxative use No  Type of laxative None  Enema or suppository use No  History of colostomy No  Incontinent  No   BLADDE Able to self cath  N/a Bladder incontinence No  Frequent urination No  Leakage with coughing No  Difficulty starting stream No  Incomplete bladder emptying No    Mobility how many minutes can you walk? No problem walking ability to climb steps?  yes do you drive?  no Do you have any goals in this area?  yes  Function employed # of hrs/week last day of work 02/15/2020. Self employed [employer]  Neuro/Psych weakness  Prior Studies Any changes since last visit?  no New Patient  Physicians involved in your care Any changes since last visit?  no New Patient   Family History  Problem Relation Age of Onset  . Colon cancer Mother   . Stroke Father    Social History   Socioeconomic History  . Marital status: Married    Spouse name: Not on file  . Number of children: Not on file  . Years of education: Not on file  . Highest education level: Not on file  Occupational History  . Not on file  Tobacco Use  . Smoking status: Former Games developer  . Smokeless tobacco: Never Used  . Tobacco comment: social smoker in Scientist, forensic  . Vaping Use: Never used  Substance and Sexual Activity  . Alcohol use: Not Currently  . Drug use: Never  . Sexual activity: Yes  Other Topics Concern  . Not on file  Social History Narrative   ** Merged History Encounter **       Social Determinants of Health  Financial Resource Strain: Not on file  Food Insecurity: Not on file  Transportation Needs: Not on file  Physical Activity: Not on file  Stress: Not on file  Social Connections: Not on file   Past Surgical History:  Procedure Laterality Date  . BACK SURGERY    . CARDIAC CATHETERIZATION    . CORONARY STENT INTERVENTION N/A 02/15/2020   Procedure: CORONARY STENT INTERVENTION;  Surgeon: Iran Ouch, MD;  Location: [facility];  Service: Cardiovascular;  Laterality: N/A;  CFX  . KNEE SURGERY Left 2012  . LEFT HEART CATH AND CORONARY ANGIOGRAPHY N/A  02/15/2020   Procedure: LEFT HEART CATH AND CORONARY ANGIOGRAPHY;  Surgeon: Iran Ouch, MD;  Location: [facility];  Service: Cardiovascular;  Laterality: N/A;  . RIGHT HEART CATH N/A 02/15/2020   Procedure: RIGHT HEART CATH;  Surgeon: Iran Ouch, MD;  Location: [facility];  Service: Cardiovascular;  Laterality: N/A;  . VENTRICULAR ASSIST DEVICE INSERTION N/A 02/15/2020   Procedure: VENTRICULAR ASSIST DEVICE INSERTION;  Surgeon: Iran Ouch, MD;  Location: [facility];  Service: Cardiovascular;  Laterality: N/A;  iMPELLA    Past Medical History:  Diagnosis Date  . HTN (hypertension)   . Hypercholesteremia   . Hypertension    Borderline hypertension  . Sleep apnea   . Stroke [facility])    There were no vitals taken for this visit.  Opioid Risk Score:   Fall Risk Score:  `1  Depression screen PHQ 2/9  No flowsheet data found. Review of Systems  Neurological: Positive for weakness.       Weakness in the legs  All other systems reviewed and are negative.      Objective:   Physical Exam Vitals and nursing note reviewed.  Constitutional:      Appearance: Normal appearance.  Cardiovascular:     Rate and Rhythm: Normal rate and regular rhythm.     Pulses: Normal pulses.     Heart sounds: Normal heart sounds.  Musculoskeletal:     Cervical back: Normal range of motion and neck supple.     Comments: Normal Muscle Bulk and Muscle Testing Reveals:  Upper Extremities: Full ROM and Muscle Strength 5/5 Lower Extremities: Full ROM and Muscle Strength 5/5 Arises from Table with ease Narrow Based  Gait   Skin:    General: Skin is warm and dry.  Neurological:     Mental Status: He is alert and oriented to person, place, and time.  Psychiatric:        Mood and Affect: Mood normal.        Behavior: Behavior normal.           Assessment & Plan:  1. Ischemic cerebrovascular accidental of frontal lobe: Continue Outpatient Therapy at [facility]. Continue current medication regimen. He has a scheduled appointment with Neurology.  2.New Onset of Type 2 DM without long-term insulin use: Jeffrey Morse  Was instructed to F/U with his PCP. Jeffrey Morse verbalizes understanding.  3.Cardiac arrest ST Elevation Myocardial Infarction. He underwent: S/P by Dr. Kirke Corin and Dr Gala Romney LEFT HEART CATH AND CORONARY ANGIOGRAPHY N/A Local  VENTRICULAR ASSIST DEVICE INSERTION N/A Local  iMPELLA     CORONARY STENT INTERVENTION N/A Local  CFX    RIGHT HEART CATH     F/U with Dr Wynn Banker in 4- 6 weeks

## 2020-03-23 ENCOUNTER — Encounter: Payer: Self-pay | Admitting: Occupational Therapy

## 2020-03-23 ENCOUNTER — Ambulatory Visit: Payer: BC Managed Care – PPO | Admitting: Occupational Therapy

## 2020-03-23 ENCOUNTER — Ambulatory Visit: Payer: BC Managed Care – PPO

## 2020-03-23 ENCOUNTER — Other Ambulatory Visit: Payer: Self-pay

## 2020-03-23 VITALS — BP 112/88 | HR 73

## 2020-03-23 DIAGNOSIS — R278 Other lack of coordination: Secondary | ICD-10-CM

## 2020-03-23 DIAGNOSIS — R2689 Other abnormalities of gait and mobility: Secondary | ICD-10-CM

## 2020-03-23 DIAGNOSIS — M6281 Muscle weakness (generalized): Secondary | ICD-10-CM

## 2020-03-23 DIAGNOSIS — I69854 Hemiplegia and hemiparesis following other cerebrovascular disease affecting left non-dominant side: Secondary | ICD-10-CM

## 2020-03-23 DIAGNOSIS — R41840 Attention and concentration deficit: Secondary | ICD-10-CM

## 2020-03-23 DIAGNOSIS — R41842 Visuospatial deficit: Secondary | ICD-10-CM

## 2020-03-23 DIAGNOSIS — R262 Difficulty in walking, not elsewhere classified: Secondary | ICD-10-CM

## 2020-03-23 DIAGNOSIS — R41844 Frontal lobe and executive function deficit: Secondary | ICD-10-CM

## 2020-03-23 DIAGNOSIS — R2681 Unsteadiness on feet: Secondary | ICD-10-CM

## 2020-03-23 NOTE — Therapy (Signed)
[facility] [address] [place], [place], [address] Phone: [phone]   Fax:  [phone]  Physical Therapy Treatment  Patient Details  Name: Jeffrey Morse MRN: [id number] Date of Birth: [birth year] Referring Provider (PT): Charlton Amor, PA-C   Encounter Date: 03/23/2020   PT End of Session - 03/23/20 0848    Visit Number 3    Number of Visits 13    Date for PT Re-Evaluation 05/12/20   POC for 6 weeks, Cert for 60 days   Authorization Type BCBS    PT Start Time [phone]    PT Stop Time 0929    PT Time Calculation (min) 43 min    Equipment Utilized During Treatment Gait belt    Activity Tolerance Patient tolerated treatment well    Behavior During Therapy [facility] for tasks assessed/performed           Past Medical History:  Diagnosis Date  . HTN (hypertension)   . Hypercholesteremia   . Hypertension    Borderline hypertension  . Sleep apnea   . Stroke [facility])     Past Surgical History:  Procedure Laterality Date  . BACK SURGERY    . CARDIAC CATHETERIZATION    . CORONARY STENT INTERVENTION N/A 02/15/2020   Procedure: CORONARY STENT INTERVENTION;  Surgeon: Iran Ouch, MD;  [facility]: [facility];  Service: Cardiovascular;  Laterality: N/A;  CFX  . KNEE SURGERY Left 2012  . LEFT HEART CATH AND CORONARY ANGIOGRAPHY N/A 02/15/2020   Procedure: LEFT HEART CATH AND CORONARY ANGIOGRAPHY;  Surgeon: Iran Ouch, MD;  [facility]: [facility];  Service: Cardiovascular;  Laterality: N/A;  . RIGHT HEART CATH N/A 02/15/2020   Procedure: RIGHT HEART CATH;  Surgeon: Iran Ouch, MD;  Location: [facility];  Service: Cardiovascular;  Laterality: N/A;  . VENTRICULAR ASSIST DEVICE INSERTION N/A 02/15/2020   Procedure: VENTRICULAR ASSIST DEVICE INSERTION;  Surgeon: Iran Ouch, MD;  [facility]: [facility];  Service: Cardiovascular;  Laterality: N/A;  iMPELLA     Vitals:    03/23/20 0849  BP: 112/88  Pulse: 73     Subjective Assessment - 03/23/20 0847    Subjective Patient reports no new changes. No falls. Reports that BP has stayed good.    Patient is accompained by: Family member   Wife Teacher, adult education)   Pertinent History HTN, Hyperlipidemia    Limitations Walking;Standing    Diagnostic tests CT/MRI of the brain showed multiple small acute infarcts within the frontal lobes. Echocardiogram revealed severely reduced LV systolic function no evidence of pericardial effusion.  EKG after CPR showed evidence of inferior ST elevation with lateral involvement suggestive of left circumflex occlusion    Patient Stated Goals Build the strength    Currently in Pain? Yes    Pain Score 4     Pain Location Back    Pain Orientation Lower    Pain Descriptors / Indicators Aching    Pain Type Chronic pain              [facility] Adult PT Treatment/Exercise - 03/23/20 0917      Transfers   Transfers Sit to Stand;Stand to Sit    Sit to Stand 5: Supervision    Stand to Sit 5: Supervision    Comments completed sit <> stands with BLE placed on airex compleeted x 15 reps. require UE initially to push from mat, close supervision with completion.  Ambulation/Gait   Ambulation/Gait Yes    Ambulation/Gait Assistance 5: Supervision    Ambulation/Gait Assistance Details with high level balance    Assistive device None    Gait Pattern Step-through pattern;Decreased arm swing - left    Ambulation Surface Level;Indoor      High Level Balance   High Level Balance Activities Other (comment);Negotiating over obstacles   Self Newman Pies Toss with Dual Task   High Level Balance Comments Completed ambulation with toe tap to cone and following step over to promote SLS, completed x 2 laps down and back, on 3rd lap added addition of dual task including counting backwards by 3's. increased time required for cognitive task. Completed ambulation around therapy gym with self ball toss, completed x 400 ft  with naming vegetables/fruits start with letter provided by PT and counting task.               Balance Exercises - 03/23/20 0001      Balance Exercises: Standing   Rockerboard Anterior/posterior;Lateral;EO;EC;Intermittent UE support;Limitations    Rockerboard Limitations with board positioned A/P completed weight shifts x 15 reps without UE support, then with board steady completed EC 3 x 30 seconds, increased sway noted with vision removed. With board positioned laterally completed weight shifts to R/L x 15 reps without UE support. Attempted eyes closed 2 x 30 seconds, increased difficulty iwth lateral > A/P requiring intermittent UE support from // bars.    Balance Beam Completed standing on red balance beam without UE support eyes open x 1 minute, progressed to eyes closed 3 x 30 seconds. Then completed eyes open and horizontal/vertical head turns x 10 reps each direction.    Tandem Gait Forward;Intermittent upper extremity support;Foam/compliant surface;3 reps;Limitations    Tandem Gait Limitations completed initially x 1 lap down and back in // bars withotu UE support on firm surface, progressed to completion on complaint (blue balance beam) x 2 laps.    Sidestepping Foam/compliant support;3 reps;Limitations    Sidestepping Limitations completed sidestepping down and back on blue balance beam x 3 laps, no UE support.               PT Short Term Goals - 03/19/20 1636      PT SHORT TERM GOAL #1   Title Patient will be independent with initial HEP for balance/strengthening (ALL STGs Due: 04/03/2020)    Baseline no HEP established    Time 3    Period Weeks    Status New    Target Date 04/03/20      PT SHORT TERM GOAL #2   Title FGA to be assessed with LTG to be set as appropriate    Baseline 26/30 - LTG written as appropriate.    Time 3    Period Weeks    Status Achieved      PT SHORT TERM GOAL #3   Title Patient will improve 5x sit <> stand to </= 20 seconds without UE  support to demo improved balance    Baseline 23.52 secs    Time 3    Period Weeks    Status New             PT Long Term Goals - 03/19/20 1636      PT LONG TERM GOAL #1   Title Patient will be independent with Final HEP for balance/strengthening (All LTGs Due: 04/24/20)    Baseline no HEP established    Time 6    Period Weeks    Status  New      PT LONG TERM GOAL #2   Title Pt will improve FGA score to at least a 29/30 in order to demo decr fall risk.    Baseline 26/30 on 03/19/20    Time 6    Period Weeks    Status Revised      PT LONG TERM GOAL #3   Title Patient will improve 5x sit <> stand to </= 15 seconds to demonstrate improved balance and reduced fall risk    Baseline 23.52 secs w/o UE support    Time 6    Period Weeks    Status New      PT LONG TERM GOAL #4   Title Patient will be able to ascend/descend 8 stairs with reciprocal pattern and no rails to demonstrate improved ability to enter/exit home    Baseline 4 steps with recirpocal B rails    Time 6    Period Weeks    Status New      PT LONG TERM GOAL #5   Title Patient willl demo ability to ambulate >/= 1000 ft on outdoor various surfaces without AD and Mod I    Baseline 115 ft supervision indoors    Time 6    Period Weeks    Status New                 Plan - 03/23/20 2751    Clinical Impression Statement Today's skilled PT session focused on high level balance activites on various complaint surfaces and activites to promote SLS. Intermittent challenge noted with dual task, requiring verbal cues intemrittently. Overall patient tolerating all activities well throughout session wiht rest breaks as needed. Will continue to progress toward all LTGs.    Personal Factors and Comorbidities Comorbidity 1    Comorbidities HTN, Hyperlipidemia    Examination-Activity Limitations Stairs;Stand;Squat;Locomotion Level    Examination-Participation Restrictions Occupation;Yard Work;Community Activity;Driving     Stability/Clinical Decision Making Stable/Uncomplicated    Rehab Potential Good    PT Frequency 2x / week    PT Duration 6 weeks    PT Treatment/Interventions ADLs/Self Care Home Management;Cryotherapy;Moist Heat;DME Instruction;Gait training;Stair training;Functional mobility training;Therapeutic activities;Therapeutic exercise;Balance training;Neuromuscular re-education;Patient/family education;Orthotic Fit/Training;Manual techniques;Passive range of motion;Vestibular    PT Next Visit Plan add to HEP as appropriate. Continue high level balance, working on SLS. Dual Tasking activities. Functional BLE strengthening.    Consulted and Agree with Plan of Care Patient;Family member/caregiver    Family Member Consulted Wife [name])           Patient will benefit from skilled therapeutic intervention in order to improve the following deficits and impairments:  Abnormal gait,Decreased balance,Impaired vision/preception,Difficulty walking,Decreased endurance,Pain,Decreased strength,Decreased safety awareness,Decreased activity tolerance,Impaired perceived functional ability  Visit Diagnosis: Unsteadiness on feet  Muscle weakness (generalized)  Difficulty in walking, not elsewhere classified  Other abnormalities of gait and mobility     Problem List Patient Active Problem List   Diagnosis Date Noted  . Ischemic cerebrovascular accident (CVA) of frontal lobe (HCC) 02/28/2020  . Cerebral thrombosis with cerebral infarction 02/18/2020  . Subarachnoid hemorrhage 02/18/2020  . Cardiogenic shock (HCC) 02/15/2020  . Cardiac arrest (HCC)   . ST elevation myocardial infarction (STEMI) (HCC)   . Encounter for imaging study to confirm orogastric (OG) tube placement   . Acute respiratory failure with hypoxia (HCC)   . OSA (obstructive sleep apnea) 09/01/2016  . Hyperlipidemia 01/18/2012  . Transient global amnesia 01/17/2012    Tempie Donning, PT, DPT 03/23/2020, 9:51  AM  [facility] [address] [place], [place], [address] Phone: [phone]   Fax:  [phone]  Name: Jeffrey Morse MRN: [id number] Date of Birth: 07/22/1955

## 2020-03-23 NOTE — Therapy (Signed)
[facility] [address] [place], [place], [address] Phone: [phone]   Fax:  [phone]  Occupational Therapy Treatment  Patient Details  Name: Jeffrey Morse MRN: [id number] Date of Birth: 12/21/1955 Referring Provider (OT): Claudette Laws   Encounter Date: 03/23/2020   OT End of Session - 03/23/20 0802    Visit Number 3    Number of Visits 13    Date for OT Re-Evaluation 04/24/20    Authorization Type [employer]    Authorization Time Period VL:MN    OT Start Time 0802    OT Stop Time 0843    OT Time Calculation (min) 41 min    Activity Tolerance Patient tolerated treatment well    Behavior During Therapy [facility] for tasks assessed/performed           Past Medical History:  Diagnosis Date  . HTN (hypertension)   . Hypercholesteremia   . Hypertension    Borderline hypertension  . Sleep apnea   . Stroke [facility]     Past Surgical History:  Procedure Laterality Date  . BACK SURGERY    . CARDIAC CATHETERIZATION    . CORONARY STENT INTERVENTION N/A 02/15/2020   Procedure: CORONARY STENT INTERVENTION;  Surgeon: Iran Ouch, MD;  [facility]: [facility] INV[facility];  Service: Cardiovascular;  Laterality: N/A;  CFX  . KNEE SURGERY Left 2012  . LEFT HEART CATH AND CORONARY ANGIOGRAPHY N/A 02/15/2020   Procedure: LEFT HEART CATH AND CORONARY ANGIOGRAPHY;  Surgeon: Iran Ouch, MD;  [facility]: [facility];  Service: Cardiovascular;  Laterality: N/A;  . RIGHT HEART CATH N/A 02/15/2020   Procedure: RIGHT HEART CATH;  Surgeon: Iran Ouch, MD;  [facility]: [facility];  Service: Cardiovascular;  Laterality: N/A;  . VENTRICULAR ASSIST DEVICE INSERTION N/A 02/15/2020   Procedure: VENTRICULAR ASSIST DEVICE INSERTION;  Surgeon: Iran Ouch, MD;  [facility]: [facility];  Service: Cardiovascular;  Laterality: N/A;  iMPELLA     There were no vitals filed for this visit.   Subjective  Assessment - 03/23/20 0802    Subjective  Just normal pain in my knee and back since we walked a lot yesterday.    Patient is accompanied by: Family member    Pertinent History PMH: HLD, HTN    Limitations fall risk. not currently driving. monitor blood pressure    Patient Stated Goals "build my strength" driving and back to work    Currently in Pain? Yes    Pain Score 4     Pain Location Back   and knee   Pain Orientation Right;Lower    Pain Descriptors / Indicators Aching    Pain Type Chronic pain                        OT Treatments/Exercises (OP) - 03/23/20 0818      ADLs   Driving discussed back to driving and recommendations. Mentioned getting clearance from physician for driving and only concern would be acuity for vision since still having some blurry vision for far away. Pt and spouse verbalized understanding      Exercises   Exercises Hand      Cognitive Exercises   Attention Span Alternating ambulating around gym while tossing small hacky sack with min-mod cues for word finding. Pt continued walking and tossing ball consistently with thinking of words.      Hand Exercises   Other Hand Exercises Hand  gripper level 3 with black spring with LUE and picking up 1 inch blocks for overall conditioning and strengthening      Visual/Perceptual Exercises   Scanning Environmental    Scanning - Environmental 13/15 on first pass. Pt req min cues for 1 on 2nd pass to locate.      Functional Reaching Activities   High Level placing 1 inch blocks on top of mirror (approx 6') for conditioning and standing tolerance and balance. no los of balance during task                    OT Short Term Goals - 03/23/20 0806      OT SHORT TERM GOAL #1   Title Pt will be independent with HEP 04/03/2020    Time 3    Period Weeks    Status On-going    Target Date 04/03/20      OT SHORT TERM GOAL #2   Title Pt will complete environmental scanning in a moderate to max  distracting environment with 100% accuracy    Baseline 75% accuracy    Time 3    Period Weeks    Status On-going   13/15     OT SHORT TERM GOAL #3   Title Pt will demonstrate improved activity tolerance by completing standing exercises for 15 minutes or greater with no break    Time 3    Period Weeks    Status Achieved   went grocery shopping and walking around the neighborhood with no issues     OT SHORT TERM GOAL #4   Title Continue to assess visual deficits and set goal PRN    Time 3    Period Weeks    Status New             OT Long Term Goals - 03/23/20 2376      OT LONG TERM GOAL #1   Title Pt will be independent with updated HEP 04/24/20    Time 6    Period Weeks    Status New      OT LONG TERM GOAL #2   Title Pt will perform physical and cognitive tasks simultaneously with 95% accuracy    Time 6    Period Weeks    Status On-going   75%  accuracy with word finding.     OT LONG TERM GOAL #3   Title Pt and caregiver will verbalize understanding for return to driving recommendations    Time 6    Period Weeks    Status On-going      OT LONG TERM GOAL #4   Title Pt will improve activity tolerance by completing standing activities for 20 minutes or greater consecutively in order to return to playing with grandchildren.    Time 6    Period Weeks    Status On-going   tires out with bathing but has tolerated grocery shopping and walking around neighborhood for > 30 minutes.                Plan - 03/23/20 0803    Clinical Impression Statement Pt is making good progress towards goals.    OT Occupational Profile and History Problem Focused Assessment - Including review of records relating to presenting problem    Occupational performance deficits (Please refer to evaluation for details): IADL's;ADL's;Work;Leisure    Body Structure / Function / Physical Skills ADL;UE functional use;Decreased knowledge of use of  DME;Balance;FMC;Vision;Dexterity;GMC;Strength;IADL;Endurance    Cognitive Skills Understand;Problem  Solve;Learn;Memory    Rehab Potential Good    Clinical Decision Making Limited treatment options, no task modification necessary    Comorbidities Affecting Occupational Performance: None    Modification or Assistance to Complete Evaluation  No modification of tasks or assist necessary to complete eval    OT Frequency 2x / week    OT Duration 6 weeks   or 12 visits + evaluation   OT Treatment/Interventions Self-care/ADL training;Therapeutic exercise;Visual/perceptual remediation/compensation;Patient/family education;Neuromuscular education;Energy conservation;Building services engineer;Therapeutic activities;DME and/or AE instruction;Cognitive remediation/compensation    Plan proximal strengthening, activity tolerance    Consulted and Agree with Plan of Care Patient;Family member/caregiver    Family Member Consulted spouse, [name]           Patient will benefit from skilled therapeutic intervention in order to improve the following deficits and impairments:   Body Structure / Function / Physical Skills: ADL,UE functional use,Decreased knowledge of use of DME,Balance,FMC,Vision,Dexterity,GMC,Strength,IADL,Endurance Cognitive Skills: Rich Fuchs     Visit Diagnosis: Visuospatial deficit  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 Ischemic cerebrovascular accident (CVA) of frontal lobe (HCC) 02/28/2020  . Cerebral thrombosis with cerebral infarction 02/18/2020  . Subarachnoid hemorrhage 02/18/2020  . Cardiogenic shock (HCC) 02/15/2020  . Cardiac arrest (HCC)   . ST elevation  myocardial infarction (STEMI) (HCC)   . Encounter for imaging study to confirm orogastric (OG) tube placement   . Acute respiratory failure with hypoxia (HCC)   . OSA (obstructive sleep apnea) 09/01/2016  . Hyperlipidemia 01/18/2012  . Transient global amnesia 01/17/2012    Junious Dresser MOT, OTR/L  03/23/2020, 8:52 AM  [facility] [address] [place], [place], [address] Phone: [phone]   Fax:  [phone]  Name: Jeffrey Morse MRN: [id number] Date of Birth: 07/14/1955

## 2020-03-27 ENCOUNTER — Telehealth: Payer: Self-pay

## 2020-03-27 NOTE — Telephone Encounter (Signed)
He was seen as a Transitional Care Visit. Please have Jeffrey Morse call his primary care doctor.

## 2020-03-27 NOTE — Telephone Encounter (Signed)
Patient called stating he has some sores on the back of his head. He states it looks bad and wanted to see if he can get a referral to the wound center. You saw him last week.

## 2020-03-28 ENCOUNTER — Ambulatory Visit: Payer: BC Managed Care – PPO | Admitting: Occupational Therapy

## 2020-03-28 ENCOUNTER — Ambulatory Visit: Payer: BC Managed Care – PPO | Admitting: Physical Therapy

## 2020-03-28 ENCOUNTER — Encounter: Payer: Self-pay | Admitting: Physical Therapy

## 2020-03-28 ENCOUNTER — Other Ambulatory Visit: Payer: Self-pay

## 2020-03-28 VITALS — BP 120/86 | HR 69

## 2020-03-28 DIAGNOSIS — R2689 Other abnormalities of gait and mobility: Secondary | ICD-10-CM | POA: Diagnosis not present

## 2020-03-28 DIAGNOSIS — I69854 Hemiplegia and hemiparesis following other cerebrovascular disease affecting left non-dominant side: Secondary | ICD-10-CM

## 2020-03-28 DIAGNOSIS — M6281 Muscle weakness (generalized): Secondary | ICD-10-CM

## 2020-03-28 DIAGNOSIS — R2681 Unsteadiness on feet: Secondary | ICD-10-CM

## 2020-03-28 NOTE — Therapy (Signed)
[facility] [address] [place], [place], [address] Phone: [phone]   Fax:  [phone]  Occupational Therapy Treatment  Patient Details  Name: Jeffrey Morse MRN: [id number] Date of Birth: [birth year] Referring Provider (OT): Claudette Laws   Encounter Date: 03/28/2020   OT End of Session - 03/28/20 1516    Visit Number 4    Number of Visits 13    Date for OT Re-Evaluation 04/24/20    Authorization Type [employer]    Authorization Time Period VL:MN    OT Start Time 1315    OT Stop Time 1400    OT Time Calculation (min) 45 min    Activity Tolerance Patient tolerated treatment well    Behavior During Therapy [facility] for tasks assessed/performed           Past Medical History:  Diagnosis Date  . HTN (hypertension)   . Hypercholesteremia   . Hypertension    Borderline hypertension  . Sleep apnea   . Stroke [facility])     Past Surgical History:  Procedure Laterality Date  . BACK SURGERY    . CARDIAC CATHETERIZATION    . CORONARY STENT INTERVENTION N/A 02/15/2020   Procedure: CORONARY STENT INTERVENTION;  Surgeon: Iran Ouch, MD;  [facility];  Service: Cardiovascular;  Laterality: N/A;  CFX  . KNEE SURGERY Left 2012  . LEFT HEART CATH AND CORONARY ANGIOGRAPHY N/A 02/15/2020   Procedure: LEFT HEART CATH AND CORONARY ANGIOGRAPHY;  Surgeon: Iran Ouch, MD;  [facility];  Service: Cardiovascular;  Laterality: N/A;  . RIGHT HEART CATH N/A 02/15/2020   Procedure: RIGHT HEART CATH;  Surgeon: Iran Ouch, MD;  [facility]: [facility];  Service: Cardiovascular;  Laterality: N/A;  . VENTRICULAR ASSIST DEVICE INSERTION N/A 02/15/2020   Procedure: VENTRICULAR ASSIST DEVICE INSERTION;  Surgeon: Iran Ouch, MD;  [facility];  Service: Cardiovascular;  Laterality: N/A;  iMPELLA     There were no vitals filed for this visit.   Subjective  Assessment - 03/28/20 1311    Subjective  My back is feeling better today    Patient is accompanied by: Family member    Pertinent History PMH: HLD, HTN    Limitations fall risk. not currently driving. monitor blood pressure    Patient Stated Goals "build my strength" driving and back to work    Currently in Pain? No/denies           Wall push ups x 10 for scapula and proximal sh girdle strengthening. BUE AA/ROM in high flexion rolling ball along wall followed by scapula retraction w/ high level sh flexion alternating UE's along wall.  Prone: scapula retraction w/ arms by side - progressed to scapula retraction with arms in abduction (bent) x 10 w/ tactile cues for Lt scapula. Prone on elbows: chest lift for scapula depression.  Modified quadraped in standing (d/t past knee surgery) with cat/cow stretch and A/P wt shifts. Also modified due to wrist pain.  UBE x 6 min, level 3 for UE strength/endurance LUE functional reaching w/ 1 lb weight to place clothespins on antenna and remove; and to screw/unscrew nuts/bolts in high level sh flexion                     OT Education - 03/28/20 1350    Education Details additional HEP for scapula strengthening/stabilization    Person(s) Educated Patient;Spouse    Methods  Explanation;Demonstration;Verbal cues;Handout    Comprehension Returned demonstration;Verbalized understanding            OT Short Term Goals - 03/23/20 0806      OT SHORT TERM GOAL #1   Title Pt will be independent with HEP 04/03/2020    Time 3    Period Weeks    Status On-going    Target Date 04/03/20      OT SHORT TERM GOAL #2   Title Pt will complete environmental scanning in a moderate to max distracting environment with 100% accuracy    Baseline 75% accuracy    Time 3    Period Weeks    Status On-going   13/15     OT SHORT TERM GOAL #3   Title Pt will demonstrate improved activity tolerance by completing standing exercises for 15 minutes or  greater with no break    Time 3    Period Weeks    Status Achieved   went grocery shopping and walking around the neighborhood with no issues     OT SHORT TERM GOAL #4   Title Continue to assess visual deficits and set goal PRN    Time 3    Period Weeks    Status New             OT Long Term Goals - 03/23/20 0865      OT LONG TERM GOAL #1   Title Pt will be independent with updated HEP 04/24/20    Time 6    Period Weeks    Status New      OT LONG TERM GOAL #2   Title Pt will perform physical and cognitive tasks simultaneously with 95% accuracy    Time 6    Period Weeks    Status On-going   75%  accuracy with word finding.     OT LONG TERM GOAL #3   Title Pt and caregiver will verbalize understanding for return to driving recommendations    Time 6    Period Weeks    Status On-going      OT LONG TERM GOAL #4   Title Pt will improve activity tolerance by completing standing activities for 20 minutes or greater consecutively in order to return to playing with grandchildren.    Time 6    Period Weeks    Status On-going   tires out with bathing but has tolerated grocery shopping and walking around neighborhood for > 30 minutes.                Plan - 03/28/20 1517    Clinical Impression Statement Pt overall demo good BUE ROM but lacks end range sh flexion and strength LUE.    OT Occupational Profile and History Problem Focused Assessment - Including review of records relating to presenting problem    Occupational performance deficits (Please refer to evaluation for details): IADL's;ADL's;Work;Leisure    Body Structure / Function / Physical Skills ADL;UE functional use;Decreased knowledge of use of DME;Balance;FMC;Vision;Dexterity;GMC;Strength;IADL;Endurance    Cognitive Skills Understand;Problem Solve;Learn;Memory    Rehab Potential Good    Clinical Decision Making Limited treatment options, no task modification necessary    Comorbidities Affecting Occupational  Performance: None    Modification or Assistance to Complete Evaluation  No modification of tasks or assist necessary to complete eval    OT Frequency 2x / week    OT Duration 6 weeks   or 12 visits + evaluation   OT Treatment/Interventions Self-care/ADL training;Therapeutic exercise;Visual/perceptual  remediation/compensation;Patient/family education;Neuromuscular education;Energy conservation;Building services engineer;Therapeutic activities;DME and/or AE instruction;Cognitive remediation/compensation    Plan Continue endurance/standing tolerance, dual taksing, review HEP's for shoulder prn    Consulted and Agree with Plan of Care Patient;Family member/caregiver    Family Member Consulted spouse, [name]           Patient will benefit from skilled therapeutic intervention in order to improve the following deficits and impairments:   Body Structure / Function / Physical Skills: ADL,UE functional use,Decreased knowledge of use of DME,Balance,FMC,Vision,Dexterity,GMC,Strength,IADL,Endurance Cognitive Skills: Rich Fuchs     Visit Diagnosis: 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 Ischemic cerebrovascular accident (CVA) of frontal lobe (HCC) 02/28/2020  . Cerebral thrombosis with cerebral infarction 02/18/2020  . Subarachnoid hemorrhage 02/18/2020  . Cardiogenic shock (HCC) 02/15/2020  . Cardiac arrest (HCC)   . ST elevation myocardial infarction (STEMI) (HCC)   . Encounter for imaging study to confirm orogastric (OG) tube placement   . Acute respiratory failure with hypoxia (HCC)   . OSA (obstructive sleep apnea) 09/01/2016  . Hyperlipidemia 01/18/2012  . Transient global amnesia 01/17/2012    Kelli Churn, OTR/L 03/28/2020, 3:19 PM  [address] [facility] [address] [place], [place], [address] Phone: [phone]   Fax:  [phone]  Name: Jeffrey Morse MRN: [id number] Date of Birth: 01-06-1956

## 2020-03-28 NOTE — Therapy (Signed)
[facility] [address] [place], [place], [address] Phone: [phone]   Fax:  [phone]  Physical Therapy Treatment  Patient Details  Name: Jeffrey Morse MRN: [id number] Date of Birth: Nov 29, 1955 Referring Provider (PT): Charlton Amor, PA-C   Encounter Date: 03/28/2020   PT End of Session - 03/28/20 1443    Visit Number 4    Number of Visits 13    Date for PT Re-Evaluation 05/12/20   POC for 6 weeks, Cert for 60 days   Authorization Type BCBS    PT Start Time 1401    PT Stop Time 1442    PT Time Calculation (min) 41 min    Equipment Utilized During Treatment Gait belt    Activity Tolerance Patient tolerated treatment well    Behavior During Therapy [facility] for tasks assessed/performed           Past Medical History:  Diagnosis Date  . HTN (hypertension)   . Hypercholesteremia   . Hypertension    Borderline hypertension  . Sleep apnea   . Stroke [facility])     Past Surgical History:  Procedure Laterality Date  . BACK SURGERY    . CARDIAC CATHETERIZATION    . CORONARY STENT INTERVENTION N/A 02/15/2020   Procedure: CORONARY STENT INTERVENTION;  Surgeon: Iran Ouch, MD;  [facility]: [facility] IN[facility];  Service: Cardiovascular;  Laterality: N/A;  CFX  . KNEE SURGERY Left 2012  . LEFT HEART CATH AND CORONARY ANGIOGRAPHY N/A 02/15/2020   Procedure: LEFT HEART CATH AND CORONARY ANGIOGRAPHY;  Surgeon: Iran Ouch, MD;  Location: [facility];  Service: Cardiovascular;  Laterality: N/A;  . RIGHT HEART CATH N/A 02/15/2020   Procedure: RIGHT HEART CATH;  Surgeon: Iran Ouch, MD;  [facility]: [facility];  Service: Cardiovascular;  Laterality: N/A;  . VENTRICULAR ASSIST DEVICE INSERTION N/A 02/15/2020   Procedure: VENTRICULAR ASSIST DEVICE INSERTION;  Surgeon: Iran Ouch, MD;  [facility]: [facility];  Service: Cardiovascular;  Laterality: N/A;  iMPELLA     Vitals:    03/28/20 1404  BP: 120/86  Pulse: 69     Subjective Assessment - 03/28/20 1402    Subjective No changes, no falls. Exercises going well. Has a sore on the back of his head (was there and assessed in the hospital), but pt reports the scab is coming off and looks worse. Pt's wife has notified pt's PCP and has sent a picture.    Patient is accompained by: Family member   Wife Teacher, adult education)   Pertinent History HTN, Hyperlipidemia    Limitations Walking;Standing    Diagnostic tests CT/MRI of the brain showed multiple small acute infarcts within the frontal lobes. Echocardiogram revealed severely reduced LV systolic function no evidence of pericardial effusion.  EKG after CPR showed evidence of inferior ST elevation with lateral involvement suggestive of left circumflex occlusion    Patient Stated Goals Build the strength    Currently in Pain? No/denies                                  Balance Exercises - 03/28/20 1426      Balance Exercises: Standing   SLS Eyes open;Foam/compliant surface;Limitations    SLS Limitations on rockerboard in A/P direction: alternating toe taps to forward cone x10 reps B, min guard for balance as needed, cues for standing tall on stance leg  Rockerboard Anterior/posterior;Lateral;EO;EC;Intermittent UE support;Limitations    Rockerboard Limitations with board positioned A/P with board steady x 30 seconds, increased sway noted with vision removed, then eyes closed 2 x 5 reps head turns with min guard/min A for balance. Attempted eyes closed 3 x 30 seconds, increased difficulty iwth lateral > A/P requiring intermittent UE support from // bars and min A from therapist.    Step Over Hurdles / Cones on blue/red mats stepping over 4 obstacles of 2 heights - first performing with step to pattern, then reciprocal down and back x2 reps, then adding in cognitive challenge down and back x2 reps -naming foods in alphabetical order with pt slowing down speed with  dual tasking    Marching Foam/compliant surface;Forwards;Limitations    Marching Limitations forward marching down and back x1 rep, then adding in alternating arm raise down and back x2 reps for additional challenge for coordination, intermittent UE support   Other Standing Exercises retro marching down and back 3 reps on blue/red mats, cues for incr step height/length   Other Standing Exercises Comments standing on blue foam balance beam: wide BOS weight shifting and lifting contralateral leg for SLS x6 reps B, min guard/min A for balance               PT Short Term Goals - 03/19/20 1636      PT SHORT TERM GOAL #1   Title Patient will be independent with initial HEP for balance/strengthening (ALL STGs Due: 04/03/2020)    Baseline no HEP established    Time 3    Period Weeks    Status New    Target Date 04/03/20      PT SHORT TERM GOAL #2   Title FGA to be assessed with LTG to be set as appropriate    Baseline 26/30 - LTG written as appropriate.    Time 3    Period Weeks    Status Achieved      PT SHORT TERM GOAL #3   Title Patient will improve 5x sit <> stand to </= 20 seconds without UE support to demo improved balance    Baseline 23.52 secs    Time 3    Period Weeks    Status New             PT Long Term Goals - 03/19/20 1636      PT LONG TERM GOAL #1   Title Patient will be independent with Final HEP for balance/strengthening (All LTGs Due: 04/24/20)    Baseline no HEP established    Time 6    Period Weeks    Status New      PT LONG TERM GOAL #2   Title Pt will improve FGA score to at least a 29/30 in order to demo decr fall risk.    Baseline 26/30 on 03/19/20    Time 6    Period Weeks    Status Revised      PT LONG TERM GOAL #3   Title Patient will improve 5x sit <> stand to </= 15 seconds to demonstrate improved balance and reduced fall risk    Baseline 23.52 secs w/o UE support    Time 6    Period Weeks    Status New      PT LONG TERM GOAL #4    Title Patient will be able to ascend/descend 8 stairs with reciprocal pattern and no rails to demonstrate improved ability to enter/exit home    Baseline 4 steps  with recirpocal B rails    Time 6    Period Weeks    Status New      PT LONG TERM GOAL #5   Title Patient willl demo ability to ambulate >/= 1000 ft on outdoor various surfaces without AD and Mod I    Baseline 115 ft supervision indoors    Time 6    Period Weeks    Status New                 Plan - 03/28/20 1444    Clinical Impression Statement Continued to focus on high level balance on compliant surfaces with vestibular input for balance, dual tasking, and SLS. Pt most challenged my SLS activities on LLE and when having to lift LLE up. Pt tolerated session well, will continue to progress towards LTGs.    Personal Factors and Comorbidities Comorbidity 1    Comorbidities HTN, Hyperlipidemia    Examination-Activity Limitations Stairs;Stand;Squat;Locomotion Level    Examination-Participation Restrictions Occupation;Yard Work;Community Activity;Driving    Stability/Clinical Decision Making Stable/Uncomplicated    Rehab Potential Good    PT Frequency 2x / week    PT Duration 6 weeks    PT Treatment/Interventions ADLs/Self Care Home Management;Cryotherapy;Moist Heat;DME Instruction;Gait training;Stair training;Functional mobility training;Therapeutic activities;Therapeutic exercise;Balance training;Neuromuscular re-education;Patient/family education;Orthotic Fit/Training;Manual techniques;Passive range of motion;Vestibular    PT Next Visit Plan add to HEP as appropriate. Continue high level balance, working on SLS, eyes closed,  Dual Tasking activities. Functional BLE strengthening.    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 Ischemic cerebrovascular accident (CVA) of frontal lobe (HCC) 02/28/2020  . Cerebral thrombosis with cerebral infarction 02/18/2020  . Subarachnoid hemorrhage 02/18/2020  . Cardiogenic shock (HCC) 02/15/2020  . Cardiac arrest (HCC)   . ST elevation myocardial infarction (STEMI) (HCC)   . Encounter for imaging study to confirm orogastric (OG) tube placement   . Acute respiratory failure with hypoxia (HCC)   . OSA (obstructive sleep apnea) 09/01/2016  . Hyperlipidemia 01/18/2012  . Transient global amnesia 01/17/2012    Drake Leach , PT, DPT  03/28/2020, 2:45 PM  [facility] [address] [place], [place], [address] Phone: [phone]   Fax:  [phone]  Name: CHAZ MCGLASSON MRN: [id number] Date of Birth: 1956/01/04

## 2020-03-28 NOTE — Telephone Encounter (Signed)
I have let him know to contact his primary care about the sores on his head.

## 2020-03-28 NOTE — Patient Instructions (Signed)
Triceps Activities: Push-Ups    Lean on wall with straightened arms. Bend arms and touch nose to wall. Do not move feet. Stomach should not touch wall. Push away by straightening arms. Keep trunk straight. Do not let fingers point inward. Repeat 10 times, 1-2x/day  Scapular Retraction: Abduction (Prone)    Lie with upper arms straight out from sides, elbows bent to 90. Pinch shoulder blades together and raise arms a few inches from floor. Repeat _10___ times per set.  Do __2__ sessions per day.

## 2020-03-30 ENCOUNTER — Ambulatory Visit: Payer: BC Managed Care – PPO | Admitting: Occupational Therapy

## 2020-03-30 ENCOUNTER — Ambulatory Visit: Payer: BC Managed Care – PPO

## 2020-03-30 ENCOUNTER — Other Ambulatory Visit: Payer: Self-pay

## 2020-03-30 VITALS — BP 128/78

## 2020-03-30 DIAGNOSIS — R278 Other lack of coordination: Secondary | ICD-10-CM

## 2020-03-30 DIAGNOSIS — M6281 Muscle weakness (generalized): Secondary | ICD-10-CM

## 2020-03-30 DIAGNOSIS — R2681 Unsteadiness on feet: Secondary | ICD-10-CM

## 2020-03-30 DIAGNOSIS — R41842 Visuospatial deficit: Secondary | ICD-10-CM

## 2020-03-30 DIAGNOSIS — R41844 Frontal lobe and executive function deficit: Secondary | ICD-10-CM

## 2020-03-30 DIAGNOSIS — R2689 Other abnormalities of gait and mobility: Secondary | ICD-10-CM

## 2020-03-30 DIAGNOSIS — R41840 Attention and concentration deficit: Secondary | ICD-10-CM

## 2020-03-30 DIAGNOSIS — I69854 Hemiplegia and hemiparesis following other cerebrovascular disease affecting left non-dominant side: Secondary | ICD-10-CM

## 2020-03-30 NOTE — Therapy (Signed)
[facility] [address] [place], [place], [address] Phone: [phone]   Fax:  [phone]  Physical Therapy Treatment  Patient Details  Name: Jeffrey Morse MRN: [id number] Date of Birth: May 25, 1955 Referring Provider (PT): Charlton Amor, PA-C   Encounter Date: 03/30/2020   PT End of Session - 03/30/20 0849    Visit Number 5    Number of Visits 13    Date for PT Re-Evaluation 05/12/20   POC for 6 weeks, Cert for 60 days   Authorization Type BCBS    PT Start Time [phone]    PT Stop Time 0926    PT Time Calculation (min) 40 min    Equipment Utilized During Treatment Gait belt    Activity Tolerance Patient tolerated treatment well    Behavior During Therapy [facility] for tasks assessed/performed           Past Medical History:  Diagnosis Date  . HTN (hypertension)   . Hypercholesteremia   . Hypertension    Borderline hypertension  . Sleep apnea   . Stroke [facility])     Past Surgical History:  Procedure Laterality Date  . BACK SURGERY    . CARDIAC CATHETERIZATION    . CORONARY STENT INTERVENTION N/A 02/15/2020   Procedure: CORONARY STENT INTERVENTION;  Surgeon: Iran Ouch, MD;  [facility];  Service: Cardiovascular;  Laterality: N/A;  CFX  . KNEE SURGERY Left 2012  . LEFT HEART CATH AND CORONARY ANGIOGRAPHY N/A 02/15/2020   Procedure: LEFT HEART CATH AND CORONARY ANGIOGRAPHY;  Surgeon: Iran Ouch, MD;  [facility];  Service: Cardiovascular;  Laterality: N/A;  . RIGHT HEART CATH N/A 02/15/2020   Procedure: RIGHT HEART CATH;  Surgeon: Iran Ouch, MD;  [facility];  Service: Cardiovascular;  Laterality: N/A;  . VENTRICULAR ASSIST DEVICE INSERTION N/A 02/15/2020   Procedure: VENTRICULAR ASSIST DEVICE INSERTION;  Surgeon: Iran Ouch, MD;  [facility];  Service: Cardiovascular;  Laterality: N/A;  iMPELLA     Vitals:    03/30/20 0850  BP: 128/78     Subjective Assessment - 03/30/20 0850    Subjective Pt reports that he is having some low back pain and knee pain today of chronic nature. He had MRI on back recently.    Patient is accompained by: Family member   Wife Teacher, adult education)   Pertinent History HTN, Hyperlipidemia    Limitations Walking;Standing    Diagnostic tests CT/MRI of the brain showed multiple small acute infarcts within the frontal lobes. Echocardiogram revealed severely reduced LV systolic function no evidence of pericardial effusion.  EKG after CPR showed evidence of inferior ST elevation with lateral involvement suggestive of left circumflex occlusion    Patient Stated Goals Build the strength    Currently in Pain? Yes    Pain Score 4     Pain Location Back    Pain Orientation Lower    Pain Descriptors / Indicators Aching    Pain Type Chronic pain    Pain Onset More than a month ago    Pain Frequency Intermittent    Pain Relieving Factors stretching, lying down    Multiple Pain Sites Yes    Pain Score 3    Pain Location Knee    Pain Orientation Right    Pain Descriptors / Indicators Aching    Pain Type Chronic pain    Pain Onset More than a month ago  Pain Frequency Intermittent                             [facility] Adult PT Treatment/Exercise - 03/30/20 0851      Ambulation/Gait   Ambulation/Gait Yes    Ambulation/Gait Assistance 5: Supervision    Ambulation/Gait Assistance Details around in clinic between activities    Assistive device None    Gait Pattern Step-through pattern    Ambulation Surface Level;Indoor      Neuro Re-ed    Neuro Re-ed Details  In // bars: standing on airex feet together x 30 sec eyes open then 30 sec eyes closed, then alternating toe taps on cone x 20. Walking over blue mat marching 6' x 6. Tandem gait 6' x 6. Dynamic gait activities over obstables: reciprocal steps over 5 hurdles of varied height, stepping on 6 stepping stones, marching  over floor ladder x 2 laps then adding in diagonal steps over floor ladder x 2 laps. Alternating taps on soccerball x 10, SLS with 1 foot on soccerball moving ant/post and then side to side x 10 each side. Dribbling soccerball around track 230' keeping close to feet throughout. Close SBA with activities. Pt had to touch // bars a couple times with tandem.                  PT Education - 03/30/20 1241    Education Details Pt to continue with current HEP    Person(s) Educated Patient    Methods Explanation    Comprehension Verbalized understanding            PT Short Term Goals - 03/19/20 1636      PT SHORT TERM GOAL #1   Title Patient will be independent with initial HEP for balance/strengthening (ALL STGs Due: 04/03/2020)    Baseline no HEP established    Time 3    Period Weeks    Status New    Target Date 04/03/20      PT SHORT TERM GOAL #2   Title FGA to be assessed with LTG to be set as appropriate    Baseline 26/30 - LTG written as appropriate.    Time 3    Period Weeks    Status Achieved      PT SHORT TERM GOAL #3   Title Patient will improve 5x sit <> stand to </= 20 seconds without UE support to demo improved balance    Baseline 23.52 secs    Time 3    Period Weeks    Status New             PT Long Term Goals - 03/19/20 1636      PT LONG TERM GOAL #1   Title Patient will be independent with Final HEP for balance/strengthening (All LTGs Due: 04/24/20)    Baseline no HEP established    Time 6    Period Weeks    Status New      PT LONG TERM GOAL #2   Title Pt will improve FGA score to at least a 29/30 in order to demo decr fall risk.    Baseline 26/30 on 03/19/20    Time 6    Period Weeks    Status Revised      PT LONG TERM GOAL #3   Title Patient will improve 5x sit <> stand to </= 15 seconds to demonstrate improved balance and reduced fall risk  Baseline 23.52 secs w/o UE support    Time 6    Period Weeks    Status New      PT LONG TERM  GOAL #4   Title Patient will be able to ascend/descend 8 stairs with reciprocal pattern and no rails to demonstrate improved ability to enter/exit home    Baseline 4 steps with recirpocal B rails    Time 6    Period Weeks    Status New      PT LONG TERM GOAL #5   Title Patient willl demo ability to ambulate >/= 1000 ft on outdoor various surfaces without AD and Mod I    Baseline 115 ft supervision indoors    Time 6    Period Weeks    Status New                 Plan - 03/30/20 1242    Clinical Impression Statement PT continued to progress balance activities with more SLS tasks on varied surfaces. Increased sway with vision removed on compliant surfaces. No increase back and knee pain reported during session.    Personal Factors and Comorbidities Comorbidity 1    Comorbidities HTN, Hyperlipidemia    Examination-Activity Limitations Stairs;Stand;Squat;Locomotion Level    Examination-Participation Restrictions Occupation;Yard Work;Community Activity;Driving    Stability/Clinical Decision Making Stable/Uncomplicated    Rehab Potential Good    PT Frequency 2x / week    PT Duration 6 weeks    PT Treatment/Interventions ADLs/Self Care Home Management;Cryotherapy;Moist Heat;DME Instruction;Gait training;Stair training;Functional mobility training;Therapeutic activities;Therapeutic exercise;Balance training;Neuromuscular re-education;Patient/family education;Orthotic Fit/Training;Manual techniques;Passive range of motion;Vestibular    PT Next Visit Plan STG check due next visit. add to HEP as appropriate. Continue high level balance, working on SLS, eyes closed,  Dual Tasking activities. Functional BLE strengthening.    Consulted and Agree with Plan of Care Patient;Family member/caregiver    Family Member Consulted Wife [name])           Patient will benefit from skilled therapeutic intervention in order to improve the following deficits and impairments:  Abnormal gait,Decreased  balance,Impaired vision/preception,Difficulty walking,Decreased endurance,Pain,Decreased strength,Decreased safety awareness,Decreased activity tolerance,Impaired perceived functional ability  Visit Diagnosis: Other abnormalities of gait and mobility  Muscle weakness (generalized)     Problem List Patient Active Problem List   Diagnosis Date Noted  . Ischemic cerebrovascular accident (CVA) of frontal lobe (HCC) 02/28/2020  . Cerebral thrombosis with cerebral infarction 02/18/2020  . Subarachnoid hemorrhage 02/18/2020  . Cardiogenic shock (HCC) 02/15/2020  . Cardiac arrest (HCC)   . ST elevation myocardial infarction (STEMI) (HCC)   . Encounter for imaging study to confirm orogastric (OG) tube placement   . Acute respiratory failure with hypoxia (HCC)   . OSA (obstructive sleep apnea) 09/01/2016  . Hyperlipidemia 01/18/2012  . Transient global amnesia 01/17/2012    Ronn Melena, PT, DPT, NCS 03/30/2020, 12:44 PM  [facility] [address] [place], [place], [address] Phone: [phone]   Fax:  [phone]  Name: Jeffrey Morse MRN: [id number] Date of Birth: July 08, 1955

## 2020-03-30 NOTE — Therapy (Signed)
[facility] [address] [place], [place], [address] Phone: [phone]   Fax:  [phone]  Occupational Therapy Treatment  Patient Details  Name: Jeffrey Morse MRN: [id number] Date of Birth: 1955/10/11 Referring Provider (OT): Alysia Penna   Encounter Date: 03/30/2020   OT End of Session - 03/30/20 0803    Visit Number 5    Number of Visits 13    Date for OT Re-Evaluation 04/24/20    Authorization Type [employer]-[place]    Authorization Time Period VL:MN    OT Start Time 0802    OT Stop Time 0845    OT Time Calculation (min) 43 min    Activity Tolerance Patient tolerated treatment well    Behavior During Therapy [facility] for tasks assessed/performed           Past Medical History:  Diagnosis Date  . HTN (hypertension)   . Hypercholesteremia   . Hypertension    Borderline hypertension  . Sleep apnea   . Stroke [facility])     Past Surgical History:  Procedure Laterality Date  . BACK SURGERY    . CARDIAC CATHETERIZATION    . CORONARY STENT INTERVENTION N/A 02/15/2020   Procedure: CORONARY STENT INTERVENTION;  Surgeon: Wellington Hampshire, MD;  Location: [place] [facility];  Service: Cardiovascular;  Laterality: N/A;  CFX  . KNEE SURGERY Left 2012  . LEFT HEART CATH AND CORONARY ANGIOGRAPHY N/A 02/15/2020   Procedure: LEFT HEART CATH AND CORONARY ANGIOGRAPHY;  Surgeon: Wellington Hampshire, MD;  Location: [place] [facility];  Service: Cardiovascular;  Laterality: N/A;  . RIGHT HEART CATH N/A 02/15/2020   Procedure: RIGHT HEART CATH;  Surgeon: Wellington Hampshire, MD;  Location: [place] [facility];  Service: Cardiovascular;  Laterality: N/A;  . VENTRICULAR ASSIST DEVICE INSERTION N/A 02/15/2020   Procedure: VENTRICULAR ASSIST DEVICE INSERTION;  Surgeon: Wellington Hampshire, MD;  Location: [place] [facility];  Service: Cardiovascular;  Laterality: N/A;  iMPELLA     There were no vitals filed for this visit.   Subjective  Assessment - 03/30/20 0803    Subjective  Tired and my knees and my back hurt a little bit today.    Patient is accompanied by: Family member    Pertinent History PMH: HLD, HTN    Limitations fall risk. not currently driving. monitor blood pressure    Patient Stated Goals "build my strength" driving and back to work    Currently in Pain? No/denies                        OT Treatments/Exercises (OP) - 03/30/20 7619      ADLs   Overall ADLs education on staying home unsupervised as wife returns to work next week    Cooking pt cooking light cold and warm meal prep (microwave)    Medication Management return to driving recommendations - discussed skills needed and progress patient has made. Pt with good attention to tasks and alternating attention this day. Pt instructed on discussing further at follow up with Neurology but that in therapy has demonstrated adequate skills for graduated driving program.      Cognitive Exercises   Attention Span Alternating Constant Therapy Alternating Symbols Level 3 95% response time 50.99s with alternating attention/symbols. Would sometimes have poor recall of what symbol he was currently looking for.      Visual/Perceptual Exercises   Scanning Environmental    Scanning - Environmental 15/15 -  Environmental scanning with no difficulty this day in moderate to max distracting environment.           Reading a map - level 1 with 80% Accuracy and 29.37s response time.               OT Short Term Goals - 03/30/20 0812      OT SHORT TERM GOAL #1   Title Pt will be independent with HEP 04/03/2020    Time 3    Period Weeks    Status On-going    Target Date 04/03/20      OT SHORT TERM GOAL #2   Title Pt will complete environmental scanning in a moderate to max distracting environment with 100% accuracy    Baseline 75% accuracy    Time 3    Period Weeks    Status Achieved   100%     OT SHORT TERM GOAL #3   Title Pt will  demonstrate improved activity tolerance by completing standing exercises for 15 minutes or greater with no break    Time 3    Period Weeks    Status Achieved   went grocery shopping and walking around the neighborhood with no issues     OT SHORT TERM GOAL #4   Title Continue to assess visual deficits and set goal PRN    Time 3    Period Weeks    Status Achieved   resolved            OT Long Term Goals - 03/30/20 0819      OT LONG TERM GOAL #1   Title Pt will be independent with updated HEP 04/24/20    Time 6    Period Weeks    Status On-going      OT LONG TERM GOAL #2   Title Pt will perform physical and cognitive tasks simultaneously with 95% accuracy    Time 6    Period Weeks    Status On-going   75%  accuracy with word finding.     OT LONG TERM GOAL #3   Title Pt and caregiver will verbalize understanding for return to driving recommendations    Time 6    Period Weeks    Status Achieved      OT LONG TERM GOAL #4   Title Pt will improve activity tolerance by completing standing activities for 20 minutes or greater consecutively in order to return to playing with grandchildren.    Time 6    Period Weeks    Status Achieved   has tolerated grocery shopping and walking around neighborhood for > 30 minutes. continues to tire with tub transfers as he prefers getting in the bathtub for a bath and transfers are more challenging with knees.                Plan - 03/30/20 1610    Clinical Impression Statement Pt is demonstrating good carryover and increasing attention to tasks.    OT Occupational Profile and History Problem Focused Assessment - Including review of records relating to presenting problem    Occupational performance deficits (Please refer to evaluation for details): IADL's;ADL's;Work;Leisure    Body Structure / Function / Physical Skills ADL;UE functional use;Decreased knowledge of use of DME;Balance;FMC;Vision;Dexterity;GMC;Strength;IADL;Endurance     Cognitive Skills Understand;Problem Solve;Learn;Memory    Rehab Potential Good    Clinical Decision Making Limited treatment options, no task modification necessary    Comorbidities Affecting Occupational Performance: None    Modification  or Assistance to Complete Evaluation  No modification of tasks or assist necessary to complete eval    OT Frequency 2x / week    OT Duration 6 weeks   or 12 visits + evaluation   OT Treatment/Interventions Self-care/ADL training;Therapeutic exercise;Visual/perceptual remediation/compensation;Patient/family education;Neuromuscular education;Energy conservation;Therapist, nutritional;Therapeutic activities;DME and/or AE instruction;Cognitive remediation/compensation    Plan dual tasking, review HEPs PRN, d/c soon if goals all met?    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 Ischemic cerebrovascular accident (CVA) of frontal lobe ([place]) 02/28/2020  . Cerebral thrombosis with cerebral infarction 02/18/2020  . Subarachnoid hemorrhage 02/18/2020  . Cardiogenic shock ([place]) 02/15/2020  . Cardiac arrest ([place])   . ST elevation myocardial infarction (STEMI)  ([place])   . Encounter for imaging study to confirm orogastric (OG) tube placement   . Acute respiratory failure with hypoxia ([place])   . OSA (obstructive sleep apnea) 09/01/2016  . Hyperlipidemia 01/18/2012  . Transient global amnesia 01/17/2012    Zachery Conch MOT, OTR/L  03/30/2020, 9:16 AM  [facility] [address] [place], [place], [address] Phone: [phone]   Fax:  [phone]  Name: BRAXSON HOLLINGSWORTH MRN: [id number] Date of Birth: [birth year]

## 2020-04-02 ENCOUNTER — Encounter: Payer: Self-pay | Admitting: Physical Therapy

## 2020-04-02 ENCOUNTER — Other Ambulatory Visit: Payer: Self-pay

## 2020-04-02 ENCOUNTER — Ambulatory Visit: Payer: BC Managed Care – PPO | Admitting: Occupational Therapy

## 2020-04-02 ENCOUNTER — Ambulatory Visit: Payer: BC Managed Care – PPO | Admitting: Physical Therapy

## 2020-04-02 VITALS — BP 126/73 | HR 87

## 2020-04-02 DIAGNOSIS — R2689 Other abnormalities of gait and mobility: Secondary | ICD-10-CM | POA: Diagnosis not present

## 2020-04-02 DIAGNOSIS — I69854 Hemiplegia and hemiparesis following other cerebrovascular disease affecting left non-dominant side: Secondary | ICD-10-CM

## 2020-04-02 DIAGNOSIS — M6281 Muscle weakness (generalized): Secondary | ICD-10-CM

## 2020-04-02 DIAGNOSIS — R41840 Attention and concentration deficit: Secondary | ICD-10-CM

## 2020-04-02 DIAGNOSIS — R41844 Frontal lobe and executive function deficit: Secondary | ICD-10-CM

## 2020-04-02 DIAGNOSIS — R2681 Unsteadiness on feet: Secondary | ICD-10-CM

## 2020-04-02 NOTE — Therapy (Signed)
[facility] [address] [place], [place], [address] Phone: [phone]   Fax:  [phone]  Occupational Therapy Treatment  Patient Details  Name: Jeffrey Morse MRN: [id number] Date of Birth: [birth year] Referring Provider (OT): Alysia Penna   Encounter Date: 04/02/2020   OT End of Session - 04/02/20 1458    Visit Number 6    Number of Visits 13    Date for OT Re-Evaluation 04/24/20    Authorization Type [employer]-[place]    Authorization Time Period VL:MN    OT Start Time 1400    OT Stop Time 1450    OT Time Calculation (min) 50 min    Activity Tolerance Patient tolerated treatment well    Behavior During Therapy [facility] for tasks assessed/performed           Past Medical History:  Diagnosis Date  . HTN (hypertension)   . Hypercholesteremia   . Hypertension    Borderline hypertension  . Sleep apnea   . Stroke [facility])     Past Surgical History:  Procedure Laterality Date  . BACK SURGERY    . CARDIAC CATHETERIZATION    . CORONARY STENT INTERVENTION N/A 02/15/2020   Procedure: CORONARY STENT INTERVENTION;  Surgeon: Wellington Hampshire, MD;  Location: [place] [facility];  Service: Cardiovascular;  Laterality: N/A;  CFX  . KNEE SURGERY Left 2012  . LEFT HEART CATH AND CORONARY ANGIOGRAPHY N/A 02/15/2020   Procedure: LEFT HEART CATH AND CORONARY ANGIOGRAPHY;  Surgeon: Wellington Hampshire, MD;  Location: [place] [facility];  Service: Cardiovascular;  Laterality: N/A;  . RIGHT HEART CATH N/A 02/15/2020   Procedure: RIGHT HEART CATH;  Surgeon: Wellington Hampshire, MD;  Location: [place] [facility];  Service: Cardiovascular;  Laterality: N/A;  . VENTRICULAR ASSIST DEVICE INSERTION N/A 02/15/2020   Procedure: VENTRICULAR ASSIST DEVICE INSERTION;  Surgeon: Wellington Hampshire, MD;  Location: [place] [facility];  Service: Cardiovascular;  Laterality: N/A;  iMPELLA     There were no vitals filed for this visit.   Subjective  Assessment - 04/02/20 1409    Subjective  Busy weekend with grandbabies    Patient is accompanied by: Family member    Pertinent History PMH: HLD, HTN    Limitations fall risk. not currently driving. monitor blood pressure    Patient Stated Goals "build my strength" driving and back to work    Currently in Pain? No/denies           Reviewed previously issued HEP for scapula strengthening including: prone scapula retraction (arms abduction, elbows bent) and wall push ups. Prone on elbows w/ chest lift for scapula depression. BUE AA/ROM in high level sh flexion rolling ball up wall followed by high level sh flexion with scapula retraction.  Dual tasking b/t physical task and cognitive task (naming states) w/ mod cues/hints - pt able to name 90% w/ cues. Pt unable to perform physical task w/ mental math subtractions by 3's Discussed which MD would need to clear him for return to driving. Also discussed early retirement vs. Disability b/c pt unable to safely return to prior job functions (due to physical demands of job)                        OT Short Term Goals - 04/02/20 1459      [place] #1   Title Pt will be independent with HEP 04/03/2020    Time  3    Period Weeks    Status Achieved    Target Date 04/03/20      OT SHORT TERM GOAL #2   Title Pt will complete environmental scanning in a moderate to max distracting environment with 100% accuracy    Baseline 75% accuracy    Time 3    Period Weeks    Status Achieved   100%     OT SHORT TERM GOAL #3   Title Pt will demonstrate improved activity tolerance by completing standing exercises for 15 minutes or greater with no break    Time 3    Period Weeks    Status Achieved   went grocery shopping and walking around the neighborhood with no issues     OT SHORT TERM GOAL #4   Title Continue to assess visual deficits and set goal PRN    Time 3    Period Weeks    Status Achieved   resolved             OT Long Term Goals - 04/02/20 1459      OT LONG TERM GOAL #1   Title Pt will be independent with updated HEP 04/24/20    Time 6    Period Weeks    Status Achieved      OT LONG TERM GOAL #2   Title Pt will perform physical and cognitive tasks simultaneously with 80% accuracy    Time 6    Period Weeks    Status Revised   75%  accuracy with word finding.     OT LONG TERM GOAL #3   Title Pt and caregiver will verbalize understanding for return to driving recommendations    Time 6    Period Weeks    Status Achieved      OT LONG TERM GOAL #4   Title Pt will improve activity tolerance by completing standing activities for 20 minutes or greater consecutively in order to return to playing with grandchildren.    Time 6    Period Weeks    Status Achieved   has tolerated grocery shopping and walking around neighborhood for > 30 minutes. continues to tire with tub transfers as he prefers getting in the bathtub for a bath and transfers are more challenging with knees.                Plan - 04/02/20 1459    Clinical Impression Statement Pt has met STG and LTG #1. Revised LTG #2.    OT Occupational Profile and History Problem Focused Assessment - Including review of records relating to presenting problem    Occupational performance deficits (Please refer to evaluation for details): IADL's;ADL's;Work;Leisure    Body Structure / Function / Physical Skills ADL;UE functional use;Decreased knowledge of use of DME;Balance;FMC;Vision;Dexterity;GMC;Strength;IADL;Endurance    Cognitive Skills Understand;Problem Solve;Learn;Memory    Rehab Potential Good    Clinical Decision Making Limited treatment options, no task modification necessary    Comorbidities Affecting Occupational Performance: None    Modification or Assistance to Complete Evaluation  No modification of tasks or assist necessary to complete eval    OT Frequency 2x / week    OT Duration 6 weeks   or 12 visits + evaluation   OT  Treatment/Interventions Self-care/ADL training;Therapeutic exercise;Visual/perceptual remediation/compensation;Patient/family education;Neuromuscular education;Energy conservation;Therapist, nutritional;Therapeutic activities;DME and/or AE instruction;Cognitive remediation/compensation    Plan Pt to continue to work on LTG #2, UBE for UB endurance - consider d/c next session if appropriate. Pt reports  he may be late or have to cancel next session d/t another appointment, therefore will consider d/c at the following scheduled appointment    Consulted and Agree with Plan of Care Patient;Family member/caregiver    Family Member Consulted spouse, Glenard Haring           Patient will benefit from skilled therapeutic intervention in order to improve the following deficits and impairments:   Body Structure / Function / Physical Skills: ADL,UE functional use,Decreased knowledge of use of DME,Balance,FMC,Vision,Dexterity,GMC,Strength,IADL,Endurance Cognitive Skills: Just[employer] Memory     Visit Diagnosis: 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 Ischemic cerebrovascular accident (CVA) of frontal lobe ([place]) 02/28/2020  . Cerebral thrombosis with cerebral infarction 02/18/2020  . Subarachnoid hemorrhage 02/18/2020  . Cardiogenic shock ([place]) 02/15/2020  . Cardiac arrest ([place])   . ST elevation myocardial infarction (STEMI) ([place])   . Encounter for imaging study to confirm orogastric (OG) tube placement   . Acute respiratory failure with hypoxia ([place])   . OSA (obstructive sleep apnea) 09/01/2016  . Hyperlipidemia 01/18/2012  . Transient global amnesia 01/17/2012    Carey Bullocks, OTR/L 04/02/2020, 3:02 PM  [facility] [address] [place], [place], [address] Phone: [phone]   Fax:  [phone]  Name: ADRIANA LINA MRN: [id number] Date of Birth: 1955/08/26

## 2020-04-02 NOTE — Therapy (Signed)
[facility] [address] [place], [place], [address] Phone: [phone]   Fax:  [phone]  Physical Therapy Treatment  Patient Details  Name: Jeffrey Morse MRN: [id number] Date of Birth: November 25, 1955 Referring Provider (PT): Cathlyn Parsons, PA-C   Encounter Date: 04/02/2020   PT End of Session - 04/02/20 1510    Visit Number 6    Number of Visits 13    Date for PT Re-Evaluation 05/12/20   POC for 6 weeks, Cert for 60 days   Authorization Type [employer]    PT Start Time 1319    PT Stop Time 1359    PT Time Calculation (min) 40 min    Equipment Utilized During Treatment Gait belt    Activity Tolerance Patient tolerated treatment well    Behavior During Therapy [facility] for tasks assessed/performed           Past Medical History:  Diagnosis Date  . HTN (hypertension)   . Hypercholesteremia   . Hypertension    Borderline hypertension  . Sleep apnea   . Stroke [facility])     Past Surgical History:  Procedure Laterality Date  . BACK SURGERY    . CARDIAC CATHETERIZATION    . CORONARY STENT INTERVENTION N/A 02/15/2020   Procedure: CORONARY STENT INTERVENTION;  Surgeon: Wellington Hampshire, MD;  Location: [place] [facility];  Service: Cardiovascular;  Laterality: N/A;  CFX  . KNEE SURGERY Left 2012  . LEFT HEART CATH AND CORONARY ANGIOGRAPHY N/A 02/15/2020   Procedure: LEFT HEART CATH AND CORONARY ANGIOGRAPHY;  Surgeon: Wellington Hampshire, MD;  Location: [place] [facility];  Service: Cardiovascular;  Laterality: N/A;  . RIGHT HEART CATH N/A 02/15/2020   Procedure: RIGHT HEART CATH;  Surgeon: Wellington Hampshire, MD;  Location: [place] [facility];  Service: Cardiovascular;  Laterality: N/A;  . VENTRICULAR ASSIST DEVICE INSERTION N/A 02/15/2020   Procedure: VENTRICULAR ASSIST DEVICE INSERTION;  Surgeon: Wellington Hampshire, MD;  Location: [place] [facility];  Service: Cardiovascular;  Laterality: N/A;  iMPELLA     Vitals:    04/02/20 1329  BP: 126/73  Pulse: 87     Subjective Assessment - 04/02/20 1321    Subjective Sees the doctor on wednesday morning for the wound on his head. Went to PCP this morning and stated that everything this morning looked good. Did a lot of walking over the weekend.    Patient is accompained by: Family member   Wife Youth worker)   Pertinent History HTN, Hyperlipidemia    Limitations Walking;Standing    Diagnostic tests CT/MRI of the brain showed multiple small acute infarcts within the frontal lobes. Echocardiogram revealed severely reduced LV systolic function no evidence of pericardial effusion.  EKG after CPR showed evidence of inferior ST elevation with lateral involvement suggestive of left circumflex occlusion    Patient Stated Goals Build the strength    Currently in Pain? No/denies    Pain Onset More than a month ago    Pain Onset More than a month ago                             [facility] Adult PT Treatment/Exercise - 04/02/20 1331      Transfers   Five time sit to stand comments  13.97 seconds without UE support               Balance Exercises - 04/02/20 0001  Balance Exercises: Standing   Standing Eyes Closed Narrow base of support (BOS);Foam/compliant surface;Limitations    Standing Eyes Closed Limitations feet together 2 x 10 reps head turns, 2 x 10 reps head nods    Step Ups Forward;Intermittent UE support;6 inch;Limitations    Step Ups Limitations with 1" foam on bottom 6" step, step ups with floating contralateral leg for SLS, x10 reps B, incr difficulty performing with RLE    Tandem Gait Forward;Retro;Foam/compliant surface    Tandem Gait Limitations on blue balance beam forwards then backwards x2 reps, then adding in head turns forwards only x4 reps    Marching Foam/compliant surface;Forwards;Limitations    Marching Limitations eyes open on blue air ex x10 reps  - cues for slowed and controlled, eyes closed - 3 reps each side, 2 sets    Other Standing Exercises stepping on 6 stepping stones next to countertop down and back x5 reps - min guard for balance, adding in cognitive challenge naming animals, and then performing with reciprocal arm raise, pt with incr difficulty coordinating movement    Other Standing Exercises Comments with blue side of BOSU - alternating SLS with marching with fingertip support x6 reps B               PT Short Term Goals - 04/02/20 1331      PT SHORT TERM GOAL #1   Title Patient will be independent with initial HEP for balance/strengthening (ALL STGs Due: 04/03/2020)    Baseline pt reports independent with initial HEP    Time 3    Period Weeks    Status Achieved    Target Date 04/03/20      PT SHORT TERM GOAL #2   Title FGA to be assessed with LTG to be set as appropriate    Baseline 26/30 - LTG written as appropriate.    Time 3    Period Weeks    Status Achieved      PT SHORT TERM GOAL #3   Title Patient will improve 5x sit <> stand to </= 20 seconds without UE support to demo improved balance    Baseline 23.52 secs, 13.97 seconds on 04/02/20    Time 3    Period Weeks    Status Achieved             PT Long Term Goals - 03/19/20 1636      PT LONG TERM GOAL #1   Title Patient will be independent with Final HEP for balance/strengthening (All LTGs Due: 04/24/20)    Baseline no HEP established    Time 6    Period Weeks    Status New      PT LONG TERM GOAL #2   Title Pt will improve FGA score to at least a 29/30 in order to demo decr fall risk.    Baseline 26/30 on 03/19/20    Time 6    Period Weeks    Status Revised      PT LONG TERM GOAL #3   Title Patient will improve 5x sit <> stand to </= 15 seconds to demonstrate improved balance and reduced fall risk    Baseline 23.52 secs w/o UE support    Time 6    Period Weeks    Status New      PT LONG TERM GOAL #4   Title Patient will be able to ascend/descend 8 stairs with reciprocal pattern and no rails to demonstrate  improved ability to enter/exit home  Baseline 4 steps with recirpocal B rails    Time 6    Period Weeks    Status New      PT LONG TERM GOAL #5   Title Patient willl demo ability to ambulate >/= 1000 ft on outdoor various surfaces without AD and Mod I    Baseline 115 ft supervision indoors    Time 6    Period Weeks    Status New                 Plan - 04/02/20 1518    Clinical Impression Statement Pt met STG #3 today in regards to 5x sit <> stand, performed in 13.97 seconds today with no UE support (previously 23.52 seconds). Remainder of session focused on balance strategies on compliant surfaces with vision removed and SLS. Pt tolerated session well. Continues to be challenged by dual tasking.    Personal Factors and Comorbidities Comorbidity 1    Comorbidities HTN, Hyperlipidemia    Examination-Activity Limitations Stairs;Stand;Squat;Locomotion Level    Examination-Participation Restrictions Occupation;Yard Work;Community Activity;Driving    Stability/Clinical Decision Making Stable/Uncomplicated    Rehab Potential Good    PT Frequency 2x / week    PT Duration 6 weeks    PT Treatment/Interventions ADLs/Self Care Home Management;Cryotherapy;Moist Heat;DME Instruction;Gait training;Stair training;Functional mobility training;Therapeutic activities;Therapeutic exercise;Balance training;Neuromuscular re-education;Patient/family education;Orthotic Fit/Training;Manual techniques;Passive range of motion;Vestibular    PT Next Visit Plan add to HEP as appropriate. Continue high level balance, working on SLS, eyes closed,  Dual Tasking activities. Functional BLE strengthening.    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 Ischemic cerebrovascular accident (CVA) of frontal lobe ([place]) 02/28/2020  . Cerebral thrombosis with cerebral infarction 02/18/2020  . Subarachnoid hemorrhage 02/18/2020  . Cardiogenic shock ([place]) 02/15/2020  . Cardiac arrest ([place])   . ST elevation myocardial infarction (STEMI) ([place])   . Encounter for imaging study to confirm orogastric (OG) tube placement   . Acute respiratory failure with hypoxia ([place])   . OSA (obstructive sleep apnea) 09/01/2016  . Hyperlipidemia 01/18/2012  . Transient global amnesia 01/17/2012    Lillia Pauls, DPT  04/02/2020, 3:19 PM  [facility] [address] [place], [place], [address] Phone: [phone]   Fax:  [phone]  Name: Jeffrey Morse MRN: [id number] Date of Birth: [birth year]

## 2020-04-04 ENCOUNTER — Other Ambulatory Visit: Payer: Self-pay

## 2020-04-04 ENCOUNTER — Ambulatory Visit: Payer: BC Managed Care – PPO | Admitting: Occupational Therapy

## 2020-04-04 ENCOUNTER — Other Ambulatory Visit (HOSPITAL_BASED_OUTPATIENT_CLINIC_OR_DEPARTMENT_OTHER): Payer: Self-pay | Admitting: Internal Medicine

## 2020-04-04 ENCOUNTER — Ambulatory Visit: Payer: BC Managed Care – PPO | Admitting: Physical Therapy

## 2020-04-04 ENCOUNTER — Encounter: Payer: BC Managed Care – PPO | Attending: Internal Medicine | Admitting: Internal Medicine

## 2020-04-04 DIAGNOSIS — E7800 Pure hypercholesterolemia, unspecified: Secondary | ICD-10-CM | POA: Insufficient documentation

## 2020-04-04 DIAGNOSIS — L98498 Non-pressure chronic ulcer of skin of other sites with other specified severity: Secondary | ICD-10-CM | POA: Diagnosis not present

## 2020-04-04 DIAGNOSIS — E1151 Type 2 diabetes mellitus with diabetic peripheral angiopathy without gangrene: Secondary | ICD-10-CM | POA: Diagnosis not present

## 2020-04-04 DIAGNOSIS — Z7902 Long term (current) use of antithrombotics/antiplatelets: Secondary | ICD-10-CM | POA: Insufficient documentation

## 2020-04-04 DIAGNOSIS — S0001XD Abrasion of scalp, subsequent encounter: Secondary | ICD-10-CM | POA: Diagnosis not present

## 2020-04-04 DIAGNOSIS — I110 Hypertensive heart disease with heart failure: Secondary | ICD-10-CM | POA: Insufficient documentation

## 2020-04-04 DIAGNOSIS — I252 Old myocardial infarction: Secondary | ICD-10-CM | POA: Insufficient documentation

## 2020-04-04 DIAGNOSIS — Z8674 Personal history of sudden cardiac arrest: Secondary | ICD-10-CM | POA: Insufficient documentation

## 2020-04-04 DIAGNOSIS — I509 Heart failure, unspecified: Secondary | ICD-10-CM | POA: Diagnosis not present

## 2020-04-04 DIAGNOSIS — E11622 Type 2 diabetes mellitus with other skin ulcer: Secondary | ICD-10-CM | POA: Diagnosis not present

## 2020-04-04 DIAGNOSIS — X58XXXD Exposure to other specified factors, subsequent encounter: Secondary | ICD-10-CM | POA: Insufficient documentation

## 2020-04-04 DIAGNOSIS — I2510 Atherosclerotic heart disease of native coronary artery without angina pectoris: Secondary | ICD-10-CM | POA: Insufficient documentation

## 2020-04-04 DIAGNOSIS — L659 Nonscarring hair loss, unspecified: Secondary | ICD-10-CM | POA: Insufficient documentation

## 2020-04-04 LAB — GLUCOSE, CAPILLARY: Glucose-Capillary: 114 mg/dL — ABNORMAL HIGH (ref 70–99)

## 2020-04-05 LAB — SURGICAL PATHOLOGY

## 2020-04-05 NOTE — Progress Notes (Signed)
Jeffrey Morse, Jeffrey Morse ([id number]) Visit Report for 04/04/2020 Biopsy Details Patient Name: Jeffrey Morse, Jeffrey Morse Date of Service: 04/04/2020 8:30 AM Medical Record Number: [id number] Patient Account Number: [id number] Date of Birth/Sex: 1955-10-20 (64 y.o. M) Treating RN: Huel Coventry Primary Care Provider: Blair Heys Other Clinician: Lolita Cram Referring Provider: Blair Heys Treating Provider/Extender: Altamese [place] in Treatment: 0 Biopsy Performed for: Wound #1 Head - occiput Location(s): Wound Margin Performed By: Physician Maxwell Caul, MD Tissue Punch: Yes Size (mm): 5 Number of Specimens Taken: 1 Specimen Sent To Pathology: Yes Level of Consciousness (Pre-procedure): Awake and Alert Pre-procedure Verification/Time-Out Taken: Yes - 09:29 Pain Control: Lidocaine 4% Topical Solution Instrument: Scissors Bleeding: Large Hemostasis Achieved: Silver Nitrate Procedural Pain: 0 Response to Treatment: Procedure was tolerated well Level of Consciousness (Post-procedure): Awake and Alert Post Procedure Diagnosis Same as Pre-procedure Electronic Signature(s) Signed: 04/04/2020 5:07:57 PM By: Baltazar Najjar MD Entered By: Baltazar Najjar on 04/04/2020 10:01:54 Jeffrey Morse ([id number]) -------------------------------------------------------------------------------- Chief Complaint Document Details Patient Name: Jeffrey Morse Date of Service: 04/04/2020 8:30 AM Medical Record Number: [id number] Patient Account Number: [id number] Date of Birth/Sex: [birth year] (64 y.o. M) Treating RN: Huel Coventry Primary Care Provider: Blair Heys Other Clinician: Lolita Cram Referring Provider: Blair Heys Treating Provider/Extender: Altamese [place] in Treatment: 0 Information Obtained from: Patient Chief Complaint 04/04/2020; patient arrives for review of wound on his occiput of his scalp Electronic Signature(s) Signed: 04/04/2020 5:07:57 PM By: Baltazar Najjar MD Entered By: Baltazar Najjar on 04/04/2020 10:02:22 Jeffrey Morse ([id number]) -------------------------------------------------------------------------------- Debridement Details Patient Name: Jeffrey Morse Date of Service: 04/04/2020 8:30 AM Medical Record Number: [id number] Patient Account Number: [id number] Date of Birth/Sex: [birth year] (64 y.o. M) Treating RN: Huel Coventry Primary Care Provider: Blair Heys Other Clinician: Lolita Cram Referring Provider: Blair Heys Treating Provider/Extender: Altamese [place] in Treatment: 0 Debridement Performed for Wound #1 Head - occiput Assessment: Performed By: Physician Maxwell Caul, MD Debridement Type: Chemical/Enzymatic/Mechanical Agent Used: saline and gauze Level of Consciousness (Pre- Awake and Alert procedure): Pre-procedure Verification/Time Out Yes - 09:45 Taken: Instrument: Other : saline and gauze Bleeding: Moderate Hemostasis Achieved: Pressure Response to Treatment: Procedure was tolerated well Level of Consciousness (Post- Awake and Alert procedure): Post Debridement Measurements of Total Wound Length: (cm) 3.5 Width: (cm) 3.5 Depth: (cm) 0.1 Volume: (cm) 0.962 Character of Wound/Ulcer Post Debridement: Stable Post Procedure Diagnosis Same as Pre-procedure Electronic Signature(s) Signed: 04/04/2020 1:08:44 PM By: Elliot Gurney, BSN, RN, CWS, Kim RN, BSN Signed: 04/04/2020 5:07:57 PM By: Baltazar Najjar MD Entered By: Elliot Gurney, BSN, RN, CWS, Kim on 04/04/2020 13:08:44 JABRE, HEO ([id number]) -------------------------------------------------------------------------------- HPI Details Patient Name: Jeffrey Morse Date of Service: 04/04/2020 8:30 AM Medical Record Number: [id number] Patient Account Number: [id number] Date of Birth/Sex: 27-Jan-1956 (64 y.o. M) Treating RN: Huel Coventry Primary Care Provider: Blair Heys Other Clinician: Lolita Cram Referring Provider:  Blair Heys Treating Provider/Extender: Altamese [place] in Treatment: 0 History of Present Illness HPI Description: ADMISSION 04/04/2020 This is a 65 year old man who presented in January to an urgent care with acute coronary syndrome. He had a cardiac arrest requiring cardioversion and then had further arrest requiring cardioversion on the way to and in the hospital and apparently in the Cath Lab. He had an acute inferior lateral MI. I think somewhere in this he had a right thalamic frontal bleed as well and spent some time in rehab. During the stay in rehab it was discovered that he had a black  eschar on his occiput. The nurse told him that she thought it might be a pressure wound. The patient's wife has since remove the eschar since he arrived home. He has a large hyper granulated area over the occiput. He also has an extensive patch of alopecia around this area. No other scalp skin lesions or areas are seen. He arrives with several questions about whether recurrent cardioversion like he suffered could cause a burn injury or perhaps the CT scan. He has been applying iodine and Neosporin Past medical history includes type 2 diabetes, coronary artery disease, lumbar spinal stenosis, hypertension, hypercholesterolemia. He is on Plavix. Electronic Signature(s) Signed: 04/04/2020 5:07:57 PM By: Baltazar Najjar MD Entered By: Baltazar Najjar on 04/04/2020 10:05:50 Jeffrey Morse ([id number]) -------------------------------------------------------------------------------- Physical Exam Details Patient Name: Jeffrey Morse Date of Service: 04/04/2020 8:30 AM Medical Record Number: [id number] Patient Account Number: [id number] Date of Birth/Sex: 02/17/1955 (64 y.o. M) Treating RN: Huel Coventry Primary Care Provider: Blair Heys Other Clinician: Lolita Cram Referring Provider: Blair Heys Treating Provider/Extender: Altamese [place] in Treatment:  0 Constitutional Sitting or standing Blood Pressure is within target range for patient.. Pulse regular and within target range for patient.Marland Kitchen Respirations regular, non- labored and within target range.. Temperature is normal and within the target range for the patient.Marland Kitchen appears in no distress. Integumentary (Hair, Skin) No scalp skin lesion is seen. Notes Wound exam. The area in question is on the tip of the occiput of his scalp. Fairly large hyper granulated area with irregular areas of hyper granulation. There is alopecia by several diameters around this but no active skin lesion is seen other than the wound. 2 areas of this wound in the center and around the rim inferiorly were anesthetized with lidocaine and punch biopsies were obtained. Electronic Signature(s) Signed: 04/04/2020 5:07:57 PM By: Baltazar Najjar MD Entered By: Baltazar Najjar on 04/04/2020 10:09:04 Jeffrey Morse ([id number]) -------------------------------------------------------------------------------- Physician Orders Details Patient Name: Jeffrey Morse Date of Service: 04/04/2020 8:30 AM Medical Record Number: [id number] Patient Account Number: [id number] Date of Birth/Sex: [birth year] (64 y.o. M) Treating RN: Huel Coventry Primary Care Provider: Blair Heys Other Clinician: Lolita Cram Referring Provider: Blair Heys Treating Provider/Extender: Altamese [place] in Treatment: 0 Verbal / Phone Orders: No Diagnosis Coding Follow-up Appointments o Return Appointment in 1 week. Bathing/ Shower/ Hygiene o May shower; gently cleanse wound with antibacterial soap, rinse and pat dry prior to dressing wounds Anesthetic (Use 'Patient Medications' Section for Anesthetic Order Entry) o Injectable lidocaine applied before procedure. Blood Glucose Testing Wound #1 Head - occiput o Finger stick Blood Glucose Wound Treatment Wound #1 - Head - occiput Primary Dressing: Silvercel Small 2x2 (in/in)  (DME) (Generic) 1 x Per Day/30 Days Discharge Instructions: Apply Silvercel Small 2x2 (in/in) as instructed Secondary Dressing: Gauze (DME) (Generic) 1 x Per Day/30 Days Discharge Instructions: As directed: dry, moistened with saline or moistened with Dakins Solution Secured With: Tegaderm Film 4x4 (in/in) (DME) 1 x Per Day/30 Days Discharge Instructions: Apply to wound bed Electronic Signature(s) Signed: 04/04/2020 2:38:08 PM By: Elliot Gurney, BSN, RN, CWS, Kim RN, BSN Signed: 04/04/2020 5:07:57 PM By: Baltazar Najjar MD Previous Signature: 04/04/2020 2:37:12 PM Version By: Elliot Gurney, BSN, RN, CWS, Kim RN, BSN Entered By: Elliot Gurney, BSN, RN, CWS, Kim on 04/04/2020 14:38:08 Jeffrey Morse ([id number]) -------------------------------------------------------------------------------- Problem List Details Patient Name: Jeffrey Morse Date of Service: 04/04/2020 8:30 AM Medical Record Number: [id number] Patient Account Number: [id number] Date of Birth/Sex: July 04, 1955 (64 y.o. M) Treating RN:  Huel Coventry Primary Care Provider: Blair Heys Other Clinician: Lolita Cram Referring Provider: Blair Heys Treating Provider/Extender: Altamese [place] in Treatment: 0 Active Problems ICD-10 Encounter Code Description Active Date MDM Diagnosis L98.498 Non-pressure chronic ulcer of skin of other sites with other specified 04/04/2020 No Yes severity S00.01XD Abrasion of scalp, subsequent encounter 04/04/2020 No Yes Inactive Problems Resolved Problems Electronic Signature(s) Signed: 04/04/2020 5:07:57 PM By: Baltazar Najjar MD Entered By: Baltazar Najjar on 04/04/2020 09:48:51 Jeffrey Morse ([id number]) -------------------------------------------------------------------------------- Progress Note Details Patient Name: Jeffrey Morse Date of Service: 04/04/2020 8:30 AM Medical Record Number: [id number] Patient Account Number: [id number] Date of Birth/Sex: [birth year] (64 y.o. M) Treating RN:  Huel Coventry Primary Care Provider: Blair Heys Other Clinician: Lolita Cram Referring Provider: Blair Heys Treating Provider/Extender: Altamese [place] in Treatment: 0 Subjective Chief Complaint Information obtained from Patient 04/04/2020; patient arrives for review of wound on his occiput of his scalp History of Present Illness (HPI) ADMISSION 04/04/2020 This is a 65 year old man who presented in January to an urgent care with acute coronary syndrome. He had a cardiac arrest requiring cardioversion and then had further arrest requiring cardioversion on the way to and in the hospital and apparently in the Cath Lab. He had an acute inferior lateral MI. I think somewhere in this he had a right thalamic frontal bleed as well and spent some time in rehab. During the stay in rehab it was discovered that he had a black eschar on his occiput. The nurse told him that she thought it might be a pressure wound. The patient's wife has since remove the eschar since he arrived home. He has a large hyper granulated area over the occiput. He also has an extensive patch of alopecia around this area. No other scalp skin lesions or areas are seen. He arrives with several questions about whether recurrent cardioversion like he suffered could cause a burn injury or perhaps the CT scan. He has been applying iodine and Neosporin Past medical history includes type 2 diabetes, coronary artery disease, lumbar spinal stenosis, hypertension, hypercholesterolemia. He is on Plavix. Patient History Information obtained from Patient. Allergies No Known Allergies Social History Former smoker, Alcohol Use - Never, Drug Use - No History, Caffeine Use - Daily. Medical History Eyes Denies history of Cataracts, Glaucoma, Optic Neuritis Ear/Nose/Mouth/Throat Denies history of Chronic sinus problems/congestion, Middle ear problems Hematologic/Lymphatic Denies history of Anemia, Hemophilia, Human  Immunodeficiency Virus, Lymphedema, Sickle Cell Disease Respiratory Denies history of Aspiration, Asthma, Chronic Obstructive Pulmonary Disease (COPD), Pneumothorax, Sleep Apnea, Tuberculosis Cardiovascular Patient has history of Congestive Heart Failure - diagnosed january 11th, 2022, Hypertension - 2 months, Myocardial Infarction Gastrointestinal Denies history of Cirrhosis , Colitis, Crohn s, Hepatitis A, Hepatitis B, Hepatitis C Endocrine Patient has history of Type II Diabetes Denies history of Type I Diabetes Genitourinary Denies history of End Stage Renal Disease Immunological Denies history of Lupus Erythematosus, Raynaud s, Scleroderma Integumentary (Skin) Denies history of History of Burn, History of pressure wounds Musculoskeletal Denies history of Gout, Rheumatoid Arthritis, Osteoarthritis, Osteomyelitis Neurologic Denies history of Dementia, Neuropathy, Quadriplegia, Paraplegia, Seizure Disorder Oncologic Denies history of Received Chemotherapy, Received Radiation Psychiatric Denies history of Anorexia/bulimia, Confinement Anxiety Patient is treated with Oral Agents. Blood sugar is tested. Review of Systems (ROS) Constitutional Symptoms (General Health) Jeffrey Morse, Jeffrey Morse ([id number]) Denies complaints or symptoms of Fatigue, Fever, Chills, Marked Weight Change. Eyes Denies complaints or symptoms of Dry Eyes, Vision Changes, Glasses / Contacts. Ear/Nose/Mouth/Throat Denies complaints or symptoms of Difficult  clearing ears, Sinusitis. Hematologic/Lymphatic Denies complaints or symptoms of Bleeding / Clotting Disorders, Human Immunodeficiency Virus. Respiratory Denies complaints or symptoms of Chronic or frequent coughs, Shortness of Breath. Cardiovascular Denies complaints or symptoms of Chest pain, LE edema. Gastrointestinal Denies complaints or symptoms of Frequent diarrhea, Nausea, Vomiting. Endocrine Denies complaints or symptoms of Hepatitis, Thyroid disease,  Polydypsia (Excessive Thirst). Genitourinary Denies complaints or symptoms of Kidney failure/ Dialysis, Incontinence/dribbling. Immunological Denies complaints or symptoms of Hives, Itching. Integumentary (Skin) Complains or has symptoms of Wounds. Denies complaints or symptoms of Bleeding or bruising tendency, Breakdown, Swelling. Musculoskeletal Complains or has symptoms of Muscle Weakness. Denies complaints or symptoms of Muscle Pain. Neurologic Denies complaints or symptoms of Numbness/parasthesias, Focal/Weakness. Psychiatric Denies complaints or symptoms of Anxiety, Claustrophobia. Objective Constitutional Sitting or standing Blood Pressure is within target range for patient.. Pulse regular and within target range for patient.Marland Kitchen Respirations regular, non- labored and within target range.. Temperature is normal and within the target range for the patient.Marland Kitchen appears in no distress. Vitals Time Taken: 8:43 AM, Height: 71 in, Weight: 198 lbs, Source: Measured, BMI: 27.6, Temperature: 98.0 F, Pulse: 67 bpm, Respiratory Rate: 18 breaths/min, Blood Pressure: 126/86 mmHg, Capillary Blood Glucose: 114 mg/dl. General Notes: Wound exam. The area in question is on the tip of the occiput of his scalp. Fairly large hyper granulated area with irregular areas of hyper granulation. There is alopecia by several diameters around this but no active skin lesion is seen other than the wound. 2 areas of this wound in the center and around the rim inferiorly were anesthetized with lidocaine and punch biopsies were obtained. Integumentary (Hair, Skin) No scalp skin lesion is seen. Wound #1 status is Open. Original cause of wound was Pressure Injury. The date acquired was: 03/06/2020. The wound is located on the Head - occiput. The wound measures 3.5cm length x 3.5cm width x 0.1cm depth; 9.621cm^2 area and 0.962cm^3 volume. There is Fat Layer (Subcutaneous Tissue) exposed. There is no tunneling or undermining  noted. There is a small amount of serosanguineous drainage noted. The wound margin is flat and intact. There is large (67-100%) red granulation within the wound bed. There is a small (1-33%) amount of necrotic tissue within the wound bed including Adherent Slough. Assessment Active Problems ICD-10 Non-pressure chronic ulcer of skin of other sites with other specified severity Abrasion of scalp, subsequent encounter Jeffrey Morse, Jeffrey Morse ([id number]) Procedures Wound #1 Pre-procedure diagnosis of Wound #1 is a To be determined located on the Head - occiput . There was a biopsy performed by Maxwell Caul, MD. There was a biopsy performed on Wound Margin. The skin was cleansed and prepped with anti-septic followed by pain control using Lidocaine 4% Topical Solution. Utilizing a 5 mm tissue punch, tissue was removed at its base with the following instrument(s): Scissors and sent to pathology. A Large amount of bleeding was controlled with Silver Nitrate. A time out was conducted at 09:29, prior to the start of the procedure. The procedure was tolerated well with a pain level of 0 throughout. Post procedure Diagnosis Wound #1: Same as Pre-Procedure Plan Follow-up Appointments: Return Appointment in 1 week. Bathing/ Shower/ Hygiene: May shower; gently cleanse wound with antibacterial soap, rinse and pat dry prior to dressing wounds Anesthetic (Use 'Patient Medications' Section for Anesthetic Order Entry): Injectable lidocaine applied before procedure. Blood Glucose Testing: Wound #1 Head - occiput: Finger stick Blood Glucose WOUND #1: - Head - occiput Wound Laterality: Primary Dressing: Silvercel Small 2x2 (in/in) 1 x  Per Day/30 Days Discharge Instructions: Apply Silvercel Small 2x2 (in/in) as instructed Secondary Dressing: Gauze 1 x Per Day/30 Days Discharge Instructions: As directed: dry, moistened with saline or moistened with Dakins Solution Secured With: Tegaderm Film 4x4 (in/in) 1 x  Per Day/30 Days Discharge Instructions: Apply to wound bed 1. Unusual wound on the occiput of his scalp. Not a usual site for a pressure ulcer. They asked me questions about burn injuries related to cardioversion but I have never seen anything outside the chest wall certainly not in this area. 2. It is also difficult to explain the degree of alopecia 3. We applied silver alginate to the wound with a covering adhesive dressing. If the biopsy is negative this will require reasonably extensive debridement of hyper granulated tissue and probably change to [facility] 4. I did a quick search of epic I did not see any description of this wound. His significant other was able to show me a picture of this on her cell phone there was indeed a black eschar at 1 point. I spent 35 minutes in review of this patient's past medical history, face-to-face evaluation and preparation of this record Electronic Signature(s) Signed: 04/04/2020 5:07:57 PM By: Baltazar Najjar MD Entered By: Baltazar Najjar on 04/04/2020 10:12:22 Jeffrey Morse ([id number]) -------------------------------------------------------------------------------- ROS/PFSH Details Patient Name: Jeffrey Morse Date of Service: 04/04/2020 8:30 AM Medical Record Number: [id number] Patient Account Number: [id number] Date of Birth/Sex: Jun 28, 1955 (64 y.o. M) Treating RN: Huel Coventry Primary Care Provider: Blair Heys Other Clinician: Lolita Cram Referring Provider: Blair Heys Treating Provider/Extender: Altamese [place] in Treatment: 0 Information Obtained From Patient Constitutional Symptoms (General Health) Complaints and Symptoms: Negative for: Fatigue; Fever; Chills; Marked Weight Change Eyes Complaints and Symptoms: Negative for: Dry Eyes; Vision Changes; Glasses / Contacts Medical History: Negative for: Cataracts; Glaucoma; Optic Neuritis Ear/Nose/Mouth/Throat Complaints and Symptoms: Negative for:  Difficult clearing ears; Sinusitis Medical History: Negative for: Chronic sinus problems/congestion; Middle ear problems Hematologic/Lymphatic Complaints and Symptoms: Negative for: Bleeding / Clotting Disorders; Human Immunodeficiency Virus Medical History: Negative for: Anemia; Hemophilia; Human Immunodeficiency Virus; Lymphedema; Sickle Cell Disease Respiratory Complaints and Symptoms: Negative for: Chronic or frequent coughs; Shortness of Breath Medical History: Negative for: Aspiration; Asthma; Chronic Obstructive Pulmonary Disease (COPD); Pneumothorax; Sleep Apnea; Tuberculosis Cardiovascular Complaints and Symptoms: Negative for: Chest pain; LE edema Medical History: Positive for: Congestive Heart Failure - diagnosed january 11th, 2022; Hypertension - 2 months; Myocardial Infarction Gastrointestinal Complaints and Symptoms: Negative for: Frequent diarrhea; Nausea; Vomiting Medical History: Negative for: Cirrhosis ; Colitis; Crohnos; Hepatitis A; Hepatitis B; Hepatitis C Endocrine Complaints and Symptoms: Negative for: Hepatitis; Thyroid disease; Polydypsia (Excessive Thirst) Jeffrey Morse, Jeffrey Morse ([id number]) Medical History: Positive for: Type II Diabetes Negative for: Type I Diabetes Time with diabetes: 2 months Treated with: Oral agents Blood sugar tested every day: Yes Tested : twice a day Genitourinary Complaints and Symptoms: Negative for: Kidney failure/ Dialysis; Incontinence/dribbling Medical History: Negative for: End Stage Renal Disease Immunological Complaints and Symptoms: Negative for: Hives; Itching Medical History: Negative for: Lupus Erythematosus; Raynaudos; Scleroderma Integumentary (Skin) Complaints and Symptoms: Positive for: Wounds Negative for: Bleeding or bruising tendency; Breakdown; Swelling Medical History: Negative for: History of Burn; History of pressure wounds Musculoskeletal Complaints and Symptoms: Positive for: Muscle  Weakness Negative for: Muscle Pain Medical History: Negative for: Gout; Rheumatoid Arthritis; Osteoarthritis; Osteomyelitis Neurologic Complaints and Symptoms: Negative for: Numbness/parasthesias; Focal/Weakness Medical History: Negative for: Dementia; Neuropathy; Quadriplegia; Paraplegia; Seizure Disorder Psychiatric Complaints and Symptoms: Negative for: Anxiety; Claustrophobia Medical  History: Negative for: Anorexia/bulimia; Confinement Anxiety Oncologic Medical History: Negative for: Received Chemotherapy; Received Radiation Immunizations Pneumococcal Vaccine: Received Pneumococcal Vaccination: No Implantable Devices None Jeffrey Morse, Jeffrey H. ([id number]) Family and Social History Former smoker; Alcohol Use: Never; Drug Use: No History; Caffeine Use: Daily Electronic Signature(s) Signed: 04/04/2020 5:07:57 PM By: Baltazar Najjarobson, Aryonna Gunnerson MD Signed: 04/04/2020 5:56:34 PM By: Elliot GurneyWoody, BSN, RN, CWS, Kim RN, BSN Signed: 04/05/2020 1:24:27 PM By: Lolita CramBurnette, Kyara Entered By: Lolita CramBurnette, Kyara on 04/04/2020 08:51:19 Jeffrey Morse, Jeffrey H. ([id number]) -------------------------------------------------------------------------------- SuperBill Details Patient Name: Jeffrey Morse, Jeffrey H. Date of Service: 04/04/2020 Medical Record Number: [id number] Patient Account Number: [id number] Date of Birth/Sex: 09-14-1955 (64 y.o. M) Treating RN: Huel CoventryWoody, Kim Primary Care Provider: Blair HeysEhinger, Robert Other Clinician: Lolita CramBurnette, Kyara Referring Provider: Blair HeysEhinger, Robert Treating Provider/Extender: Altamese CarolinaOBSON, Samreet [place] G Weeks in Treatment: 0 Diagnosis Coding ICD-10 Codes Code Description [id number] Non-pressure chronic ulcer of skin of other sites with other specified severity S00.01XD Abrasion of scalp, subsequent encounter Facility Procedures CPT4 Code: [id number] Description: 11104-Punch biopsy of skin (including simple closure, when performed) single lesion Modifier: Quantity: 2 CPT4 Code: Description: ICD-10 Diagnosis  Description L98.498 Non-pressure chronic ulcer of skin of other sites with other specified severity S00.01XD Abrasion of scalp, subsequent encounter Modifier: Quantity: Physician Procedures CPT4 Code: [id number] Description: WC PHYS LEVEL 3 o NEW PT Modifier: 25 Quantity: 1 CPT4 Code: Description: ICD-10 Diagnosis Description L98.498 Non-pressure chronic ulcer of skin of other sites with other specified severi S00.01XD Abrasion of scalp, subsequent encounter Modifier: ty Quantity: CPT4 Code: [id number] Description: Punch biopsy of skin (including simple closure, when performed) single lesion Modifier: Quantity: 2 CPT4 Code: Description: ICD-10 Diagnosis Description L98.498 Non-pressure chronic ulcer of skin of other sites with other specified severi S00.01XD Abrasion of scalp, subsequent encounter Modifier: ty Quantity: Electronic Signature(s) Signed: 04/04/2020 5:07:57 PM By: Baltazar Najjarobson, Angelyn Osterberg MD Entered By: Baltazar Najjarobson, Conya Ellinwood on 04/04/2020 10:13:17

## 2020-04-05 NOTE — Progress Notes (Signed)
KOLEMAN, MARLING ([id number]) Visit Report for 04/04/2020 Abuse/Suicide Risk Screen Details Patient Name: Jeffrey Morse, Jeffrey Morse Date of Service: 04/04/2020 8:30 AM Medical Record Number: [id number] Patient Account Number: [id number] Date of Birth/Sex: 1955/11/05 (64 y.o. M) Treating RN: Huel Coventry Primary Care Deby Adger: Blair Heys Other Clinician: Lolita Cram Referring Arihanna Estabrook: Blair Heys Treating Katrice Goel/Extender: Altamese [place] in Treatment: 0 Abuse/Suicide Risk Screen Items Answer ABUSE RISK SCREEN: Has anyone close to you tried to hurt or harm you recentlyo No Do you feel uncomfortable with anyone in your familyo No Has anyone forced you do things that you didnot want to doo No Electronic Signature(s) Signed: 04/04/2020 5:56:34 PM By: Elliot Gurney, BSN, RN, CWS, Kim RN, BSN Signed: 04/05/2020 1:24:27 PM By: Lolita Cram Entered By: Lolita Cram on 04/04/2020 08:51:32 Jeffrey Morse ([id number]) -------------------------------------------------------------------------------- Activities of Daily Living Details Patient Name: Jeffrey Morse Date of Service: 04/04/2020 8:30 AM Medical Record Number: [id number] Patient Account Number: [id number] Date of Birth/Sex: January 28, 1956 (64 y.o. M) Treating RN: Huel Coventry Primary Care Jarrett Albor: Blair Heys Other Clinician: Lolita Cram Referring Valentine Kuechle: Blair Heys Treating Stephfon Bovey/Extender: Altamese [place] in Treatment: 0 Activities of Daily Living Items Answer Activities of Daily Living (Please select one for each item) Drive Automobile Not Able Take Medications Completely Able Use Telephone Completely Able Care for Appearance Completely Able Use Toilet Completely Able Bath / Shower Need Assistance Dress Self Completely Able Feed Self Completely Able Walk Completely Able Get In / Out Bed Completely Able Housework Completely Able Prepare Meals Need Assistance Handle Money Completely  Able Shop for Self Need Assistance Electronic Signature(s) Signed: 04/04/2020 5:56:34 PM By: Elliot Gurney, BSN, RN, CWS, Kim RN, BSN Signed: 04/05/2020 1:24:27 PM By: Lolita Cram Entered By: Lolita Cram on 04/04/2020 08:53:03 Jeffrey Morse ([id number]) -------------------------------------------------------------------------------- Education Screening Details Patient Name: Jeffrey Morse Date of Service: 04/04/2020 8:30 AM Medical Record Number: [id number] Patient Account Number: [id number] Date of Birth/Sex: [birth year] (64 y.o. M) Treating RN: Huel Coventry Primary Care Ambar Raphael: Blair Heys Other Clinician: Lolita Cram Referring Rei Contee: Blair Heys Treating Kemyra August/Extender: Altamese [place] in Treatment: 0 Primary Learner Assessed: Patient Learning Preferences/Education Level/Primary Language Learning Preference: Explanation, Demonstration Highest Education Level: High School Preferred Language: English Cognitive Barrier Language Barrier: No Translator Needed: No Memory Deficit: No Emotional Barrier: No Cultural/Religious Beliefs Affecting Medical Care: No Physical Barrier Impaired Vision: No Impaired Hearing: No Decreased Hand dexterity: No Knowledge/Comprehension Knowledge Level: High Comprehension Level: High Ability to understand written instructions: High Ability to understand verbal instructions: High Motivation Anxiety Level: Calm Cooperation: Cooperative Education Importance: Acknowledges Need Interest in Health Problems: Asks Questions Perception: Coherent Willingness to Engage in Self-Management High Activities: Readiness to Engage in Self-Management High Activities: Electronic Signature(s) Signed: 04/04/2020 5:56:34 PM By: Elliot Gurney, BSN, RN, CWS, Kim RN, BSN Signed: 04/05/2020 1:24:27 PM By: Lolita Cram Entered By: Lolita Cram on 04/04/2020 08:53:47 KAHNE, HELFAND  ([id number]) -------------------------------------------------------------------------------- Fall Risk Assessment Details Patient Name: Jeffrey Morse Date of Service: 04/04/2020 8:30 AM Medical Record Number: [id number] Patient Account Number: [id number] Date of Birth/Sex: [birth year] (64 y.o. M) Treating RN: Huel Coventry Primary Care Ennifer Harston: Blair Heys Other Clinician: Lolita Cram Referring Quinette Hentges: Blair Heys Treating Naylee Frankowski/Extender: Altamese [place] in Treatment: 0 Fall Risk Assessment Items Have you had 2 or more falls in the last 12 monthso 0 No Have you had any fall that resulted in injury in the last 12 monthso 0 Yes FALLS RISK SCREEN History of falling - immediate or within  3 months 25 Yes Secondary diagnosis (Do you have 2 or more medical diagnoseso) 15 Yes Ambulatory aid None/bed rest/wheelchair/nurse 0 No Crutches/cane/walker 0 No Furniture 0 No Intravenous therapy Access/Saline/Heparin Lock 0 No Gait/Transferring Normal/ bed rest/ wheelchair 0 No Weak (short steps with or without shuffle, stooped but able to lift head while walking, may 0 No seek support from furniture) Impaired (short steps with shuffle, may have difficulty arising from chair, head down, impaired 0 No balance) Mental Status Oriented to own ability 0 No Electronic Signature(s) Signed: 04/04/2020 5:56:34 PM By: Elliot Gurney, BSN, RN, CWS, Kim RN, BSN Signed: 04/05/2020 1:24:27 PM By: Lolita Cram Entered By: Lolita Cram on 04/04/2020 08:54:37 Jeffrey Morse ([id number]) -------------------------------------------------------------------------------- Foot Assessment Details Patient Name: Jeffrey Morse Date of Service: 04/04/2020 8:30 AM Medical Record Number: [id number] Patient Account Number: [id number] Date of Birth/Sex: 05-09-1955 (64 y.o. M) Treating RN: Huel Coventry Primary Care Jeiry Birnbaum: Blair Heys Other Clinician: Lolita Cram Referring Ranada Vigorito: Blair Heys Treating Jonas Goh/Extender: Altamese [place] in Treatment: 0 Foot Assessment Items Site Locations + = Sensation present, - = Sensation absent, C = Callus, U = Ulcer R = Redness, W = Warmth, M = Maceration, PU = Pre-ulcerative lesion F = Fissure, S = Swelling, D = Dryness Assessment Right: Left: Other Deformity: No No Prior Foot Ulcer: No No Prior Amputation: No No Charcot Joint: No No Ambulatory Status: Ambulatory Without Help Gait: Electronic Signature(s) Signed: 04/04/2020 5:56:34 PM By: Elliot Gurney, BSN, RN, CWS, Kim RN, BSN Signed: 04/05/2020 1:24:27 PM By: Lolita Cram Entered By: Lolita Cram on 04/04/2020 08:55:45 Jeffrey Morse ([id number]) -------------------------------------------------------------------------------- Nutrition Risk Screening Details Patient Name: Jeffrey Morse Date of Service: 04/04/2020 8:30 AM Medical Record Number: [id number] Patient Account Number: [id number] Date of Birth/Sex: Aug 29, 1955 (64 y.o. M) Treating RN: Huel Coventry Primary Care Candice Lunney: Blair Heys Other Clinician: Lolita Cram Referring Ioma Chismar: Blair Heys Treating Corban Kistler/Extender: Altamese [place] in Treatment: 0 Height (in): 71 Weight (lbs): 198 Body Mass Index (BMI): 27.6 Nutrition Risk Screening Items Score Screening NUTRITION RISK SCREEN: I have an illness or condition that made me change the kind and/or amount of food I eat 0 No I eat fewer than two meals per day 0 No I eat few fruits and vegetables, or milk products 0 No I have three or more drinks of beer, liquor or wine almost every day 0 No I have tooth or mouth problems that make it hard for me to eat 0 No I don't always have enough money to buy the food I need 0 No I eat alone most of the time 0 No I take three or more different prescribed or over-the-counter drugs a day 1 Yes Without wanting to, I have lost or gained 10 pounds in the last six months 2 Yes I am not always  physically able to shop, cook and/or feed myself 0 No Nutrition Protocols Good Risk Protocol Moderate Risk Protocol High Risk Proctocol Risk Level: Moderate Risk Score: 3 Electronic Signature(s) Signed: 04/04/2020 5:56:34 PM By: Elliot Gurney, BSN, RN, CWS, Kim RN, BSN Signed: 04/05/2020 1:24:27 PM By: Lolita Cram Entered By: Lolita Cram on 04/04/2020 08:55:18

## 2020-04-05 NOTE — Progress Notes (Signed)
SIR, MALLIS ([id number]) Visit Report for 04/04/2020 Allergy List Details Patient Name: Jeffrey Morse, Jeffrey Morse Date of Service: 04/04/2020 8:30 AM Medical Record Number: [id number] Patient Account Number: [id number] Date of Birth/Sex: 12/04/1955 (65 y.o. M) Treating RN: Huel Coventry Primary Care Lennis Korb: Blair Heys Other Clinician: Lolita Cram Referring Lalisa Kiehn: Blair Heys Treating Sidnie Swalley/Extender: Maxwell Caul [place] in Treatment: 0 Allergies Active Allergies No Known Allergies Allergy Notes Electronic Signature(s) Signed: 04/05/2020 1:24:27 PM By: Lolita Cram Entered By: Lolita Cram on 04/04/2020 08:56:05 Jeffrey Morse ([id number]) -------------------------------------------------------------------------------- Arrival Information Details Patient Name: Jeffrey Morse Date of Service: 04/04/2020 8:30 AM Medical Record Number: [id number] Patient Account Number: [id number] Date of Birth/Sex: 02/21/1955 (65 y.o. M) Treating RN: Huel Coventry Primary Care Loyda Costin: Blair Heys Other Clinician: Lolita Cram Referring Benett Swoyer: Blair Heys Treating Whitnie Deleon/Extender: Altamese [place] in Treatment: 0 Visit Information Patient Arrived: Ambulatory Arrival Time: 08:41 Accompanied By: wife Transfer Assistance: None Patient Identification Verified: Yes Secondary Verification Process Completed: Yes Patient Has Alerts: Yes Patient Alerts: Patient on Blood Thinner Type II Diabetic Electronic Signature(s) Signed: 04/05/2020 1:24:27 PM By: Lolita Cram Entered By: Lolita Cram on 04/04/2020 08:43:24 Jeffrey Morse ([id number]) -------------------------------------------------------------------------------- Clinic Level of Care Assessment Details Patient Name: Jeffrey Morse Date of Service: 04/04/2020 8:30 AM Medical Record Number: [id number] Patient Account Number: [id number] Date of Birth/Sex: [birth year] (65 y.o. M) Treating RN:  Huel Coventry Primary Care Samya Siciliano: Blair Heys Other Clinician: Lolita Cram Referring Nareh Matzke: Blair Heys Treating Emeri Estill/Extender: Altamese [place] in Treatment: 0 Clinic Level of Care Assessment Items TOOL 1 Quantity Score []  - Use when EandM and Procedure is performed on INITIAL visit 0 ASSESSMENTS - Nursing Assessment / Reassessment X - General Physical Exam (combine w/ comprehensive assessment (listed just below) when performed on new 1 20 pt. evals) X- 1 25 Comprehensive Assessment (HX, ROS, Risk Assessments, Wounds Hx, etc.) ASSESSMENTS - Wound and Skin Assessment / Reassessment []  - Dermatologic / Skin Assessment (not related to wound area) 0 ASSESSMENTS - Ostomy and/or Continence Assessment and Care []  - Incontinence Assessment and Management 0 []  - 0 Ostomy Care Assessment and Management (repouching, etc.) PROCESS - Coordination of Care X - Simple Patient / Family Education for ongoing care 1 15 []  - 0 Complex (extensive) Patient / Family Education for ongoing care []  - 0 Staff obtains , Records, Test Results / Process Orders []  - 0 Staff telephones HHA, Nursing Homes / Clarify orders / etc []  - 0 Routine Transfer to another Facility (non-emergent condition) []  - 0 Routine Hospital Admission (non-emergent condition) X- 1 15 New Admissions / / Ordering NPWT, Apligraf, etc. []  - 0 Emergency Hospital Admission (emergent condition) PROCESS - Special Needs []  - Pediatric / Minor Patient Management 0 []  - 0 Isolation Patient Management []  - 0 Hearing / Language / Visual special needs []  - 0 Assessment of Community assistance (transportation, D/C planning, etc.) []  - 0 Additional assistance / Altered mentation []  - 0 Support Surface(s) Assessment (bed, cushion, seat, etc.) INTERVENTIONS - Miscellaneous []  - External ear exam 0 []  - 0 Patient Transfer (multiple staff / / Similar devices) []  -  0 Simple Staple / Suture removal (25 or less) []  - 0 Complex Staple / Suture removal (26 or more) []  - 0 Hypo/Hyperglycemic Management (do not check if billed separately) []  - 0 Ankle / Brachial Index (ABI) - do not check if billed separately Has the patient been seen at the hospital within the  last three years: Yes Total Score: 75 Level Of Care: New/Established - Level 2 ZAHKI, HOOGENDOORN ([id number]) Electronic Signature(s) Signed: 04/05/2020 9:31:34 AM By: Elliot Gurney, BSN, RN, CWS, Kim RN, BSN Entered By: Elliot Gurney, BSN, RN, CWS, Kim on 04/04/2020 18:36:04 Jeffrey Morse ([id number]) -------------------------------------------------------------------------------- Encounter Discharge Information Details Patient Name: Jeffrey Morse Date of Service: 04/04/2020 8:30 AM Medical Record Number: [id number] Patient Account Number: [id number] Date of Birth/Sex: 1956/01/25 (65 y.o. M) Treating RN: Huel Coventry Primary Care Maude Gloor: Blair Heys Other Clinician: Lolita Cram Referring Maymie Brunke: Blair Heys Treating Mitchael Luckey/Extender: Altamese [place] in Treatment: 0 Encounter Discharge Information Items Post Procedure Vitals Discharge Condition: Stable Temperature (F): 98.0 Ambulatory Status: Ambulatory Pulse (bpm): 66 Discharge Destination: Home Respiratory Rate (breaths/min): 18 Transportation: Private Auto Blood Pressure (mmHg): 131/89 Accompanied By: self Schedule Follow-up Appointment: Yes Clinical Summary of Care: Electronic Signature(s) Signed: 04/04/2020 5:56:34 PM By: Elliot Gurney, BSN, RN, CWS, Kim RN, BSN Entered By: Elliot Gurney, BSN, RN, CWS, Kim on 04/04/2020 09:56:56 Jeffrey Morse ([id number]) -------------------------------------------------------------------------------- Lower Extremity Assessment Details Patient Name: Jeffrey Morse Date of Service: 04/04/2020 8:30 AM Medical Record Number: [id number] Patient Account Number: [id number] Date of Birth/Sex:  06/20/1955 (65 y.o. M) Treating RN: Huel Coventry Primary Care Rourke Mcquitty: Blair Heys Other Clinician: Lolita Cram Referring Gustin Zobrist: Blair Heys Treating Thomas Mabry/Extender: Altamese [place] in Treatment: 0 Electronic Signature(s) Signed: 04/04/2020 5:56:34 PM By: Elliot Gurney BSN, RN, CWS, Kim RN, BSN Signed: 04/05/2020 1:24:27 PM By: Lolita Cram Entered By: Lolita Cram on 04/04/2020 08:56:17 Jeffrey Morse ([id number]) -------------------------------------------------------------------------------- Multi Wound Chart Details Patient Name: Jeffrey Morse Date of Service: 04/04/2020 8:30 AM Medical Record Number: [id number] Patient Account Number: [id number] Date of Birth/Sex: [birth year] (65 y.o. M) Treating RN: Huel Coventry Primary Care Lafaye Mcelmurry: Blair Heys Other Clinician: Lolita Cram Referring Yunuen Mordan: Blair Heys Treating Kaimana Lurz/Extender: Altamese [place] in Treatment: 0 Vital Signs Height(in): 71 Capillary Blood Glucose 114 (mg/dl): Weight(lbs): 299 Pulse(bpm): 67 Body Mass Index(BMI): 28 Blood Pressure(mmHg): 126/86 Temperature(F): 98.0 Respiratory Rate(breaths/min): 18 Photos: [N/A:N/A] Wound Location: Head - occiput N/A N/A Wounding Event: Pressure Injury N/A N/A Primary Etiology: To be determined N/A N/A Comorbid History: Congestive Heart Failure, N/A N/A Hypertension, Myocardial Infarction, Type II Diabetes Date Acquired: 03/06/2020 N/A N/A Weeks of Treatment: 0 N/A N/A Wound Status: Open N/A N/A Measurements L x W x D (cm) 3.5x3.5x0.1 N/A N/A Area (cm) : 9.621 N/A N/A Volume (cm) : 0.962 N/A N/A % Reduction in Area: 0.00% N/A N/A % Reduction in Volume: 0.00% N/A N/A Classification: Full Thickness Without Exposed N/A N/A Support Structures Exudate Amount: Small N/A N/A Exudate Type: Serosanguineous N/A N/A Exudate Color: red, brown N/A N/A Wound Margin: Flat and Intact N/A N/A Granulation Amount: Large (67-100%)  N/A N/A Granulation Quality: Red N/A N/A Necrotic Amount: Small (1-33%) N/A N/A Exposed Structures: Fat Layer (Subcutaneous Tissue): N/A N/A Yes Fascia: No Tendon: No Muscle: No Joint: No Bone: No Epithelialization: None N/A N/A Procedures Performed: Biopsy N/A N/A Treatment Notes Wound #1 (Head - occiput) Cleanser Peri-Wound Care AKIN, YI ([id number]) Topical Primary Dressing Silvercel Small 2x2 (in/in) Discharge Instruction: Apply Silvercel Small 2x2 (in/in) as instructed Secondary Dressing Gauze Discharge Instruction: As directed: dry, moistened with saline or moistened with Dakins Solution Secured With Tegaderm Film 4x4 (in/in) Discharge Instruction: Apply to wound bed Compression Wrap Compression Stockings Add-Ons Electronic Signature(s) Signed: 04/04/2020 5:07:57 PM By: Baltazar Najjar MD Entered By: Baltazar Najjar on 04/04/2020 10:01:41 Jeffrey Morse ([id number]) -------------------------------------------------------------------------------- Multi-Disciplinary Care Plan  Details Patient Name: KOLBEY, TEICHERT Date of Service: 04/04/2020 8:30 AM Medical Record Number: [id number] Patient Account Number: [id number] Date of Birth/Sex: 1955-11-22 (65 y.o. M) Treating RN: Huel Coventry Primary Care Azelie Noguera: Blair Heys Other Clinician: Lolita Cram Referring Cyntha Brickman: Blair Heys Treating Brittiany Wiehe/Extender: Altamese [place] in Treatment: 0 Active Inactive Necrotic Tissue Nursing Diagnoses: Impaired tissue integrity related to necrotic/devitalized tissue Knowledge deficit related to management of necrotic/devitalized tissue Goals: Necrotic/devitalized tissue will be minimized in the wound bed Date Initiated: 04/04/2020 Target Resolution Date: 04/18/2020 Goal Status: Active Patient/caregiver will verbalize understanding of reason and process for debridement of necrotic tissue Date Initiated: 04/04/2020 Target Resolution Date:  04/18/2020 Goal Status: Active Interventions: Assess patient pain level pre-, during and post procedure and prior to discharge Provide education on necrotic tissue and debridement process Treatment Activities: Apply topical anesthetic as ordered : 04/04/2020 Notes: Orientation to the Wound Care Program Nursing Diagnoses: Knowledge deficit related to the wound healing center program Goals: Patient/caregiver will verbalize understanding of the Wound Healing Center Program Date Initiated: 04/04/2020 Target Resolution Date: 04/18/2020 Goal Status: Active Interventions: Provide education on orientation to the wound center Notes: Wound/Skin Impairment Nursing Diagnoses: Impaired tissue integrity Knowledge deficit related to smoking impact on wound healing Knowledge deficit related to ulceration/compromised skin integrity Goals: Ulcer/skin breakdown will have a volume reduction of 30% by week 4 Date Initiated: 04/04/2020 Target Resolution Date: 04/18/2020 Goal Status: Active Interventions: Assess patient/caregiver ability to obtain necessary supplies Assess ulceration(s) every visit DAIMEN, SHOVLIN ([id number]) Treatment Activities: Topical wound management initiated : 04/04/2020 Notes: Electronic Signature(s) Signed: 04/04/2020 5:56:34 PM By: Elliot Gurney, BSN, RN, CWS, Kim RN, BSN Entered By: Elliot Gurney, BSN, RN, CWS, Kim on 04/04/2020 09:22:26 Jeffrey Morse ([id number]) -------------------------------------------------------------------------------- Pain Assessment Details Patient Name: Jeffrey Morse Date of Service: 04/04/2020 8:30 AM Medical Record Number: [id number] Patient Account Number: [id number] Date of Birth/Sex: [birth year] (65 y.o. M) Treating RN: Huel Coventry Primary Care Morgen Ritacco: Blair Heys Other Clinician: Lolita Cram Referring Emaya Preston: Blair Heys Treating Johnwesley Lederman/Extender: Altamese [place] in Treatment: 0 Active Problems Location of Pain Severity and  Description of Pain Patient Has Paino No Site Locations Pain Management and Medication Current Pain Management: Electronic Signature(s) Signed: 04/04/2020 5:56:34 PM By: Elliot Gurney, BSN, RN, CWS, Kim RN, BSN Signed: 04/05/2020 1:24:27 PM By: Lolita Cram Entered By: Lolita Cram on 04/04/2020 08:43:38 Jeffrey Morse ([id number]) -------------------------------------------------------------------------------- Patient/Caregiver Education Details Patient Name: Jeffrey Morse Date of Service: 04/04/2020 8:30 AM Medical Record Number: [id number] Patient Account Number: [id number] Date of Birth/Gender: 1955/05/24 (65 y.o. M) Treating RN: Huel Coventry Primary Care Physician: Blair Heys Other Clinician: Lolita Cram Referring Physician: Blair Heys Treating Physician/Extender: Altamese [place] in Treatment: 0 Education Assessment Education Provided To: Patient Education Topics Provided Wound Debridement: Handouts: Wound Debridement Methods: Demonstration, Explain/Verbal Responses: State content correctly Electronic Signature(s) Signed: 04/04/2020 5:56:34 PM By: Elliot Gurney, BSN, RN, CWS, Kim RN, BSN Entered By: Elliot Gurney, BSN, RN, CWS, Kim on 04/04/2020 13:09:13 Jeffrey Morse ([id number]) -------------------------------------------------------------------------------- Wound Assessment Details Patient Name: Jeffrey Morse Date of Service: 04/04/2020 8:30 AM Medical Record Number: [id number] Patient Account Number: [id number] Date of Birth/Sex: [birth year] (65 y.o. M) Treating RN: Huel Coventry Primary Care Devian Bartolomei: Blair Heys Other Clinician: Lolita Cram Referring Lamark Schue: Blair Heys Treating Jenicka Coxe/Extender: Altamese  in Treatment: 0 Wound Status Wound Number: 1 Primary Atypical Etiology: Wound Location: Head - occiput Wound Status: Open Wounding Event: Pressure Injury Comorbid Congestive Heart Failure, Hypertension, Myocardial Date  Acquired: 03/06/2020  History: Infarction, Type II Diabetes Weeks Of Treatment: 0 Clustered Wound: No Photos Wound Measurements Length: (cm) 3.5 Width: (cm) 3.5 Depth: (cm) 0.1 Area: (cm) 9.621 Volume: (cm) 0.962 % Reduction in Area: 0% % Reduction in Volume: 0% Epithelialization: None Tunneling: No Undermining: No Wound Description Classification: Full Thickness Without Exposed Support Structu Wound Margin: Flat and Intact Exudate Amount: Medium Exudate Type: Serosanguineous Exudate Color: red, brown res Foul Odor After Cleansing: No Slough/Fibrino Yes Wound Bed Granulation Amount: Large (67-100%) Exposed Structure Granulation Quality: Red Fascia Exposed: No Necrotic Amount: Small (1-33%) Fat Layer (Subcutaneous Tissue) Exposed: Yes Necrotic Quality: Adherent Slough Tendon Exposed: No Muscle Exposed: No Joint Exposed: No Bone Exposed: No Treatment Notes Wound #1 (Head - occiput) Cleanser Peri-Wound Care Topical FINNIS, SWALLEY ([id number]) Primary Dressing Silvercel Small 2x2 (in/in) Discharge Instruction: Apply Silvercel Small 2x2 (in/in) as instructed Secondary Dressing Gauze Discharge Instruction: As directed: dry, moistened with saline or moistened with Dakins Solution Secured With Tegaderm Film 4x4 (in/in) Discharge Instruction: Apply to wound bed Compression Wrap Compression Stockings Add-Ons Electronic Signature(s) Signed: 04/04/2020 1:07:33 PM By: Elliot Gurney, BSN, RN, CWS, Kim RN, BSN Entered By: Elliot Gurney, BSN, RN, CWS, Kim on 04/04/2020 13:07:33 Jeffrey Morse ([id number]) -------------------------------------------------------------------------------- Vitals Details Patient Name: Jeffrey Morse Date of Service: 04/04/2020 8:30 AM Medical Record Number: [id number] Patient Account Number: [id number] Date of Birth/Sex: 1956-01-07 (65 y.o. M) Treating RN: Huel Coventry Primary Care Elda Dunkerson: Blair Heys Other Clinician: Lolita Cram Referring  April Colter: Blair Heys Treating Oracio Galen/Extender: Altamese  in Treatment: 0 Vital Signs Time Taken: 08:43 Temperature (F): 98.0 Height (in): 71 Pulse (bpm): 67 Weight (lbs): 198 Respiratory Rate (breaths/min): 18 Source: Measured Blood Pressure (mmHg): 126/86 Body Mass Index (BMI): 27.6 Capillary Blood Glucose (mg/dl): 163 Reference Range: 80 - 120 mg / dl Electronic Signature(s) Signed: 04/04/2020 5:56:34 PM By: Elliot Gurney, BSN, RN, CWS, Kim RN, BSN Entered By: Elliot Gurney, BSN, RN, CWS, Kim on 04/04/2020 09:48:43

## 2020-04-07 NOTE — Progress Notes (Incomplete)
***In Progress*** PCP: Dr. Manus Gunning HF Cardiologist: Dr. Gala Romney  HPI:  Mr.Tannehillis a 65 year old male with no prior cardiac history.   Presented to urgent care 02/15/2020 with chest pain where he collapsed and CPR initiated. CPR was done for 15 minutes. He lost pulse again and went into PEA arrest and CPR resumed. Echo with severely reduced LV systolic function, no evidence of pericardial effusion. EKG with ST elevation with lateral involvement. Emergent cardiac catheterization showed totally occluded mid LCx treated with PCI & Impella placed for cardiogenic shock. Recurrent VT after cardiac cath, defibrillated x1, maintained on amiodarone. Neurology consulted 02/17/2020 for left-sided weakness. EEG negative. 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 Maintained on aspirin and Plavix for CVA prophylaxis.Venous Doppler studies negative. He was discharged to [facility] for on-going therapy. Discharge weight 207 lbs.  **ECHOs: 1/12 cath > EF 25% 1/13 ECHO > EF 50-55% ? RV normal  He recently returned for post-hospital follow-up on 03/16/20 with [name], FNP. He was overall feeling good. Walked his driveway daily (800 feet one way) and was trying to stay active. Denied increasing SOB, CP, dizziness, edema, or PND/Orthopnea. Appetite ok. Weight at home ~197 lbs. Was taking all medications. Legs were weak, but no other deficits. Completed CIR and was starting [facility] PT/OT. He was struggling with recent vision changes, off amiodarone since hospitalization.  Today he returns to HF clinic for pharmacist medication titration. At last visit with APP, midodrine was stopped given resolved orthostatic hypotension.   135/84 (on midodrine), 72, 198 lbs  - Called [facility] w/ c/o low BP > ok since then, was 120/80 at rehab A) Farxiga (need PA, copay card) B) Are we still on lisin? Arb/incr spiro pend BP at next visit, labs ok - Going to rehab, later on could incr  coreg once regains all fx F/u: 3/28 APP  Overall feeling ***. Dizziness, lightheadedness, fatigue:  Chest pain or palpitations:  How is your breathing?: *** SOB: Able to complete all ADLs. Activity level ***  Weight at home pounds. Takes furosemide/torsemide/bumex *** mg *** daily.  LEE PND/Orthopnea  Appetite *** Low-salt diet:   Physical Exam Cost/affordability of meds  HF Medications: Carvedilol 3.125 mg BID Lisinopril 10 mg daily ??? Spironolactone 12.5 mg daily  Has the patient been experiencing any side effects to the medications prescribed?  {YES NO:22349}  Does the patient have any problems obtaining medications due to transportation or finances?   {YES WU:98119} - Has [employer]  Understanding of regimen: {excellent/good/fair/poor:19665} Understanding of indications: {excellent/good/fair/poor:19665} Potential of compliance: {excellent/good/fair/poor:19665} Patient understands to avoid NSAIDs. Patient understands to avoid decongestants.    Pertinent Lab Values: . Serum creatinine ***, BUN ***, Potassium ***, Sodium ***, BNP ***, Magnesium ***, Digoxin ***   Vital Signs: . Weight: *** (last clinic weight: ***) . Blood pressure: ***  . Heart rate: ***   Assessment/Plan: 1. H/O Cardiac (VT arrest in setting of acute inferolateral STEMI/VT) - OOH cardiac arrest on 02/15/20 with prolonged CPR, resuscitated w/ CPR + defibrillation  - Emergent cath 02/15/20 w/ totally occluded mid LCx treated w/ PCI + DES - Recurrent VT on 1/14 w/ defib x1 - Off amiodarone.  - Continue carvedilol 3.125 mg BID.  2. Chronic systolic HF - EF (02/15/20): 25% in cath lab.  - Echo (02/16/20): EF 50-55%. - NYHA II, euvolemic today. He does not need daily lasix. - Continue carvedilol 3.125 mg BID. - Continue lisinopril 10 mg daily** ???  - Continue spironolactone  12.5 mg daily. - Follow-up with APP Clinic on 04/30/20  3. CAD  - Emergent cath 1/12 w/ totally occluded mid LCx  treated w/ PCI + DES -There is moderate ostial LAD disease and significant disease in the distal LAD close to the apex. No significant disease affecting the right coronary artery. - No chest pain. - Continue DAPT + rosuvastatin. -Continue carvedilol.  4. H/o CVA with ICH - MRI with multiple small infarcts and small ICH (Likely cardioembolic from arrest). - L sided weakness resolved.  5.Orthostatic Hypotension - Resolved. No further symptoms - Stop midodrine.  6. DM2 - HgbA1c 6.0. - Continue Metformin. - Consider adding Farxiga at next visit if orthostatic symptoms have not returned with discontinuation of midodrine.  Tama Headings, PharmD, BCPS PGY2 Cardiology Pharmacy Resident  Karle Plumber, PharmD, BCPS, [facility], CPP Heart Failure Clinic Pharmacist [phone]

## 2020-04-09 ENCOUNTER — Ambulatory Visit: Payer: BC Managed Care – PPO | Admitting: Occupational Therapy

## 2020-04-09 ENCOUNTER — Encounter: Payer: Self-pay | Admitting: Physical Therapy

## 2020-04-09 ENCOUNTER — Encounter: Payer: Self-pay | Admitting: Occupational Therapy

## 2020-04-09 ENCOUNTER — Ambulatory Visit (HOSPITAL_COMMUNITY)
Admission: RE | Admit: 2020-04-09 | Discharge: 2020-04-09 | Disposition: A | Discharge status: Home / Self Care | Payer: BC Managed Care – PPO | Source: Ambulatory Visit | Attending: Internal Medicine | Admitting: Internal Medicine

## 2020-04-09 ENCOUNTER — Ambulatory Visit: Payer: BC Managed Care – PPO | Admitting: Physician Assistant

## 2020-04-09 ENCOUNTER — Ambulatory Visit: Payer: BC Managed Care – PPO | Attending: Physician Assistant | Admitting: Physical Therapy

## 2020-04-09 ENCOUNTER — Other Ambulatory Visit: Payer: Self-pay

## 2020-04-09 VITALS — BP 122/82 | HR 73 | Wt 200.4 lb

## 2020-04-09 DIAGNOSIS — I5022 Chronic systolic (congestive) heart failure: Secondary | ICD-10-CM | POA: Insufficient documentation

## 2020-04-09 DIAGNOSIS — R2689 Other abnormalities of gait and mobility: Secondary | ICD-10-CM | POA: Diagnosis present

## 2020-04-09 DIAGNOSIS — R41842 Visuospatial deficit: Secondary | ICD-10-CM

## 2020-04-09 DIAGNOSIS — R41840 Attention and concentration deficit: Secondary | ICD-10-CM | POA: Diagnosis present

## 2020-04-09 DIAGNOSIS — R2681 Unsteadiness on feet: Secondary | ICD-10-CM | POA: Insufficient documentation

## 2020-04-09 DIAGNOSIS — R41844 Frontal lobe and executive function deficit: Secondary | ICD-10-CM | POA: Insufficient documentation

## 2020-04-09 DIAGNOSIS — I69854 Hemiplegia and hemiparesis following other cerebrovascular disease affecting left non-dominant side: Secondary | ICD-10-CM

## 2020-04-09 DIAGNOSIS — M6281 Muscle weakness (generalized): Secondary | ICD-10-CM

## 2020-04-09 DIAGNOSIS — R278 Other lack of coordination: Secondary | ICD-10-CM | POA: Insufficient documentation

## 2020-04-09 MED ORDER — SPIRONOLACTONE 25 MG PO TABS: 12.5000 mg | ORAL_TABLET | Freq: Every day | ORAL | 11 refills | Status: DC

## 2020-04-09 MED ORDER — CLOPIDOGREL BISULFATE 75 MG PO TABS: 75.0000 mg | ORAL_TABLET | Freq: Every day | ORAL | 11 refills | Status: DC

## 2020-04-09 MED ORDER — CARVEDILOL 3.125 MG PO TABS: 3.1250 mg | ORAL_TABLET | Freq: Two times a day (BID) | ORAL | 11 refills | Status: DC

## 2020-04-09 MED ORDER — EMPAGLIFLOZIN 10 MG PO TABS: 10.0000 mg | ORAL_TABLET | Freq: Every day | ORAL | 11 refills | Status: DC

## 2020-04-09 MED ORDER — ROSUVASTATIN CALCIUM 40 MG PO TABS: 40.0000 mg | ORAL_TABLET | Freq: Every day | ORAL | 11 refills | Status: DC

## 2020-04-09 NOTE — Patient Instructions (Signed)
It was a pleasure seeing you today!  MEDICATIONS: -We are changing your medications today -Start Jardiance 10 mg (1 tablet) daily -Stop metformin -Call if you have questions about your medications.   NEXT APPOINTMENT: Return to clinic in 3 weeks with APP Clinic.  In general, to take care of your heart failure: -Limit your fluid intake to 2 Liters (half-gallon) per day.   -Limit your salt intake to ideally 2-3 grams (2000-3000 mg) per day. -Weigh yourself daily and record, and bring that "weight diary" to your next appointment.  (Weight gain of 2-3 pounds in 1 day typically means fluid weight.) -The medications for your heart are to help your heart and help you live longer.   -Please contact us before stopping any of your heart medications.  Call the clinic at [phone] with questions or to reschedule future appointments.

## 2020-04-09 NOTE — Progress Notes (Signed)
PCP: Dr. Manus Gunning HF Cardiologist: Dr. Gala Romney  HPI:  Jeffrey Morse a 65 year old male with no prior cardiac history.   Presented to urgent care 02/15/2020 with chest pain where he collapsed and C PR initiated. CPR was done for 15 minutes. He lost pulse again and went into PEA arrest and CPR resumed. Echo with severely reduced LV systolic function, no evidence of pericardial effusion. EKG with ST elevation with lateral involvement. Emergent cardiac catheterization showed totally occluded mid LCx treated with PCI & Impella placed for cardiogenic shock. ECHO on 02/16/20 showed LVEF 50-55%. Recurrent VT after cardiac cath, defibrillated x1, maintained on amiodarone. Neurology consulted 02/17/2020 for left-sided weakness. EEG negative. 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 Maintained on aspirin and Plavix for CVA prophylaxis.Venous Doppler studies negative. He was discharged to [name] for on-going therapy. Discharge weight 207 lbs.  He recently returned for post-hospital follow-up on 03/16/20 with Jeffrey Rome, FNP. He was overall feeling good. Walked his driveway daily (800 feet one way) and was trying to stay active. Denied increasing SOB, CP, dizziness, edema, or PND/Orthopnea. Appetite ok. Weight at home ~197 lbs. Was taking all medications. Legs were weak, but no other deficits. Completed CIR and was starting [name] PT/OT. He was struggling with recent vision changes, off amiodarone since hospitalization.  Today he returns to HF clinic for pharmacist medication titration. At last visit with APP, midodrine was stopped given resolved orthostatic hypotension. He is feeling great overall. He has graduated from OT and is on track to graduate from PT in a few weeks. Denies dizziness, lightheadedness, fatigue. No CP or palpitations. Able to walk 1600 feet without feeling SOB. Weight stable at home ~196-199 lbs. No LEE, PND/Orthopnea. Appetite is good and does his  best to follow a low-salt diet. Due for refills on all of his medications.   HF Medications: Carvedilol 3.125 mg BID Spironolactone 12.5 mg daily  Has the patient been experiencing any side effects to the medications prescribed?  no  Does the patient have any problems obtaining medications due to transportation or finances?   no - Has [name] Insurance  Understanding of regimen: good Understanding of indications: good Potential of compliance: good Patient understands to avoid NSAIDs. Patient understands to avoid decongestants.    Pertinent Lab Values (03/03/20): Marland Kitchen Serum creatinine 1.02, BUN 26, Potassium 4.3, Sodium 135  Vital Signs: . Weight: 200.4 lbs (last clinic weight: 198 lbs) . Blood pressure: 122/82  . Heart rate: 73   Assessment/Plan: 1. H/O Cardiac (VT arrest in setting of acute inferolateral STEMI/VT) - OOH cardiac arrest on 02/15/20 with prolonged CPR, resuscitated w/ CPR + defibrillation  - Emergent cath 02/15/20 w/ totally occluded mid LCx treated w/ PCI + DES - Recurrent VT on 1/14 w/ defib x1 - Off amiodarone.  - Continue carvedilol 3.125 mg BID.  2. Chronic systolic HF - EF (02/15/20): 25% in cath lab.  - Echo (02/16/20): EF 50-55%. - NYHA II, euvolemic today. He does not need daily furosemide. - Continue carvedilol 3.125 mg BID. - Continue spironolactone 12.5 mg daily. - Start Jardiance 10 mg daily. Gave patient $10 copay card. - Sent refills for all medications - Follow-up with APP Clinic on 04/30/20  3. CAD  - Emergent cath 1/12 w/ totally occluded mid LCx treated w/ PCI + DES -There is moderate ostial LAD disease and significant disease in the distal LAD close to the apex. No significant disease affecting the right coronary artery. - No chest pain. -  Continue DAPT + rosuvastatin. Sent refills for clopidogrel and rosuvastatin. -Continue carvedilol as above  4. H/o CVA with ICH - MRI with multiple small infarcts and small ICH (Likely  cardioembolic from arrest). - L sided weakness resolved.  5.Orthostatic Hypotension - Resolved. No further symptoms off midodrine.  6. Pre-diabetic - HgbA1c 6.1, does not qualify as a diabetic. Started on metformin while in hospital. - Stop Metformin. Would repeat A1C in 3 months.   - Start Jardiance as above  Tama Headings, PharmD, BCPS PGY2 Cardiology Pharmacy Resident  Karle Plumber, PharmD, BCPS, [facility], CPP Heart Failure Clinic Pharmacist [phone]

## 2020-04-09 NOTE — Therapy (Signed)
[facility] [address] [place], [place], [address] Phone: [phone]   Fax:  [phone]  Physical Therapy Treatment  Patient Details  Name: Jeffrey Morse MRN: [id number] Date of Birth: 03/10/1955 Referring Provider (PT): Charlton Amor, PA-C   Encounter Date: 04/09/2020   PT End of Session - 04/09/20 1354    Visit Number 7    Number of Visits 13    Date for PT Re-Evaluation 05/12/20   POC for 6 weeks, Cert for 60 days   Authorization Type BCBS    PT Start Time 1310    PT Stop Time 1352    PT Time Calculation (min) 42 min    Equipment Utilized During Treatment Gait belt    Activity Tolerance Patient tolerated treatment well    Behavior During Therapy WFL for tasks assessed/performed           Past Medical History:  Diagnosis Date   HTN (hypertension)    Hypercholesteremia    Hypertension    Borderline hypertension   Sleep apnea    Stroke [facility])     Past Surgical History:  Procedure Laterality Date   BACK SURGERY     CARDIAC CATHETERIZATION     CORONARY STENT INTERVENTION N/A 02/15/2020   Procedure: CORONARY STENT INTERVENTION;  Surgeon: Iran Ouch, MD;  Location: [facility] INVASIVE CV LAB;  Service: Cardiovascular;  Laterality: N/A;  CFX   KNEE SURGERY Left 2012   LEFT HEART CATH AND CORONARY ANGIOGRAPHY N/A 02/15/2020   Procedure: LEFT HEART CATH AND CORONARY ANGIOGRAPHY;  Surgeon: Iran Ouch, MD;  Location: [facility] INVASIVE CV LAB;  Service: Cardiovascular;  Laterality: N/A;   RIGHT HEART CATH N/A 02/15/2020   Procedure: RIGHT HEART CATH;  Surgeon: Iran Ouch, MD;  Location: [facility] INVASIVE CV LAB;  Service: Cardiovascular;  Laterality: N/A;   VENTRICULAR ASSIST DEVICE INSERTION N/A 02/15/2020   Procedure: VENTRICULAR ASSIST DEVICE INSERTION;  Surgeon: Iran Ouch, MD;  Location: [facility] INVASIVE CV LAB;  Service: Cardiovascular;  Laterality: N/A;  iMPELLA     There were no  vitals filed for this visit.   Subjective Assessment - 04/09/20 1312    Subjective Saw the wound doctor and got a biopsy done too. Everything else is going well, doing a lot of walking. Has been kicking a soccer ball outside too. Able to get in and out of the bathtub by himself.    Patient is accompained by: Family member   Wife Teacher, adult education)   Pertinent History HTN, Hyperlipidemia    Limitations Walking;Standing    Diagnostic tests CT/MRI of the brain showed multiple small acute infarcts within the frontal lobes. Echocardiogram revealed severely reduced LV systolic function no evidence of pericardial effusion.  EKG after CPR showed evidence of inferior ST elevation with lateral involvement suggestive of left circumflex occlusion    Patient Stated Goals Build the strength    Currently in Pain? Yes    Pain Score 3     Pain Location Back   lower back   Pain Orientation Mid;Left    Pain Descriptors / Indicators Aching    Pain Type Chronic pain    Pain Onset More than a month ago    Pain Relieving Factors stretching, lying down    Pain Onset More than a month ago                            Access Code:  [email or web address] URL: [email or web address] Date: 04/09/2020 Prepared by: Sherlie Ban  Upgraded HEP: See MedBridge for more details.  Exercises Romberg Stance on Foam Pad with Head Rotation - 1 x daily - 5 x weekly - 2 sets - 10 reps - feet together with eyes closed on 2 pillows, with head nods x10 reps as well  Sit to Stand with Arms Crossed - 2-3 x daily - 5 x weekly - 2 sets - 5 reps Tandem Walking with Counter Support - 2 x daily - 5 x weekly - 3 sets Standing Marching - 2 x daily - 5 x weekly - 3 sets - upgraded to use of green tband resistance around thighs  Side Stepping with Resistance at Thighs - 2 x daily - 5 x weekly - 2 sets - with green tband, verbal and demo cues for technique Forward Step Up - 1 x daily - 5 x weekly - 1-2 sets - 10 reps - with fingertip  support and floating non-stance leg for more dynamic SLS        Balance Exercises - 04/09/20 0001      Balance Exercises: Standing   Other Standing Exercises on black side of BOSU with no UE support: weight shifting M/L and A/P x10 reps each direction, x10 reps min squats, min guard for balance    Other Standing Exercises Comments on blue side of BOSU with B fingertip support: x10 reps heel<>toe raises, lateral step ups with each leg x10 reps (floating non-stance leg)             PT Education - 04/09/20 1354    Education Details upgraded HEP. discussed more than likely won't need all scheduled visits due to pt's progress    Person(s) Educated Patient    Methods Explanation;Demonstration;Handout    Comprehension Verbalized understanding;Returned demonstration            PT Short Term Goals - 04/02/20 1331      PT SHORT TERM GOAL #1   Title Patient will be independent with initial HEP for balance/strengthening (ALL STGs Due: 04/03/2020)    Baseline pt reports independent with initial HEP    Time 3    Period Weeks    Status Achieved    Target Date 04/03/20      PT SHORT TERM GOAL #2   Title FGA to be assessed with LTG to be set as appropriate    Baseline 26/30 - LTG written as appropriate.    Time 3    Period Weeks    Status Achieved      PT SHORT TERM GOAL #3   Title Patient will improve 5x sit <> stand to </= 20 seconds without UE support to demo improved balance    Baseline 23.52 secs, 13.97 seconds on 04/02/20    Time 3    Period Weeks    Status Achieved             PT Long Term Goals - 03/19/20 1636      PT LONG TERM GOAL #1   Title Patient will be independent with Final HEP for balance/strengthening (All LTGs Due: 04/24/20)    Baseline no HEP established    Time 6    Period Weeks    Status New      PT LONG TERM GOAL #2   Title Pt will improve FGA score to at least a 29/30 in order to demo decr fall risk.    Baseline 26/30 on 03/19/20    Time  6     Period Weeks    Status Revised      PT LONG TERM GOAL #3   Title Patient will improve 5x sit <> stand to </= 15 seconds to demonstrate improved balance and reduced fall risk    Baseline 23.52 secs w/o UE support    Time 6    Period Weeks    Status New      PT LONG TERM GOAL #4   Title Patient will be able to ascend/descend 8 stairs with reciprocal pattern and no rails to demonstrate improved ability to enter/exit home    Baseline 4 steps with recirpocal B rails    Time 6    Period Weeks    Status New      PT LONG TERM GOAL #5   Title Patient willl demo ability to ambulate >/= 1000 ft on outdoor various surfaces without AD and Mod I    Baseline 115 ft supervision indoors    Time 6    Period Weeks    Status New                 Plan - 04/09/20 1355    Clinical Impression Statement Upgraded pt's HEP to add in additional BLE strengthening with green tband resistance and progressed balance with eyes closed to more narrow BOS on a thicker surface. Remainder of session focused on balance strategies on compliant surface, with pt tolerating session well. Will continue to progress towards LTGs.    Personal Factors and Comorbidities Comorbidity 1    Comorbidities HTN, Hyperlipidemia    Examination-Activity Limitations Stairs;Stand;Squat;Locomotion Level    Examination-Participation Restrictions Occupation;Yard Work;Community Activity;Driving    Stability/Clinical Decision Making Stable/Uncomplicated    Rehab Potential Good    PT Frequency 2x / week    PT Duration 6 weeks    PT Treatment/Interventions ADLs/Self Care Home Management;Cryotherapy;Moist Heat;DME Instruction;Gait training;Stair training;Functional mobility training;Therapeutic activities;Therapeutic exercise;Balance training;Neuromuscular re-education;Patient/family education;Orthotic Fit/Training;Manual techniques;Passive range of motion;Vestibular    PT Next Visit Plan how are new additions to HEP? Continue high level  balance, working on SLS, eyes closed,  Dual Tasking activities. Functional BLE strengthening. checking LTGs in a couple of sessions for early D/C.    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)  Muscle weakness (generalized)  Other abnormalities of gait and mobility  Unsteadiness on feet     Problem List Patient Active Problem List   Diagnosis Date Noted   Ischemic cerebrovascular accident (CVA) of frontal lobe (HCC) 02/28/2020   Cerebral thrombosis with cerebral infarction 02/18/2020   Subarachnoid hemorrhage 02/18/2020   Cardiogenic shock (HCC) 02/15/2020   Cardiac arrest (HCC)    ST elevation myocardial infarction (STEMI) (HCC)    Encounter for imaging study to confirm orogastric (OG) tube placement    Acute respiratory failure with hypoxia (HCC)    OSA (obstructive sleep apnea) 09/01/2016   Hyperlipidemia 01/18/2012   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  [place], [place], [address] Phone: [phone]   Fax:  [phone]  Name: OLAF MESA MRN: [id number] Date of Birth: 1955-11-21

## 2020-04-09 NOTE — Therapy (Signed)
[facility] [address] [place], [place], [address] Phone: [phone]   Fax:  [phone]  Occupational Therapy Treatment & Discharge  Patient Details  Name: Jeffrey Morse MRN: [id number] Date of Birth: Apr 08, 1955 Referring Provider (OT): Alysia Penna   Encounter Date: 04/09/2020   OT End of Session - 04/09/20 1234    Visit Number 7    Number of Visits 13    Date for OT Re-Evaluation 04/24/20    Authorization Type [employer]    Authorization Time Period VL:MN    OT Start Time 1232    OT Stop Time 1257   d/c session   OT Time Calculation (min) 25 min    Activity Tolerance Patient tolerated treatment well    Behavior During Therapy [facility] for tasks assessed/performed           Past Medical History:  Diagnosis Date  . HTN (hypertension)   . Hypercholesteremia   . Hypertension    Borderline hypertension  . Sleep apnea   . Stroke [facility])     Past Surgical History:  Procedure Laterality Date  . BACK SURGERY    . CARDIAC CATHETERIZATION    . CORONARY STENT INTERVENTION N/A 02/15/2020   Procedure: CORONARY STENT INTERVENTION;  Surgeon: Wellington Hampshire, MD;  Location: [facility];  Service: Cardiovascular;  Laterality: N/A;  CFX  . KNEE SURGERY Left 2012  . LEFT HEART CATH AND CORONARY ANGIOGRAPHY N/A 02/15/2020   Procedure: LEFT HEART CATH AND CORONARY ANGIOGRAPHY;  Surgeon: Wellington Hampshire, MD;  Location: [facility];  Service: Cardiovascular;  Laterality: N/A;  . RIGHT HEART CATH N/A 02/15/2020   Procedure: RIGHT HEART CATH;  Surgeon: Wellington Hampshire, MD;  Location: [place];  Service: Cardiovascular;  Laterality: N/A;  . VENTRICULAR ASSIST DEVICE INSERTION N/A 02/15/2020   Procedure: VENTRICULAR ASSIST DEVICE INSERTION;  Surgeon: Wellington Hampshire, MD;  Location: [facility];  Service: Cardiovascular;  Laterality: N/A;  iMPELLA     There were no vitals filed for this  visit.   Subjective Assessment - 04/09/20 1236    Subjective  "I got back Wednesday for the wound on my head and they wanna look at it. It had an episode at my appt last week almost passed out."    Patient is accompanied by: Family member    Pertinent History PMH: HLD, HTN    Limitations fall risk. not currently driving. monitor blood pressure    Patient Stated Goals "build my strength" driving and back to work    Currently in Pain? Yes    Pain Score 5     Pain Location Hip    Pain Orientation Left    Pain Descriptors / Indicators Aching    Pain Type Chronic pain    Pain Onset More than a month ago    Pain Frequency Intermittent                  OCCUPATIONAL THERAPY DISCHARGE SUMMARY  Visits from Start of Care: 7  Current functional level related to goals / functional outcomes: Pt has met all STGs and LTGs. Pt is ready to discharge from OT at this time. Pt has made great progress with overall activity tolerance and multi tasking. Pt continues to have diffiulty with word finding but overall improvement with functional cognition.    Remaining deficits: Continued deficits with word finding and some cognition. Pt still not driving. Waiting on physician  approval.    Education / Equipment: HEPs  Plan: Patient agrees to discharge.  Patient goals were met. Patient is being discharged due to meeting the stated rehab goals.  ?????            OT Treatments/Exercises (OP) - 04/09/20 1253      Cognitive Exercises   Attention Span Alternating walking around while tossing small hacky sack with word finding geographical terms (cities, state, countries) with 82% accuracy                  OT Education - 04/09/20 1255    Education Details discharge instructions    Person(s) Educated Patient    Methods Explanation    Comprehension Verbalized understanding            OT Short Term Goals - 04/02/20 1459      OT SHORT TERM GOAL #1   Title Pt will be independent  with HEP 04/03/2020    Time 3    Period Weeks    Status Achieved    Target Date 04/03/20      OT SHORT TERM GOAL #2   Title Pt will complete environmental scanning in a moderate to max distracting environment with 100% accuracy    Baseline 75% accuracy    Time 3    Period Weeks    Status Achieved   100%     OT SHORT TERM GOAL #3   Title Pt will demonstrate improved activity tolerance by completing standing exercises for 15 minutes or greater with no break    Time 3    Period Weeks    Status Achieved   went grocery shopping and walking around the neighborhood with no issues     OT SHORT TERM GOAL #4   Title Continue to assess visual deficits and set goal PRN    Time 3    Period Weeks    Status Achieved   resolved            OT Long Term Goals - 04/09/20 1247      OT LONG TERM GOAL #1   Title Pt will be independent with updated HEP 04/24/20    Time 6    Period Weeks    Status Achieved      OT LONG TERM GOAL #2   Title Pt will perform physical and cognitive tasks simultaneously with 80% accuracy    Time 6    Period Weeks    Status Achieved   82% accuracy with 04/09/20     OT LONG TERM GOAL #3   Title Pt and caregiver will verbalize understanding for return to driving recommendations    Time 6    Period Weeks    Status Achieved      OT LONG TERM GOAL #4   Title Pt will improve activity tolerance by completing standing activities for 20 minutes or greater consecutively in order to return to playing with grandchildren.    Time 6    Period Weeks    Status Achieved   has tolerated grocery shopping and walking around neighborhood for > 30 minutes. continues to tire with tub transfers as he prefers getting in the bathtub for a bath and transfers are more challenging with knees.                Plan - 04/09/20 1238    Clinical Impression Statement Pt has met all STGs and LTGs. Pt is ready to discharge from OT at this  time. Pt has made great progress with overall  activity tolerance and multi tasking. Pt continues to have diffiulty with word finding but overall improvement with functional cognition.    OT Occupational Profile and History Problem Focused Assessment - Including review of records relating to presenting problem    Occupational performance deficits (Please refer to evaluation for details): IADL's;ADL's;Work;Leisure    Body Structure / Function / Physical Skills ADL;UE functional use;Decreased knowledge of use of DME;Balance;FMC;Vision;Dexterity;GMC;Strength;IADL;Endurance    Cognitive Skills Understand;Problem Solve;Learn;Memory    Rehab Potential Good    Clinical Decision Making Limited treatment options, no task modification necessary    Comorbidities Affecting Occupational Performance: None    Modification or Assistance to Complete Evaluation  No modification of tasks or assist necessary to complete eval    OT Frequency 2x / week    OT Duration 6 weeks   or 12 visits + evaluation   OT Treatment/Interventions Self-care/ADL training;Therapeutic exercise;Visual/perceptual remediation/compensation;Patient/family education;Neuromuscular education;Energy conservation;Therapist, nutritional;Therapeutic activities;DME and/or AE instruction;Cognitive remediation/compensation    Plan OT discharge    Consulted and Agree with Plan of Care Patient;Family member/caregiver    Family Member Consulted spouse, Glenard Haring           Patient will benefit from skilled therapeutic intervention in order to improve the following deficits and impairments:   Body Structure / Function / Physical Skills: ADL,UE functional use,Decreased knowledge of use of DME,Balance,FMC,Vision,Dexterity,GMC,Strength,IADL,Endurance Cognitive Skills: Justus Memory     Visit Diagnosis: 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 Ischemic cerebrovascular accident (CVA) of frontal lobe ([place]) 02/28/2020  . Cerebral thrombosis with cerebral infarction 02/18/2020  . Subarachnoid hemorrhage 02/18/2020  . Cardiogenic shock ([place]) 02/15/2020  . Cardiac arrest ([place])   . ST elevation myocardial infarction (STEMI) ([place])   . Encounter for imaging study to confirm orogastric (OG) tube placement   . Acute respiratory failure with hypoxia ([place])   . OSA (obstructive sleep apnea) 09/01/2016  . Hyperlipidemia 01/18/2012  . Transient global amnesia 01/17/2012    Zachery Conch MOT, OTR/L  04/09/2020, 1:05 PM  [facility] [address] [place], [place], [address] Phone: [phone]   Fax:  [phone]  Name: LUISANTONIO ADINOLFI MRN: [id number] Date of Birth: 11-24-1955

## 2020-04-10 ENCOUNTER — Ambulatory Visit: Payer: BC Managed Care – PPO | Admitting: Adult Health

## 2020-04-10 ENCOUNTER — Encounter: Payer: Self-pay | Admitting: Adult Health

## 2020-04-10 VITALS — BP 134/85 | HR 71 | Ht 71.0 in | Wt 199.0 lb

## 2020-04-10 DIAGNOSIS — I63423 Cerebral infarction due to embolism of bilateral anterior cerebral arteries: Secondary | ICD-10-CM | POA: Diagnosis not present

## 2020-04-10 NOTE — Progress Notes (Signed)
[facility] [address] [place]. [place] [address] [phone]       HOSPITAL FOLLOW UP NOTE  Mr. [name] Date of Birth:  1955-09-09 Medical Record Number:  [id number]   Reason for Referral:  hospital stroke follow up    SUBJECTIVE:   CHIEF COMPLAINT:  Chief Complaint  Patient presents with  . Follow-up    TR with wife (angel) PT is well, no complaints     HPI:   JeffreyNyshaun H Morse a 65 y.o.malewith history of hypertension, hyperlipidemia and OSAwho presented to [place] 02/15/20 with cardiac arrest withsuccessful PCIanddrug-eluting stent placement left circumflex complicated by hypotension and placed on Impella device which was removed 1/15.  On 1/16, found to have left-sided weakness.  MRI showed multiple small infarcts with frontal lobe, and right thalamic with micro-hemorrhage vs hemorrhagic transformation, likely due to recent PCI as well as embolization from distal clot on the Impella device. VAS [place] upper extremity showed acute superficial vein thrombosis involving the right cephalic vein.  Recommended DAPT with aspirin and Plavix.  LDL 135 (on simvastatin PTA and switch to Crestor 40 mg daily).  A1c 6.1.  On 1/22, found to have delirium.  CT head negative.  EEG negative for seizures.  Neurology reconsulted and recommended Seroquel and Depakote twice daily.  Delirium felt to be due to encephalopathy from multiple sources including medical condition as well as medication effect.  Residual stroke deficit of cognitive impairment and mild left hemiparesis.   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  As well as embolization from distal clot on the Impella device   CT head: Subcentimeter hyperdense focus within the right thalamic region, suspicious for a small acute bleed. MRI correlation is recommended. Mild cerebral atrophy.  MRI : Multiple small acute  infarcts within the frontal lobes. New right thalamic microhemorrhage, since 01/18/12.  MRA : Normal intracranial MRA.  Carotid Doppler: Bilateral mild thickening without hemodynamically significant stenosis.  Vertebral artery flow is antegrade on the left not visualized on the right.  2D Echo: EF50 -55%. No cardiac source of emboli identified.  VAS [place] upper extremity: Acute superficial vein thrombosis involving the right cephalic vein.  Sars Corona Virus 2- negative  LDL 135  HgbA1c 6.1  VTE prophylaxis - none  No antithrombotic prior to admission, now on aspirin 81 mg daily and clopidogrel 75 mg daily.   Patient will be counseled to be compliant withhisantithrombotic medications  Ongoing aggressive stroke risk factor management  Therapy recommendations: CIR  Disposition:  d/c to CIR 02/28/2020  Today, 04/10/2020, Jeffrey Morse is being seen for hospital follow-up accompanied by his wife.  He was discharged home from CIR on 03/09/2020.  He reports he has been doing well since discharge- per patient, denies residual deficits but per wife he continues to experience very mild left arm weakness.  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, maintenance (IT) units on rooftops -he is aware that you will likely not be able to return back to this type of work as a consists of climbing high heights and lifting heavy objects Denies new or worsening stroke/TIA symptoms  Compliant on aspirin and Plavix -denies side effects Compliant on Crestor 40 mg daily -denies side effects  Blood pressure today 134/85 -monitors at home and typically stable  He has had follow-up with cardiology as scheduled follow-up visit 04/30/2020  No new concerns at this  time    ROS:   14 system review of systems performed and negative with exception of those listed in HPI  PMH:  Past Medical History:  Diagnosis Date  . HTN (hypertension)   . Hypercholesteremia   . Hypertension    Borderline hypertension  . Sleep apnea   . Stroke [facility])     PSH:  Past Surgical History:  Procedure Laterality Date  . BACK SURGERY    . CARDIAC CATHETERIZATION    . CORONARY STENT INTERVENTION N/A 02/15/2020   Procedure: CORONARY STENT INTERVENTION;  Surgeon: Wellington Hampshire, MD;  Location: [facility];  Service: Cardiovascular;  Laterality: N/A;  CFX  . KNEE SURGERY Left 2012  . LEFT HEART CATH AND CORONARY ANGIOGRAPHY N/A 02/15/2020   Procedure: LEFT HEART CATH AND CORONARY ANGIOGRAPHY;  Surgeon: Wellington Hampshire, MD;  Location: [facility];  Service: Cardiovascular;  Laterality: N/A;  . RIGHT HEART CATH N/A 02/15/2020   Procedure: RIGHT HEART CATH;  Surgeon: Wellington Hampshire, MD;  Location: [place] [facility];  Service: Cardiovascular;  Laterality: N/A;  . VENTRICULAR ASSIST DEVICE INSERTION N/A 02/15/2020   Procedure: VENTRICULAR ASSIST DEVICE INSERTION;  Surgeon: Wellington Hampshire, MD;  Location: [facility];  Service: Cardiovascular;  Laterality: N/A;  iMPELLA     Social History:  Social History   Socioeconomic History  . Marital status: Married    Spouse name: Not on file  . Number of children: Not on file  . Years of education: Not on file  . Highest education level: Not on file  Occupational History  . Not on file  Tobacco Use  . Smoking status: Former Research scientist (life sciences)  . Smokeless tobacco: Never Used  . 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 Vaping Use: Never used  Substance and Sexual Activity  . Alcohol use: Not Currently  . Drug use: Never  . Sexual activity: Yes  Other Topics Concern  . Not on file  Social History Narrative   ** Merged History Encounter **       Social Determinants of Health    Financial Resource Strain: Not on file  Food Insecurity: Not on file  Transportation Needs: Not on file  Physical Activity: Not on file  Stress: Not on file  Social Connections: Not on file  Intimate Partner Violence: Not on file    Family History:  Family History  Problem Relation Age of Onset  . Colon cancer Mother   . Stroke Father     Medications:   Current Outpatient Medications on File Prior to Visit  Medication Sig Dispense Refill  . acetaminophen (TYLENOL) 325 MG tablet Take 2 tablets (650 mg total) by mouth every 4 (four) hours as needed for mild pain (or temp > 37.5 C (99.5 F)).    . Aspirin 81 MG CAPS 1 capsule    . blood glucose meter kit and supplies Dispense based on patient and insurance preference. Use up to four times daily as directed. (FOR ICD-10 E10.9, E11.9). 1 each 0  . carvedilol (COREG) 3.125 MG tablet Take 1 tablet (3.125 mg total) by mouth 2 (two) times daily with a meal. 60 tablet 11  . clopidogrel (PLAVIX) 75 MG tablet Take 1 tablet (75 mg total) by mouth daily. 30 tablet  11  . empagliflozin (JARDIANCE) 10 MG TABS tablet Take 1 tablet (10 mg total) by mouth daily. 30 tablet 11  . escitalopram (LEXAPRO) 20 MG tablet Take 20 mg by mouth daily.    . Multiple Vitamin (MULTI VITAMIN) TABS     . omeprazole (PRILOSEC) 20 MG capsule Take 1 capsule (20 mg total) by mouth daily. 30 capsule 0  . rosuvastatin (CRESTOR) 40 MG tablet Take 1 tablet (40 mg total) by mouth daily. 30 tablet 11  . spironolactone (ALDACTONE) 25 MG tablet Take 0.5 tablets (12.5 mg total) by mouth daily. 15 tablet 11   No current facility-administered medications on file prior to visit.    Allergies:   Allergies  Allergen Reactions  . Atorvastatin     Other reaction(s): back pain (10/2017)  . Rosuvastatin Calcium     Other reaction(s): back pain (01/2018)      OBJECTIVE:  Physical Exam  Vitals:   04/10/20 1109  BP: 134/85  Pulse: 71  Weight: 199 lb (90.3 kg)  Height: 5'  11" (1.803 m)   Body mass index is 27.75 kg/m. No exam data present  Post stroke PHQ 2/9 Depression screen PHQ 2/9 03/20/2020  Decreased Interest 0  Down, Depressed, Hopeless 0  PHQ - 2 Score 0  Altered sleeping 0  Tired, decreased energy 3  Change in appetite 0  Feeling bad or failure about yourself  0  Trouble concentrating 0  Moving slowly or fidgety/restless 0  PHQ-9 Score 3     General: well developed, well nourished,  very pleasant middle-age Caucasian male, seated, in no evident distress Head: head normocephalic and atraumatic.   Neck: supple with no carotid or supraclavicular bruits Cardiovascular: regular rate and rhythm, no murmurs Musculoskeletal: no deformity Skin:  no rash/petichiae Vascular:  Normal pulses all extremities   Neurologic Exam Mental Status: Awake and fully alert.   Fluent speech and language.  Oriented to place and time. Recent and remote memory intact. Attention span, concentration and fund of knowledge appropriate. Mood and affect appropriate.  Cranial Nerves: Fundoscopic exam reveals sharp disc margins. Pupils equal, briskly reactive to light. Extraocular movements full without nystagmus. Visual fields full to confrontation. Hearing intact. Facial sensation intact. Face, tongue, palate moves normally and symmetrically.  Motor: Normal bulk and tone. Normal strength in all tested extremity muscles Sensory.: intact to touch , pinprick , position and vibratory sensation.  Coordination: Rapid alternating movements normal in all extremities. Finger-to-nose and heel-to-shin performed accurately bilaterally. Gait and Station: Arises from chair without difficulty. Stance is normal. Gait demonstrates normal stride length and balance with without use of assistive device.  Able to tandem walk and heel toe without difficulty.  Reflexes: 1+ and symmetric. Toes downgoing.     NIHSS  0 Modified Rankin  2      ASSESSMENT: Jeffrey Morse is a 65 y.o. year  old male admitted on 02/15/2020 for cardiac arrest x2 s/p CPR with finding of STEMI s/p PCI and DES. On 1/16, evidence of left-sided weakness with MRI showing right thalamic microhemorrhage vs hemorrhagic transformation and several punctate infarcts bilateral frontal area, embolic likely secondary to her seizures related CPR and PCI/stenting. Vascular risk factors include HTN, HLD, and OSA.  No prior cardiac history until recent admission     PLAN:  1. R thalamic, b/l frontal infarcts :  a. Unable to appreciate residual deficits on exam -continue to work with PT for mild gait unsteadiness.  He was cleared to return back to  driving as I do not believe is minimal residual deficit would interfere with his driving work because concerns of safety.  He was also cleared to slowly return back to prior activities as tolerated.  He and his wife verbalized understanding that they will need further cardiac clearance prior to return to driving or prior activities -they plan on discussing further with cardiology  b. continue aspirin 81 mg daily and clopidogrel 75 mg daily  and Crestor 40 mg daily for secondary stroke prevention.   c. Discussed secondary stroke prevention measures and importance of close PCP follow up for aggressive stroke risk factor management  2. HTN: BP goal <130/90.  Stable on current regimen per cardiology 3. HLD: LDL goal <70. Recent LDL 135 - changed simvastatin to Crestor during admission. Has f/u with PCP in April with plans on repeat lab work 4. DMII, new dx: A1c goal<7.0. Recent A1c 6.1 currently on Jardiance.  5. STEMI s/p PCI/DES: Routine follow-up with cardiology    Follow up in 6 months or call earlier if needed   CC:  [place] provider: Dr. Leonie Man PCP: Gaynelle Arabian, MD    I spent 50 minutes of face-to-face and non-face-to-face time with patient and wife.  This included previsit chart review including recent hospitalization pertinent progress notes, lab work and imaging, lab  review, study review, order entry, electronic health record documentation, patient and wife education and discussion regarding recent stroke including likely etiology, minimal residual deficits, importance of managing stroke risk factors, review of MRI with patient and wife, and answered all other questions to patient and wife's satisfaction   Frann Rider, AGNP-BC  [facility] [address] [place], [place] [address]  Phone [phone] Fax [phone] Note: This document was prepared with digital dictation and possible smart phrase technology. Any transcriptional errors that result from this process are unintentional.

## 2020-04-10 NOTE — Patient Instructions (Signed)
Continue aspirin 81 mg daily and clopidogrel 75 mg daily  and Crestor 40mg  daily  for secondary stroke prevention  Cleared to return back to driving and activities without restrictions but please ensure you are also cleared by cardiology  Routine regular follow-up with cardiology as advised  Continue to follow up with PCP regarding cholesterol, blood pressure and diabetes management  Maintain strict control of hypertension with blood pressure goal below 130/90, diabetes with hemoglobin A1c goal below 7% and cholesterol with LDL cholesterol (bad cholesterol) goal below 70 mg/dL.       Followup in the future with me in 6 months or call earlier if needed       Thank you for coming to see at [facility]. I hope we have been able to provide you high quality care today.  You may receive a patient satisfaction survey over the next few weeks. We would appreciate your feedback and comments so that we may continue to improve ourselves and the health of our patients.    Stroke Prevention Some medical conditions and behaviors are associated with a higher chance of having a stroke. You can help prevent a stroke by making nutrition, lifestyle, and other changes, including managing any medical conditions you may have. What nutrition changes can be made?  Eat healthy foods. You can do this by: ? Choosing foods high in fiber, such as fresh fruits and vegetables and whole grains. ? Eating at least 5 or more servings of fruits and vegetables a day. Try to fill half of your plate at each meal with fruits and vegetables. ? Choosing lean protein foods, such as lean cuts of meat, poultry without skin, fish, tofu, beans, and nuts. ? Eating low-fat dairy products. ? Avoiding foods that are high in salt (sodium). This can help lower blood pressure. ? Avoiding foods that have saturated fat, trans fat, and cholesterol. This can help prevent high cholesterol. ? Avoiding processed and  premade foods.  Follow your health care provider's specific guidelines for losing weight, controlling high blood pressure (hypertension), lowering high cholesterol, and managing diabetes. These may include: ? Reducing your daily calorie intake. ? Limiting your daily sodium intake to 1,500 milligrams (mg). ? Using only healthy fats for cooking, such as olive oil, canola oil, or sunflower oil. ? Counting your daily carbohydrate intake.   What lifestyle changes can be made?  Maintain a healthy weight. Talk to your health care provider about your ideal weight.  Get at least 30 minutes of moderate physical activity at least 5 days a week. Moderate activity includes brisk walking, biking, and swimming.  Do not use any products that contain nicotine or tobacco, such as cigarettes and e-cigarettes. If you need help quitting, ask your health care provider. It may also be helpful to avoid exposure to secondhand smoke.  Limit alcohol intake to no more than 1 drink a day for nonpregnant women and 2 drinks a day for men. One drink equals 12 oz of beer, 5 oz of wine, or 1 oz of hard liquor.  Stop any illegal drug use.  Avoid taking birth control pills. Talk to your health care provider about the risks of taking birth control pills if: ? You are over 19 years old. ? You smoke. ? You get migraines. ? You have ever had a blood clot. What other changes can be made?  Manage your cholesterol levels. ? Eating a healthy diet is important for preventing high cholesterol. If cholesterol cannot be managed  through diet alone, you may also need to take medicines. ? Take any prescribed medicines to control your cholesterol as told by your health care provider.  Manage your diabetes. ? Eating a healthy diet and exercising regularly are important parts of managing your blood sugar. If your blood sugar cannot be managed through diet and exercise, you may need to take medicines. ? Take any prescribed medicines to  control your diabetes as told by your health care provider.  Control your hypertension. ? To reduce your risk of stroke, try to keep your blood pressure below 130/80. ? Eating a healthy diet and exercising regularly are an important part of controlling your blood pressure. If your blood pressure cannot be managed through diet and exercise, you may need to take medicines. ? Take any prescribed medicines to control hypertension as told by your health care provider. ? Ask your health care provider if you should monitor your blood pressure at home. ? Have your blood pressure checked every year, even if your blood pressure is normal. Blood pressure increases with age and some medical conditions.  Get evaluated for sleep disorders (sleep apnea). Talk to your health care provider about getting a sleep evaluation if you snore a lot or have excessive sleepiness.  Take over-the-counter and prescription medicines only as told by your health care provider. Aspirin or blood thinners (antiplatelets or anticoagulants) may be recommended to reduce your risk of forming blood clots that can lead to stroke.  Make sure that any other medical conditions you have, such as atrial fibrillation or atherosclerosis, are managed. What are the warning signs of a stroke? The warning signs of a stroke can be easily remembered as BEFAST.  B is for balance. Signs include: ? Dizziness. ? Loss of balance or coordination. ? Sudden trouble walking.  E is for eyes. Signs include: ? A sudden change in vision. ? Trouble seeing.  F is for face. Signs include: ? Sudden weakness or numbness of the face. ? The face or eyelid drooping to one side.  A is for arms. Signs include: ? Sudden weakness or numbness of the arm, usually on one side of the body.  S is for speech. Signs include: ? Trouble speaking (aphasia). ? Trouble understanding.  T is for time. ? These symptoms may represent a serious problem that is an emergency.  Do not wait to see if the symptoms will go away. Get medical help right away. Call your local emergency services (911 in the [place] Do not drive yourself to the hospital.  Other signs of stroke may include: ? A sudden, severe headache with no known cause. ? Nausea or vomiting. ? Seizure. Where to find more information For more information, visit:  American Stroke Association: www.strokeassociation.org  National Stroke Association: www.stroke.org Summary  You can prevent a stroke by eating healthy, exercising, not smoking, limiting alcohol intake, and managing any medical conditions you may have.  Do not use any products that contain nicotine or tobacco, such as cigarettes and e-cigarettes. If you need help quitting, ask your health care provider. It may also be helpful to avoid exposure to secondhand smoke.  Remember BEFAST for warning signs of stroke. Get help right away if you or a loved one has any of these signs. This information is not intended to replace advice given to you by your health care provider. Make sure you discuss any questions you have with your health care provider. Document Revised: 01/02/2017 Document Reviewed: 02/26/2016 Elsevier Patient Education  2021  [employer].

## 2020-04-11 ENCOUNTER — Ambulatory Visit: Payer: BC Managed Care – PPO | Admitting: Physical Therapy

## 2020-04-11 ENCOUNTER — Encounter: Payer: BC Managed Care – PPO | Admitting: Physician Assistant

## 2020-04-11 ENCOUNTER — Other Ambulatory Visit: Payer: Self-pay

## 2020-04-11 ENCOUNTER — Ambulatory Visit: Payer: BC Managed Care – PPO | Admitting: Occupational Therapy

## 2020-04-11 ENCOUNTER — Encounter: Payer: Self-pay | Admitting: Physical Therapy

## 2020-04-11 DIAGNOSIS — R2689 Other abnormalities of gait and mobility: Secondary | ICD-10-CM

## 2020-04-11 DIAGNOSIS — E11622 Type 2 diabetes mellitus with other skin ulcer: Secondary | ICD-10-CM | POA: Diagnosis not present

## 2020-04-11 DIAGNOSIS — I69854 Hemiplegia and hemiparesis following other cerebrovascular disease affecting left non-dominant side: Secondary | ICD-10-CM | POA: Diagnosis not present

## 2020-04-11 DIAGNOSIS — M6281 Muscle weakness (generalized): Secondary | ICD-10-CM

## 2020-04-11 NOTE — Progress Notes (Addendum)
CLEVER, GERALDO ([id number]) Visit Report for 04/11/2020 Chief Complaint Document Details Patient Name: Jeffrey Morse, Jeffrey Morse Date of Service: 04/11/2020 8:45 AM Medical Record Number: [id number] Patient Account Number: [id number] Date of Birth/Sex: 25-Mar-1955 (64 y.o. M) Treating RN: Huel Coventry Primary Care Provider: Blair Heys Other Clinician: Lolita Cram Referring Provider: Blair Heys Treating Provider/Extender: Rowan Blase in Treatment: 1 Information Obtained from: Patient Chief Complaint 04/04/2020; patient arrives for review of wound on his occiput of his scalp Electronic Signature(s) Signed: 04/11/2020 8:51:51 AM By: Lenda Kelp PA-C Entered By: Lenda Kelp on 04/11/2020 08:51:51 Jeffrey Morse ([id number]) -------------------------------------------------------------------------------- HPI Details Patient Name: Jeffrey Morse Date of Service: 04/11/2020 8:45 AM Medical Record Number: [id number] Patient Account Number: [id number] Date of Birth/Sex: 04/26/1955 (64 y.o. M) Treating RN: Huel Coventry Primary Care Provider: Blair Heys Other Clinician: Lolita Cram Referring Provider: Blair Heys Treating Provider/Extender: Rowan Blase in Treatment: 1 History of Present Illness HPI Description: ADMISSION 04/04/2020 This is a 65 year old man who presented in January to an urgent care with acute coronary syndrome. He had a cardiac arrest requiring cardioversion and then had further arrest requiring cardioversion on the way to and in the hospital and apparently in the Cath Lab. He had an acute inferior lateral MI. I think somewhere in this he had a right thalamic frontal bleed as well and spent some time in rehab. During the stay in rehab it was discovered that he had a black eschar on his occiput. The nurse told him that she thought it might be a pressure wound. The patient's wife has since remove the eschar since he arrived home. He has a large  hyper granulated area over the occiput. He also has an extensive patch of alopecia around this area. No other scalp skin lesions or areas are seen. He arrives with several questions about whether recurrent cardioversion like he suffered could cause a burn injury or perhaps the CT scan. He has been applying iodine and Neosporin Past medical history includes type 2 diabetes, coronary artery disease, lumbar spinal stenosis, hypertension, hypercholesterolemia. He is on Plavix. 04/11/2020 on evaluation today patient appears to be doing well with regard to his wound on the scalp. With that being said this is hypergranular. I did review the surgical pathology that was negative for any type of malignancy there was just inflamed granulation tissue. Fortunately there does not appear to be any evidence of active infection at this time which is Publishing rights manager) Signed: 04/11/2020 9:24:37 AM By: Lenda Kelp PA-C Entered By: Lenda Kelp on 04/11/2020 09:24:36 Jeffrey Morse ([id number]) -------------------------------------------------------------------------------- Gaynelle Adu TISS Details Patient Name: Jeffrey Morse Date of Service: 04/11/2020 8:45 AM Medical Record Number: [id number] Patient Account Number: [id number] Date of Birth/Sex: 05-May-1955 (64 y.o. M) Treating RN: Huel Coventry Primary Care Provider: Blair Heys Other Clinician: Lolita Cram Referring Provider: Blair Heys Treating Provider/Extender: Rowan Blase in Treatment: 1 Procedure Performed for: Wound #1 Head - occiput Performed By: Physician Nelida Meuse., PA-C Post Procedure Diagnosis Same as Pre-procedure Notes 2 silver nitrate sticks used. Electronic Signature(s) Signed: 04/11/2020 5:32:24 PM By: Elliot Gurney, BSN, RN, CWS, Kim RN, BSN Entered By: Elliot Gurney, BSN, RN, CWS, Kim on 04/11/2020 09:07:36 Jeffrey Morse  ([id number]) -------------------------------------------------------------------------------- Physical Exam Details Patient Name: Jeffrey Morse Date of Service: 04/11/2020 8:45 AM Medical Record Number: [id number] Patient Account Number: [id number] Date of Birth/Sex: [birth year] (64 y.o. M) Treating RN: Huel Coventry Primary Care Provider: Blair Heys Other Clinician:  Lolita Cram Referring Provider: Blair Heys Treating Provider/Extender: Rowan Blase in Treatment: 1 Constitutional Well-nourished and well-hydrated in no acute distress. Respiratory normal breathing without difficulty. Psychiatric this patient is able to make decisions and demonstrates good insight into disease process. Alert and Oriented x 3. pleasant and cooperative. Notes Patient's wound bed actually showed signs of hyper granulation. There does not appear to be signs of infection and overall I think that if we can manage hypergranular tissue this will actually do quite well. I am getting go ahead and perform chemical cauterization with silver nitrate today using 2 sticks of silver nitrate and the patient tolerated that without any pain whatsoever. Electronic Signature(s) Signed: 04/11/2020 9:25:02 AM By: Lenda Kelp PA-C Entered By: Lenda Kelp on 04/11/2020 09:25:02 Jeffrey Morse, Jeffrey Morse ([id number]) -------------------------------------------------------------------------------- Physician Orders Details Patient Name: Jeffrey Morse Date of Service: 04/11/2020 8:45 AM Medical Record Number: [id number] Patient Account Number: [id number] Date of Birth/Sex: 1955-11-01 (64 y.o. M) Treating RN: Huel Coventry Primary Care Provider: Blair Heys Other Clinician: Lolita Cram Referring Provider: Blair Heys Treating Provider/Extender: Rowan Blase in Treatment: 1 Verbal / Phone Orders: No Diagnosis Coding ICD-10 Coding Code Description L98.498 Non-pressure chronic ulcer of skin of  other sites with other specified severity S00.01XD Abrasion of scalp, subsequent encounter Follow-up Appointments o Return Appointment in 1 week. Bathing/ Shower/ Hygiene o May shower; gently cleanse wound with antibacterial soap, rinse and pat dry prior to dressing wounds Anesthetic (Use 'Patient Medications' Section for Anesthetic Order Entry) o Lidocaine applied to wound bed Wound Treatment Wound #1 - Head - occiput Primary Dressing: Silvercel Small 2x2 (in/in) Discharge Instructions: Apply Silvercel Small 2x2 (in/in) as instructed Secondary Dressing: Gauze Discharge Instructions: As directed: dry, moistened with saline or moistened with Dakins Solution Secured With: Tegaderm Film Transparent 4x4.75 (in/in) Discharge Instructions: Apply to wound bed Electronic Signature(s) Signed: 04/11/2020 5:32:24 PM By: Elliot Gurney, BSN, RN, CWS, Kim RN, BSN Signed: 04/11/2020 6:23:15 PM By: Lenda Kelp PA-C Entered By: Elliot Gurney, BSN, RN, CWS, Kim on 04/11/2020 09:08:57 Jeffrey Morse ([id number]) -------------------------------------------------------------------------------- Problem List Details Patient Name: Jeffrey Morse Date of Service: 04/11/2020 8:45 AM Medical Record Number: [id number] Patient Account Number: [id number] Date of Birth/Sex: May 23, 1955 (64 y.o. 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 M) Treating RN: Huel Coventry Primary Care Provider: Blair Heys Other Clinician: Lolita Cram Referring Provider: Blair Heys Treating Provider/Extender: Rowan Blase in Treatment: 1 Subjective Chief Complaint Information obtained from Patient 04/04/2020; patient arrives for review of wound on his occiput of his scalp History of Present Illness (HPI) ADMISSION 04/04/2020 This is a 65 year old man who presented in January to an urgent care with acute coronary syndrome. He had a cardiac arrest requiring cardioversion and then had further arrest requiring cardioversion on the way to and in the hospital and apparently in the Cath Lab.  He had an acute inferior lateral MI. I think somewhere in this he had a right thalamic frontal bleed as well and spent some time in rehab. During the stay in rehab it was discovered that he had a black eschar on his occiput. The nurse told him that she thought it might be a pressure wound. The patient's wife has since remove the eschar since he arrived home. He has a large hyper granulated area over the occiput. He also has an extensive patch of alopecia around this area. No other scalp skin lesions or areas are seen. He arrives with several questions about whether recurrent cardioversion like he suffered could cause a burn injury or perhaps the CT scan. He has been applying iodine and Neosporin Past medical history includes type 2 diabetes, coronary artery disease, lumbar spinal stenosis, hypertension, hypercholesterolemia. He is on Plavix. 04/11/2020 on evaluation today patient appears to be doing well with regard to his wound on the scalp. With that being said this is  hypergranular. I did review the surgical pathology that was negative for any type of malignancy there was just inflamed granulation tissue. Fortunately there does not appear to be any evidence of active infection at this time which is wonderful Objective Constitutional Well-nourished and well-hydrated in no acute distress. Vitals Time Taken: 8:49 AM, Height: 71 in, Weight: 198 lbs, BMI: 27.6, Temperature: 98.1 F, Pulse: 76 bpm, Respiratory Rate: 20 breaths/min, Blood Pressure: 136/92 mmHg. Respiratory normal breathing without difficulty. Psychiatric this patient is able to make decisions and demonstrates good insight into disease process. Alert and Oriented x 3. pleasant and cooperative. General Notes: Patient's wound bed actually showed signs of hyper granulation. There does not appear to be signs of infection and overall I think that if we can manage hypergranular tissue this will actually do quite well. I am getting go ahead and perform chemical cauterization with silver nitrate today using 2 sticks of silver nitrate and the patient tolerated that without any pain whatsoever. Integumentary (Hair, Skin) Wound #1 status is Open. Original cause of wound was Pressure Injury. The date acquired was: 03/06/2020. The wound has been in treatment 1 weeks. The wound is located on the Head - occiput. The wound measures 3.2cm length x 2.9cm width x 0.1cm depth; 7.288cm^2 area and 0.729cm^3 volume. There is Fat Layer (Subcutaneous Tissue) exposed. There is no tunneling or undermining noted. There is a medium amount of serosanguineous drainage noted. The wound margin is flat and intact. There is large (67-100%) red granulation within the wound bed. There is a small (1-33%) amount of necrotic tissue within the wound bed including Adherent Slough. Jeffrey Morse, Jeffrey Morse ([id number]) Assessment Active Problems ICD-10 Non-pressure chronic ulcer of skin of other sites with other specified severity Abrasion of  scalp, subsequent encounter Procedures Wound #1 Pre-procedure diagnosis of Wound #1 is an Atypical located on the Head - occiput . An CHEM CAUT GRANULATION TISS procedure was performed by Nelida Meuse., PA-C. Post procedure Diagnosis Wound #1: Same as Pre-Procedure Notes: 2 silver nitrate sticks used. Plan Follow-up Appointments: Return Appointment in 1 week. Bathing/ Shower/ Hygiene: May shower; gently cleanse wound with antibacterial soap, rinse and pat dry prior to dressing wounds Anesthetic (Use 'Patient Medications' Section for Anesthetic Order Entry): Lidocaine applied to wound bed WOUND #1: - Head - occiput Wound Laterality: Primary Dressing: Silvercel Small 2x2 (in/in) Discharge Instructions: Apply Silvercel Small 2x2 (in/in) as instructed Secondary Dressing: Gauze Discharge Instructions: As directed: dry, moistened with saline or moistened with Dakins Solution  Secured With: Tegaderm Film Transparent 4x4.75 (in/in) Discharge Instructions: Apply to wound bed 1. Would recommend currently that we going continue with the wound care measures as as before utilizing the silver alginate dressing. 2. I am also can recommend at this time that we have the patient continue to cover this with Tegaderm film to secure in place. 3. I am also can recommend the patient continue to monitor for any signs of infection. Obviously right now I do not see anything we will keep a close eye on this. 4. We may have to perform some debridement to clear away some of the hypergranular tissue in the future if this does not work just with the silver nitrate but I am hopeful that this will flatten out some of the hypergranulation. We will see patient back for reevaluation in 1 week here in the clinic. If anything worsens or changes patient will contact our office for additional recommendations. Electronic Signature(s) Signed: 04/11/2020 9:26:03 AM By: Lenda Kelp PA-C Entered By: Lenda Kelp on  04/11/2020 09:26:02 SHAWN, CARATTINI ([id number]) -------------------------------------------------------------------------------- SuperBill Details Patient Name: Jeffrey Morse Date of Service: 04/11/2020 Medical Record Number: [id number] Patient Account Number: [id number] Date of Birth/Sex: 1955/10/20 (64 y.o. 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

## 2020-04-11 NOTE — Therapy (Signed)
[facility] [address] [place], [place], [address] Phone: [phone]   Fax:  [phone]  Physical Therapy Treatment/Discharge Summary  Patient Details  Name: LIRON EISSLER MRN: [id number] Date of Birth: 1955/03/19 Referring Provider (PT): Cathlyn Parsons, PA-C   Encounter Date: 04/11/2020   PT End of Session - 04/11/20 1135    Visit Number 8    Number of Visits 13    Date for PT Re-Evaluation 05/12/20   POC for 6 weeks, Cert for 60 days   Authorization Type BCBS    PT Start Time 1102    PT Stop Time 1130   full time not used due to D/C   PT Time Calculation (min) 28 min    Equipment Utilized During Treatment Gait belt    Activity Tolerance Patient tolerated treatment well    Behavior During Therapy [facility] for tasks assessed/performed           Past Medical History:  Diagnosis Date  . HTN (hypertension)   . Hypercholesteremia   . Hypertension    Borderline hypertension  . Sleep apnea   . Stroke [facility])     Past Surgical History:  Procedure Laterality Date  . BACK SURGERY    . CARDIAC CATHETERIZATION    . CORONARY STENT INTERVENTION N/A 02/15/2020   Procedure: CORONARY STENT INTERVENTION;  Surgeon: Wellington Hampshire, MD;  Location: [facility];  Service: Cardiovascular;  Laterality: N/A;  CFX  . KNEE SURGERY Left 2012  . LEFT HEART CATH AND CORONARY ANGIOGRAPHY N/A 02/15/2020   Procedure: LEFT HEART CATH AND CORONARY ANGIOGRAPHY;  Surgeon: Wellington Hampshire, MD;  Location: [place] [place] [facility];  Service: Cardiovascular;  Laterality: N/A;  . RIGHT HEART CATH N/A 02/15/2020   Procedure: RIGHT HEART CATH;  Surgeon: Wellington Hampshire, MD;  Location: [place];  Service: Cardiovascular;  Laterality: N/A;  . VENTRICULAR ASSIST DEVICE INSERTION N/A 02/15/2020   Procedure: VENTRICULAR ASSIST DEVICE INSERTION;  Surgeon: Wellington Hampshire, MD;  Location: [facility];  Service: Cardiovascular;   Laterality: N/A;  iMPELLA     There were no vitals filed for this visit.   Subjective Assessment - 04/11/20 1104    Subjective Saw the neurologist yesterday, is now cleared for driving. Got the results of his skin biopsy back, no skin cancer. Haven't had the chance to try the new exercises yet.    Patient is accompained by: Family member   Wife Youth worker)   Pertinent History HTN, Hyperlipidemia    Limitations Walking;Standing    Diagnostic tests CT/MRI of the brain showed multiple small acute infarcts within the frontal lobes. Echocardiogram revealed severely reduced LV systolic function no evidence of pericardial effusion.  EKG after CPR showed evidence of inferior ST elevation with lateral involvement suggestive of left circumflex occlusion    Patient Stated Goals Build the strength    Currently in Pain? Yes    Pain Score 3     Pain Location Back    Pain Orientation Left;Mid    Pain Descriptors / Indicators Aching    Pain Type Chronic pain    Pain Onset More than a month ago    Pain Relieving Factors stretching, lying down    Pain Onset More than a month ago              [facility] PT Assessment - 04/11/20 1107      Ambulation/Gait   Ambulation/Gait Yes  Assistive device None    Gait Pattern Step-through pattern    Ambulation Surface Level;Indoor    Gait velocity 9.06 seconds = 3.6 ft/sec      Functional Gait  Assessment   Gait assessed  Yes    Gait Level Surface Walks 20 ft in less than 5.5 sec, no assistive devices, good speed, no evidence for imbalance, normal gait pattern, deviates no more than 6 in outside of the 12 in walkway width.    Change in Gait Speed Able to smoothly change walking speed without loss of balance or gait deviation. Deviate no more than 6 in outside of the 12 in walkway width.    Gait with Horizontal Head Turns Performs head turns smoothly with no change in gait. Deviates no more than 6 in outside 12 in walkway width    Gait with Vertical Head Turns  Performs head turns with no change in gait. Deviates no more than 6 in outside 12 in walkway width.    Gait and Pivot Turn Pivot turns safely within 3 sec and stops quickly with no loss of balance.    Step Over Obstacle Is able to step over 2 stacked shoe boxes taped together (9 in total height) without changing gait speed. No evidence of imbalance.    Gait with Narrow Base of Support Is able to ambulate for 10 steps heel to toe with no staggering.    Gait with Eyes Closed Walks 20 ft, uses assistive device, slower speed, mild gait deviations, deviates 6-10 in outside 12 in walkway width. Ambulates 20 ft in less than 9 sec but greater than 7 sec.    Ambulating Backwards Walks 20 ft, no assistive devices, good speed, no evidence for imbalance, normal gait    Steps Alternating feet, no rail.    Total Score 29    FGA comment: 29/30                         [facility] Adult PT Treatment/Exercise - 04/11/20 1107      Ambulation/Gait   Ambulation/Gait Assistance 6: Modified independent (Device/Increase time)    Ambulation/Gait Assistance Details outdoors over paved surfaces, unable to perform in grass due to wet conditions    Ambulation Distance (Feet) 1000 Feet    Stairs Yes    Stairs Assistance 5: Supervision    Stair Management Technique No rails;Alternating pattern;Forwards    Number of Stairs 8    Height of Stairs 6                  PT Education - 04/11/20 1133    Education Details progress towards goals, D/C from PT, making sure pt as night lights if pt has to get up at night/yard is well lit at night due to difficulty with balance with eyes closed    Person(s) Educated Patient    Methods Explanation    Comprehension Verbalized understanding            PT Short Term Goals - 04/02/20 1331      PT SHORT TERM GOAL #1   Title Patient will be independent with initial HEP for balance/strengthening (ALL STGs Due: 04/03/2020)    Baseline pt reports independent with  initial HEP    Time 3    Period Weeks    Status Achieved    Target Date 04/03/20      PT SHORT TERM GOAL #2   Title FGA to be assessed with LTG to  be set as appropriate    Baseline 26/30 - LTG written as appropriate.    Time 3    Period Weeks    Status Achieved      PT SHORT TERM GOAL #3   Title Patient will improve 5x sit <> stand to </= 20 seconds without UE support to demo improved balance    Baseline 23.52 secs, 13.97 seconds on 04/02/20    Time 3    Period Weeks    Status Achieved             PT Long Term Goals - 04/11/20 1106      PT LONG TERM GOAL #1   Title Patient will be independent with Final HEP for balance/strengthening (All LTGs Due: 04/24/20)    Baseline upgraded last session, verbally reviewed today.    Time 6    Period Weeks    Status Achieved      PT LONG TERM GOAL #2   Title Pt will improve FGA score to at least a 29/30 in order to demo decr fall risk.    Baseline 26/30 on 03/19/20, 29/30 on 04/11/20    Time 6    Period Weeks    Status Achieved      PT LONG TERM GOAL #3   Title Patient will improve 5x sit <> stand to </= 15 seconds to demonstrate improved balance and reduced fall risk    Baseline 23.52 secs w/o UE support, 13.97 seconds on 04/02/20    Time 6    Period Weeks    Status Achieved      PT LONG TERM GOAL #4   Title Patient will be able to ascend/descend 8 stairs with reciprocal pattern and no rails to demonstrate improved ability to enter/exit home    Baseline 4 steps with recirpocal B rails, 8 steps reciprocal pattern with no railings on 04/11/20    Time 6    Period Weeks    Status Achieved      PT LONG TERM GOAL #5   Title Patient willl demo ability to ambulate >/= 1000 ft on outdoor various surfaces without AD and Mod I    Baseline 115 ft supervision indoors, outdoors on paved surfaces with mod I on 04/11/20 - unable to do grass due to extreme conditions (pt reports no issues at home)    Time 6    Period Weeks    Status Achieved             PHYSICAL THERAPY DISCHARGE SUMMARY  Visits from Start of Care: 8  Current functional level related to goals / functional outcomes: See LTGs.    Remaining deficits: Impaired balance with eyes closed.    Education / Equipment: HEP   Plan: Patient agrees to discharge.  Patient goals were met. Patient is being discharged due to meeting the stated rehab goals.  ?????         Plan - 04/11/20 1141    Clinical Impression Statement Assessed pt's LTGs with pt meeting 5 out of 5. Pt improved FGA score to a 29/30, with pt no longer being at a risk for falls. Pt continues to have mild difficulty with balance with eyes closed. Pt able to ambulate outdoors over paved surfaces with mod I over 1,000' (did not perform on grass due to wet conditions). Pt is very pleased with his progress and is agreeable to D/C at this time. Reviewed finalized HEP, with pt with no questions.    Personal Factors and  Comorbidities Comorbidity 1    Comorbidities HTN, Hyperlipidemia    Examination-Activity Limitations Stairs;Stand;Squat;Locomotion Level    Examination-Participation Restrictions Occupation;Yard Work;Community Activity;Driving    Stability/Clinical Decision Making Stable/Uncomplicated    Rehab Potential Good    PT Frequency 2x / week    PT Duration 6 weeks    PT Treatment/Interventions ADLs/Self Care Home Management;Cryotherapy;Moist Heat;DME Instruction;Gait training;Stair training;Functional mobility training;Therapeutic activities;Therapeutic exercise;Balance training;Neuromuscular re-education;Patient/family education;Orthotic Fit/Training;Manual techniques;Passive range of motion;Vestibular    PT Next Visit Plan D/C    PT Home Exercise Plan [email or web address]    Consulted and Agree with Plan of Care Patient;Family member/caregiver    Family Member Consulted Wife Glenard Haring)           Patient will benefit from skilled therapeutic intervention in order to improve the following deficits and  impairments:  Abnormal gait,Decreased balance,Impaired vision/preception,Difficulty walking,Decreased endurance,Pain,Decreased strength,Decreased safety awareness,Decreased activity tolerance,Impaired perceived functional ability  Visit Diagnosis: Muscle weakness (generalized)  Other abnormalities of gait and mobility     Problem List Patient Active Problem List   Diagnosis Date Noted  . Ischemic cerebrovascular accident (CVA) of frontal lobe ([place]) 02/28/2020  . Cerebral thrombosis with cerebral infarction 02/18/2020  . Subarachnoid hemorrhage 02/18/2020  . Cardiogenic shock ([place]) 02/15/2020  . Cardiac arrest ([place])   . ST elevation myocardial infarction (STEMI) ([place])   . Encounter for imaging study to confirm orogastric (OG) tube placement   . Acute respiratory failure with hypoxia ([place])   . OSA (obstructive sleep apnea) 09/01/2016  . Hyperlipidemia 01/18/2012  . Transient global amnesia 01/17/2012    Arliss Journey, PT, DPT  04/11/2020, 11:41 AM  [facility] [address] [place], [place], [address] Phone: [phone]   Fax:  [phone]  Name: TRISTON SKARE MRN: [id number] Date of Birth: 03-Jul-1955

## 2020-04-11 NOTE — Progress Notes (Signed)
Jeffrey Morse, Jeffrey Morse ([id number]) Visit Report for 04/11/2020 Arrival Information Details Patient Name: Jeffrey Morse, Jeffrey Morse Date of Service: 04/11/2020 8:45 AM Medical Record Number: [id number] Patient Account Number: [id number] Date of Birth/Sex: [birth year] (64 y.o. M) Treating RN: Huel Coventry Primary Care Crimson Dubberly: Blair Heys Other Clinician: Lolita Cram Referring Tamaj Jurgens: Blair Heys Treating Holden Draughon/Extender: Rowan Blase in Treatment: 1 Visit Information History Since Last Visit Added or deleted any medications: Yes Patient Arrived: Ambulatory Had a fall or experienced change in No Arrival Time: 08:46 activities of daily living that may affect Accompanied By: aunt risk of falls: Transfer Assistance: None Hospitalized since last visit: No Patient Identification Verified: Yes Pain Present Now: No Secondary Verification Process Completed: Yes Patient Has Alerts: Yes Patient Alerts: Patient on Blood Thinner Type II Diabetic Electronic Signature(s) Signed: 04/11/2020 3:31:48 PM By: Lolita Cram Entered By: Lolita Cram on 04/11/2020 08:51:50 Jeffrey Morse ([id number]) -------------------------------------------------------------------------------- Encounter Discharge Information Details Patient Name: Jeffrey Morse Date of Service: 04/11/2020 8:45 AM Medical Record Number: [id number] Patient Account Number: [id number] Date of Birth/Sex: 20-Nov-1955 (64 y.o. M) Treating RN: Huel Coventry Primary Care Savannha Welle: Blair Heys Other Clinician: Lolita Cram Referring Gemayel Mascio: Blair Heys Treating Khalel Alms/Extender: Rowan Blase in Treatment: 1 Encounter Discharge Information Items Discharge Condition: Stable Ambulatory Status: Ambulatory Discharge Destination: Home Transportation: Private Auto Accompanied By: wife Schedule Follow-up Appointment: Yes Clinical Summary of Care: Electronic Signature(s) Signed: 04/11/2020 5:32:24 PM By: Elliot Gurney,  BSN, RN, CWS, Kim RN, BSN Entered By: Elliot Gurney, BSN, RN, CWS, Kim on 04/11/2020 09:10:13 Jeffrey Morse ([id number]) -------------------------------------------------------------------------------- Lower Extremity Assessment Details Patient Name: Jeffrey Morse Date of Service: 04/11/2020 8:45 AM Medical Record Number: [id number] Patient Account Number: [id number] Date of Birth/Sex: 05/31/1955 (64 y.o. M) Treating RN: Huel Coventry Primary Care Shaquandra Galano: Blair Heys Other Clinician: Lolita Cram Referring Almadelia Looman: Blair Heys Treating Taylynn Easton/Extender: Rowan Blase in Treatment: 1 Electronic Signature(s) Signed: 04/11/2020 3:31:48 PM By: Lolita Cram Signed: 04/11/2020 5:32:24 PM By: Elliot Gurney BSN, RN, CWS, Kim RN, BSN Entered By: Lolita Cram on 04/11/2020 08:58:51 Jeffrey Morse, Jeffrey Morse ([id number]) -------------------------------------------------------------------------------- Multi Wound Chart Details Patient Name: Jeffrey Morse Date of Service: 04/11/2020 8:45 AM Medical Record Number: [id number] Patient Account Number: [id number] Date of Birth/Sex: 12-Dec-1955 (64 y.o. M) Treating RN: Huel Coventry Primary Care Jatin Naumann: Blair Heys Other Clinician: Lolita Cram Referring Anh Bigos: Blair Heys Treating Nameer Summer/Extender: Rowan Blase in Treatment: 1 Vital Signs Height(in): 71 Pulse(bpm): 76 Weight(lbs): 198 Blood Pressure(mmHg): 136/92 Body Mass Index(BMI): 28 Temperature(F): 98.1 Respiratory Rate(breaths/min): 20 Photos: [1:No Photos] [N/A:N/A] Wound Location: [1:Head - occiput] [N/A:N/A] Wounding Event: [1:Pressure Injury] [N/A:N/A] Primary Etiology: [1:Atypical] [N/A:N/A] Comorbid History: [1:Congestive Heart Failure, Hypertension, Myocardial Infarction, Type II Diabetes] [N/A:N/A] Date Acquired: [1:03/06/2020] [N/A:N/A] Weeks of Treatment: [1:1] [N/A:N/A] Wound Status: [1:Open] [N/A:N/A] Measurements L x W x D (cm) [1:3.2x2.9x0.1]  [N/A:N/A] Area (cm) : [1:7.288] [N/A:N/A] Volume (cm) : [1:0.729] [N/A:N/A] % Reduction in Area: [1:24.20%] [N/A:N/A] % Reduction in Volume: [1:24.20%] [N/A:N/A] Classification: [1:Full Thickness Without Exposed Support Structures] [N/A:N/A] Exudate Amount: [1:Medium] [N/A:N/A] Exudate Type: [1:Serosanguineous] [N/A:N/A] Exudate Color: [1:red, brown] [N/A:N/A] Wound Margin: [1:Flat and Intact] [N/A:N/A] Granulation Amount: [1:Large (67-100%)] [N/A:N/A] Granulation Quality: [1:Red] [N/A:N/A] Necrotic Amount: [1:Small (1-33%)] [N/A:N/A] Exposed Structures: [1:Fat Layer (Subcutaneous Tissue): N/A Yes Fascia: No Tendon: No Muscle: No Joint: No Bone: No None] [N/A:N/A] Treatment Notes Electronic Signature(s) Signed: 04/11/2020 5:32:24 PM By: Elliot Gurney, BSN, RN, CWS, Kim RN, BSN Entered By: Elliot Gurney, BSN, RN, CWS, Kim on 04/11/2020 09:05:01 Jeffrey Morse ([id number]) -------------------------------------------------------------------------------- Multi-Disciplinary  Care Plan Details Patient Name: Jeffrey Morse, Jeffrey Morse Date of Service: 04/11/2020 8:45 AM Medical Record Number: [id number] Patient Account Number: [id number] Date of Birth/Sex: Mar 14, 1955 (65 y.o. M) Treating RN: Huel Coventry Primary Care [employer] Mcjunkins: Blair Heys Other Clinician: Lolita Cram Referring Sargon Scouten: Blair Heys Treating Tanaisha Pittman/Extender: Rowan Blase in Treatment: 1 Active Inactive Necrotic Tissue Nursing Diagnoses: Impaired tissue integrity related to necrotic/devitalized tissue Knowledge deficit related to management of necrotic/devitalized tissue Goals: Necrotic/devitalized tissue will be minimized in the wound bed Date Initiated: 04/04/2020 Target Resolution Date: 04/18/2020 Goal Status: Active Patient/caregiver will verbalize understanding of reason and process for debridement of necrotic tissue Date Initiated: 04/04/2020 Target Resolution Date: 04/18/2020 Goal Status: Active Interventions: Assess  patient pain level pre-, during and post procedure and prior to discharge Provide education on necrotic tissue and debridement process Treatment Activities: Apply topical anesthetic as ordered : 04/04/2020 Notes: Orientation to the Wound Care Program Nursing Diagnoses: Knowledge deficit related to the wound healing center program Goals: Patient/caregiver will verbalize understanding of the Wound Healing Center Program Date Initiated: 04/04/2020 Target Resolution Date: 04/18/2020 Goal Status: Active Interventions: Provide education on orientation to the wound center Notes: Wound/Skin Impairment Nursing Diagnoses: Impaired tissue integrity Knowledge deficit related to smoking impact on wound healing Knowledge deficit related to ulceration/compromised skin integrity Goals: Ulcer/skin breakdown will have a volume reduction of 30% by week 4 Date Initiated: 04/04/2020 Target Resolution Date: 04/18/2020 Goal Status: Active Interventions: Assess patient/caregiver ability to obtain necessary supplies Assess ulceration(s) every visit Jeffrey Morse, Jeffrey Morse ([id number]) Treatment Activities: Topical wound management initiated : 04/04/2020 Notes: Electronic Signature(s) Signed: 04/11/2020 5:32:24 PM By: Elliot Gurney, BSN, RN, CWS, Kim RN, BSN Entered By: Elliot Gurney, BSN, RN, CWS, Kim on 04/11/2020 09:04:53 Jeffrey Morse ([id number]) -------------------------------------------------------------------------------- Pain Assessment Details Patient Name: Jeffrey Morse Date of Service: 04/11/2020 8:45 AM Medical Record Number: [id number] Patient Account Number: [id number] Date of Birth/Sex: Oct 09, 1955 (64 y.o. M) Treating RN: Huel Coventry Primary Care Rakwon Letourneau: Blair Heys Other Clinician: Lolita Cram Referring Deborahann Poteat: Blair Heys Treating Laquentin Loudermilk/Extender: Rowan Blase in Treatment: 1 Active Problems Location of Pain Severity and Description of Pain Patient Has Paino No Site  Locations Pain Management and Medication Current Pain Management: Electronic Signature(s) Signed: 04/11/2020 3:31:48 PM By: Lolita Cram Signed: 04/11/2020 5:32:24 PM By: Elliot Gurney, BSN, RN, CWS, Kim RN, BSN Entered By: Lolita Cram on 04/11/2020 08:52:16 Jeffrey Morse ([id number]) -------------------------------------------------------------------------------- Patient/Caregiver Education Details Patient Name: Jeffrey Morse Date of Service: 04/11/2020 8:45 AM Medical Record Number: [id number] Patient Account Number: [id number] Date of Birth/Gender: 03/25/1955 (64 y.o. M) Treating RN: Huel Coventry Primary Care Physician: Blair Heys Other Clinician: Lolita Cram Referring Physician: Blair Heys Treating Physician/Extender: Rowan Blase in Treatment: 1 Education Assessment Education Provided To: Patient Education Topics Provided Wound/Skin Impairment: Handouts: Caring for Your Ulcer Methods: Demonstration, Explain/Verbal Responses: State content correctly Electronic Signature(s) Signed: 04/11/2020 5:32:24 PM By: Elliot Gurney, BSN, RN, CWS, Kim RN, BSN Entered By: Elliot Gurney, BSN, RN, CWS, Kim on 04/11/2020 09:09:29 Jeffrey Morse ([id number]) -------------------------------------------------------------------------------- Wound Assessment Details Patient Name: Jeffrey Morse Date of Service: 04/11/2020 8:45 AM Medical Record Number: [id number] Patient Account Number: [id number] Date of Birth/Sex: [birth year] (64 y.o. M) Treating RN: Huel Coventry Primary Care Christapher Gillian: Blair Heys Other Clinician: Lolita Cram Referring Keryn Nessler: Blair Heys Treating Keyonia Gluth/Extender: Rowan Blase in Treatment: 1 Wound Status Wound Number: 1 Primary Atypical Etiology: Wound Location: Head - occiput Wound Status: Open Wounding Event: Pressure Injury Comorbid Congestive Heart Failure, Hypertension, Myocardial Date Acquired: 03/06/2020 History:  Infarction, Type II  Diabetes Weeks Of Treatment: 1 Clustered Wound: No Photos Photo Uploaded By: Lolita Cram on 04/11/2020 14:29:18 Wound Measurements Length: (cm) 3.2 Width: (cm) 2.9 Depth: (cm) 0.1 Area: (cm) 7.288 Volume: (cm) 0.729 % Reduction in Area: 24.2% % Reduction in Volume: 24.2% Epithelialization: None Tunneling: No Undermining: No Wound Description Classification: Full Thickness Without Exposed Support Structu Wound Margin: Flat and Intact Exudate Amount: Medium Exudate Type: Serosanguineous Exudate Color: red, brown res Foul Odor After Cleansing: No Slough/Fibrino Yes Wound Bed Granulation Amount: Large (67-100%) Exposed Structure Granulation Quality: Red Fascia Exposed: No Necrotic Amount: Small (1-33%) Fat Layer (Subcutaneous Tissue) Exposed: Yes Necrotic Quality: Adherent Slough Tendon Exposed: No Muscle Exposed: No Joint Exposed: No Bone Exposed: No Treatment Notes Wound #1 (Head - occiput) Cleanser Peri-Wound Care Topical Jeffrey Morse, Jeffrey Morse ([id number]) Primary Dressing Silvercel Small 2x2 (in/in) Discharge Instruction: Apply Silvercel Small 2x2 (in/in) as instructed Secondary Dressing Gauze Discharge Instruction: As directed: dry, moistened with saline or moistened with Dakins Solution Secured With Tegaderm Film Transparent 4x4.75 (in/in) Discharge Instruction: Apply to wound bed Compression Wrap Compression Stockings Add-Ons Electronic Signature(s) Signed: 04/11/2020 3:31:48 PM By: Lolita Cram Signed: 04/11/2020 5:32:24 PM By: Elliot Gurney, BSN, RN, CWS, Kim RN, BSN Entered By: Lolita Cram on 04/11/2020 08:57:17 Jeffrey Morse ([id number]) -------------------------------------------------------------------------------- Vitals Details Patient Name: Jeffrey Morse Date of Service: 04/11/2020 8:45 AM Medical Record Number: [id number] Patient Account Number: [id number] Date of Birth/Sex: 03-19-1955 (64 y.o. M) Treating RN: Huel Coventry Primary Care  Adelie Croswell: Blair Heys Other Clinician: Lolita Cram Referring Abbey Veith: Blair Heys Treating Bess Saltzman/Extender: Rowan Blase in Treatment: 1 Vital Signs Time Taken: 08:49 Temperature (F): 98.1 Height (in): 71 Pulse (bpm): 76 Weight (lbs): 198 Respiratory Rate (breaths/min): 20 Body Mass Index (BMI): 27.6 Blood Pressure (mmHg): 136/92 Reference Range: 80 - 120 mg / dl Electronic Signature(s) Signed: 04/11/2020 3:31:48 PM By: Lolita Cram Entered By: Lolita Cram on 04/11/2020 08:52:11

## 2020-04-16 ENCOUNTER — Ambulatory Visit: Payer: BC Managed Care – PPO | Admitting: Occupational Therapy

## 2020-04-16 ENCOUNTER — Ambulatory Visit: Payer: BC Managed Care – PPO

## 2020-04-18 ENCOUNTER — Other Ambulatory Visit: Payer: Self-pay

## 2020-04-18 ENCOUNTER — Ambulatory Visit: Payer: BC Managed Care – PPO | Admitting: Physical Therapy

## 2020-04-18 ENCOUNTER — Encounter: Payer: BC Managed Care – PPO | Admitting: Internal Medicine

## 2020-04-18 ENCOUNTER — Ambulatory Visit: Payer: BC Managed Care – PPO | Admitting: Occupational Therapy

## 2020-04-18 DIAGNOSIS — E11622 Type 2 diabetes mellitus with other skin ulcer: Secondary | ICD-10-CM | POA: Diagnosis not present

## 2020-04-19 NOTE — Progress Notes (Signed)
Jeffrey Morse ([id number]) Visit Report for 04/18/2020 Arrival Information Details Patient Name: Jeffrey Morse, Jeffrey Morse Date of Service: 04/18/2020 9:00 AM Medical Record Number: [id number] Patient Account Number: [id number] Date of Birth/Sex: September 03, 1955 (65 y.o. M) Treating RN: Rogers Blocker Primary Care Maveric Debono: Blair Heys Other Clinician: Lolita Cram Referring Kwamane Whack: Blair Heys Treating Dinesha Twiggs/Extender: Altamese [place] in Treatment: 2 Visit Information History Since Last Visit Has Dressing in Place as Prescribed: Yes Patient Arrived: Ambulatory Pain Present Now: No Arrival Time: 09:09 Accompanied By: self Transfer Assistance: None Patient Identification Verified: Yes Secondary Verification Process Completed: Yes Patient Has Alerts: Yes Patient Alerts: Patient on Blood Thinner Type II Diabetic Electronic Signature(s) Signed: 04/18/2020 5:04:06 PM By: Phillis Haggis, Dondra Prader RN Entered By: Phillis Haggis, Dondra Prader on 04/18/2020 09:10:02 Jeffrey Morse ([id number]) -------------------------------------------------------------------------------- Encounter Discharge Information Details Patient Name: Jeffrey Morse Date of Service: 04/18/2020 9:00 AM Medical Record Number: [id number] Patient Account Number: [id number] Date of Birth/Sex: December 06, 1955 (65 y.o. M) Treating RN: Huel Coventry Primary Care Sueo Cullen: Blair Heys Other Clinician: Lolita Cram Referring Drina Jobst: Blair Heys Treating Pernell Dikes/Extender: Altamese [place] in Treatment: 2 Encounter Discharge Information Items Discharge Condition: Stable Ambulatory Status: Ambulatory Discharge Destination: Home Transportation: Private Auto Accompanied By: wife Schedule Follow-up Appointment: Yes Clinical Summary of Care: Electronic Signature(s) Signed: 04/18/2020 6:32:03 PM By: Elliot Gurney, BSN, RN, CWS, Kim RN, BSN Entered By: Elliot Gurney, BSN, RN, CWS, Kim on 04/18/2020  09:51:06 Jeffrey Morse ([id number]) -------------------------------------------------------------------------------- Lower Extremity Assessment Details Patient Name: Jeffrey Morse Date of Service: 04/18/2020 9:00 AM Medical Record Number: [id number] Patient Account Number: [id number] Date of Birth/Sex: 1955/11/11 (65 y.o. M) Treating RN: Rogers Blocker Primary Care Pleasant Britz: Blair Heys Other Clinician: Lolita Cram Referring Ishmael Berkovich: Blair Heys Treating Dayanis Bergquist/Extender: Altamese [place] in Treatment: 2 Electronic Signature(s) Signed: 04/18/2020 5:04:06 PM By: Phillis Haggis, Dondra Prader RN Entered By: Phillis Haggis, Dondra Prader on 04/18/2020 09:15:37 Jeffrey Morse ([id number]) -------------------------------------------------------------------------------- Multi Wound Chart Details Patient Name: Jeffrey Morse Date of Service: 04/18/2020 9:00 AM Medical Record Number: [id number] Patient Account Number: [id number] Date of Birth/Sex: [birth year] (65 y.o. M) Treating RN: Huel Coventry Primary Care Cailan General: Blair Heys Other Clinician: Lolita Cram Referring Jerricka Carvey: Blair Heys Treating Diania Co/Extender: Altamese [place] in Treatment: 2 Vital Signs Height(in): 71 Pulse(bpm): 73 Weight(lbs): 198 Blood Pressure(mmHg): 120/80 Body Mass Index(BMI): 28 Temperature(F): 98.3 Respiratory Rate(breaths/min): 18 Photos: [N/A:N/A] Wound Location: Head - occiput N/A N/A Wounding Event: Pressure Injury N/A N/A Primary Etiology: Atypical N/A N/A Comorbid History: Congestive Heart Failure, N/A N/A Hypertension, Myocardial Infarction, Type II Diabetes Date Acquired: 03/06/2020 N/A N/A Weeks of Treatment: 2 N/A N/A Wound Status: Open N/A N/A Measurements L x W x D (cm) 2.2x2.1x0.1 N/A N/A Area (cm) : 3.629 N/A N/A Volume (cm) : 0.363 N/A N/A % Reduction in Area: 62.30% N/A N/A % Reduction in Volume: 62.30% N/A N/A Classification: Full  Thickness Without Exposed N/A N/A Support Structures Exudate Amount: Medium N/A N/A Exudate Type: Serosanguineous N/A N/A Exudate Color: red, brown N/A N/A Wound Margin: Flat and Intact N/A N/A Granulation Amount: Large (67-100%) N/A N/A Granulation Quality: Red N/A N/A Necrotic Amount: None Present (0%) N/A N/A Exposed Structures: Fat Layer (Subcutaneous Tissue): N/A N/A Yes Fascia: No Tendon: No Muscle: No Joint: No Bone: No Epithelialization: Medium (34-66%) N/A N/A Procedures Performed: CHEM CAUT GRANULATION TISS N/A N/A Treatment Notes Wound #1 (Head - occiput) Cleanser Peri-Wound Care NOAL, ABSHIER ([id number]) Topical Primary Dressing Hydrofera Blue Ready Transfer Foam, 2.5x2.5 (in/in) Discharge  Instruction: Apply Hydrofera Blue Ready to wound bed as directed Secondary Dressing Gauze Discharge Instruction: As directed: dry, moistened with saline or moistened with Dakins Solution Secured With Tegaderm Film Transparent 4x4.75 (in/in) Discharge Instruction: Apply to wound bed Compression Wrap Compression Stockings Add-Ons Electronic Signature(s) Signed: 04/19/2020 2:54:56 PM By: Baltazar Najjar MD Entered By: Baltazar Najjar on 04/18/2020 09:56:06 Jeffrey Morse ([id number]) -------------------------------------------------------------------------------- Multi-Disciplinary Care Plan Details Patient Name: Jeffrey Morse Date of Service: 04/18/2020 9:00 AM Medical Record Number: [id number] Patient Account Number: [id number] Date of Birth/Sex: [birth year] (65 y.o. M) Treating RN: Huel Coventry Primary Care Danella Philson: Blair Heys Other Clinician: Lolita Cram Referring Mearl Olver: Blair Heys Treating Verba Ainley/Extender: Altamese [place] in Treatment: 2 Active Inactive Necrotic Tissue Nursing Diagnoses: Impaired tissue integrity related to necrotic/devitalized tissue Knowledge deficit related to management of necrotic/devitalized  tissue Goals: Necrotic/devitalized tissue will be minimized in the wound bed Date Initiated: 04/04/2020 Target Resolution Date: 04/18/2020 Goal Status: Active Patient/caregiver will verbalize understanding of reason and process for debridement of necrotic tissue Date Initiated: 04/04/2020 Target Resolution Date: 04/18/2020 Goal Status: Active Interventions: Assess patient pain level pre-, during and post procedure and prior to discharge Provide education on necrotic tissue and debridement process Treatment Activities: Apply topical anesthetic as ordered : 04/04/2020 Notes: Orientation to the Wound Care Program Nursing Diagnoses: Knowledge deficit related to the wound healing center program Goals: Patient/caregiver will verbalize understanding of the Wound Healing Center Program Date Initiated: 04/04/2020 Target Resolution Date: 04/18/2020 Goal Status: Active Interventions: Provide education on orientation to the wound center Notes: Wound/Skin Impairment Nursing Diagnoses: Impaired tissue integrity Knowledge deficit related to smoking impact on wound healing Knowledge deficit related to ulceration/compromised skin integrity Goals: Ulcer/skin breakdown will have a volume reduction of 30% by week 4 Date Initiated: 04/04/2020 Target Resolution Date: 04/18/2020 Goal Status: Active Interventions: Assess patient/caregiver ability to obtain necessary supplies Assess ulceration(s) every visit DAMIEN, BATTY ([id number]) Treatment Activities: Topical wound management initiated : 04/04/2020 Notes: Electronic Signature(s) Signed: 04/18/2020 6:32:03 PM By: Elliot Gurney, BSN, RN, CWS, Kim RN, BSN Entered By: Elliot Gurney, BSN, RN, CWS, Kim on 04/18/2020 09:39:36 Jeffrey Morse ([id number]) -------------------------------------------------------------------------------- Pain Assessment Details Patient Name: Jeffrey Morse Date of Service: 04/18/2020 9:00 AM Medical Record Number: [id number] Patient  Account Number: [id number] Date of Birth/Sex: 01-Feb-1956 (65 y.o. M) Treating RN: Rogers Blocker Primary Care Wenonah Milo: Blair Heys Other Clinician: Lolita Cram Referring Nanetta Wiegman: Blair Heys Treating Genese Quebedeaux/Extender: Altamese [place] in Treatment: 2 Active Problems Location of Pain Severity and Description of Pain Patient Has Paino No Site Locations Rate the pain. Current Pain Level: 0 Pain Management and Medication Current Pain Management: Electronic Signature(s) Signed: 04/18/2020 5:04:06 PM By: Phillis Haggis, Dondra Prader RN Entered By: Phillis Haggis, Dondra Prader on 04/18/2020 09:11:27 Jeffrey Morse ([id number]) -------------------------------------------------------------------------------- Patient/Caregiver Education Details Patient Name: Jeffrey Morse Date of Service: 04/18/2020 9:00 AM Medical Record Number: [id number] Patient Account Number: [id number] Date of Birth/Gender: 09-14-1955 (65 y.o. M) Treating RN: Huel Coventry Primary Care Physician: Blair Heys Other Clinician: Lolita Cram Referring Physician: Blair Heys Treating Physician/Extender: Altamese [place] in Treatment: 2 Education Assessment Education Provided To: Patient Education Topics Provided Wound Debridement: Handouts: Wound Debridement, Other: Chemical cautery used Methods: Demonstration, Explain/Verbal Responses: State content correctly Electronic Signature(s) Signed: 04/18/2020 6:32:03 PM By: Elliot Gurney, BSN, RN, CWS, Kim RN, BSN Entered By: Elliot Gurney, BSN, RN, CWS, Kim on 04/18/2020 09:50:32 Jeffrey Morse ([id number]) -------------------------------------------------------------------------------- Wound Assessment Details Patient Name: Jeffrey Morse Date of Service: 04/18/2020 9:00 AM  Medical Record Number: [id number] Patient Account Number: [id number] Date of Birth/Sex: 1955-12-14 (65 y.o. M) Treating RN: Rogers Blocker Primary Care Brailyn Delman: Blair Heys Other Clinician: Lolita Cram Referring Maddoxx Burkitt: Blair Heys Treating Shetara Launer/Extender: Altamese [place] in Treatment: 2 Wound Status Wound Number: 1 Primary Atypical Etiology: Wound Location: Head - occiput Wound Status: Open Wounding Event: Pressure Injury Comorbid Congestive Heart Failure, Hypertension, Myocardial Date Acquired: 03/06/2020 History: Infarction, Type II Diabetes Weeks Of Treatment: 2 Clustered Wound: No Photos Wound Measurements Length: (cm) 2.2 Width: (cm) 2.1 Depth: (cm) 0.1 Area: (cm) 3.629 Volume: (cm) 0.363 % Reduction in Area: 62.3% % Reduction in Volume: 62.3% Epithelialization: Medium (34-66%) Tunneling: No Undermining: No Wound Description Classification: Full Thickness Without Exposed Support Structu Wound Margin: Flat and Intact Exudate Amount: Medium Exudate Type: Serosanguineous Exudate Color: red, brown res Foul Odor After Cleansing: No Slough/Fibrino Yes Wound Bed Granulation Amount: Large (67-100%) Exposed Structure Granulation Quality: Red Fascia Exposed: No Necrotic Amount: None Present (0%) Fat Layer (Subcutaneous Tissue) Exposed: Yes Tendon Exposed: No Muscle Exposed: No Joint Exposed: No Bone Exposed: No Treatment Notes Wound #1 (Head - occiput) Cleanser Peri-Wound Care Topical JACKIE, LITTLEJOHN ([id number]) Primary Dressing Hydrofera Blue Ready Transfer Foam, 2.5x2.5 (in/in) Discharge Instruction: Apply Hydrofera Blue Ready to wound bed as directed Secondary Dressing Gauze Discharge Instruction: As directed: dry, moistened with saline or moistened with Dakins Solution Secured With Tegaderm Film Transparent 4x4.75 (in/in) Discharge Instruction: Apply to wound bed Compression Wrap Compression Stockings Add-Ons Electronic Signature(s) Signed: 04/18/2020 5:04:06 PM By: Phillis Haggis, Dondra Prader RN Entered By: Phillis Haggis, Dondra Prader on 04/18/2020 09:15:09 Jeffrey Morse  ([id number]) -------------------------------------------------------------------------------- Vitals Details Patient Name: Jeffrey Morse Date of Service: 04/18/2020 9:00 AM Medical Record Number: [id number] Patient Account Number: [id number] Date of Birth/Sex: 09/09/1955 (65 y.o. M) Treating RN: Rogers Blocker Primary Care Dametria Tuzzolino: Blair Heys Other Clinician: Lolita Cram Referring Letonya Mangels: Blair Heys Treating Latrail Pounders/Extender: Altamese [place] in Treatment: 2 Vital Signs Time Taken: 09:10 Temperature (F): 98.3 Height (in): 71 Pulse (bpm): 73 Weight (lbs): 198 Respiratory Rate (breaths/min): 18 Body Mass Index (BMI): 27.6 Blood Pressure (mmHg): 120/80 Reference Range: 80 - 120 mg / dl Electronic Signature(s) Signed: 04/18/2020 5:04:06 PM By: Phillis Haggis, Dondra Prader RN Entered By: Phillis Haggis, Dondra Prader on 04/18/2020 09:11:16

## 2020-04-19 NOTE — Progress Notes (Signed)
TILER, BRANDIS ([id number]) Visit Report for 04/18/2020 HPI Details Patient Name: Jeffrey Morse, Jeffrey Morse Date of Service: 04/18/2020 9:00 AM Medical Record Number: [id number] Patient Account Number: [id number] Date of Birth/Sex: May 09, 1955 (64 y.o. M) Treating RN: Huel Coventry Primary Care Provider: Blair Heys Other Clinician: Lolita Cram Referring Provider: Blair Heys Treating Provider/Extender: Altamese [place] in Treatment: 2 History of Present Illness HPI Description: ADMISSION 04/04/2020 This is a 65 year old man who presented in January to an urgent care with acute coronary syndrome. He had a cardiac arrest requiring cardioversion and then had further arrest requiring cardioversion on the way to and in the hospital and apparently in the Cath Lab. He had an acute inferior lateral MI. I think somewhere in this he had a right thalamic frontal bleed as well and spent some time in rehab. During the stay in rehab it was discovered that he had a black eschar on his occiput. The nurse told him that she thought it might be a pressure wound. The patient's wife has since remove the eschar since he arrived home. He has a large hyper granulated area over the occiput. He also has an extensive patch of alopecia around this area. No other scalp skin lesions or areas are seen. He arrives with several questions about whether recurrent cardioversion like he suffered could cause a burn injury or perhaps the CT scan. He has been applying iodine and Neosporin Past medical history includes type 2 diabetes, coronary artery disease, lumbar spinal stenosis, hypertension, hypercholesterolemia. He is on Plavix. 04/11/2020 on evaluation today patient appears to be doing well with regard to his wound on the scalp. With that being said this is hypergranular. I did review the surgical pathology that was negative for any type of malignancy there was just inflamed granulation tissue. Fortunately there  does not appear to be any evidence of active infection at this time which is wonderful 3/16; patient I admitted 2 weeks ago with a hyper granulated area on his occiput with surrounding alopecia in the setting of recurrent cardioversion. The wound looks really very odd and in spite of the alopecia I felt it was important to make sure this was not an unrecognized malignancy. The biopsies I did were negative. Wound is measuring smaller. We are using silver alginate and Tegaderm Electronic Signature(s) Signed: 04/19/2020 2:54:56 PM By: Baltazar Najjar MD Entered By: Baltazar Najjar on 04/18/2020 09:58:12 Jeffrey Morse ([id number]) -------------------------------------------------------------------------------- Gaynelle Adu TISS Details Patient Name: Jeffrey Morse Date of Service: 04/18/2020 9:00 AM Medical Record Number: [id number] Patient Account Number: [id number] Date of Birth/Sex: 12/27/1955 (64 y.o. M) Treating RN: Huel Coventry Primary Care Provider: Blair Heys Other Clinician: Lolita Cram Referring Provider: Blair Heys Treating Provider/Extender: Altamese [place] in Treatment: 2 Procedure Performed for: Wound #1 Head - occiput Performed By: Physician Maxwell Caul, MD Post Procedure Diagnosis Same as Pre-procedure Notes 4 silver nitrate sticks used Electronic Signature(s) Signed: 04/19/2020 2:54:56 PM By: Baltazar Najjar MD Entered By: Baltazar Najjar on 04/18/2020 09:57:07 Jeffrey Morse ([id number]) -------------------------------------------------------------------------------- Physical Exam Details Patient Name: Jeffrey Morse Date of Service: 04/18/2020 9:00 AM Medical Record Number: [id number] Patient Account Number: [id number] Date of Birth/Sex: 12/31/1955 (64 y.o. M) Treating RN: Huel Coventry Primary Care Provider: Blair Heys Other Clinician: Lolita Cram Referring Provider: Blair Heys Treating Provider/Extender:  Altamese [place] in Treatment: 2 Constitutional Sitting or standing Blood Pressure is within target range for patient.. Pulse regular and within target range for patient.Marland Kitchen Respirations regular, non-  labored and within target range.. Temperature is normal and within the target range for the patient.Marland Kitchen appears in no distress. Notes Wound exam; hypergranulation. I used silver nitrate on this. The area of alopecia seems to be receding. No evidence of infection Electronic Signature(s) Signed: 04/19/2020 2:54:56 PM By: Baltazar Najjar MD Entered By: Baltazar Najjar on 04/18/2020 09:59:01 Jeffrey Morse ([id number]) -------------------------------------------------------------------------------- Physician Orders Details Patient Name: Jeffrey Morse Date of Service: 04/18/2020 9:00 AM Medical Record Number: [id number] Patient Account Number: [id number] Date of Birth/Sex: Mar 29, 1955 (64 y.o. M) Treating RN: Huel Coventry Primary Care Provider: Blair Heys Other Clinician: Lolita Cram Referring Provider: Blair Heys Treating Provider/Extender: Altamese [place] in Treatment: 2 Verbal / Phone Orders: No Diagnosis Coding Follow-up Appointments o Return Appointment in 1 week. Bathing/ Shower/ Hygiene o May shower; gently cleanse wound with antibacterial soap, rinse and pat dry prior to dressing wounds Anesthetic (Use 'Patient Medications' Section for Anesthetic Order Entry) o Lidocaine applied to wound bed Wound Treatment Wound #1 - Head - occiput Primary Dressing: Hydrofera Blue Ready Transfer Foam, 2.5x2.5 (in/in) Discharge Instructions: Apply Hydrofera Blue Ready to wound bed as directed Secondary Dressing: Gauze Discharge Instructions: As directed: dry, moistened with saline or moistened with Dakins Solution Secured With: Tegaderm Film Transparent 4x4.75 (in/in) Discharge Instructions: Apply to wound bed Electronic Signature(s) Signed: 04/18/2020 6:32:03 PM  By: Elliot Gurney, BSN, RN, CWS, Kim RN, BSN Signed: 04/19/2020 2:54:56 PM By: Baltazar Najjar MD Entered By: Elliot Gurney, BSN, RN, CWS, Kim on 04/18/2020 09:49:54 Jeffrey Morse, Jeffrey Morse ([id number]) -------------------------------------------------------------------------------- Problem List Details Patient Name: Jeffrey Morse Date of Service: 04/18/2020 9:00 AM Medical Record Number: [id number] Patient Account Number: [id number] Date of Birth/Sex: Jun 18, 1955 (64 y.o. M) Treating RN: Huel Coventry Primary Care Provider: Blair Heys Other Clinician: Lolita Cram Referring Provider: Blair Heys Treating Provider/Extender: Altamese [place] in Treatment: 2 Active Problems ICD-10 Encounter Code Description Active Date MDM Diagnosis L98.498 Non-pressure chronic ulcer of skin of other sites with other specified 04/04/2020 No Yes severity S00.01XD Abrasion of scalp, subsequent encounter 04/04/2020 No Yes Inactive Problems Resolved Problems Electronic Signature(s) Signed: 04/19/2020 2:54:56 PM By: Baltazar Najjar MD Entered By: Baltazar Najjar on 04/18/2020 09:55:59 Jeffrey Morse ([id number]) -------------------------------------------------------------------------------- Progress Note Details Patient Name: Jeffrey Morse Date of Service: 04/18/2020 9:00 AM Medical Record Number: [id number] Patient Account Number: [id number] Date of Birth/Sex: 10/22/1955 (64 y.o. M) Treating RN: Huel Coventry Primary Care Provider: Blair Heys Other Clinician: Lolita Cram Referring Provider: Blair Heys Treating Provider/Extender: Altamese [place] in Treatment: 2 Subjective History of Present Illness (HPI) ADMISSION 04/04/2020 This is a 65 year old man who presented in January to an urgent care with acute coronary syndrome. He had a cardiac arrest requiring cardioversion and then had further arrest requiring cardioversion on the way to and in the hospital and apparently in the Cath  Lab. He had an acute inferior lateral MI. I think somewhere in this he had a right thalamic frontal bleed as well and spent some time in rehab. During the stay in rehab it was discovered that he had a black eschar on his occiput. The nurse told him that she thought it might be a pressure wound. The patient's wife has since remove the eschar since he arrived home. He has a large hyper granulated area over the occiput. He also has an extensive patch of alopecia around this area. No other scalp skin lesions or areas are seen. He arrives with several questions about whether recurrent cardioversion like he suffered  could cause a burn injury or perhaps the CT scan. He has been applying iodine and Neosporin Past medical history includes type 2 diabetes, coronary artery disease, lumbar spinal stenosis, hypertension, hypercholesterolemia. He is on Plavix. 04/11/2020 on evaluation today patient appears to be doing well with regard to his wound on the scalp. With that being said this is hypergranular. I did review the surgical pathology that was negative for any type of malignancy there was just inflamed granulation tissue. Fortunately there does not appear to be any evidence of active infection at this time which is wonderful 3/16; patient I admitted 2 weeks ago with a hyper granulated area on his occiput with surrounding alopecia in the setting of recurrent cardioversion. The wound looks really very odd and in spite of the alopecia I felt it was important to make sure this was not an unrecognized malignancy. The biopsies I did were negative. Wound is measuring smaller. We are using silver alginate and Tegaderm Objective Constitutional Sitting or standing Blood Pressure is within target range for patient.. Pulse regular and within target range for patient.Marland Kitchen Respirations regular, non- labored and within target range.. Temperature is normal and within the target range for the patient.Marland Kitchen appears in no  distress. Vitals Time Taken: 9:10 AM, Height: 71 in, Weight: 198 lbs, BMI: 27.6, Temperature: 98.3 F, Pulse: 73 bpm, Respiratory Rate: 18 breaths/min, Blood Pressure: 120/80 mmHg. General Notes: Wound exam; hypergranulation. I used silver nitrate on this. The area of alopecia seems to be receding. No evidence of infection Integumentary (Hair, Skin) Wound #1 status is Open. Original cause of wound was Pressure Injury. The date acquired was: 03/06/2020. The wound has been in treatment 2 weeks. The wound is located on the Head - occiput. The wound measures 2.2cm length x 2.1cm width x 0.1cm depth; 3.629cm^2 area and 0.363cm^3 volume. There is Fat Layer (Subcutaneous Tissue) exposed. There is no tunneling or undermining noted. There is a medium amount of serosanguineous drainage noted. The wound margin is flat and intact. There is large (67-100%) red granulation within the wound bed. There is no necrotic tissue within the wound bed. Assessment Active Problems ICD-10 Non-pressure chronic ulcer of skin of other sites with other specified severity Abrasion of scalp, subsequent encounter Jeffrey Morse, Jeffrey Morse ([id number]) Procedures Wound #1 Pre-procedure diagnosis of Wound #1 is an Atypical located on the Head - occiput . An CHEM CAUT GRANULATION TISS procedure was performed by Maxwell Caul, MD. Post procedure Diagnosis Wound #1: Same as Pre-Procedure Notes: 4 silver nitrate sticks used Plan Follow-up Appointments: Return Appointment in 1 week. Bathing/ Shower/ Hygiene: May shower; gently cleanse wound with antibacterial soap, rinse and pat dry prior to dressing wounds Anesthetic (Use 'Patient Medications' Section for Anesthetic Order Entry): Lidocaine applied to wound bed WOUND #1: - Head - occiput Wound Laterality: Primary Dressing: Hydrofera Blue Ready Transfer Foam, 2.5x2.5 (in/in) Discharge Instructions: Apply Hydrofera Blue Ready to wound bed as directed Secondary Dressing:  Gauze Discharge Instructions: As directed: dry, moistened with saline or moistened with Dakins Solution Secured With: Tegaderm Film Transparent 4x4.75 (in/in) Discharge Instructions: Apply to wound bed 1. Change to [facility] today from the silver alginate 2. The wound is making nice progress. 3. The only thing I can think of is that during the time of his cardioversion he must have been on conductive surface question water etc. Electronic Signature(s) Signed: 04/19/2020 2:54:56 PM By: Baltazar Najjar MD Entered By: Baltazar Najjar on 04/18/2020 09:59:59 Jeffrey Morse ([id number]) -------------------------------------------------------------------------------- SuperBill Details Patient  Name: Jeffrey Morse Date of Service: 04/18/2020 Medical Record Number: [id number] Patient Account Number: [id number] Date of Birth/Sex: [birth year] (64 y.o. 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

## 2020-04-22 NOTE — Progress Notes (Signed)
I agree with the above plan 

## 2020-04-23 ENCOUNTER — Ambulatory Visit: Payer: BC Managed Care – PPO | Admitting: Physical Therapy

## 2020-04-23 ENCOUNTER — Ambulatory Visit: Payer: BC Managed Care – PPO | Admitting: Occupational Therapy

## 2020-04-24 ENCOUNTER — Encounter: Payer: BC Managed Care – PPO | Attending: Registered Nurse | Admitting: Physical Medicine & Rehabilitation

## 2020-04-24 ENCOUNTER — Other Ambulatory Visit: Payer: Self-pay

## 2020-04-24 ENCOUNTER — Encounter: Payer: Self-pay | Admitting: Physical Medicine & Rehabilitation

## 2020-04-24 VITALS — BP 126/86 | HR 76 | Temp 98.1°F | Ht 71.0 in | Wt 199.0 lb

## 2020-04-24 DIAGNOSIS — I639 Cerebral infarction, unspecified: Secondary | ICD-10-CM

## 2020-04-24 NOTE — Progress Notes (Signed)
Subjective:    Patient ID: Jeffrey Morse, male    DOB: [birth year], 65 y.o.   MRN: [id number] 65 y.o. right-handed male with history of hypertension as well as hyperlipidemia.  Patient lives with spouse independent prior to admission working full-time.  Presented 02/15/2020 with chest pain went to urgent care where he collapsed CPR initiated.  He was in ventricular fibrillation with shock successfully.  CPR was done for approximately 15 minutes.  While in the ambulance he lost pulse again went to PEA arrest CPR resumed.  He did require intubation.  Patient was hypotensive but improved with epinephrine.  Echocardiogram revealed severely reduced LV systolic function no evidence of pericardial effusion.  EKG after CPR showed evidence of inferior ST elevation with lateral involvement suggestive of left circumflex occlusion.  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  Recurrent VT after cardiac cath defibrillated x1 maintained on amiodarone.  Neurology consulted 02/17/2020 for left-sided weakness.  EEG negative.  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  New right thalamic microhemorrhage as compared to scan from 01/18/2012.  MRA was unremarkable.  Currently maintained on aspirin and Plavix for CVA prophylaxis.  Hospital course ileus nasogastric tube placed 02/21/2020 diet slowly advanced.  Findings of elevated hemoglobin A1c 6.0 maintained on Levemir.  Bouts of agitation restlessness required Seroquel.  Patient did have persistent fevers with some question related to right upper extremity thrombus versus pneumonia it did resolve after Precedex stopped so felt to be possibly drug fever.  Venous Doppler studies negative.  Therapy evaluations completed due to patient's decreased functional mobility was admitted for a comprehensive rehab program.     Admit date: 02/28/2020 Discharge date: 03/09/2020  HPI   Finished OP PT, OT, now is  modified independent with all self-care and mobility.  The patient is back to most of his usual activities at home but has not returned to work.  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  He is now very careful when he is building things so he does not trip on objects on the ground.  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  He does not feel like he can do this anymore.  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  Has chronic low back pain as well as R>L knee pain, hx of Right knee arthroscopic  Right hand feels weaker Tried going up ladder one step but did not feel steady Now using handrail for stairs   Has seen PCP, off Metformin, started on Jardiance per cardiology.  Pain Inventory Average Pain 0 Pain Right Now 0 My pain is aching  LOCATION OF PAIN  Knee, back  BOWEL Number of stools per week: 5-8 Oral laxative use No  Type of laxative na Enema or suppository use No  History of colostomy No  Incontinent No   BLADDER Normal In and out cath, frequency na Able to self cath na Bladder incontinence No  Frequent urination No  Leakage with coughing No  Difficulty starting stream No  Incomplete bladder emptying No    Mobility walk with assistance ability to climb steps?  yes do you drive?  yes  Function I need assistance with the following:  bathing  Neuro/Psych loss of taste or smell  Prior Studies Any changes since last visit?  no  Physicians involved in your care Any changes since last visit?  no  Family History  Problem Relation Age of Onset  . Colon cancer Mother   . Stroke Father    Social History   Socioeconomic History  . Marital status: Married    Spouse name: Not on file  . Number of children: Not on file  . Years of education: Not on file  . Highest education level: Not on file  Occupational History  . Not on file  Tobacco Use  . Smoking status: Former Games developer  . Smokeless tobacco: Never  Used  . Tobacco comment: social smoker in Scientist, forensic  . Vaping Use: Never used  Substance and Sexual Activity  . Alcohol use: Not Currently  . Drug use: Never  . Sexual activity: Yes  Other Topics Concern  . Not on file  Social History Narrative   ** Merged History Encounter **       Social Determinants of Health   Financial Resource Strain: Not on file  Food Insecurity: Not on file  Transportation Needs: Not on file  Physical Activity: Not on file  Stress: Not on file  Social Connections: Not on file   Past Surgical History:  Procedure Laterality Date  . BACK SURGERY    . CARDIAC CATHETERIZATION    . CORONARY STENT INTERVENTION N/A 02/15/2020   Procedure: CORONARY STENT INTERVENTION;  Surgeon: Iran Ouch, MD;  Location: [facility] INVASIVE [facility];  Service: Cardiovascular;  Laterality: N/A;  CFX  . KNEE SURGERY Left 2012  . LEFT HEART CATH AND CORONARY ANGIOGRAPHY N/A 02/15/2020   Procedure: LEFT HEART CATH AND CORONARY ANGIOGRAPHY;  Surgeon: Iran Ouch, MD;  Location: [facility];  Service: Cardiovascular;  Laterality: N/A;  . RIGHT HEART CATH N/A 02/15/2020   Procedure: RIGHT HEART CATH;  Surgeon: Iran Ouch, MD;  Location: [facility];  Service: Cardiovascular;  Laterality: N/A;  . VENTRICULAR ASSIST DEVICE INSERTION N/A 02/15/2020   Procedure: VENTRICULAR ASSIST DEVICE INSERTION;  Surgeon: Iran Ouch, MD;  Location: [facility];  Service: Cardiovascular;  Laterality: N/A;  iMPELLA    Past Medical History:  Diagnosis Date  . HTN (hypertension)   . Hypercholesteremia   . Hypertension    Borderline hypertension  . Sleep apnea   . Stroke (HCC)    BP 126/86   Pulse 76   Temp 98.1 F (36.7 C)   Ht 5\' 11"  (1.803 m)   Wt 199 lb (90.3 kg)   SpO2 96%   BMI 27.75 kg/m   Opioid Risk Score:   Fall Risk Score:  `1  Depression screen PHQ 2/9  Depression screen [facility] 2/9 04/24/2020 03/20/2020  Decreased Interest 0 0   Down, Depressed, Hopeless 0 0  PHQ - 2 Score 0 0  Altered sleeping - 0  Tired, decreased energy - 3  Change in appetite - 0  Feeling bad or failure about yourself  - 0  Trouble concentrating - 0  Moving slowly or fidgety/restless - 0  PHQ-9 Score - 3   Review of Systems  Musculoskeletal:       Knee pain Back pain  All other systems reviewed and are negative.      Objective:   Physical Exam Vitals and nursing note reviewed.  Constitutional:      Appearance: Normal appearance.  HENT:     Head: Normocephalic and atraumatic.  Eyes:     General: No visual field deficit.    Extraocular Movements: Extraocular movements intact.  Conjunctiva/sclera: Conjunctivae normal.     Pupils: Pupils are equal, round, and reactive to light.  Skin:    General: Skin is warm and dry.  Neurological:     Mental Status: He is alert and oriented to person, place, and time.     Cranial Nerves: No cranial nerve deficit, dysarthria or facial asymmetry.     Coordination: Coordination is intact.     Gait: Tandem walk abnormal.     Comments: Motor strength is 5/5 bilateral deltoid, bicep, tricep 4/5 right grip, 5/5 left grip 5/5 bilateral hip flexor knee extensor ankle dorsiflexor Negative straight leg raising bilaterally Sensation intact to light touch bilateral upper and lower limbs Ambulates without assistive device no evidence of toe drag or knee instability Able to toe walk and heel walk but not able to perform tandem gait.  Psychiatric:        Mood and Affect: Mood normal.        Behavior: Behavior normal.        Thought Content: Thought content normal.        Judgment: Judgment normal.           Assessment & Plan:  #1.  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  We discussed that he would be able to  work in a more sedentary position. In terms of return to driving he is doing some limited driving with his wife in the car.  I instructed him to get the okay from cardiology to resume driving but from my standpoint he should be okay Physical medicine rehab follow-up on as needed basis  We discussed his residual deficits how they may affect his daily activities.  We discussed the importance of maintaining a home exercise program.  We also discussed walking 30 minutes a day  Patient to follow-up with primary care, neurology as well as cardiology

## 2020-04-24 NOTE — Patient Instructions (Signed)
Call for appt if needed

## 2020-04-25 ENCOUNTER — Encounter: Payer: BC Managed Care – PPO | Admitting: Internal Medicine

## 2020-04-25 ENCOUNTER — Ambulatory Visit: Payer: BC Managed Care – PPO | Admitting: Physical Therapy

## 2020-04-25 ENCOUNTER — Encounter: Payer: BC Managed Care – PPO | Admitting: Occupational Therapy

## 2020-04-25 DIAGNOSIS — E11622 Type 2 diabetes mellitus with other skin ulcer: Secondary | ICD-10-CM | POA: Diagnosis not present

## 2020-04-26 NOTE — Progress Notes (Signed)
Jeffrey Morse, Jeffrey Morse ([id number]) Visit Report for 04/25/2020 HPI Details Patient Name: Jeffrey Morse, Jeffrey Morse Date of Service: 04/25/2020 9:15 AM Medical Record Number: [id number] Patient Account Number: [id number] Date of Birth/Sex: 10/15/1955 (64 y.o. M) Treating RN: Huel Coventry Primary Care Provider: Blair Heys Other Clinician: Referring Provider: Blair Heys Treating Provider/Extender: Altamese [place] in Treatment: 3 History of Present Illness HPI Description: ADMISSION 04/04/2020 This is a 65 year old man who presented in January to an urgent care with acute coronary syndrome. He had a cardiac arrest requiring cardioversion and then had further arrest requiring cardioversion on the way to and in the hospital and apparently in the Cath Lab. He had an acute inferior lateral MI. I think somewhere in this he had a right thalamic frontal bleed as well and spent some time in rehab. During the stay in rehab it was discovered that he had a black eschar on his occiput. The nurse told him that she thought it might be a pressure wound. The patient's wife has since remove the eschar since he arrived home. He has a large hyper granulated area over the occiput. He also has an extensive patch of alopecia around this area. No other scalp skin lesions or areas are seen. He arrives with several questions about whether recurrent cardioversion like he suffered could cause a burn injury or perhaps the CT scan. He has been applying iodine and Neosporin Past medical history includes type 2 diabetes, coronary artery disease, lumbar spinal stenosis, hypertension, hypercholesterolemia. He is on Plavix. 04/11/2020 on evaluation today patient appears to be doing well with regard to his wound on the scalp. With that being said this is hypergranular. I did review the surgical pathology that was negative for any type of malignancy there was just inflamed granulation tissue. Fortunately there does not appear to be  any evidence of active infection at this time which is wonderful 3/16; patient I admitted 2 weeks ago with a hyper granulated area on his occiput with surrounding alopecia in the setting of recurrent cardioversion. The wound looks really very odd and in spite of the alopecia I felt it was important to make sure this was not an unrecognized malignancy. The biopsies I did were negative. Wound is measuring smaller. We are using silver alginate and Tegaderm 3/23; wound continues to contract. Using Hydrofera Blue. Slightly hyper granulated Electronic Signature(s) Signed: 04/25/2020 5:15:48 PM By: Baltazar Najjar MD Entered By: Baltazar Najjar on 04/25/2020 10:17:23 Jeffrey Morse ([id number]) -------------------------------------------------------------------------------- Physical Exam Details Patient Name: Jeffrey Morse Date of Service: 04/25/2020 9:15 AM Medical Record Number: [id number] Patient Account Number: [id number] Date of Birth/Sex: 1955/06/18 (64 y.o. M) Treating RN: Huel Coventry Primary Care Provider: Blair Heys Other Clinician: Referring Provider: Blair Heys Treating Provider/Extender: Altamese [place] in Treatment: 3 Notes Wound exam; slight hypergranulation I did not address this today as the wound was a lot smaller. I did not feel silver nitrate was necessary either. The area of alopecia around this area appears to be received. No evidence of Electronic Signature(s) Signed: 04/25/2020 5:15:48 PM By: Baltazar Najjar MD Entered By: Baltazar Najjar on 04/25/2020 10:17:56 Jeffrey Morse ([id number]) -------------------------------------------------------------------------------- Physician Orders Details Patient Name: Jeffrey Morse Date of Service: 04/25/2020 9:15 AM Medical Record Number: [id number] Patient Account Number: [id number] Date of Birth/Sex: December 20, 1955 (64 y.o. M) Treating RN: Huel Coventry Primary Care Provider: Blair Heys Other  Clinician: Referring Provider: Blair Heys Treating Provider/Extender: Altamese [place] in Treatment: 3 Verbal / Phone Orders: No Diagnosis  Coding Follow-up Appointments o Return Appointment in 2 weeks. Bathing/ Shower/ Hygiene o May shower; gently cleanse wound with antibacterial soap, rinse and pat dry prior to dressing wounds Anesthetic (Use 'Patient Medications' Section for Anesthetic Order Entry) o Lidocaine applied to wound bed Wound Treatment Wound #1 - Head - occiput Primary Dressing: Hydrofera Blue Ready Transfer Foam, 2.5x2.5 (in/in) Discharge Instructions: Apply Hydrofera Blue Ready to wound bed as directed Secondary Dressing: Gauze Discharge Instructions: As directed: dry, moistened with saline or moistened with Dakins Solution Secured With: Tegaderm Film Transparent 4x4.75 (in/in) Discharge Instructions: Apply to wound bed Electronic Signature(s) Signed: 04/25/2020 5:15:48 PM By: Baltazar Najjar MD Signed: 04/25/2020 6:23:16 PM By: Elliot Gurney, BSN, RN, CWS, Kim RN, BSN Entered By: Elliot Gurney, BSN, RN, CWS, Kim on 04/25/2020 09:52:36 Jeffrey Morse ([id number]) -------------------------------------------------------------------------------- Problem List Details Patient Name: Jeffrey Morse Date of Service: 04/25/2020 9:15 AM Medical Record Number: [id number] Patient Account Number: [id number] Date of Birth/Sex: 10-14-1955 (64 y.o. 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 M) Treating RN: Huel Coventry Primary Care Provider: Blair Heys Other Clinician: Referring Provider: Blair Heys Treating Provider/Extender: Altamese [place] in Treatment: 3 Subjective History of Present Illness (HPI) ADMISSION 04/04/2020 This is a 65 year old man who presented in January to an urgent care with acute coronary syndrome. He had a cardiac arrest requiring cardioversion and then had further arrest requiring cardioversion on the way to and in the hospital and apparently in the Cath Lab. He had an acute inferior lateral MI. I think somewhere in this he had a right thalamic frontal bleed as well and spent some time in rehab. During the stay in rehab it was discovered that he had a black eschar on his occiput. The nurse told him that she thought it might be a pressure wound. The patient's wife has since remove the eschar since he arrived home. He has a large hyper granulated area over the occiput. He also has an extensive patch of alopecia around this area. No other scalp skin lesions or areas are seen. He arrives with several questions about whether recurrent cardioversion like he suffered could cause a burn injury or perhaps the CT scan. He has been applying iodine and Neosporin Past medical history includes type 2 diabetes, coronary artery disease, lumbar spinal stenosis, hypertension, hypercholesterolemia. He is on Plavix. 04/11/2020 on evaluation today patient appears to be doing well with regard to his wound on the scalp. With that being said this is hypergranular. I did review the surgical  pathology that was negative for any type of malignancy there was just inflamed granulation tissue. Fortunately there does not appear to be any evidence of active infection at this time which is wonderful 3/16; patient I admitted 2 weeks ago with a hyper granulated area on his occiput with surrounding alopecia in the setting of recurrent cardioversion. The wound looks really very odd  and in spite of the alopecia I felt it was important to make sure this was not an unrecognized malignancy. The biopsies I did were negative. Wound is measuring smaller. We are using silver alginate and Tegaderm 3/23; wound continues to contract. Using Hydrofera Blue. Slightly hyper granulated Objective Constitutional Vitals Time Taken: 9:28 AM, Height: 71 in, Weight: 198 lbs, BMI: 27.6, Temperature: 98.1 F, Pulse: 67 bpm, Respiratory Rate: 18 breaths/min, Blood Pressure: 122/85 mmHg. Integumentary (Hair, Skin) Wound #1 status is Open. Original cause of wound was Pressure Injury. The date acquired was: 03/06/2020. The wound has been in treatment 3 weeks. The wound is located on the Head - occiput. The wound measures 1.6cm length x 1.1cm width x 0.1cm depth; 1.382cm^2 area and 0.138cm^3 volume. There is Fat Layer (Subcutaneous Tissue) exposed. There is no tunneling or undermining noted. There is a medium amount of sanguinous drainage noted. The wound margin is flat and intact. There is large (67-100%) red, friable granulation within the wound bed. There is no necrotic tissue within the wound bed. Assessment Active Problems ICD-10 Non-pressure chronic ulcer of skin of other sites with other specified severity Abrasion of scalp, subsequent encounter Jeffrey Morse, Jeffrey Morse ([id number]) Plan Follow-up Appointments: Return Appointment in 2 weeks. Bathing/ Shower/ Hygiene: May shower; gently cleanse wound with antibacterial soap, rinse and pat dry prior to dressing wounds Anesthetic (Use 'Patient Medications' Section for  Anesthetic Order Entry): Lidocaine applied to wound bed WOUND #1: - Head - occiput Wound Laterality: Primary Dressing: Hydrofera Blue Ready Transfer Foam, 2.5x2.5 (in/in) Discharge Instructions: Apply Hydrofera Blue Ready to wound bed as directed Secondary Dressing: Gauze Discharge Instructions: As directed: dry, moistened with saline or moistened with Dakins Solution Secured With: Tegaderm Film Transparent 4x4.75 (in/in) Discharge Instructions: Apply to wound bed 1. We continued with the Hydrofera Blue 2. Follow-up in 2 weeks. I encouraged the patient to call us if anything appears out of the ordinary. Otherwise this area appears to be gradually healing. 3. No evidence of surrounding infection. The area of alopecia also appears to be received Electronic Signature(s) Signed: 04/25/2020 5:15:48 PM By: Baltazar Najjar MD Entered By: Baltazar Najjar on 04/25/2020 10:18:35 Jeffrey Morse ([id number]) -------------------------------------------------------------------------------- SuperBill Details Patient Name: Jeffrey Morse Date of Service: 04/25/2020 Medical Record Number: [id number] Patient Account Number: [id number] Date of Birth/Sex: 12-Jan-1956 (64 y.o. M) Treating RN: Huel Coventry Primary Care Provider: Blair Heys Other Clinician: Referring Provider: Blair Heys Treating Provider/Extender: Altamese [place] in Treatment: 3 Diagnosis Coding ICD-10 Codes Code Description [phone] Non-pressure chronic ulcer of skin of other sites with other specified severity S00.01XD Abrasion of scalp, subsequent encounter Facility Procedures CPT4 Code: [id number] Description: 99213 - WOUND CARE VISIT-LEV 3 EST PT Modifier: Quantity: 1 Physician Procedures CPT4 Code: [id number] Description: 99213 - WC PHYS LEVEL 3 - EST PT Modifier: Quantity: 1 CPT4 Code: Description: ICD-10 Diagnosis Description 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

## 2020-04-26 NOTE — Progress Notes (Signed)
Jeffrey Morse, Jeffrey Morse ([id number]) Visit Report for 04/25/2020 Arrival Information Details Patient Name: Jeffrey Morse, Jeffrey Morse Date of Service: 04/25/2020 9:15 AM Medical Record Number: [id number] Patient Account Number: Jeffrey Morse Date of Birth/Sex: 15-Nov-1955 (64 y.o. M) Treating RN: Rogers Blocker Primary Care Beanca Kiester: Blair Heys Other Clinician: Referring Connor Meacham: Blair Heys Treating Jagjit Riner/Extender: Altamese [place] in Treatment: 3 Visit Information History Since Last Visit Added or deleted any medications: No Patient Arrived: Ambulatory Had a fall or experienced change in No Arrival Time: 09:28 activities of daily living that may affect Accompanied By: self risk of falls: Transfer Assistance: None Hospitalized since last visit: No Patient Identification Verified: Yes Has Dressing in Place as Prescribed: Yes Secondary Verification Process Completed: Yes Pain Present Now: No Patient Has Alerts: Yes Patient Alerts: Patient on Blood Thinner Type II Diabetic Electronic Signature(s) Signed: 04/25/2020 4:40:52 PM By: Phillis Haggis, Dondra Prader RN Entered By: Phillis Haggis, Dondra Prader on 04/25/2020 09:28:47 Jeffrey Morse ([id number]) -------------------------------------------------------------------------------- Clinic Level of Care Assessment Details Patient Name: Jeffrey Morse Date of Service: 04/25/2020 9:15 AM Medical Record Number: [id number] Patient Account Number: Jeffrey Morse Date of Birth/Sex: 1955/09/13 (64 y.o. M) Treating RN: Huel Coventry Primary Care Elvi Leventhal: Blair Heys Other Clinician: Referring Chalisa Kobler: Blair Heys Treating Gidget Quizhpi/Extender: Altamese [place] in Treatment: 3 Clinic Level of Care Assessment Items TOOL 4 Quantity Score []  - Use when only an EandM is performed on FOLLOW-UP visit 0 ASSESSMENTS - Nursing Assessment / Reassessment X - Reassessment of Co-morbidities (includes updates in patient status) 1 10 X- 1  5 Reassessment of Adherence to Treatment Plan ASSESSMENTS - Wound and Skin Assessment / Reassessment X - Simple Wound Assessment / Reassessment - one wound 1 5 []  - 0 Complex Wound Assessment / Reassessment - multiple wounds []  - 0 Dermatologic / Skin Assessment (not related to wound area) ASSESSMENTS - Focused Assessment []  - Circumferential Edema Measurements - multi extremities 0 []  - 0 Nutritional Assessment / Counseling / Intervention []  - 0 Lower Extremity Assessment (monofilament, tuning fork, pulses) []  - 0 Peripheral Arterial Disease Assessment (using hand held doppler) ASSESSMENTS - Ostomy and/or Continence Assessment and Care []  - Incontinence Assessment and Management 0 []  - 0 Ostomy Care Assessment and Management (repouching, etc.) PROCESS - Coordination of Care X - Simple Patient / Family Education for ongoing care 1 15 []  - 0 Complex (extensive) Patient / Family Education for ongoing care X- 1 10 Staff obtains , Records, Test Results / Process Orders []  - 0 Staff telephones HHA, Nursing Homes / Clarify orders / etc []  - 0 Routine Transfer to another Facility (non-emergent condition) []  - 0 Routine Hospital Admission (non-emergent condition) []  - 0 New Admissions / / Ordering NPWT, Apligraf, etc. []  - 0 Emergency Hospital Admission (emergent condition) X- 1 10 Simple Discharge Coordination []  - 0 Complex (extensive) Discharge Coordination PROCESS - Special Needs []  - Pediatric / Minor Patient Management 0 []  - 0 Isolation Patient Management []  - 0 Hearing / Language / Visual special needs []  - 0 Assessment of Community assistance (transportation, D/C planning, etc.) []  - 0 Additional assistance / Altered mentation []  - 0 Support Surface(s) Assessment (bed, cushion, seat, etc.) INTERVENTIONS - Wound Cleansing / Measurement Jeffrey Morse, Jeffrey Morse. ( ) X- 1 5 Simple Wound Cleansing - one wound []  - 0 Complex Wound  Cleansing - multiple wounds X- 1 5 Wound Imaging (photographs - any number of wounds) []  - 0 Wound Tracing (instead of photographs) X- 1 5 Simple Wound Measurement -  one wound []  - 0 Complex Wound Measurement - multiple wounds INTERVENTIONS - Wound Dressings []  - Small Wound Dressing one or multiple wounds 0 X- 1 15 Medium Wound Dressing one or multiple wounds []  - 0 Large Wound Dressing one or multiple wounds []  - 0 Application of Medications - topical []  - 0 Application of Medications - injection INTERVENTIONS - Miscellaneous []  - External ear exam 0 []  - 0 Specimen Collection (cultures, biopsies, blood, body fluids, etc.) []  - 0 Specimen(s) / Culture(s) sent or taken to Lab for analysis []  - 0 Patient Transfer (multiple staff / / Similar devices) []  - 0 Simple Staple / Suture removal (25 or less) []  - 0 Complex Staple / Suture removal (26 or more) []  - 0 Hypo / Hyperglycemic Management (close monitor of Blood Glucose) []  - 0 Ankle / Brachial Index (ABI) - do not check if billed separately X- 1 5 Vital Signs Has the patient been seen at the hospital within the last three years: Yes Total Score: 90 Level Of Care: New/Established - Level 3 Electronic Signature(s) Signed: 04/25/2020 6:23:16 PM By: , BSN, RN, CWS, Kim RN, BSN Entered By: , BSN, RN, CWS, Kim on 04/25/2020 09:55:25 ( ) -------------------------------------------------------------------------------- Encounter Discharge Information Details Patient Name: Date of Service: 04/25/2020 9:15 AM Medical Record Number: Nurse, adult Patient Account Number: Date of Birth/Sex: 06-21-1955 (64 y.o. M) Treating RN: Primary Care Odell Choung: 04/27/2020 Other Clinician: Referring Chong Wojdyla: Elliot Gurney Treating Tevon Berhane/Extender: Elliot Gurney in Treatment: 3 Encounter Discharge Information Items Discharge Condition:  Stable Ambulatory Status: Ambulatory Discharge Destination: Home Transportation: Private Auto Accompanied By: self Schedule Follow-up Appointment: Yes Clinical Summary of Care: Electronic Signature(s) Signed: 04/25/2020 6:23:16 PM By: Jeffrey Morse, BSN, RN, CWS, Kim RN, BSN Entered By: [id number], BSN, RN, CWS, Kim on 04/25/2020 09:56:53 04/27/2020 ([id number]) -------------------------------------------------------------------------------- Lower Extremity Assessment Details Patient Name: Jeffrey Morse Date of Service: 04/25/2020 9:15 AM Medical Record Number: Jeffrey Morse Patient Account Number: Huel Coventry Date of Birth/Sex: 1955/03/08 (64 y.o. M) Treating RN: Altamese [place] Primary Care Windi Toro: 04/27/2020 Other Clinician: Referring Makaylen Thieme: Elliot Gurney Treating Damonte Frieson/Extender: Elliot Gurney in Treatment: 3 Electronic Signature(s) Signed: 04/25/2020 4:40:52 PM By: Jeffrey Morse, [id number] RN Entered By: Jeffrey Morse, 04/27/2020 on 04/25/2020 09:35:13 Jeffrey Morse (10/05/1955) -------------------------------------------------------------------------------- Multi Wound Chart Details Patient Name: Jeffrey Morse Date of Service: 04/25/2020 9:15 AM Medical Record Number: Blair Heys Patient Account Number: Blair Heys Date of Birth/Sex: [birth year] (64 y.o. M) Treating RN: Phillis Haggis Primary Care Meyer Dockery: Dondra Prader Other Clinician: Referring Mirel Hundal: Phillis Haggis Treating Keashia Haskins/Extender: Dondra Prader in Treatment: 3 Vital Signs Height(in): 71 Pulse(bpm): 67 Weight(lbs): 198 Blood Pressure(mmHg): 122/85 Body Mass Index(BMI): 28 Temperature(F): 98.1 Respiratory Rate(breaths/min): 18 Photos: [N/A:N/A] Wound Location: Head - occiput N/A N/A Wounding Event: Pressure Injury N/A N/A Primary Etiology: Atypical N/A N/A Comorbid History: Congestive Heart Failure, N/A N/A Hypertension, Myocardial Infarction, Type II Diabetes Date  Acquired: 03/06/2020 N/A N/A Weeks of Treatment: 3 N/A N/A Wound Status: Open N/A N/A Measurements L x W x D (cm) 1.6x1.1x0.1 N/A N/A Area (cm) : 1.382 N/A N/A Volume (cm) : 0.138 N/A N/A % Reduction in Area: 85.60% N/A N/A % Reduction in Volume: 85.70% N/A N/A Classification: Full Thickness Without Exposed N/A N/A Support Structures Exudate Amount: Medium N/A N/A Exudate Type: Sanguinous N/A N/A Exudate Color: red N/A N/A Wound Margin: Flat and Intact N/A N/A Granulation Amount: Large (67-100%) N/A N/A  Granulation Quality: Red, Friable N/A N/A Necrotic Amount: None Present (0%) N/A N/A Exposed Structures: Fat Layer (Subcutaneous Tissue): N/A N/A Yes Fascia: No Tendon: No Muscle: No Joint: No Bone: No Epithelialization: Medium (34-66%) N/A N/A Treatment Notes Wound #1 (Head - occiput) Cleanser Peri-Wound Care Topical Jeffrey Morse, Jeffrey Morse ([id number]) Primary Dressing Hydrofera Blue Ready Transfer Foam, 2.5x2.5 (in/in) Quantity: 1 Discharge Instruction: Apply Hydrofera Blue Ready to wound bed as directed Secondary Dressing Gauze Discharge Instruction: As directed: dry, moistened with saline or moistened with Dakins Solution Secured With Tegaderm Film Transparent 4x4.75 (in/in) Quantity: 1 Discharge Instruction: Apply to wound bed Compression Wrap Compression Stockings Add-Ons Electronic Signature(s) Signed: 04/25/2020 5:15:48 PM By: Baltazar Najjar MD Entered By: Baltazar Najjar on 04/25/2020 10:16:29 Jeffrey Morse ([id number]) -------------------------------------------------------------------------------- Multi-Disciplinary Care Plan Details Patient Name: Jeffrey Morse Date of Service: 04/25/2020 9:15 AM Medical Record Number: [id number] Patient Account Number: Jeffrey Morse Date of Birth/Sex: [birth year] (64 y.o. M) Treating RN: Huel Coventry Primary Care Shrihan Putt: Blair Heys Other Clinician: Referring Nicko Daher: Blair Heys Treating Elizardo Chilson/Extender:  Altamese [place] in Treatment: 3 Active Inactive Necrotic Tissue Nursing Diagnoses: Impaired tissue integrity related to necrotic/devitalized tissue Knowledge deficit related to management of necrotic/devitalized tissue Goals: Necrotic/devitalized tissue will be minimized in the wound bed Date Initiated: 04/04/2020 Target Resolution Date: 04/18/2020 Goal Status: Active Patient/caregiver will verbalize understanding of reason and process for debridement of necrotic tissue Date Initiated: 04/04/2020 Target Resolution Date: 04/18/2020 Goal Status: Active Interventions: Assess patient pain level pre-, during and post procedure and prior to discharge Provide education on necrotic tissue and debridement process Treatment Activities: Apply topical anesthetic as ordered : 04/04/2020 Notes: Orientation to the Wound Care Program Nursing Diagnoses: Knowledge deficit related to the wound healing center program Goals: Patient/caregiver will verbalize understanding of the Wound Healing Center Program Date Initiated: 04/04/2020 Target Resolution Date: 04/18/2020 Goal Status: Active Interventions: Provide education on orientation to the wound center Notes: Wound/Skin Impairment Nursing Diagnoses: Impaired tissue integrity Knowledge deficit related to smoking impact on wound healing Knowledge deficit related to ulceration/compromised skin integrity Goals: Ulcer/skin breakdown will have a volume reduction of 30% by week 4 Date Initiated: 04/04/2020 Target Resolution Date: 04/18/2020 Goal Status: Active Interventions: Assess patient/caregiver ability to obtain necessary supplies Assess ulceration(s) every visit Jeffrey Morse, Jeffrey Morse ([id number]) Treatment Activities: Topical wound management initiated : 04/04/2020 Notes: Electronic Signature(s) Signed: 04/25/2020 6:23:16 PM By: Elliot Gurney, BSN, RN, CWS, Kim RN, BSN Entered By: Elliot Gurney, BSN, RN, CWS, Kim on 04/25/2020 09:51:44 Jeffrey Morse  ([id number]) -------------------------------------------------------------------------------- Pain Assessment Details Patient Name: Jeffrey Morse Date of Service: 04/25/2020 9:15 AM Medical Record Number: [id number] Patient Account Number: Jeffrey Morse Date of Birth/Sex: Mar 05, 1955 (64 y.o. M) Treating RN: Rogers Blocker Primary Care Djuana L[place]leton: Blair Heys Other Clinician: Referring Chari Parmenter: Blair Heys Treating Giovannie Scerbo/Extender: Altamese [place] in Treatment: 3 Active Problems Location of Pain Severity and Description of Pain Patient Has Paino No Site Locations Rate the pain. Current Pain Level: 0 Pain Management and Medication Current Pain Management: Electronic Signature(s) Signed: 04/25/2020 4:40:52 PM By: Phillis Haggis, Dondra Prader RN Entered By: Phillis Haggis, Dondra Prader on 04/25/2020 09:31:11 Jeffrey Morse ([id number]) -------------------------------------------------------------------------------- Patient/Caregiver Education Details Patient Name: Jeffrey Morse Date of Service: 04/25/2020 9:15 AM Medical Record Number: [id number] Patient Account Number: Jeffrey Morse Date of Birth/Gender: 02-Sep-1955 (64 y.o. M) Treating RN: Huel Coventry Primary Care Physician: Blair Heys Other Clinician: Referring Physician: Blair Heys Treating Physician/Extender: Altamese [place] in Treatment: 3 Education Assessment Education Provided To: Patient Education Topics  Provided Wound/Skin Impairment: Handouts: Caring for Your Ulcer, Other: continue wound care as prescribed Methods: Demonstration, Explain/Verbal Responses: State content correctly Electronic Signature(s) Signed: 04/25/2020 6:23:16 PM By: Elliot Gurney, BSN, RN, CWS, Kim RN, BSN Entered By: Elliot Gurney, BSN, RN, CWS, Kim on 04/25/2020 09:55:57 Jeffrey Morse ([id number]) -------------------------------------------------------------------------------- Wound Assessment Details Patient Name: Jeffrey Morse Date of Service: 04/25/2020 9:15 AM Medical Record Number: [id number] Patient Account Number: Jeffrey Morse Date of Birth/Sex: September 25, 1955 (64 y.o. M) Treating RN: Rogers Blocker Primary Care Denya Buckingham: Blair Heys Other Clinician: Referring Corona Popovich: Blair Heys Treating Ferrell Flam/Extender: Altamese [place] in Treatment: 3 Wound Status Wound Number: 1 Primary Atypical Etiology: Wound Location: Head - occiput Wound Status: Open Wounding Event: Pressure Injury Comorbid Congestive Heart Failure, Hypertension, Myocardial Date Acquired: 03/06/2020 History: Infarction, Type II Diabetes Weeks Of Treatment: 3 Clustered Wound: No Photos Wound Measurements Length: (cm) 1.6 Width: (cm) 1.1 Depth: (cm) 0.1 Area: (cm) 1.382 Volume: (cm) 0.138 % Reduction in Area: 85.6% % Reduction in Volume: 85.7% Epithelialization: Medium (34-66%) Tunneling: No Undermining: No Wound Description Classification: Full Thickness Without Exposed Support Structu Wound Margin: Flat and Intact Exudate Amount: Medium Exudate Type: Sanguinous Exudate Color: red res Foul Odor After Cleansing: No Slough/Fibrino Yes Wound Bed Granulation Amount: Large (67-100%) Exposed Structure Granulation Quality: Red, Friable Fascia Exposed: No Necrotic Amount: None Present (0%) Fat Layer (Subcutaneous Tissue) Exposed: Yes Tendon Exposed: No Muscle Exposed: No Joint Exposed: No Bone Exposed: No Treatment Notes Wound #1 (Head - occiput) Cleanser Peri-Wound Care Topical Jeffrey Morse, Jeffrey Morse ([id number]) Primary Dressing Hydrofera Blue Ready Transfer Foam, 2.5x2.5 (in/in) Quantity: 1 Discharge Instruction: Apply Hydrofera Blue Ready to wound bed as directed Secondary Dressing Gauze Discharge Instruction: As directed: dry, moistened with saline or moistened with Dakins Solution Secured With Tegaderm Film Transparent 4x4.75 (in/in) Quantity: 1 Discharge Instruction: Apply to wound bed Compression  Wrap Compression Stockings Add-Ons Electronic Signature(s) Signed: 04/25/2020 4:40:52 PM By: Phillis Haggis, Dondra Prader RN Entered By: Phillis Haggis, Dondra Prader on 04/25/2020 09:34:58 Jeffrey Morse ([id number]) -------------------------------------------------------------------------------- Vitals Details Patient Name: Jeffrey Morse Date of Service: 04/25/2020 9:15 AM Medical Record Number: [id number] Patient Account Number: Jeffrey Morse Date of Birth/Sex: 28-Mar-1955 (64 y.o. M) Treating RN: Rogers Blocker Primary Care Honor Fairbank: Blair Heys Other Clinician: Referring Anari Evitt: Blair Heys Treating Everley Evora/Extender: Altamese [place] in Treatment: 3 Vital Signs Time Taken: 09:28 Temperature (F): 98.1 Height (in): 71 Pulse (bpm): 67 Weight (lbs): 198 Respiratory Rate (breaths/min): 18 Body Mass Index (BMI): 27.6 Blood Pressure (mmHg): 122/85 Reference Range: 80 - 120 mg / dl Electronic Signature(s) Signed: 04/25/2020 4:40:52 PM By: Phillis Haggis, Dondra Prader RN Entered By: Phillis Haggis, Dondra Prader on 04/25/2020 09:31:01

## 2020-04-30 ENCOUNTER — Ambulatory Visit (HOSPITAL_COMMUNITY)
Admission: RE | Admit: 2020-04-30 | Discharge: 2020-04-30 | Disposition: A | Discharge status: Home / Self Care | Payer: BC Managed Care – PPO | Source: Ambulatory Visit | Attending: Adult Health | Admitting: Adult Health

## 2020-04-30 ENCOUNTER — Encounter (HOSPITAL_COMMUNITY): Payer: Self-pay

## 2020-04-30 ENCOUNTER — Other Ambulatory Visit: Payer: Self-pay

## 2020-04-30 VITALS — BP 150/100 | HR 68 | Wt 200.2 lb

## 2020-04-30 DIAGNOSIS — Z79899 Other long term (current) drug therapy: Secondary | ICD-10-CM | POA: Diagnosis not present

## 2020-04-30 DIAGNOSIS — I252 Old myocardial infarction: Secondary | ICD-10-CM | POA: Diagnosis not present

## 2020-04-30 DIAGNOSIS — M545 Low back pain, unspecified: Secondary | ICD-10-CM | POA: Insufficient documentation

## 2020-04-30 DIAGNOSIS — Z7984 Long term (current) use of oral hypoglycemic drugs: Secondary | ICD-10-CM | POA: Insufficient documentation

## 2020-04-30 DIAGNOSIS — I951 Orthostatic hypotension: Secondary | ICD-10-CM | POA: Diagnosis not present

## 2020-04-30 DIAGNOSIS — Z87891 Personal history of nicotine dependence: Secondary | ICD-10-CM | POA: Insufficient documentation

## 2020-04-30 DIAGNOSIS — I2510 Atherosclerotic heart disease of native coronary artery without angina pectoris: Secondary | ICD-10-CM | POA: Diagnosis not present

## 2020-04-30 DIAGNOSIS — I110 Hypertensive heart disease with heart failure: Secondary | ICD-10-CM | POA: Insufficient documentation

## 2020-04-30 DIAGNOSIS — R7303 Prediabetes: Secondary | ICD-10-CM | POA: Insufficient documentation

## 2020-04-30 DIAGNOSIS — I469 Cardiac arrest, cause unspecified: Secondary | ICD-10-CM | POA: Insufficient documentation

## 2020-04-30 DIAGNOSIS — Z955 Presence of coronary angioplasty implant and graft: Secondary | ICD-10-CM | POA: Diagnosis not present

## 2020-04-30 DIAGNOSIS — I5022 Chronic systolic (congestive) heart failure: Secondary | ICD-10-CM | POA: Diagnosis not present

## 2020-04-30 DIAGNOSIS — Z7982 Long term (current) use of aspirin: Secondary | ICD-10-CM | POA: Diagnosis not present

## 2020-04-30 DIAGNOSIS — Z8673 Personal history of transient ischemic attack (TIA), and cerebral infarction without residual deficits: Secondary | ICD-10-CM | POA: Diagnosis not present

## 2020-04-30 LAB — BASIC METABOLIC PANEL
Anion gap: 6 (ref 5–15)
BUN: 12 mg/dL (ref 8–23)
CO2: 25 mmol/L (ref 22–32)
Calcium: 9.8 mg/dL (ref 8.9–10.3)
Chloride: 106 mmol/L (ref 98–111)
Creatinine, Ser: 0.78 mg/dL (ref 0.61–1.24)
GFR, Estimated: >60 mL/min (ref >60)
Glucose, Bld: 83 mg/dL (ref 70–99)
Potassium: 4.4 mmol/L (ref 3.5–5.1)
Sodium: 137 mmol/L (ref 135–145)

## 2020-04-30 MED ORDER — ENTRESTO 24-26 MG PO TABS: 1.0000 | ORAL_TABLET | Freq: Two times a day (BID) | ORAL | 6 refills | Status: DC

## 2020-04-30 NOTE — Progress Notes (Signed)
 Advanced Heart Failure Clinic Note   PCP: Dr. Ehinger HF Cardiologist: Dr. Bensimhon  Reason for Visit: F/u for chronic systolic heart failure   HPI: Mr.Showersis a 64-year-old male with no prior cardiac history.   Presented to UC 02/15/2020 with chest pain where he collapsed CPR initiated. CPR was done for 15 minutes. He lost pulse again went to PEA arrest CPR resumed.  Echo with severely reduced LV systolic function no evidence of pericardial effusion.  EKG with ST elevation with lateral involvement. Emergent cardiac catheterization showed totally occluded mid LCx treated with PCI & Impella placed for cardiogenic shock. Recurrent VT after cardiac cath, defibrillated x1, maintained on amiodarone. Limited echo repeated 1/14 and EF had improved, 50-55%. RV normal.  Neurology consulted 02/17/2020 for left-sided weakness. EEG negative. 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 Maintained on aspirin and Plavix for CVA prophylaxis. Venous Doppler studies negative. He was discharged to CIR for on-going therapy. Discharge weight was 207 lbs.  He was seen back for post hospital f/u on 03/16/20 and was doing well, NYHA Class II. BP was stable w/o orthostatic symptoms. Midodrine was discontinued. Had return f/u w/ pharmD on 3/7 for further med titration and Jardiance 10 mg daily was added. Metformin discontinued.   He returns today for f/u. Here w/ wife. Tolerating Jardiance ok. No side effects. Has been checking BP reguarlly at home. No hypotension and no orthostatic symptoms. BP at home runs ~117-120s systolic and ~80 diastolic. BP elevated today at 150/100, which he attributes to recent worsening low back pain (lumbar disc disease). He denies CP. No resting dyspnea but SOB walking up stairs. His wife also has noticed he is winded climbing stairs. His wt is up 5 lb. ReDs Clip elevated at 40%. Wt at home had been ~195 lb, but elevated today at 200 lb. Reports  full med compliance. Admits to dietary indiscretion w/ sodium.   Cardiac Studies: L/RHC 02/15/20:  PCI/DES to Left Circ, Impella - Mildly elevated filling pressures, mild pulmonary hypertension and normal cardiac output.   RVSP: 39 mm Hg RVDP: 12 mmHG PASP: 38 mmHg PADP: 24 mmHg PA mean: 29 mmHg LVSP: 125 mmHg LVDP: 18 mmHg Fick CO/CI: 6.18/2.83  Echo (02/16/20): EF ~50-55% LV small RV ok.  ROS: All systems negative except as listed in HPI, PMH and Problem List.  SH:  Social History   Socioeconomic History  . Marital status: Married    Spouse name: Not on file  . Number of children: Not on file  . Years of education: Not on file  . Highest education level: Not on file  Occupational History  . Not on file  Tobacco Use  . Smoking status: Former Smoker  . Smokeless tobacco: Never Used  . Tobacco comment: social smoker in high School  Vaping Use  . Vaping Use: Never used  Substance and Sexual Activity  . Alcohol use: Not Currently  . Drug use: Never  . Sexual activity: Yes  Other Topics Concern  . Not on file  Social History Narrative   ** Merged History Encounter **       Social Determinants of Health   Financial Resource Strain: Not on file  Food Insecurity: Not on file  Transportation Needs: Not on file  Physical Activity: Not on file  Stress: Not on file  Social Connections: Not on file  Intimate Partner Violence: Not on file   FH:  Family History  Problem Relation Age of Onset  .   Colon cancer Mother   . Stroke Father    Past Medical History:  Diagnosis Date  . HTN (hypertension)   . Hypercholesteremia   . Hypertension    Borderline hypertension  . Sleep apnea   . Stroke (HCC)     Current Outpatient Medications  Medication Sig Dispense Refill  . acetaminophen (TYLENOL) 325 MG tablet Take 2 tablets (650 mg total) by mouth every 4 (four) hours as needed for mild pain (or temp > 37.5 C (99.5 F)).    . Aspirin 81 MG CAPS 1 capsule    . blood  glucose meter kit and supplies Dispense based on patient and insurance preference. Use up to four times daily as directed. (FOR ICD-10 E10.9, E11.9). 1 each 0  . carvedilol (COREG) 3.125 MG tablet Take 1 tablet (3.125 mg total) by mouth 2 (two) times daily with a meal. 60 tablet 11  . clopidogrel (PLAVIX) 75 MG tablet Take 1 tablet (75 mg total) by mouth daily. 30 tablet 11  . empagliflozin (JARDIANCE) 10 MG TABS tablet Take 1 tablet (10 mg total) by mouth daily. 30 tablet 11  . escitalopram (LEXAPRO) 20 MG tablet Take 20 mg by mouth daily.    . Multiple Vitamin (MULTI VITAMIN) TABS     . omeprazole (PRILOSEC) 20 MG capsule Take 1 capsule (20 mg total) by mouth daily. 30 capsule 0  . rosuvastatin (CRESTOR) 40 MG tablet Take 1 tablet (40 mg total) by mouth daily. 30 tablet 11  . sacubitril-valsartan (ENTRESTO) 24-26 MG Take 1 tablet by mouth 2 (two) times daily. 60 tablet 6  . spironolactone (ALDACTONE) 25 MG tablet Take 0.5 tablets (12.5 mg total) by mouth daily. 15 tablet 11   No current facility-administered medications for this encounter.    Vitals:   04/30/20 1122  BP: (!) 150/100  Pulse: 68  SpO2: 98%  Weight: 90.8 kg (200 lb 3.2 oz)   Wt Readings from Last 3 Encounters:  04/30/20 90.8 kg (200 lb 3.2 oz)  04/24/20 90.3 kg (199 lb)  04/10/20 90.3 kg (199 lb)   PHYSICAL EXAM: ReDs Clip 40%  General:  Well appearing. No respiratory difficulty HEENT: normal Neck: supple. JVD 8 cm. Carotids 2+ bilat; no bruits. No lymphadenopathy or thyromegaly appreciated. Cor: PMI nondisplaced. Regular rate & rhythm. No rubs, gallops or murmurs. Lungs: clear Abdomen: soft, nontender, nondistended. No hepatosplenomegaly. No bruits or masses. Good bowel sounds. Extremities: no cyanosis, clubbing, rash, edema Neuro: alert & oriented x 3, cranial nerves grossly intact. moves all 4 extremities w/o difficulty. Affect pleasant.   ECG: NSR LAFB, 63 bpm   ASSESSMENT & PLAN: 1. H/O Cardiac (VT arrest  in setting of acute inferolateral STEMI) - OOH cardiac arrest on 02/15/20 with prolonged CPR, resuscitated w/ CPR + defibrillation  - Emergent cath 02/15/20 w/ totally occluded mid LCx treated w/ PCI + DES - Recurrent VT on 1/14 w/ defib x1 - Now off amiodarone. No syncope/ near syncope.  - Continue carvedilol 3.125 mg bid.  2. 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 RV normal - NYHA Class III. Mildly fluid overloaded on exam. ReDs Clip 40% - Add Entresto 24-26 mg bid   - Continue spironolactone 12.5 mg daily. - Continue Jardiance 10 mg daily  - Continue Coreg 3.125 mg bid  - Check BMP today and again in 7 days     3. CAD s/p STEMI  - Emergent cath 1/12 w/ totally occluded mid LCx treated w/ PCI + DES -There is moderate ostial LAD disease and significant disease in the distal LAD close to the apex. No significant disease affecting the right coronary artery. - Stable w/o CP  - Continue DAPT + rosuvastatin. -Continue carvedilol.  4. H/o CVA with ICH - MRI 1/22 with multiple small infarcts and small ICH (Likely cardioembolic from arrest). - L sided weakness resolved.  5.Orthostatic Hypotension - Resolved. No further symptoms - BP stable off midodrine - Per above, we will try low dose Entresto, Pt advised to monitor closely for hypotension/ Orthostatic symptoms   6. Pre-diabetes  - HgbA1c 6.0. - Metformin recently discontinued - now on Jardiance 10 mg daily for CHF - repeat Hgb A1c in 3 months   F/u BMP in 1 week. F/u w/ pharmD in 2-3 weeks   Lyda Jester, PA-C   04/30/20

## 2020-04-30 NOTE — Progress Notes (Signed)
ReDS Vest / Clip - 04/30/20 1100      ReDS Vest / Clip   Station Marker D    Ruler Value 33    ReDS Value Range High volume overload    ReDS Actual Value 40    Anatomical Comments sitting

## 2020-04-30 NOTE — Patient Instructions (Signed)
RESTART Entresto 24/26 mg one tab twice a day  Labs today We will only contact you if something comes back abnormal or we need to make some changes. Otherwise no news is good news!  Your physician recommends that you schedule a follow-up appointment in: 1 week for labs and 6-8 weeks  in the [facility] ([facility]NP) [facility]   Do the following things EVERYDAY: 1) Weigh yourself in the morning before breakfast. Write it down and keep it in a log. 2) Take your medicines as prescribed 3) Eat low salt foods--Limit salt (sodium) to 2000 mg per day.  4) Stay as active as you can everyday 5) Limit all fluids for the day to less than 2 liters   At the [facility], you and your health needs are our priority. As part of our continuing mission to provide you with exceptional heart care, we have created designated Provider Care Teams. These Care Teams include your primary Cardiologist (physician) and Advanced Practice Providers (APPs- Physician Assistants and Nurse Practitioners) who all work together to provide you with the care you need, when you need it.   You may see any of the following providers on your designated Care Team at your next follow up: Marland Kitchen Dr Arvilla Meres . Dr Marca Ancona . Dr Thornell Mule . Tonye Becket, NP . Robbie Lis, PA . Shanda Bumps [place],NP . Karle Plumber, PharmD   Please be sure to bring in all your medications bottles to every appointment.   If you have any questions or concerns before your next appointment please send [place] a message through [place] or call our office at [phone].    TO LEAVE A MESSAGE FOR THE NURSE SELECT OPTION 2, PLEASE LEAVE A MESSAGE INCLUDING: . YOUR NAME . DATE OF BIRTH . CALL BACK NUMBER . REASON FOR CALL**this is important as we prioritize the call backs  YOU WILL RECEIVE A CALL BACK THE SAME DAY AS LONG AS YOU CALL BEFORE 4:00 PM

## 2020-05-01 ENCOUNTER — Telehealth (HOSPITAL_COMMUNITY): Payer: Self-pay | Admitting: Pharmacy Technician

## 2020-05-01 ENCOUNTER — Encounter (HOSPITAL_BASED_OUTPATIENT_CLINIC_OR_DEPARTMENT_OTHER): Payer: BC Managed Care – PPO | Admitting: Internal Medicine

## 2020-05-01 NOTE — Telephone Encounter (Signed)
Patient Advocate Encounter   Received notification from [employer] that prior authorization for Sherryll Burger is required.   PA submitted on CoverMyMeds Key [id number] Status is pending   Will continue to follow.

## 2020-05-01 NOTE — Telephone Encounter (Signed)
Advanced [facility] Failure Patient Advocate Encounter  Prior Authorization for Sherryll Burger has been approved.    PA# [id number] Effective dates: 05/01/20 through 05/01/21  Patients co-pay is $0  Archer Asa, CPhT

## 2020-05-08 ENCOUNTER — Ambulatory Visit (HOSPITAL_COMMUNITY)
Admission: RE | Admit: 2020-05-08 | Discharge: 2020-05-08 | Disposition: A | Discharge status: Home / Self Care | Payer: BC Managed Care – PPO | Source: Ambulatory Visit | Attending: Cardiology | Admitting: Cardiology

## 2020-05-08 ENCOUNTER — Other Ambulatory Visit: Payer: Self-pay

## 2020-05-08 DIAGNOSIS — I5022 Chronic systolic (congestive) heart failure: Secondary | ICD-10-CM | POA: Insufficient documentation

## 2020-05-08 LAB — BASIC METABOLIC PANEL
Anion gap: 5 (ref 5–15)
BUN: 15 mg/dL (ref 8–23)
CO2: 26 mmol/L (ref 22–32)
Calcium: 9.8 mg/dL (ref 8.9–10.3)
Chloride: 106 mmol/L (ref 98–111)
Creatinine, Ser: 0.86 mg/dL (ref 0.61–1.24)
GFR, Estimated: >60 mL/min (ref >60)
Glucose, Bld: 114 mg/dL — ABNORMAL HIGH (ref 70–99)
Potassium: 4.4 mmol/L (ref 3.5–5.1)
Sodium: 137 mmol/L (ref 135–145)

## 2020-05-09 ENCOUNTER — Encounter: Payer: BC Managed Care – PPO | Attending: Internal Medicine | Admitting: Internal Medicine

## 2020-05-09 DIAGNOSIS — E1151 Type 2 diabetes mellitus with diabetic peripheral angiopathy without gangrene: Secondary | ICD-10-CM | POA: Diagnosis not present

## 2020-05-09 DIAGNOSIS — S0001XA Abrasion of scalp, initial encounter: Secondary | ICD-10-CM | POA: Insufficient documentation

## 2020-05-09 DIAGNOSIS — X58XXXA Exposure to other specified factors, initial encounter: Secondary | ICD-10-CM | POA: Insufficient documentation

## 2020-05-09 DIAGNOSIS — L98498 Non-pressure chronic ulcer of skin of other sites with other specified severity: Secondary | ICD-10-CM | POA: Insufficient documentation

## 2020-05-09 DIAGNOSIS — I110 Hypertensive heart disease with heart failure: Secondary | ICD-10-CM | POA: Insufficient documentation

## 2020-05-09 DIAGNOSIS — E11622 Type 2 diabetes mellitus with other skin ulcer: Secondary | ICD-10-CM | POA: Diagnosis not present

## 2020-05-09 DIAGNOSIS — Z7902 Long term (current) use of antithrombotics/antiplatelets: Secondary | ICD-10-CM | POA: Diagnosis not present

## 2020-05-09 DIAGNOSIS — I509 Heart failure, unspecified: Secondary | ICD-10-CM | POA: Diagnosis not present

## 2020-05-10 NOTE — Progress Notes (Addendum)
Jeffrey Morse ([id number]) Visit Report for 05/09/2020 Arrival Information Details Patient Name: Jeffrey Morse, Jeffrey Morse Date of Service: 05/09/2020 9:00 AM Medical Record Number: [id number] Patient Account Number: [id number] Date of Birth/Sex: [birth year] (64 y.o. M) Treating RN: Jeffrey Morse Primary Care Jeffrey Morse: Jeffrey Morse Other Clinician: Lolita Morse Referring Lucianne Smestad: Jeffrey Morse Treating Jeffrey Morse: Jeffrey Morse in Treatment: 5 Visit Information History Since Last Visit Added or deleted any medications: No Patient Arrived: Ambulatory Had a fall or experienced change in No Arrival Time: 09:09 activities of daily living that may affect Accompanied By: self risk of falls: Transfer Assistance: None Hospitalized since last visit: No Patient Identification Verified: Yes Pain Present Now: No Secondary Verification Process Completed: Yes Patient Has Alerts: Yes Patient Alerts: Patient on Blood Thinner Type II Diabetic Electronic Signature(s) Signed: 05/09/2020 4:53:25 PM By: Jeffrey Morse Entered By: Jeffrey Morse on 05/09/2020 09:11:07 Jeffrey Morse ([id number]) -------------------------------------------------------------------------------- Clinic Level of Care Assessment Details Patient Name: Jeffrey Morse Date of Service: 05/09/2020 9:00 AM Medical Record Number: [id number] Patient Account Number: [id number] Date of Birth/Sex: [birth year] (64 y.o. M) Treating RN: Jeffrey Morse Primary Care Jeffrey Morse: Jeffrey Morse Other Clinician: Lolita Morse Referring Jeffrey Morse: Jeffrey Morse Treating Jeffrey Morse: Jeffrey Morse in Treatment: 5 Clinic Level of Care Assessment Items TOOL 4 Quantity Score []  - Use when only an EandM is performed on FOLLOW-UP visit 0 ASSESSMENTS - Nursing Assessment / Reassessment []  - Reassessment of Co-morbidities (includes updates in patient status) 0 []  - 0 Reassessment of Adherence to Treatment  Plan ASSESSMENTS - Wound and Skin Assessment / Reassessment X - Simple Wound Assessment / Reassessment - one wound 1 5 []  - 0 Complex Wound Assessment / Reassessment - multiple wounds []  - 0 Dermatologic / Skin Assessment (not related to wound area) ASSESSMENTS - Focused Assessment []  - Circumferential Edema Measurements - multi extremities 0 []  - 0 Nutritional Assessment / Counseling / Intervention []  - 0 Lower Extremity Assessment (monofilament, tuning fork, pulses) []  - 0 Peripheral Arterial Disease Assessment (using hand held doppler) ASSESSMENTS - Ostomy and/or Continence Assessment and Care []  - Incontinence Assessment and Management 0 []  - 0 Ostomy Care Assessment and Management (repouching, etc.) PROCESS - Coordination of Care []  - Simple Patient / Family Education for ongoing care 0 []  - 0 Complex (extensive) Patient / Family Education for ongoing care []  - 0 Staff obtains , Records, Test Results / Process Orders []  - 0 Staff telephones HHA, Nursing Homes / Clarify orders / etc []  - 0 Routine Transfer to another Facility (non-emergent condition) []  - 0 Routine Hospital Admission (non-emergent condition) []  - 0 New Admissions / / Ordering NPWT, Apligraf, etc. []  - 0 Emergency Hospital Admission (emergent condition) X- 1 10 Simple Discharge Coordination []  - 0 Complex (extensive) Discharge Coordination PROCESS - Special Needs []  - Pediatric / Minor Patient Management 0 []  - 0 Isolation Patient Management []  - 0 Hearing / Language / Visual special needs []  - 0 Assessment of Community assistance (transportation, D/C planning, etc.) []  - 0 Additional assistance / Altered mentation []  - 0 Support Surface(s) Assessment (bed, cushion, seat, etc.) INTERVENTIONS - Wound Cleansing / Measurement Jeffrey Morse ( ) []  - 0 Simple Wound Cleansing - one wound []  - 0 Complex Wound Cleansing - multiple wounds X- 1 5 Wound  Imaging (photographs - any number of wounds) []  - 0 Wound Tracing (instead of photographs) []  - 0 Simple Wound Measurement - one wound []  - 0 Complex Wound  Measurement - multiple wounds INTERVENTIONS - Wound Dressings []  - Small Wound Dressing one or multiple wounds 0 []  - 0 Medium Wound Dressing one or multiple wounds []  - 0 Large Wound Dressing one or multiple wounds []  - 0 Application of Medications - topical []  - 0 Application of Medications - injection INTERVENTIONS - Miscellaneous []  - External ear exam 0 []  - 0 Specimen Collection (cultures, biopsies, blood, body fluids, etc.) []  - 0 Specimen(s) / Culture(s) sent or taken to Lab for analysis []  - 0 Patient Transfer (multiple staff / Lift / Similar devices) []  - 0 Simple Staple / Suture removal (25 or less) []  - 0 Complex Staple / Suture removal (26 or more) []  - 0 Hypo / Hyperglycemic Management (close monitor of Blood Glucose) []  - 0 Ankle / Brachial Index (ABI) - do not check if billed separately X- 1 5 Vital Signs Has the patient been seen at the hospital within the last three years: Yes Total Score: 25 Level Of Care: New/Established - Level 1 Electronic Signature(s) Signed: 05/09/2020 5:25:07 PM By: , BSN, RN, CWS, Kim RN, BSN Entered By: , BSN, RN, CWS, Kim on 05/09/2020 09:34:13 ( ) -------------------------------------------------------------------------------- Encounter Discharge Information Details Patient Name: Date of Service: 05/09/2020 9:00 AM Medical Record Number: Michiel Sites Patient Account Number: Date of Birth/Sex: 1955-04-22 (64 y.o. M) Treating RN: Primary Care Kameisha Malicki: 07/09/2020 Other Clinician: Elliot Gurney Referring Edwardo Wojnarowski: Elliot Gurney Treating Kayman Snuffer/Extender: 07/09/2020 in Treatment: 5 Encounter Discharge Information Items Discharge Condition: Stable Ambulatory Status:  Ambulatory Discharge Destination: Home Transportation: Private Auto Accompanied By: self Schedule Follow-up Appointment: No Clinical Summary of Care: Electronic Signature(s) Signed: 05/09/2020 5:25:07 PM By: [id number], BSN, RN, CWS, Kim RN, BSN Entered By: Jeffrey Morse, BSN, RN, CWS, Kim on 05/09/2020 09:48:24 [id number] ([id number]) -------------------------------------------------------------------------------- Lower Extremity Assessment Details Patient Name: Jeffrey Morse Date of Service: 05/09/2020 9:00 AM Medical Record Number: Jeffrey Morse Patient Account Number: Jeffrey Morse Date of Birth/Sex: Feb 15, 1955 (64 y.o. M) Treating RN: Jeffrey  Primary Care Bert Ptacek: 07/09/2020 Other Clinician: Elliot Gurney Referring Azani Brogdon: Elliot Gurney Treating Emoni Whitworth/Extender: 07/09/2020 in Treatment: 5 Electronic Signature(s) Signed: 05/09/2020 4:53:25 PM By: [id number] Signed: 05/09/2020 5:25:07 PM By: 07/09/2020, BSN, RN, CWS, Kim RN, BSN Entered By: [id number] on 05/09/2020 09:11:46 Jeffrey Morse (09-13-1998) -------------------------------------------------------------------------------- Multi Wound Chart Details Patient Name: Jeffrey Morse Date of Service: 05/09/2020 9:00 AM Medical Record Number: Jeffrey Morse Patient Account Number: Jeffrey Morse Date of Birth/Sex: Nov 11, 1955 (64 y.o. M) Treating RN: Jeffrey Morse Primary Care Shayden Bobier: 07/09/2020 Other Clinician: Elliot Gurney Referring Shelvy Heckert: Jeffrey Morse Treating Kenna Kirn/Extender: 07/09/2020 in Treatment: 5 Vital Signs Height(in): 71 Pulse(bpm): 68 Weight(lbs): 198 Blood Pressure(mmHg): 132/84 Body Mass Index(BMI): 28 Temperature(F): 98.5 Respiratory Rate(breaths/min): 18 Photos: [N/A:N/A] Wound Location: Head - occiput N/A N/A Wounding Event: Pressure Injury N/A N/A Primary Etiology: Atypical N/A N/A Comorbid History: Congestive Heart Failure, N/A N/A Hypertension,  Myocardial Infarction, Type II Diabetes Date Acquired: 03/06/2020 N/A N/A Weeks of Treatment: 5 N/A N/A Wound Status: Open N/A N/A Measurements L x W x D (cm) 0.1x0.1x0.1 N/A N/A Area (cm) : 0.008 N/A N/A Volume (cm) : 0.001 N/A N/A % Reduction in Area: 99.90% N/A N/A % Reduction in Volume: 99.90% N/A N/A Classification: Full Thickness Without Exposed N/A N/A Support Structures Exudate Amount: None Present N/A N/A Wound Margin: Flat and Intact N/A N/A Granulation Amount: None Present (0%) N/A N/A  Necrotic Amount: None Present (0%) N/A N/A Exposed Structures: Fascia: No N/A N/A Fat Layer (Subcutaneous Tissue): No Tendon: No Muscle: No Joint: No Bone: No Epithelialization: Medium (34-66%) N/A N/A Treatment Notes Wound #1 (Head - occiput) Cleanser Peri-Wound Care Topical Primary Dressing Secondary Dressing RUDIE, SERMONS ([id number]) Secured With Compression Wrap Compression Stockings Add-Ons Electronic Signature(s) Signed: 05/10/2020 7:59:07 AM By: Baltazar Najjar MD Entered By: Baltazar Najjar on 05/09/2020 10:01:28 Jeffrey Morse ([id number]) -------------------------------------------------------------------------------- Multi-Disciplinary Care Plan Details Patient Name: Jeffrey Morse Date of Service: 05/09/2020 9:00 AM Medical Record Number: [id number] Patient Account Number: [id number] Date of Birth/Sex: [birth year] (64 y.o. M) Treating RN: Jeffrey Morse Primary Care Hazaiah [place]: Jeffrey Morse Other Clinician: Lolita Morse Referring Sequan Auxier: Jeffrey Morse Treating Jemeka Wagler/Extender: Jeffrey [place] in Treatment: 5 Active Inactive Electronic Signature(s) Signed: 05/09/2020 5:25:07 PM By: Elliot Gurney, BSN, RN, CWS, Kim RN, BSN Entered By: Elliot Gurney, BSN, RN, CWS, Kim on 05/09/2020 09:32:29 Jeffrey Morse ([id number]) -------------------------------------------------------------------------------- Pain Assessment Details Patient Name: Jeffrey Morse Date of Service: 05/09/2020 9:00 AM Medical Record Number: [id number] Patient Account Number: [id number] Date of Birth/Sex: 19-Nov-1955 (64 y.o. M) Treating RN: Jeffrey Morse Primary Care Melenie Minniear: Jeffrey Morse Other Clinician: Lolita Morse Referring Romario Tith: Jeffrey Morse Treating Glynis Hunsucker/Extender: Jeffrey [place] in Treatment: 5 Active Problems Location of Pain Severity and Description of Pain Patient Has Paino No Site Locations Rate the pain. Current Pain Level: 0 Pain Management and Medication Current Pain Management: Electronic Signature(s) Signed: 05/09/2020 4:53:25 PM By: Jeffrey Morse Signed: 05/09/2020 5:25:07 PM By: Elliot Gurney, BSN, RN, CWS, Kim RN, BSN Entered By: Jeffrey Morse on 05/09/2020 09:11:37 Jeffrey Morse ([id number]) -------------------------------------------------------------------------------- Patient/Caregiver Education Details Patient Name: Jeffrey Morse Date of Service: 05/09/2020 9:00 AM Medical Record Number: [id number] Patient Account Number: [id number] Date of Birth/Gender: 1955/12/11 (64 y.o. M) Treating RN: Jeffrey Morse Primary Care Physician: Jeffrey Morse Other Clinician: Lolita Morse Referring Physician: Blair Morse Treating Physician/Extender: Jeffrey [place] in Treatment: 5 Education Assessment Education Provided To: Patient Education Topics Provided Notes healed, treatment complete Electronic Signature(s) Signed: 05/09/2020 5:25:07 PM By: Elliot Gurney, BSN, RN, CWS, Kim RN, BSN Entered By: Elliot Gurney, BSN, RN, CWS, Kim on 05/09/2020 09:47:41 Jeffrey Morse ([id number]) -------------------------------------------------------------------------------- Wound Assessment Details Patient Name: Jeffrey Morse Date of Service: 05/09/2020 9:00 AM Medical Record Number: [id number] Patient Account Number: [id number] Date of Birth/Sex: 08/04/1955 (64 y.o. M) Treating RN: Jeffrey Morse Primary Care Nicholson Starace: Jeffrey Morse Other Clinician: Lolita Morse Referring Iantha Titsworth: Jeffrey Morse Treating Briellah Baik/Extender: Jeffrey [place] in Treatment: 5 Wound Status Wound Number: 1 Primary Atypical Etiology: Wound Location: Head - occiput Wound Status: Healed - Epithelialized Wounding Event: Pressure Injury Comorbid Congestive Heart Failure, Hypertension, Myocardial Date Acquired: 03/06/2020 History: Infarction, Type II Diabetes Weeks Of Treatment: 5 Clustered Wound: No Photos Wound Measurements Length: (cm) 0 Width: (cm) 0 Depth: (cm) 0 Area: (cm) 0 Volume: (cm) 0 % Reduction in Area: 100% % Reduction in Volume: 100% Epithelialization: Large (67-100%) Tunneling: No Undermining: No Wound Description Classification: Full Thickness Without Exposed Support Structures Wound Margin: Flat and Intact Exudate Amount: None Present Foul Odor After Cleansing: No Slough/Fibrino No Wound Bed Granulation Amount: None Present (0%) Exposed Structure Necrotic Amount: None Present (0%) Fascia Exposed: No Fat Layer (Subcutaneous Tissue) Exposed: No Tendon Exposed: No Muscle Exposed: No Joint Exposed: No Bone Exposed: No Treatment Notes Wound #1 (Head - occiput) Cleanser Peri-Wound Care Topical Primary Dressing Jeffrey Morse, Jeffrey Morse ([id number]) Secondary Dressing Secured With Compression Wrap Compression  Stockings Facilities manager) Signed: 05/11/2020 1:41:38 PM By: Elliot Gurney, BSN, RN, CWS, Kim RN, BSN Previous Signature: 05/09/2020 4:53:25 PM Version By: Jeffrey Morse Previous Signature: 05/09/2020 5:25:07 PM Version By: Elliot Gurney, BSN, RN, CWS, Kim RN, BSN Entered By: Elliot Gurney, BSN, RN, CWS, Kim on 05/11/2020 13:41:38 Jeffrey Morse, Jeffrey Morse ([id number]) -------------------------------------------------------------------------------- Vitals Details Patient Name: Jeffrey Morse Date of Service: 05/09/2020 9:00 AM Medical Record Number: [id number] Patient Account Number: [id number] Date  of Birth/Sex: 1955-11-19 (64 y.o. M) Treating RN: Jeffrey Morse Primary Care Mashelle Busick: Jeffrey Morse Other Clinician: Lolita Morse Referring Jalon Blackwelder: Jeffrey Morse Treating Merlyn Conley/Extender: Jeffrey [place] in Treatment: 5 Vital Signs Time Taken: 09:05 Temperature (F): 98.5 Height (in): 71 Pulse (bpm): 68 Weight (lbs): 198 Respiratory Rate (breaths/min): 18 Body Mass Index (BMI): 27.6 Blood Pressure (mmHg): 132/84 Reference Range: 80 - 120 mg / dl Electronic Signature(s) Signed: 05/09/2020 4:53:25 PM By: Jeffrey Morse Entered By: Jeffrey Morse on 05/09/2020 09:11:25

## 2020-05-10 NOTE — Progress Notes (Signed)
ALEXIZ, SUSTAITA ([id number]) Visit Report for 05/09/2020 HPI Details Patient Name: Jeffrey Morse, Jeffrey Morse Date of Service: 05/09/2020 9:00 AM Medical Record Number: [id number] Patient Account Number: [id number] Date of Birth/Sex: 11-26-1955 (65 y.o. M) Treating RN: Huel Coventry Primary Care Provider: Blair Heys Other Clinician: Lolita Cram Referring Provider: Blair Heys Treating Provider/Extender: Altamese [place] in Treatment: 5 History of Present Illness HPI Description: ADMISSION 04/04/2020 This is a 65 year old man who presented in January to an urgent care with acute coronary syndrome. He had a cardiac arrest requiring cardioversion and then had further arrest requiring cardioversion on the way to and in the hospital and apparently in the Cath Lab. He had an acute inferior lateral MI. I think somewhere in this he had a right thalamic frontal bleed as well and spent some time in rehab. During the stay in rehab it was discovered that he had a black eschar on his occiput. The nurse told him that she thought it might be a pressure wound. The patient's wife has since remove the eschar since he arrived home. He has a large hyper granulated area over the occiput. He also has an extensive patch of alopecia around this area. No other scalp skin lesions or areas are seen. He arrives with several questions about whether recurrent cardioversion like he suffered could cause a burn injury or perhaps the CT scan. He has been applying iodine and Neosporin Past medical history includes type 2 diabetes, coronary artery disease, lumbar spinal stenosis, hypertension, hypercholesterolemia. He is on Plavix. 04/11/2020 on evaluation today patient appears to be doing well with regard to his wound on the scalp. With that being said this is hypergranular. I did review the surgical pathology that was negative for any type of malignancy there was just inflamed granulation tissue. Fortunately there  does not appear to be any evidence of active infection at this time which is wonderful 3/16; patient I admitted 2 weeks ago with a hyper granulated area on his occiput with surrounding alopecia in the setting of recurrent cardioversion. The wound looks really very odd and in spite of the alopecia I felt it was important to make sure this was not an unrecognized malignancy. The biopsies I did were negative. Wound is measuring smaller. We are using silver alginate and Tegaderm 3/23; wound continues to contract. Using Hydrofera Blue. Slightly hyper granulated 4/6; 2-week follow-up. The wound is healed he used Hydrofera Blue. Initially presented as a sizable hyper granulated area over the occiput. He had surrounding alopecia. Initially I had some concerns about an underlying malignancy however biopsies were negative. I been left of the opinion that he might of had some conduction/burn injuries from at some point in his initial multiple resuscitation efforts for a cardiac arrest. Electronic Signature(s) Signed: 05/10/2020 7:59:07 AM By: Baltazar Najjar MD Entered By: Baltazar Najjar on 05/09/2020 10:02:34 Jeffrey Morse ([id number]) -------------------------------------------------------------------------------- Physical Exam Details Patient Name: Jeffrey Morse Date of Service: 05/09/2020 9:00 AM Medical Record Number: [id number] Patient Account Number: [id number] Date of Birth/Sex: 1955-02-16 (65 y.o. M) Treating RN: Huel Coventry Primary Care Provider: Blair Heys Other Clinician: Lolita Cram Referring Provider: Blair Heys Treating Provider/Extender: Altamese [place] in Treatment: 5 Constitutional Sitting or standing Blood Pressure is within target range for patient.. Pulse regular and within target range for patient.Marland Kitchen Respirations regular, non- labored and within target range.. Temperature is normal and within the target range for the patient.Marland Kitchen appears in no  distress. Notes Wound exam; the patient's wound is totally  epithelialized. Scar tissue no evidence of surrounding infection. A lot of his hair is already filled and although there is still an area of surrounding alopecia Electronic Signature(s) Signed: 05/10/2020 7:59:07 AM By: Baltazar Najjar MD Entered By: Baltazar Najjar on 05/09/2020 10:03:17 Jeffrey Morse ([id number]) -------------------------------------------------------------------------------- Physician Orders Details Patient Name: Jeffrey Morse Date of Service: 05/09/2020 9:00 AM Medical Record Number: [id number] Patient Account Number: [id number] Date of Birth/Sex: 07-22-1955 (65 y.o. M) Treating RN: Huel Coventry Primary Care Provider: Blair Heys Other Clinician: Lolita Cram Referring Provider: Blair Heys Treating Provider/Extender: Altamese [place] in Treatment: 5 Verbal / Phone Orders: No Diagnosis Coding Discharge From [facility] Services o Discharge from Wound Care Center Treatment Complete Wound Treatment Electronic Signature(s) Signed: 05/09/2020 5:25:07 PM By: Elliot Gurney, BSN, RN, CWS, Kim RN, BSN Signed: 05/10/2020 7:59:07 AM By: Baltazar Najjar MD Entered By: Elliot Gurney, BSN, RN, CWS, Kim on 05/09/2020 09:33:17 Jeffrey Morse, Jeffrey Morse ([id number]) -------------------------------------------------------------------------------- Problem List Details Patient Name: Jeffrey Morse Date of Service: 05/09/2020 9:00 AM Medical Record Number: [id number] Patient Account Number: [id number] Date of Birth/Sex: 05/17/1955 (65 y.o. M) Treating RN: Huel Coventry Primary Care Provider: Blair Heys Other Clinician: Lolita Cram Referring Provider: Blair Heys Treating Provider/Extender: Altamese [place] in Treatment: 5 Active Problems ICD-10 Encounter Code Description Active Date MDM Diagnosis L98.498 Non-pressure chronic ulcer of skin of other sites with other specified 04/04/2020 No Yes severity S00.01XD  Abrasion of scalp, subsequent encounter 04/04/2020 No Yes Inactive Problems Resolved Problems Electronic Signature(s) Signed: 05/10/2020 7:59:07 AM By: Baltazar Najjar MD Entered By: Baltazar Najjar on 05/09/2020 10:01:16 Jeffrey Morse ([id number]) -------------------------------------------------------------------------------- Progress Note Details Patient Name: Jeffrey Morse Date of Service: 05/09/2020 9:00 AM Medical Record Number: [id number] Patient Account Number: [id number] Date of Birth/Sex: 1955/08/23 (65 y.o. M) Treating RN: Huel Coventry Primary Care Provider: Blair Heys Other Clinician: Lolita Cram Referring Provider: Blair Heys Treating Provider/Extender: Altamese [place] in Treatment: 5 Subjective History of Present Illness (HPI) ADMISSION 04/04/2020 This is a 65 year old man who presented in January to an urgent care with acute coronary syndrome. He had a cardiac arrest requiring cardioversion and then had further arrest requiring cardioversion on the way to and in the hospital and apparently in the Cath Lab. He had an acute inferior lateral MI. I think somewhere in this he had a right thalamic frontal bleed as well and spent some time in rehab. During the stay in rehab it was discovered that he had a black eschar on his occiput. The nurse told him that she thought it might be a pressure wound. The patient's wife has since remove the eschar since he arrived home. He has a large hyper granulated area over the occiput. He also has an extensive patch of alopecia around this area. No other scalp skin lesions or areas are seen. He arrives with several questions about whether recurrent cardioversion like he suffered could cause a burn injury or perhaps the CT scan. He has been applying iodine and Neosporin Past medical history includes type 2 diabetes, coronary artery disease, lumbar spinal stenosis, hypertension, hypercholesterolemia. He is  on Plavix. 04/11/2020 on evaluation today patient appears to be doing well with regard to his wound on the scalp. With that being said this is hypergranular. I did review the surgical pathology that was negative for any type of malignancy there was just inflamed granulation tissue. Fortunately there does not appear to be any evidence of active infection at this time which is wonderful 3/16; patient I  admitted 2 weeks ago with a hyper granulated area on his occiput with surrounding alopecia in the setting of recurrent cardioversion. The wound looks really very odd and in spite of the alopecia I felt it was important to make sure this was not an unrecognized malignancy. The biopsies I did were negative. Wound is measuring smaller. We are using silver alginate and Tegaderm 3/23; wound continues to contract. Using Hydrofera Blue. Slightly hyper granulated 4/6; 2-week follow-up. The wound is healed he used Hydrofera Blue. Initially presented as a sizable hyper granulated area over the occiput. He had surrounding alopecia. Initially I had some concerns about an underlying malignancy however biopsies were negative. I been left of the opinion that he might of had some conduction/burn injuries from at some point in his initial multiple resuscitation efforts for a cardiac arrest. Objective Constitutional Sitting or standing Blood Pressure is within target range for patient.. Pulse regular and within target range for patient.Marland Kitchen Respirations regular, non- labored and within target range.. Temperature is normal and within the target range for the patient.Marland Kitchen appears in no distress. Vitals Time Taken: 9:05 AM, Height: 71 in, Weight: 198 lbs, BMI: 27.6, Temperature: 98.5 F, Pulse: 68 bpm, Respiratory Rate: 18 breaths/min, Blood Pressure: 132/84 mmHg. General Notes: Wound exam; the patient's wound is totally epithelialized. Scar tissue no evidence of surrounding infection. A lot of his hair is already filled and  although there is still an area of surrounding alopecia Integumentary (Hair, Skin) Wound #1 status is Open. Original cause of wound was Pressure Injury. The date acquired was: 03/06/2020. The wound has been in treatment 5 weeks. The wound is located on the Head - occiput. The wound measures 0.1cm length x 0.1cm width x 0.1cm depth; 0.008cm^2 area and 0.001cm^3 volume. There is no tunneling or undermining noted. There is a none present amount of drainage noted. The wound margin is flat and intact. There is no granulation within the wound bed. There is no necrotic tissue within the wound bed. Assessment Active Problems ICD-10 Non-pressure chronic ulcer of skin of other sites with other specified severity Jeffrey Morse, Jeffrey Morse ([id number]) Abrasion of scalp, subsequent encounter Plan Discharge From [facility] Services: Discharge from Wound Care Center Treatment Complete 1. The patient can be discharged from the clinic. 2 given the nature of his underlying injuries I think secondary prevention is probably unnecessary Electronic Signature(s) Signed: 05/10/2020 7:59:07 AM By: Baltazar Najjar MD Entered By: Baltazar Najjar on 05/09/2020 10:03:47 Jeffrey Morse ([id number]) -------------------------------------------------------------------------------- SuperBill Details Patient Name: Jeffrey Morse Date of Service: 05/09/2020 Medical Record Number: [id number] Patient Account Number: [id number] Date of Birth/Sex: 1955/03/13 (65 y.o. M) Treating RN: Huel Coventry Primary Care Provider: Blair Heys Other Clinician: Lolita Cram Referring Provider: Blair Heys Treating Provider/Extender: Altamese [place] in Treatment: 5 Diagnosis Coding ICD-10 Codes Code Description [phone] Non-pressure chronic ulcer of skin of other sites with other specified severity S00.01XD Abrasion of scalp, subsequent encounter Facility Procedures CPT4 Code: [id number] Description: [phone] - WOUND CARE VISIT-LEV 1  EST PT Modifier: Quantity: 1 Physician Procedures CPT4 Code: [id number] Description: 97026 - WC PHYS LEVEL 2 - EST PT Modifier: Quantity: 1 CPT4 Code: Description: ICD-10 Diagnosis Description L98.498 Non-pressure chronic ulcer of skin of other sites with other specified s S00.01XD Abrasion of scalp, subsequent encounter Modifier: everity Quantity: Electronic Signature(s) Signed: 05/10/2020 7:59:07 AM By: Baltazar Najjar MD Entered By: Baltazar Najjar on 05/09/2020 10:04:06

## 2020-05-28 NOTE — Progress Notes (Incomplete)
***In Progress*** PCP: Dr. Manus Gunning HF Cardiologist: Dr. Gala Romney  HPI:  Mr.Tannehillis a 65 year old male with no prior cardiac history.   Presented to [facility] 02/15/20 with chest pain where he collapsed CPR initiated. CPR was done for 15 minutes. He lost pulse again went to PEA arrest CPR resumed. Echo with severely reduced LV systolic function no evidence of pericardial effusion. EKG with ST elevation with lateral involvement. Emergent cardiac catheterization showed totally occluded mid LCx treated with PCI & Impella placed for cardiogenic shock. Recurrent VT after cardiac cath, defibrillated x1, maintained on amiodarone. Limited echo repeated 02/17/20 and EF had improved, 50-55%. RV normal. Neurology consulted 02/17/20 for left-sided weakness. EEG negative. 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 Maintained on aspirin and Plavix for CVA prophylaxis.Venous Doppler studies negative. He was discharged to [facility] for on-going therapy. Discharge weight was 207 lbs.  He was seen back for post-hospital follow-up on 03/16/20 and was doing well, NYHA Class II. BP was stable w/o orthostatic symptoms. Midodrine was discontinued.  Had return follow-up with pharmD on 04/09/20 for further med titration. Jardiance 10 mg daily was added and metformin was discontinued.   He returned to HF clinic with his wife for follow-up on 04/30/20. Was tolerating Jardiance ok. No side effects. Had been checking BP reguarly at home. No hypotension and no orthostatic symptoms. BP at home was running ~117-120s systolic and ~80 diastolic. BP was elevated at this visit at 150/100, which he attributed to recent worsening low back pain (lumbar disc disease). He denied CP. No resting dyspnea but SOB walking up stairs. His wife also had noticed he was winded climbing stairs. His wt was up 5 lb. ReDs Clip elevated at 40%. Wt at home had been ~195 lb, but elevated this day at 200 lb. Reported full med  compliance. Admited to dietary indiscretion with sodium.  Today he returns to HF clinic for pharmacist medication titration. At last visit with APP, Entresto 24/26 mg BID was added. ***   Overall feeling ***. Dizziness, lightheadedness, fatigue:  Chest pain or palpitations:  How is your breathing?: *** SOB: Able to complete all ADLs. Activity level ***  Weight at home pounds. Takes furosemide/torsemide/bumex *** mg *** daily.  LEE PND/Orthopnea  Appetite *** Low-salt diet:   Physical Exam Cost/affordability of meds   -labs? -plan? -f/u?    HF Medications: Carvedilol 3.125 mg BID Entresto 24/26 mg BID Spironolactone 12.5 mg daily Jardiance 10 mg daily  Has the patient been experiencing any side effects to the medications prescribed?  {YES NO:22349}  Does the patient have any problems obtaining medications due to transportation or finances?   {YES NO:22349}  Understanding of regimen: {excellent/good/fair/poor:19665} Understanding of indications: {excellent/good/fair/poor:19665} Potential of compliance: {excellent/good/fair/poor:19665} Patient understands to avoid NSAIDs. Patient understands to avoid decongestants.    Pertinent Lab Values: . Serum creatinine ***, BUN ***, Potassium ***, Sodium ***, BNP ***, Magnesium ***, Digoxin ***   Vital Signs: . Weight: *** (last clinic weight: ***) . Blood pressure: ***  . Heart rate: ***   Assessment/Plan: 1. H/O Cardiac (VT arrest in setting of acute inferolateral STEMI) - OOH cardiac arrest on 02/15/20 with prolonged CPR, resuscitated w/ CPR + defibrillation  - Emergent cath 02/15/20 w/ totally occluded mid LCx treated w/ PCI + DES - Recurrent VT on 1/14 w/ defib x1 - Now off amiodarone. No syncope/ near syncope.  - Continue carvedilol 3.125 mg bid.  2. 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 RV normal - NYHA Class III. Mildly fluid overloaded on exam. ReDs Clip 40% - Continue carvedilol 3.125 mg BID - Continue Entresto 24-26 mg BID   - Continue spironolactone 12.5 mg daily - Continue Jardiance 10 mg daily  - Check BMP today and again in 7 days ***  3. CAD s/p STEMI  - Emergent cath 1/12 w/ totally occluded mid LCx treated w/ PCI + DES -There is moderate ostial LAD disease and significant disease in the distal LAD close to the apex. No significant disease affecting the right coronary artery. - Stable w/o CP  - Continue DAPT + rosuvastatin. -Continue carvedilol.  4. H/o CVA with ICH - MRI 1/22 with multiple small infarcts and small ICH (Likely cardioembolic from arrest). - L sided weakness resolved.  5.Orthostatic Hypotension - Resolved. No further symptoms - BP stable off midodrine - Per above, we will try low dose Entresto, Pt advised to monitor closely for hypotension/ Orthostatic symptoms   6. Pre-diabetes  - HgbA1c 6.0. - Metformin recently discontinued - now on Jardiance 10 mg daily for CHF - repeat Hgb A1c in 3 months   F/u BMP in 1 week. F/u w/ pharmD in 2-3 weeks    Lucina Mellow (PharmD Candidate 2022)  Karle Plumber, PharmD, BCPS, [facility], CPP Heart Failure Clinic Pharmacist [phone]

## 2020-05-31 ENCOUNTER — Ambulatory Visit (HOSPITAL_COMMUNITY)
Admission: RE | Admit: 2020-05-31 | Discharge: 2020-05-31 | Disposition: A | Discharge status: Home / Self Care | Payer: BC Managed Care – PPO | Source: Ambulatory Visit | Attending: Internal Medicine | Admitting: Internal Medicine

## 2020-05-31 ENCOUNTER — Other Ambulatory Visit: Payer: Self-pay

## 2020-05-31 VITALS — BP 144/96 | HR 66 | Wt 202.4 lb

## 2020-05-31 DIAGNOSIS — I2510 Atherosclerotic heart disease of native coronary artery without angina pectoris: Secondary | ICD-10-CM | POA: Insufficient documentation

## 2020-05-31 DIAGNOSIS — I252 Old myocardial infarction: Secondary | ICD-10-CM | POA: Insufficient documentation

## 2020-05-31 DIAGNOSIS — R7303 Prediabetes: Secondary | ICD-10-CM | POA: Insufficient documentation

## 2020-05-31 DIAGNOSIS — Z8674 Personal history of sudden cardiac arrest: Secondary | ICD-10-CM | POA: Insufficient documentation

## 2020-05-31 DIAGNOSIS — Z8673 Personal history of transient ischemic attack (TIA), and cerebral infarction without residual deficits: Secondary | ICD-10-CM | POA: Insufficient documentation

## 2020-05-31 DIAGNOSIS — I5022 Chronic systolic (congestive) heart failure: Secondary | ICD-10-CM | POA: Diagnosis present

## 2020-05-31 DIAGNOSIS — Z7984 Long term (current) use of oral hypoglycemic drugs: Secondary | ICD-10-CM | POA: Insufficient documentation

## 2020-05-31 DIAGNOSIS — Z79899 Other long term (current) drug therapy: Secondary | ICD-10-CM | POA: Insufficient documentation

## 2020-05-31 MED ORDER — SPIRONOLACTONE 25 MG PO TABS: 25.0000 mg | ORAL_TABLET | Freq: Every day | ORAL | 3 refills | Status: DC

## 2020-05-31 NOTE — Progress Notes (Signed)
PCP: Dr. Manus Gunning HF Cardiologist: Dr. Gala Romney  HPI:  Mr.Tannehillis a 65 year old male with no prior cardiac history.  Presented to [facility] 02/15/20 with chest pain where he collapsed CPR initiated. CPR was done for 15 minutes. He lost pulse again went to PEA arrest CPR resumed. Echo with severely reduced LV systolic function no evidence of pericardial effusion. EKG with ST elevation with lateral involvement. Emergent cardiac catheterization showed totally occluded mid LCx treated with PCI & Impella placed for cardiogenic shock. Recurrent VT after cardiac cath, defibrillated x1, maintained on amiodarone. Limited echo repeated 02/17/20 and EF had improved, 50-55%. RV normal. Neurology consulted 02/17/20 for left-sided weakness. EEG negative. 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 Maintained on aspirin and Plavix for CVA prophylaxis.Venous Doppler studies negative. He was discharged to [facility] for on-going therapy. Discharge weight was 207 lbs.  He was seen back for post-hospital follow-up on 03/16/20 and was doing well, NYHA Class II. BP was stable w/o orthostatic symptoms. Midodrine was discontinued.  Had return follow-up with PharmD on 04/09/20 for further medication titration. Jardiance 10 mg daily was added and metformin was discontinued.  He returned to HF clinic with his wife for follow-up on 04/30/20. Was tolerating Jardiance ok. No side effects. Had been checking BP reguarly at home. No hypotension and no orthostatic symptoms. BP at home was running ~117-120s systolic and ~80 diastolic. BP was elevated at this visit at 150/100, which he attributed to recent worsening low back pain (lumbar disc disease). He denied CP. No resting dyspnea but SOB walking up stairs. His wife also had noticed he was winded climbing stairs. His weight was up 5 lbs. ReDs Clip elevated at 40%. Weight at home had been ~195 lbs, but elevated this day at 200 lbs. Reported full medication  compliance. Admitted to dietary indiscretion with sodium.  Today he returns to HF clinic for pharmacist medication titration. At last visit with APP, Entresto 24/26 mg BID was initiated. Patient is feeling well overall and reports a good level of energy. He had one episode of dizziness/lightheadedness last night, but this was likely not due to hypotension as his BP was 117/78 when he checked afterwards. His wife reports that his home BP usually runs around 120s-130/70s. BP was 144/96 in clinic. Patient has some musculoskeletal pain where his ribs are healing, but otherwise denies any chest pain or palpitations. Patient has had limited physical activity (usually only walks to mailbox and back, ~800 ft, every day) but he usually does not have SOB. He does report feeling winded once this morning after climbing about 5 steps, but this resolved after a few minutes. Home weight is stable at around 197 lbs. He does not take any diuretics. Denies LEE/PND/orthopnea. His appetite has been good. He usually does not add salt to his food. He reports compliance with all medications.  HF Medications: Carvedilol 3.125 mg BID Entresto 24/26 mg BID  Spironolactone 12.5 mg daily Jardiance 10 mg daily  Has the patient been experiencing any side effects to the medications prescribed?  No   Does the patient have any problems obtaining medications due to transportation or finances?   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 However, patient's wife will be retiring soon which may affect their insurance plan. Advised patient to call the clinic for assistance if he has issues affording his medications later on.  Understanding of regimen: good Understanding of indications: good Potential  of compliance: good Patient understands to avoid NSAIDs. Patient understands to avoid decongestants.    Pertinent Lab Values: . 05/08/20: Serum creatinine 0.86,  Potassium 4.4, BUN 15, Na 137  Vital Signs: . Weight: 202 lbs (last clinic weight: 200 lbs) . Blood pressure: 144/96  . Heart rate: 66   Assessment/Plan: 1. H/O Cardiac (VT arrest in setting of acute inferolateral STEMI) - OOH cardiac arrest on 02/15/20 with prolonged CPR, resuscitated w/ CPR + defibrillation  - Emergent cath 02/15/20 w/ totally occluded mid LCx treated w/ PCI + DES - Recurrent VT on 02/17/20 w/ defib x1 - Now off amiodarone. No syncope/ near syncope.  - Continue carvedilol 3.125 mg BID.  2. 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 RV normal - NYHA Class II. Euvolemic on exam - Continue carvedilol 3.125 mg BID - Continue Entresto 24/26 mg BID. Consider increasing at next visit if remains stable. - Increase spironolactone to 25 mg daily. Recheck BMET in 1 week. - Continue Jardiance 10 mg daily  3. CAD s/p STEMI  - Emergent cath 02/15/20 w/ totally occluded mid LCx treated w/ PCI + DES -There is moderate ostial LAD disease and significant disease in the distal LAD close to the apex. No significant disease affecting the right coronary artery. - Stable w/o CP  - Continue DAPT + rosuvastatin -Continue carvedilol  4. H/o CVA with ICH - MRI 02/2020 with multiple small infarcts and small ICH (Likely cardioembolic from arrest). - L sided weakness resolved.  5.Orthostatic Hypotension - Resolved. No further symptoms - BP stable off midodrine - Continue current regimen.   6. Pre-diabetes  - HgbA1c 6.0. - Metformin recently discontinued - now on Jardiance 10 mg daily for CHF  Recheck BMET in 1 week (06/07/20). Patient will follow up in clinic with APP in 6 weeks (07/09/20).   Lucina Mellow (PharmD Candidate 2022)  Karle Plumber, PharmD, BCPS, BCCP, CPP Heart Failure Clinic Pharmacist [phone]

## 2020-05-31 NOTE — Patient Instructions (Addendum)
It was a pleasure seeing you today!  MEDICATIONS: -We are changing your medications today -Increase spironolactone to 25 mg (1 tablet) daily. -Call if you have questions about your medications.   NEXT APPOINTMENT: Return to clinic in 6 weeks with APP Clinic.  In general, to take care of your heart failure: -Limit your fluid intake to 2 Liters (half-gallon) per day.   -Limit your salt intake to ideally 2-3 grams (2000-3000 mg) per day. -Weigh yourself daily and record, and bring that "weight diary" to your next appointment.  (Weight gain of 2-3 pounds in 1 day typically means fluid weight.) -The medications for your heart are to help your heart and help you live longer.   -Please contact us before stopping any of your heart medications.  Call the clinic at [phone] with questions or to reschedule future appointments.

## 2020-06-01 ENCOUNTER — Telehealth (HOSPITAL_COMMUNITY): Payer: Self-pay | Admitting: Pharmacist

## 2020-06-01 ENCOUNTER — Other Ambulatory Visit (HOSPITAL_COMMUNITY): Payer: Self-pay | Admitting: *Deleted

## 2020-06-01 ENCOUNTER — Other Ambulatory Visit (HOSPITAL_COMMUNITY): Payer: Self-pay

## 2020-06-01 MED ORDER — ENTRESTO 24-26 MG PO TABS: 1.0000 | ORAL_TABLET | Freq: Two times a day (BID) | ORAL | 3 refills | Status: DC

## 2020-06-01 NOTE — Telephone Encounter (Signed)
Entresto prescription sent to [facility].  Karle Plumber, PharmD, BCPS, BCCP, CPP Heart Failure Clinic Pharmacist [phone]

## 2020-06-06 ENCOUNTER — Encounter (HOSPITAL_COMMUNITY): Payer: Self-pay

## 2020-06-07 ENCOUNTER — Telehealth (HOSPITAL_COMMUNITY): Payer: Self-pay | Admitting: *Deleted

## 2020-06-07 ENCOUNTER — Other Ambulatory Visit: Payer: Self-pay

## 2020-06-07 ENCOUNTER — Ambulatory Visit (HOSPITAL_COMMUNITY)
Admission: RE | Admit: 2020-06-07 | Discharge: 2020-06-07 | Disposition: A | Discharge status: Home / Self Care | Payer: BC Managed Care – PPO | Source: Ambulatory Visit | Attending: Internal Medicine | Admitting: Internal Medicine

## 2020-06-07 ENCOUNTER — Telehealth (HOSPITAL_COMMUNITY): Payer: Self-pay

## 2020-06-07 DIAGNOSIS — I5022 Chronic systolic (congestive) heart failure: Secondary | ICD-10-CM | POA: Insufficient documentation

## 2020-06-07 DIAGNOSIS — E785 Hyperlipidemia, unspecified: Secondary | ICD-10-CM

## 2020-06-07 LAB — BASIC METABOLIC PANEL
Anion gap: 10 (ref 5–15)
BUN: 12 mg/dL (ref 8–23)
CO2: 22 mmol/L (ref 22–32)
Calcium: 9.4 mg/dL (ref 8.9–10.3)
Chloride: 105 mmol/L (ref 98–111)
Creatinine, Ser: 0.83 mg/dL (ref 0.61–1.24)
GFR, Estimated: >60 mL/min (ref >60)
Glucose, Bld: 164 mg/dL — ABNORMAL HIGH (ref 70–99)
Potassium: 3.8 mmol/L (ref 3.5–5.1)
Sodium: 137 mmol/L (ref 135–145)

## 2020-06-07 NOTE — Telephone Encounter (Signed)
Called pt back and pt stated that his sister would like to ask a couple of questions in regards to the cardiac rehab program, called his sister on 3-way and he gave permission for me to talk to his sister Waynetta Sandy. Advised her of what the cardiac rehab program consist of. I advised both of them that he had up to a year to schedule without needing another referral for another event. They understood and would like to call back once they speak with pt wife. Placed referral on desk at the bottom.

## 2020-06-07 NOTE — Telephone Encounter (Signed)
Received a call from [facility] at [facility] requesting pt have an appt with pharmacist to discuss medications for high cholesterol. She is aware that our clinic pharmacist is out on leave. Pt referred to the lipid clinic.

## 2020-06-14 ENCOUNTER — Other Ambulatory Visit (HOSPITAL_COMMUNITY): Payer: Self-pay | Admitting: Adult Health

## 2020-06-15 ENCOUNTER — Encounter (HOSPITAL_COMMUNITY): Payer: Self-pay

## 2020-06-15 NOTE — Telephone Encounter (Signed)
Pt called back and stated that he would call back when he is ready to schedule that  He was taking a couple of trips and will call back when he is ready. Closed referral until pt is ready.

## 2020-07-05 ENCOUNTER — Ambulatory Visit: Payer: BC Managed Care – PPO

## 2020-07-05 NOTE — Progress Notes (Deleted)
Patient ID: Jeffrey Morse                 DOB: 04/09/1955                    MRN: [id number]     HPI: Jeffrey Morse is a 64 y.o. male patient referred to lipid clinic by Dr. Bensimhon. PMH is significant for cardiac arrest, chronic systolic heart failure, CVA, HTN, CAD s/p STEMI, prediabetes and HLD. Patients PCP was concerned about his latest lipid labs. He sent them to Dr. Bensimhom's office who in turn asked us to see the patient.   State health plan [id number] Compliance  Current Medications: rosuvastatin 40mg daily Intolerances:  Risk Factors: STEMI, CVA LDL goal: <55  Diet:   Exercise:   Family History:  Family History  Problem Relation Age of Onset  . Colon cancer Mother   . Stroke Father     Social History: former smoker  Labs: 06/04/20 TC 180, HDL 52 TG 131 LDL 105  Past Medical History:  Diagnosis Date  . HTN (hypertension)   . Hypercholesteremia   . Hypertension    Borderline hypertension  . Sleep apnea   . Stroke (HCC)     Current Outpatient Medications on File Prior to Visit  Medication Sig Dispense Refill  . acetaminophen (TYLENOL) 325 MG tablet Take 2 tablets (650 mg total) by mouth every 4 (four) hours as needed for mild pain (or temp > 37.5 C (99.5 F)).    . Aspirin 81 MG CAPS 1 capsule    . blood glucose meter kit and supplies Dispense based on patient and insurance preference. Use up to four times daily as directed. (FOR ICD-10 E10.9, E11.9). 1 each 0  . carvedilol (COREG) 3.125 MG tablet Take 1 tablet (3.125 mg total) by mouth 2 (two) times daily with a meal. 60 tablet 11  . clopidogrel (PLAVIX) 75 MG tablet Take 1 tablet (75 mg total) by mouth daily. 30 tablet 11  . empagliflozin (JARDIANCE) 10 MG TABS tablet Take 1 tablet (10 mg total) by mouth daily. 30 tablet 11  . escitalopram (LEXAPRO) 20 MG tablet Take 20 mg by mouth daily.    . Multiple Vitamin (MULTI VITAMIN) TABS     . omeprazole (PRILOSEC) 20 MG capsule Take 1 capsule (20 mg total) by  mouth daily. 30 capsule 0  . rosuvastatin (CRESTOR) 40 MG tablet Take 1 tablet (40 mg total) by mouth daily. 30 tablet 11  . sacubitril-valsartan (ENTRESTO) 24-26 MG Take 1 tablet by mouth 2 (two) times daily. 180 tablet 3  . spironolactone (ALDACTONE) 25 MG tablet Take 1 tablet (25 mg total) by mouth daily. 90 tablet 3   No current facility-administered medications on file prior to visit.    Allergies  Allergen Reactions  . Atorvastatin     Other reaction(s): back pain (10/2017)  . Rosuvastatin Calcium     Other reaction(s): back pain (01/2018)    Assessment/Plan:  1. Hyperlipidemia -    Thank you,    D , Pharm.D, BCPS, CPP [facility]  [address], [place], [place] [address]  Phone: [phone]; Fax: [phone]    

## 2020-07-09 ENCOUNTER — Encounter (HOSPITAL_COMMUNITY): Payer: Self-pay

## 2020-07-09 ENCOUNTER — Other Ambulatory Visit: Payer: Self-pay

## 2020-07-09 ENCOUNTER — Ambulatory Visit (HOSPITAL_COMMUNITY)
Admission: RE | Admit: 2020-07-09 | Discharge: 2020-07-09 | Disposition: A | Discharge status: Home / Self Care | Payer: BC Managed Care – PPO | Source: Ambulatory Visit | Attending: Cardiology | Admitting: Cardiology

## 2020-07-09 VITALS — BP 125/85 | HR 82 | Ht 71.0 in | Wt 200.0 lb

## 2020-07-09 DIAGNOSIS — I5022 Chronic systolic (congestive) heart failure: Secondary | ICD-10-CM

## 2020-07-09 DIAGNOSIS — I2510 Atherosclerotic heart disease of native coronary artery without angina pectoris: Secondary | ICD-10-CM

## 2020-07-09 MED ORDER — SACUBITRIL-VALSARTAN 49-51 MG PO TABS: 1.0000 | ORAL_TABLET | Freq: Two times a day (BID) | ORAL | 5 refills | Status: DC

## 2020-07-09 NOTE — Progress Notes (Addendum)
Pt had virtual visit with physician assistant this morning. Reviewed visit summary with patient. All questions answered. Appts scheduled for labs and f/u.  Summary sent to patient via chart

## 2020-07-09 NOTE — Patient Instructions (Signed)
INCREASE Entresto to 49/51mg  (1 tab) twice a day  Labs in 1 week on Monday June 13th, 2022 at 8:30am We will only contact you if something comes back abnormal or we need to make some changes. Otherwise no news is good news!  Your physician recommends that you schedule a follow-up appointment in: 8 weeks with Dr Gala Romney on Thursday August 11th, 2022 at 2:40pm  Please call office at [phone] option 2 if you have any questions or concerns.    At the [facility], you and your health needs are our priority. As part of our continuing mission to provide you with exceptional heart care, we have created designated Provider Care Teams. These Care Teams include your primary Cardiologist (physician) and Advanced Practice Providers (APPs- Physician Assistants and Nurse Practitioners) who all work together to provide you with the care you need, when you need it.   You may see any of the following providers on your designated Care Team at your next follow up: Marland Kitchen Dr Arvilla Meres . Dr Marca Ancona . Dr Thornell Mule . Tonye Becket, NP . Robbie Lis, PA . Shanda Bumps [place],NP . Karle Plumber, PharmD   Please be sure to bring in all your medications bottles to every appointment.

## 2020-07-09 NOTE — Progress Notes (Signed)
Heart Failure TeleHealth Note  Due to national recommendations of social distancing due to COVID 19, Audio/video telehealth visit is felt to be most appropriate for this patient at this time.  See MyChart message from today for patient consent regarding telehealth for [facility].  Date:  07/09/2020   ID:  Jeffrey Morse, DOB 10-Sep-1955, MRN [id number]  Location: Home  Provider location: [place] [facility] Type of Visit: Established patient   PCP:  Blair Heys, MD  Cardiologist:  None Primary HF: Dr. Gala Romney   Chief Complaint:  Chief Complaint  Patient presents with  . Follow-up      History of Present Illness: Jeffrey Morse is a 65 y.o. male with a history systolic heart failure.   He presents via Web designer for a telehealth visit today.     He denies symptoms worrisome for COVID 19.   We have been following Mr. Taney since his hospitalization 02/2020 for cardiac arrest. He first presented to [facility] 02/15/2020 with chest pain where he collapsed CPR initiated. CPR was done for 15 minutes. He lost pulse again went to PEA arrest CPR resumed. Echo with severely reduced LV systolic function no evidence of pericardial effusion. EKG with ST elevation with lateral involvement. Emergent cardiac catheterization showed totally occluded mid LCx treated with PCI & Impella placed for cardiogenic shock. Recurrent VT after cardiac cath, defibrillated x1, maintained on amiodarone. Limited echo repeated 1/14 and EF had improved, 50-55%. RV normal. Neurology consulted 02/17/2020 for left-sided weakness. EEG negative. 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 Maintained on aspirin and Plavix for CVA prophylaxis.Venous Doppler studies negative. He was discharged to [facility] for on-going therapy. Discharge weight was 207 lbs.   He was seen back for post hospital f/u on 03/16/20 and was doing well, NYHA Class II. BP was  stable w/o orthostatic symptoms. Midodrine was discontinued. Had return f/u w/ pharmD on 3/7 for further med titration and Jardiance 10 mg daily was added. Metformin discontinued.   We have continued to gradually titrate his meds. Sherryll Burger has been added.  His last visit was w/ the PharmD. Cleda Daub was increased to 25 mg daily.   Today in f/u, he reports doing well. He denies chest pain. Some SOB w/ moderate level activities, NYHA Class II. Denies wt gain. No orthopnea/PND or LEE. Reports full med compliance, tolerating well w/o side effects. No orthostatic symptoms. BP today at home was 125/85. Pulse rate 82 bpm.    Cardiac Studies: [facility] 02/15/20:  PCI/DES to Left Circ, Impella - Mildly elevated filling pressures, mild pulmonary hypertension and normal cardiac output.  RVSP: 39 mm Hg RVDP: 12 mmHG PASP: 38 mmHg PADP: 24 mmHg PA mean: 29 mmHg LVSP: 125 mmHg LVDP: 18 mmHg Fick CO/CI: 6.18/2.83  Echo (02/16/20): EF ~50-55% LV small RV ok.   ROS: All systems negative except as listed in HPI, PMH and Problem List.   Past Medical History:  Diagnosis Date  . HTN (hypertension)   . Hypercholesteremia   . Hypertension    Borderline hypertension  . Sleep apnea   . Stroke [facility])    Past Surgical History:  Procedure Laterality Date  . BACK SURGERY    . CARDIAC CATHETERIZATION    . CORONARY STENT INTERVENTION N/A 02/15/2020   Procedure: CORONARY STENT INTERVENTION;  Surgeon: Iran Ouch, MD;  Location: [facility] INVASIVE CV LAB;  Service: Cardiovascular;  Laterality: N/A;  CFX  . KNEE SURGERY Left  2012  . LEFT HEART CATH AND CORONARY ANGIOGRAPHY N/A 02/15/2020   Procedure: LEFT HEART CATH AND CORONARY ANGIOGRAPHY;  Surgeon: Iran Ouch, MD;  Location: [facility] INVASIVE CV LAB;  Service: Cardiovascular;  Laterality: N/A;  . RIGHT HEART CATH N/A 02/15/2020   Procedure: RIGHT HEART CATH;  Surgeon: Iran Ouch, MD;  Location: [facility] INVASIVE CV LAB;  Service: Cardiovascular;  Laterality:  N/A;  . VENTRICULAR ASSIST DEVICE INSERTION N/A 02/15/2020   Procedure: VENTRICULAR ASSIST DEVICE INSERTION;  Surgeon: Iran Ouch, MD;  Location: [facility] INVASIVE CV LAB;  Service: Cardiovascular;  Laterality: N/A;  iMPELLA      Current Outpatient Medications  Medication Sig Dispense Refill  . acetaminophen (TYLENOL) 325 MG tablet Take 2 tablets (650 mg total) by mouth every 4 (four) hours as needed for mild pain (or temp > 37.5 C (99.5 F)).    . Aspirin 81 MG CAPS 1 capsule    . carvedilol (COREG) 3.125 MG tablet Take 1 tablet (3.125 mg total) by mouth 2 (two) times daily with a meal. 60 tablet 11  . clopidogrel (PLAVIX) 75 MG tablet Take 1 tablet (75 mg total) by mouth daily. 30 tablet 11  . empagliflozin (JARDIANCE) 10 MG TABS tablet Take 1 tablet (10 mg total) by mouth daily. 30 tablet 11  . escitalopram (LEXAPRO) 20 MG tablet Take 20 mg by mouth daily.    . Multiple Vitamin (MULTI VITAMIN) TABS     . omeprazole (PRILOSEC) 20 MG capsule Take 1 capsule (20 mg total) by mouth daily. 30 capsule 0  . rosuvastatin (CRESTOR) 40 MG tablet Take 1 tablet (40 mg total) by mouth daily. 30 tablet 11  . sacubitril-valsartan (ENTRESTO) 24-26 MG Take 1 tablet by mouth 2 (two) times daily. 180 tablet 3  . spironolactone (ALDACTONE) 25 MG tablet Take 1 tablet (25 mg total) by mouth daily. 90 tablet 3   No current facility-administered medications for this encounter.    Allergies:   Atorvastatin and Rosuvastatin calcium   Social History:  The patient  reports that he has quit smoking. He has never used smokeless tobacco. He reports previous alcohol use. He reports that he does not use drugs.   Family History:  The patient's family history includes Colon cancer in his mother; Stroke in his father.   ROS:  Please see the history of present illness.   All other systems are personally reviewed and negative.   Today's Vitals   07/09/20 1113  BP: 125/85  Pulse: 82  Weight: 90.7 kg (200 lb)  Height:  5\' 11"  (1.803 m)   Body mass index is 27.89 kg/m.  Exam:  (Video/Tele Health Call; Exam is subjective and or/visual.) General:  Speaks in full sentences. No resp difficulty. Lungs: Normal respiratory effort with conversation.  Abdomen: Non-distended per patient report Extremities: Pt denies edema. Neuro: Alert & oriented x 3.   Recent Labs: 02/24/2020: Magnesium 1.9 02/29/2020: ALT 56; Hemoglobin 11.0; Platelets 808 06/07/2020: BUN 12; Creatinine, Ser 0.83; Potassium 3.8; Sodium 137  Personally reviewed   Wt Readings from Last 3 Encounters:  07/09/20 90.7 kg (200 lb)  05/31/20 91.8 kg (202 lb 6.4 oz)  04/30/20 90.8 kg (200 lb 3.2 oz)      ASSESSMENT AND PLAN:  1. H/O Cardiac (VT arrest in setting of acute inferolateral STEMI) - OOH cardiac arrest on 02/15/20 with prolonged CPR, resuscitated w/ CPR + defibrillation  - Emergent cath 02/15/20 w/ totally occluded mid LCx treated w/ PCI + DES -  Recurrent VT on 1/14 w/ defib x1 - Now off amiodarone w/ recovered EF. No recurrent syncope/ near syncope.  - Continue carvedilol 3.125 mg bid.  2. Chronic Systolic HF>>Diastolic Heart Failure  - EF (0/09/38): 25% in cath lab post cardiac arrest in setting of acute MI  - Repeat limited Echo post PCI (02/16/20): EF 50-55%. RV normal - NYHA Class II. Wt reportedly stable at home. Denies LEE - Increase Entresto to 49-51 mg bid   - Continue spironolactone 25 mg daily. - Continue Jardiance 10 mg daily  - Continue Coreg 3.125 mg bid  - Check BMP  - discussed daily wts + low sodium diet   3. CAD s/p STEMI  - Emergent cath 1/12 w/ totally occluded mid LCx treated w/ PCI + DES -There is moderate ostial LAD disease and significant disease in the distal LAD close to the apex. No significant disease affecting the right coronary artery. - denies s/s of ischemia  - Continue DAPT + rosuvastatin. Check FLP. Goal LDL < 70  -Continue carvedilol  4. H/o CVA with ICH - MRI 1/22 with multiple small  infarcts and small ICH (Likely cardioembolic from arrest). - L sided weakness resolved.  5.H/o Orthostatic Hypotension - Resolved. No further symptoms - BP stable off midodrine - tolerating GDMT ok   6. Pre-diabetes  - HgbA1c 6.0 1/22 - Metformin recently discontinued - now on Jardiance 10 mg daily for CHF - PCP to follow   F/u w/ Dr. Gala Romney next visit in 6 weeks.    COVID screen The patient does not have any symptoms that suggest any further testing/ screening at this time.  Social distancing reinforced today.  Patient Risk: After full review of this patients clinical status, I feel that they are at moderate risk for cardiac decompensation at this time.  Relevant cardiac medications were reviewed at length with the patient today. The patient does not have concerns regarding their medications at this time.   The following changes were made today:  Increasing Entresto to 49-51 mg bid  Recommended follow-up:  6 weeks w/ MD   Today, I have spent 10 minutes with the patient with telehealth technology discussing the above issues.   Signed, Robbie Lis, PA-C  07/09/2020 11:29 AM  [facility] [address] Heart and Vascular [place] [address] [phone] (office) [phone] (fax)

## 2020-07-16 ENCOUNTER — Other Ambulatory Visit: Payer: Self-pay

## 2020-07-16 ENCOUNTER — Ambulatory Visit (HOSPITAL_COMMUNITY)
Admission: RE | Admit: 2020-07-16 | Discharge: 2020-07-16 | Disposition: A | Discharge status: Home / Self Care | Payer: BC Managed Care – PPO | Source: Ambulatory Visit | Attending: Internal Medicine | Admitting: Internal Medicine

## 2020-07-16 DIAGNOSIS — I5022 Chronic systolic (congestive) heart failure: Secondary | ICD-10-CM | POA: Diagnosis present

## 2020-07-16 LAB — BASIC METABOLIC PANEL
Anion gap: 9 (ref 5–15)
BUN: 16 mg/dL (ref 8–23)
CO2: 23 mmol/L (ref 22–32)
Calcium: 9.2 mg/dL (ref 8.9–10.3)
Chloride: 104 mmol/L (ref 98–111)
Creatinine, Ser: 0.83 mg/dL (ref 0.61–1.24)
GFR, Estimated: >60 mL/min (ref >60)
Glucose, Bld: 114 mg/dL — ABNORMAL HIGH (ref 70–99)
Potassium: 4.2 mmol/L (ref 3.5–5.1)
Sodium: 136 mmol/L (ref 135–145)

## 2020-07-16 LAB — LIPID PANEL
Cholesterol: 178 mg/dL (ref 0–200)
HDL: 45 mg/dL (ref >40)
LDL Cholesterol: 104 mg/dL — ABNORMAL HIGH (ref 0–99)
Total CHOL/HDL Ratio: 4 RATIO
Triglycerides: 143 mg/dL (ref <150)
VLDL: 29 mg/dL (ref 0–40)

## 2020-07-16 LAB — HEPATIC FUNCTION PANEL
ALT: 13 U/L (ref 0–44)
AST: 17 U/L (ref 15–41)
Albumin: 3.8 g/dL (ref 3.5–5.0)
Alkaline Phosphatase: 92 U/L (ref 38–126)
Bilirubin, Direct: 0.1 mg/dL (ref 0.0–0.2)
Indirect Bilirubin: 0.5 mg/dL (ref 0.3–0.9)
Total Bilirubin: 0.6 mg/dL (ref 0.3–1.2)
Total Protein: 7.1 g/dL (ref 6.5–8.1)

## 2020-07-18 ENCOUNTER — Telehealth (HOSPITAL_COMMUNITY): Payer: Self-pay | Admitting: Cardiology

## 2020-07-18 MED ORDER — EZETIMIBE 10 MG PO TABS: 10.0000 mg | ORAL_TABLET | Freq: Every day | ORAL | 3 refills | Status: DC

## 2020-07-18 NOTE — Telephone Encounter (Signed)
-----   Message from Allayne Butcher, [place] sent at 07/17/2020 10:31 AM EDT ----- LDL (bad cholesterol) is still not at goal. Continue Crestor 40 daily and add Zetia 10 mg daily. Repeat FLP and HFTs in 8 weeks.

## 2020-07-18 NOTE — Telephone Encounter (Signed)
Patient called.  Patient [phone] (M)

## 2020-07-24 ENCOUNTER — Encounter: Payer: Self-pay | Admitting: *Deleted

## 2020-08-03 ENCOUNTER — Telehealth (HOSPITAL_COMMUNITY): Payer: Self-pay | Admitting: *Deleted

## 2020-08-03 ENCOUNTER — Encounter (HOSPITAL_COMMUNITY): Payer: Self-pay | Admitting: *Deleted

## 2020-08-03 NOTE — Telephone Encounter (Signed)
Pt left vm stating he tested positive for covid. I sent message via mychart asking pt to contact pcp and sent an otc list of meds for symptom management.

## 2020-09-05 ENCOUNTER — Telehealth (HOSPITAL_COMMUNITY): Payer: Self-pay | Admitting: Family Medicine

## 2020-09-05 ENCOUNTER — Telehealth (HOSPITAL_COMMUNITY): Payer: Self-pay

## 2020-09-05 NOTE — Telephone Encounter (Signed)
Pt called back wanting to schedule for cardiac rehab, I advised pt that I would open his referral back up and will give him a call once he is ready.

## 2020-09-07 ENCOUNTER — Telehealth (HOSPITAL_COMMUNITY): Payer: Self-pay | Admitting: *Deleted

## 2020-09-07 NOTE — Telephone Encounter (Signed)
Handicap placard application mailed to pt last Thursday.

## 2020-09-12 NOTE — Telephone Encounter (Signed)
Called patient to see if he was interested in participating in the Cardiac Rehab Program. Patient stated yes. Patient will come in for orientation on 10/11/2020@10 :00am and will attend the 9:00am exercise class.   Pensions consultant.

## 2020-09-13 ENCOUNTER — Other Ambulatory Visit: Payer: Self-pay

## 2020-09-13 ENCOUNTER — Ambulatory Visit (HOSPITAL_COMMUNITY)
Admission: RE | Admit: 2020-09-13 | Discharge: 2020-09-13 | Disposition: A | Discharge status: Home / Self Care | Payer: BC Managed Care – PPO | Source: Ambulatory Visit | Attending: Internal Medicine | Admitting: Internal Medicine

## 2020-09-13 ENCOUNTER — Encounter (HOSPITAL_COMMUNITY): Payer: Self-pay | Admitting: Internal Medicine

## 2020-09-13 VITALS — BP 110/78 | HR 66 | Wt 202.0 lb

## 2020-09-13 DIAGNOSIS — Z7984 Long term (current) use of oral hypoglycemic drugs: Secondary | ICD-10-CM | POA: Diagnosis not present

## 2020-09-13 DIAGNOSIS — I2510 Atherosclerotic heart disease of native coronary artery without angina pectoris: Secondary | ICD-10-CM | POA: Insufficient documentation

## 2020-09-13 DIAGNOSIS — Z87891 Personal history of nicotine dependence: Secondary | ICD-10-CM | POA: Diagnosis not present

## 2020-09-13 DIAGNOSIS — Z955 Presence of coronary angioplasty implant and graft: Secondary | ICD-10-CM | POA: Diagnosis not present

## 2020-09-13 DIAGNOSIS — I5022 Chronic systolic (congestive) heart failure: Secondary | ICD-10-CM | POA: Insufficient documentation

## 2020-09-13 DIAGNOSIS — Z8673 Personal history of transient ischemic attack (TIA), and cerebral infarction without residual deficits: Secondary | ICD-10-CM | POA: Diagnosis not present

## 2020-09-13 DIAGNOSIS — E785 Hyperlipidemia, unspecified: Secondary | ICD-10-CM | POA: Diagnosis not present

## 2020-09-13 DIAGNOSIS — R7303 Prediabetes: Secondary | ICD-10-CM | POA: Insufficient documentation

## 2020-09-13 DIAGNOSIS — I110 Hypertensive heart disease with heart failure: Secondary | ICD-10-CM | POA: Diagnosis not present

## 2020-09-13 DIAGNOSIS — I219 Acute myocardial infarction, unspecified: Secondary | ICD-10-CM | POA: Diagnosis not present

## 2020-09-13 DIAGNOSIS — Z79899 Other long term (current) drug therapy: Secondary | ICD-10-CM | POA: Diagnosis not present

## 2020-09-13 DIAGNOSIS — Z9861 Coronary angioplasty status: Secondary | ICD-10-CM

## 2020-09-13 DIAGNOSIS — R531 Weakness: Secondary | ICD-10-CM | POA: Diagnosis not present

## 2020-09-13 DIAGNOSIS — I469 Cardiac arrest, cause unspecified: Secondary | ICD-10-CM | POA: Diagnosis not present

## 2020-09-13 DIAGNOSIS — Z7982 Long term (current) use of aspirin: Secondary | ICD-10-CM | POA: Diagnosis not present

## 2020-09-13 DIAGNOSIS — I252 Old myocardial infarction: Secondary | ICD-10-CM | POA: Insufficient documentation

## 2020-09-13 DIAGNOSIS — I951 Orthostatic hypotension: Secondary | ICD-10-CM | POA: Diagnosis not present

## 2020-09-13 LAB — COMPREHENSIVE METABOLIC PANEL
ALT: 15 U/L (ref 0–44)
AST: 19 U/L (ref 15–41)
Albumin: 3.8 g/dL (ref 3.5–5.0)
Alkaline Phosphatase: 67 U/L (ref 38–126)
Anion gap: 8 (ref 5–15)
BUN: 12 mg/dL (ref 8–23)
CO2: 22 mmol/L (ref 22–32)
Calcium: 9.2 mg/dL (ref 8.9–10.3)
Chloride: 107 mmol/L (ref 98–111)
Creatinine, Ser: 0.63 mg/dL (ref 0.61–1.24)
GFR, Estimated: >60 mL/min (ref >60)
Glucose, Bld: 89 mg/dL (ref 70–99)
Potassium: 4.1 mmol/L (ref 3.5–5.1)
Sodium: 137 mmol/L (ref 135–145)
Total Bilirubin: 0.5 mg/dL (ref 0.3–1.2)
Total Protein: 6.9 g/dL (ref 6.5–8.1)

## 2020-09-13 LAB — CBC
HCT: 43.9 % (ref 39.0–52.0)
Hemoglobin: 14.2 g/dL (ref 13.0–17.0)
MCH: 28.1 pg (ref 26.0–34.0)
MCHC: 32.3 g/dL (ref 30.0–36.0)
MCV: 86.9 fL (ref 80.0–100.0)
Platelets: 232 10*3/uL (ref 150–400)
RBC: 5.05 MIL/uL (ref 4.22–5.81)
RDW: 16.2 % — ABNORMAL HIGH (ref 11.5–15.5)
WBC: 7 10*3/uL (ref 4.0–10.5)
nRBC: 0 % (ref 0.0–0.2)

## 2020-09-13 LAB — LIPID PANEL
Cholesterol: 170 mg/dL (ref 0–200)
HDL: 56 mg/dL (ref >40)
LDL Cholesterol: 95 mg/dL (ref 0–99)
Total CHOL/HDL Ratio: 3 RATIO
Triglycerides: 93 mg/dL (ref <150)
VLDL: 19 mg/dL (ref 0–40)

## 2020-09-13 NOTE — Progress Notes (Signed)
Advanced Heart Failure Clinic Note   PCP: Dr. Manus Morse HF Cardiologist: Dr. Gala Morse  Reason for Visit: F/u for chronic systolic heart failure   HPI: [name] is a 65 year old male with no prior cardiac history.     Presented to [facility] 02/15/2020 with chest pain where he collapsed CPR initiated. CPR was done for 15 minutes. He lost pulse again went to PEA arrest CPR resumed.  Echo with severely reduced LV systolic function no evidence of pericardial effusion.  EKG with ST elevation with lateral involvement. Emergent cardiac catheterization showed totally occluded mid LCx treated with PCI & Impella placed for cardiogenic shock. Recurrent VT after cardiac cath, defibrillated x1, maintained on amiodarone. Limited echo repeated 1/14 and EF had improved, 50-55%. RV normal.  Neurology consulted 02/17/2020 for left-sided weakness. EEG negative. 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 Maintained on aspirin and Plavix for CVA prophylaxis. Venous Doppler studies negative. He was discharged to [facility] for on-going therapy. Discharge weight was 207 lbs.  He returns today for f/u. Here w/ wife. At last visit Entresto started. Active around the house pressure washing and working in the yard. Recently started walking at the park. Denies CP or SOB. No edema, orthopnea or PND. Complaint with all medicines.   Cardiac Studies: [facility] 02/15/20:  PCI/DES to Left Circ, Impella - Mildly elevated filling pressures, mild pulmonary hypertension and normal cardiac output.   RVSP: 39 mm Hg RVDP: 12 mmHG PASP: 38 mmHg PADP: 24 mmHg PA mean: 29 mmHg LVSP: 125 mmHg LVDP: 18 mmHg Fick CO/CI: 6.18/2.83  Echo (02/16/20): EF ~50-55% LV small RV ok.  ROS: All systems negative except as listed in HPI, PMH and Problem List.  SH:  Social History   Socioeconomic History   Marital status: Married    Spouse name: Not on file   Number of children: Not on file   Years of education:  Not on file   Highest education level: Not on file  Occupational History   Not on file  Tobacco Use   Smoking status: Former   Smokeless tobacco: Never   Tobacco comments:    social smoker in high School  Vaping Use   Vaping Use: Never used  Substance and Sexual Activity   Alcohol use: Not Currently   Drug use: Never   Sexual activity: Yes  Other Topics Concern   Not on file  Social History Narrative   ** Merged History Encounter **       Social Determinants of Health   Financial Resource Strain: Not on file  Food Insecurity: Not on file  Transportation Needs: Not on file  Physical Activity: Not on file  Stress: Not on file  Social Connections: Not on file  Intimate Partner Violence: Not on file   FH:  Family History  Problem Relation Age of Onset   Colon cancer Mother    Stroke Father    Past Medical History:  Diagnosis Date   HTN (hypertension)    Hypercholesteremia    Hypertension    Borderline hypertension   Sleep apnea    Stroke [facility])     Current Outpatient Medications  Medication Sig Dispense Refill   acetaminophen (TYLENOL) 325 MG tablet Take 2 tablets (650 mg total) by mouth every 4 (four) hours as needed for mild pain (or temp > 37.5 C (99.5 F)).     Aspirin 81 MG CAPS 1 capsule     carvedilol (COREG) 3.125 MG tablet Take  1 tablet (3.125 mg total) by mouth 2 (two) times daily with a meal. 60 tablet 11   clopidogrel (PLAVIX) 75 MG tablet Take 1 tablet (75 mg total) by mouth daily. 30 tablet 11   empagliflozin (JARDIANCE) 10 MG TABS tablet Take 1 tablet (10 mg total) by mouth daily. 30 tablet 11   escitalopram (LEXAPRO) 20 MG tablet Take 20 mg by mouth daily.     ezetimibe (ZETIA) 10 MG tablet Take 1 tablet (10 mg total) by mouth daily. 90 tablet 3   Multiple Vitamin (MULTI VITAMIN) TABS      omeprazole (PRILOSEC) 20 MG capsule Take 1 capsule (20 mg total) by mouth daily. 30 capsule 0   rosuvastatin (CRESTOR) 40 MG tablet Take 1 tablet (40 mg total) by  mouth daily. 30 tablet 11   sacubitril-valsartan (ENTRESTO) 49-51 MG Take 1 tablet by mouth 2 (two) times daily. 60 tablet 5   spironolactone (ALDACTONE) 25 MG tablet Take 1 tablet (25 mg total) by mouth daily. 90 tablet 3   No current facility-administered medications for this encounter.    Vitals:   09/13/20 1144  BP: 110/78  Pulse: 66  SpO2: 96%  Weight: 91.6 kg (202 lb)   Wt Readings from Last 3 Encounters:  09/13/20 91.6 kg (202 lb)  07/09/20 90.7 kg (200 lb)  05/31/20 91.8 kg (202 lb 6.4 oz)   PHYSICAL EXAM: General:  Well appearing. No resp difficulty HEENT: normal Neck: supple. no JVD. Carotids 2+ bilat; no bruits. No lymphadenopathy or thryomegaly appreciated. Cor: PMI nondisplaced. Regular rate & rhythm. No rubs, gallops or murmurs. Lungs: clear Abdomen: soft, nontender, nondistended. No hepatosplenomegaly. No bruits or masses. Good bowel sounds. Extremities: no cyanosis, clubbing, rash, edema Neuro: alert & orientedx3, cranial nerves grossly intact. moves all 4 extremities w/o difficulty. Affect pleasant   ECG: NSR 62 LAFB No st-T wave abnormality Personally reviewed   ASSESSMENT & PLAN:  1. H/O Cardiac (VT arrest in setting of acute inferolateral STEMI) - OOH cardiac arrest on 02/15/20 with prolonged CPR, resuscitated w/ CPR + defibrillation   - Emergent cath 02/15/20 w/ totally occluded mid LCx treated w/ PCI + DES - Recurrent VT on 1/14 w/ defib x1  - Now off amiodarone. No recurrent VT - Continue carvedilol 3.125 mg bid.   2. Chronic Systolic HF>>Diastolic Heart Failure  - EF (1/58/30): 25% in cath lab post cardiac arrest in setting of acute MI  - Repeat limited Echo post PCI (02/16/20): EF 50-55%. RV normal - Doing well NYHA I-II - Volume status ok  - Continue Entresto 49-51 mg bid   - Continue spironolactone 12.5 mg daily. - Continue Jardiance 10 mg daily  - Continue Coreg 3.125 mg bid  - BP too soft to titrate meds - Repeat echo    3. CAD s/p STEMI   - Emergent cath 1/12 w/ totally occluded mid LCx treated w/ PCI + DES - There is moderate ostial LAD disease and significant disease in the distal LAD close to the apex.  No significant disease affecting the right coronary artery. - No s/s ischemia. Continue medical therapy.  - Continue DAPT + rosuvastatin. - Continue carvedilol. - Has referral to [facility] - Check lipids today. Goal LDL < 70   4. H/o CVA with ICH - MRI 1/22 with multiple small infarcts and small ICH (Likely cardioembolic from arrest). - L sided weakness resolved.   5. Orthostatic Hypotension - Resolved. No further symptoms - BP stable off midodrine  6. Pre-diabetes  -  HgbA1c 6.0. - Metformin recently discontinued - now on Jardiance 10 mg daily for CHF - PCP following   Arvilla Meres, MD 09/13/20

## 2020-09-13 NOTE — Patient Instructions (Signed)
Labs done today, your results will be available in MyChart, we will contact you for abnormal readings.  Your physician has requested that you have an echocardiogram. Echocardiography is a painless test that uses sound waves to create images of your heart. It provides your doctor with information about the size and shape of your heart and how well your heart's chambers and valves are working. This procedure takes approximately one hour. There are no restrictions for this procedure.  Please call our office in January to schedule your follow up appointment  If you have any questions or concerns before your next appointment please send [place] a message through [place] or call our office at [phone].    TO LEAVE A MESSAGE FOR THE NURSE SELECT OPTION 2, PLEASE LEAVE A MESSAGE INCLUDING: YOUR NAME DATE OF BIRTH CALL BACK NUMBER REASON FOR CALL**this is important as we prioritize the call backs  YOU WILL RECEIVE A CALL BACK THE SAME DAY AS LONG AS YOU CALL BEFORE 4:00 PM  milAt [facility], you and your health needs are our priority. As part of our continuing mission to provide you with exceptional heart care, we have created designated Provider Care Teams. These Care Teams include your primary Cardiologist (physician) and Advanced Practice Providers (APPs- Physician Assistants and Nurse Practitioners) who all work together to provide you with the care you need, when you need it.   You may see any of the following providers on your designated Care Team at your next follow up: Dr Arvilla Meres Dr Marca Ancona Dr Brandon Melnick, NP Robbie Lis, [place] Mikki Santee Karle Plumber, PharmD   Please be sure to bring in all your medications bottles to every appointment.

## 2020-09-17 ENCOUNTER — Telehealth (HOSPITAL_COMMUNITY): Payer: Self-pay | Admitting: *Deleted

## 2020-09-17 DIAGNOSIS — I2510 Atherosclerotic heart disease of native coronary artery without angina pectoris: Secondary | ICD-10-CM

## 2020-09-17 DIAGNOSIS — Z9861 Coronary angioplasty status: Secondary | ICD-10-CM

## 2020-09-17 DIAGNOSIS — E785 Hyperlipidemia, unspecified: Secondary | ICD-10-CM

## 2020-09-17 NOTE — Telephone Encounter (Signed)
Pt aware and agreeable, referral placed 

## 2020-09-17 NOTE — Telephone Encounter (Signed)
-----   Message from Dolores Patty, MD sent at 09/15/2020 11:37 PM EDT ----- LDL not at goal despite crestor and zetia. Please refer to lipid clinic  ----- Message ----- From: Interface, [facility] In [place] Sent: 09/13/2020   1:50 PM EDT To: Dolores Patty, MD

## 2020-09-26 ENCOUNTER — Other Ambulatory Visit: Payer: Self-pay

## 2020-09-26 ENCOUNTER — Ambulatory Visit (HOSPITAL_COMMUNITY)
Admission: RE | Admit: 2020-09-26 | Discharge: 2020-09-26 | Disposition: A | Discharge status: Home / Self Care | Payer: BC Managed Care – PPO | Source: Ambulatory Visit | Attending: Internal Medicine | Admitting: Internal Medicine

## 2020-09-26 DIAGNOSIS — I110 Hypertensive heart disease with heart failure: Secondary | ICD-10-CM | POA: Insufficient documentation

## 2020-09-26 DIAGNOSIS — I5022 Chronic systolic (congestive) heart failure: Secondary | ICD-10-CM | POA: Insufficient documentation

## 2020-09-26 DIAGNOSIS — I351 Nonrheumatic aortic (valve) insufficiency: Secondary | ICD-10-CM | POA: Diagnosis not present

## 2020-09-26 LAB — ECHOCARDIOGRAM COMPLETE
Area-P 1/2: 2.01 cm2
S' Lateral: 3.3 cm

## 2020-09-26 NOTE — Progress Notes (Signed)
  Echocardiogram 2D Echocardiogram has been performed.  Jeffrey Morse 09/26/2020, 9:55 AM

## 2020-10-01 ENCOUNTER — Other Ambulatory Visit: Payer: Self-pay

## 2020-10-01 ENCOUNTER — Ambulatory Visit (INDEPENDENT_AMBULATORY_CARE_PROVIDER_SITE_OTHER): Payer: BC Managed Care – PPO | Admitting: Pharmacist

## 2020-10-01 ENCOUNTER — Telehealth: Payer: Self-pay | Admitting: Pharmacist

## 2020-10-01 DIAGNOSIS — E785 Hyperlipidemia, unspecified: Secondary | ICD-10-CM | POA: Diagnosis not present

## 2020-10-01 DIAGNOSIS — I2121 ST elevation (STEMI) myocardial infarction involving left circumflex coronary artery: Secondary | ICD-10-CM | POA: Diagnosis not present

## 2020-10-01 DIAGNOSIS — I639 Cerebral infarction, unspecified: Secondary | ICD-10-CM | POA: Diagnosis not present

## 2020-10-01 MED ORDER — PRALUENT 75 MG/ML ~~LOC~~ SOAJ: 1.0000 "pen " | SUBCUTANEOUS | 3 refills | Status: DC

## 2020-10-01 NOTE — Progress Notes (Signed)
Patient ID: Jeffrey Morse                 DOB: 1956-01-09                    MRN: [id number]     HPI: Jeffrey Morse is a 65 y.o. male patient referred to lipid clinic by Dr. Gala Romney. 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 LDL above goal on rosuvastatin 40mg  and ezetimibe 10mg  daily. Patient referred to lipid clinic.  Patient presents today in good spirits. He is a very sweet gentleman who is happy the Lord kept him around. He is tolerating rosuvastatin and ezetimibe 10mg  well. Any issues in the past he believes was due to his bulging disk issues. Uses [facility] on [address].  Current Medications: rosuvastatin 40mg  and ezetimibe 10mg  daily Intolerances: none Risk Factors: CAD, CVA LDL goal: <70  Diet:  Breakfast: raisin bran, oatmeal Lunch: sandwich Dinner: fish, hamburger patty, peas Bread (weakness) Drink: sweet tea,   Exercise: walks about every other day- 1.5 miles takes about  Family History:  Family History  Problem Relation Age of Onset   Colon cancer Mother    Stroke Father     Social History: former smoker, No ETOH   Labs:09/13/20 TC 170, TG 93, HDL 56, LDL 95 non-HDL 114 (rosuvastatin 40mg  and ezetimibe 10mg  daily)  Past Medical History:  Diagnosis Date   HTN (hypertension)    Hypercholesteremia    Hypertension    Borderline hypertension   Sleep apnea    Stroke [facility])     Current Outpatient Medications on File Prior to Visit  Medication Sig Dispense Refill   acetaminophen (TYLENOL) 325 MG tablet Take 2 tablets (650 mg total) by mouth every 4 (four) hours as needed for mild pain (or temp > 37.5 C (99.5 F)).     Aspirin 81 MG CAPS 1 capsule     carvedilol (COREG) 3.125 MG tablet Take 1 tablet (3.125 mg total) by mouth 2 (two) times daily with a meal. 60 tablet 11   clopidogrel (PLAVIX) 75 MG tablet Take 1 tablet (75 mg total) by mouth daily. 30 tablet 11   empagliflozin (JARDIANCE) 10  MG TABS tablet Take 1 tablet (10 mg total) by mouth daily. 30 tablet 11   escitalopram (LEXAPRO) 20 MG tablet Take 20 mg by mouth daily.     ezetimibe (ZETIA) 10 MG tablet Take 1 tablet (10 mg total) by mouth daily. 90 tablet 3   Multiple Vitamin (MULTI VITAMIN) TABS      omeprazole (PRILOSEC) 20 MG capsule Take 1 capsule (20 mg total) by mouth daily. 30 capsule 0   rosuvastatin (CRESTOR) 40 MG tablet Take 1 tablet (40 mg total) by mouth daily. 30 tablet 11   sacubitril-valsartan (ENTRESTO) 49-51 MG Take 1 tablet by mouth 2 (two) times daily. 60 tablet 5   spironolactone (ALDACTONE) 25 MG tablet Take 1 tablet (25 mg total) by mouth daily. 90 tablet 3   No current facility-administered medications on file prior to visit.    Allergies  Allergen Reactions   Atorvastatin     Other reaction(s): back pain (10/2017)   Rosuvastatin Calcium     Other reaction(s): back pain (01/2018)    Assessment/Plan:  1. Hyperlipidemia - LDL is above goal of <70 (would even consider <55 ideal). Patient is willing to try PCSK9i. Discussed side effects, injection technique and cost. Reviewed its ability  to help with plaque regression and lower risk of future heart attacks and strokes. Will submit PA for Praluent 75mg  q 14 days, this is the preferred agent on his insurance. Continue rosuvastatin 40mg  daily and ezetimibe 10mg  daily.  Recheck lipids in 2 months. Will schedule once PA approved.  We discussed limiting desserts to once a week and cutting back on bread/sweet tea.  Thank you,   , Pharm.D, BCPS, CPP [facility]  [address], [place], [place] [address]  Phone: [phone]; Fax: [phone]

## 2020-10-01 NOTE — Telephone Encounter (Signed)
PA for Praluent approved through 10/01/2021. 90 DS supply sent to pharmacy bc patient thinks they have set out of pocket max for year. Will reset soon, but not sure when.

## 2020-10-01 NOTE — Patient Instructions (Addendum)
I will submit a prior authorization for Praluent.  I will call you once its approved.  Call me at [phone] with any questions

## 2020-10-02 NOTE — Telephone Encounter (Signed)
Pt is returning call.  

## 2020-10-02 NOTE — Telephone Encounter (Signed)
BIN# [id number] PCN# [id number] GRP# [id number] ID: [id number]  Copay card activated. Called pharmacy and gave them the information. Currently cost is $0 since he has hit his OOP max. I gave copay card info to pharmacy to keep on file.  Called pt and LVM for him to call back Will need to schedule labs

## 2020-10-04 NOTE — Telephone Encounter (Signed)
[place] message sent.

## 2020-10-09 ENCOUNTER — Telehealth (HOSPITAL_COMMUNITY): Payer: Self-pay

## 2020-10-11 ENCOUNTER — Encounter: Payer: Self-pay | Admitting: Adult Health

## 2020-10-11 ENCOUNTER — Encounter (HOSPITAL_COMMUNITY)
Admission: RE | Admit: 2020-10-11 | Discharge: 2020-10-11 | Disposition: A | Discharge status: Home / Self Care | Payer: BC Managed Care – PPO | Source: Ambulatory Visit | Attending: Internal Medicine | Admitting: Internal Medicine

## 2020-10-11 ENCOUNTER — Ambulatory Visit (INDEPENDENT_AMBULATORY_CARE_PROVIDER_SITE_OTHER): Payer: BC Managed Care – PPO | Admitting: Adult Health

## 2020-10-11 ENCOUNTER — Other Ambulatory Visit: Payer: Self-pay

## 2020-10-11 VITALS — BP 98/60 | HR 72 | Ht 69.75 in | Wt 202.6 lb

## 2020-10-11 VITALS — BP 101/61 | HR 62 | Ht 71.0 in | Wt 202.0 lb

## 2020-10-11 DIAGNOSIS — R519 Headache, unspecified: Secondary | ICD-10-CM

## 2020-10-11 DIAGNOSIS — I63423 Cerebral infarction due to embolism of bilateral anterior cerebral arteries: Secondary | ICD-10-CM

## 2020-10-11 DIAGNOSIS — I2121 ST elevation (STEMI) myocardial infarction involving left circumflex coronary artery: Secondary | ICD-10-CM | POA: Insufficient documentation

## 2020-10-11 DIAGNOSIS — Z955 Presence of coronary angioplasty implant and graft: Secondary | ICD-10-CM | POA: Insufficient documentation

## 2020-10-11 DIAGNOSIS — G4733 Obstructive sleep apnea (adult) (pediatric): Secondary | ICD-10-CM | POA: Diagnosis not present

## 2020-10-11 NOTE — Patient Instructions (Signed)
Continue aspirin 81 mg daily  and Crestor, Zetia, Praluent  for secondary stroke prevention  Continue to follow up with PCP regarding cholesterol and blood pressure management  Maintain strict control of hypertension with blood pressure goal below 130/90 and cholesterol with LDL cholesterol (bad cholesterol) goal below 70 mg/dL.   Increase water intake!!   You will be called to schedule sleep evaluation with one of our sleep specialist  Continue to monitor headaches - if reoccurs, please let me know      Followup in the future with me in 6 months or call earlier if needed      Thank you for coming to see [place] at [facility]. I hope we have been able to provide you high quality care today.  You may receive a patient satisfaction survey over the next few weeks. We would appreciate your feedback and comments so that we may continue to improve ourselves and the health of our patients.

## 2020-10-11 NOTE — Progress Notes (Signed)
Cardiac Individual Treatment Plan  Patient Details  Name: Jeffrey Morse MRN: [id number] Date of Birth: [birth year] Referring Provider:   Flowsheet Row CARDIAC REHAB PHASE II ORIENTATION from 10/11/2020 in [facility] CARDIAC REHAB  Referring Provider Dr. Arvilla Meres, MD       Initial Encounter Date:  Flowsheet Row CARDIAC REHAB PHASE II ORIENTATION from 10/11/2020 in [facility] CARDIAC REHAB  Date 10/11/20       Visit Diagnosis: 02/15/20 STEMI  02/15/20 S/P PCI/DES LCfx  Patient's Home Medications on Admission:  Current Outpatient Medications:    acetaminophen (TYLENOL) 325 MG tablet, Take 2 tablets (650 mg total) by mouth every 4 (four) hours as needed for mild pain (or temp > 37.5 C (99.5 F))., Disp: , Rfl:    Alirocumab (PRALUENT) 75 MG/ML SOAJ, Inject 1 pen into the skin every 14 (fourteen) days., Disp: 6 mL, Rfl: 3   aspirin EC 81 MG tablet, Take 81 mg by mouth daily. Swallow whole., Disp: , Rfl:    carvedilol (COREG) 3.125 MG tablet, Take 1 tablet (3.125 mg total) by mouth 2 (two) times daily with a meal., Disp: 60 tablet, Rfl: 11   clopidogrel (PLAVIX) 75 MG tablet, Take 1 tablet (75 mg total) by mouth daily., Disp: 30 tablet, Rfl: 11   empagliflozin (JARDIANCE) 10 MG TABS tablet, Take 1 tablet (10 mg total) by mouth daily., Disp: 30 tablet, Rfl: 11   escitalopram (LEXAPRO) 20 MG tablet, Take 20 mg by mouth daily., Disp: , Rfl:    ezetimibe (ZETIA) 10 MG tablet, Take 1 tablet (10 mg total) by mouth daily., Disp: 90 tablet, Rfl: 3   Multiple Vitamin (MULTI VITAMIN) TABS, Take 1 tablet by mouth daily. Centrum silver, Disp: , Rfl:    omeprazole (PRILOSEC) 20 MG capsule, Take 1 capsule (20 mg total) by mouth daily., Disp: 30 capsule, Rfl: 0   sacubitril-valsartan (ENTRESTO) 49-51 MG, Take 1 tablet by mouth 2 (two) times daily., Disp: 60 tablet, Rfl: 5   spironolactone (ALDACTONE) 25 MG tablet, Take 1 tablet (25 mg total) by mouth daily.,  Disp: 90 tablet, Rfl: 3   rosuvastatin (CRESTOR) 40 MG tablet, Take 1 tablet (40 mg total) by mouth daily., Disp: 30 tablet, Rfl: 11  Past Medical History: Past Medical History:  Diagnosis Date   HTN (hypertension)    Hypercholesteremia    Hypertension    Borderline hypertension   Sleep apnea    Stroke (HCC)     Tobacco Use: Social History   Tobacco Use  Smoking Status Former  Smokeless Tobacco Never  Tobacco Comments   social smoker in high School    Labs: Recent Review Flowsheet Data     Labs for ITP Cardiac and Pulmonary Rehab Latest Ref Rng & Units 02/23/2020 02/24/2020 02/26/2020 07/16/2020 09/13/2020   Cholestrol 0 - 200 mg/dL - - - 144 818   LDLCALC 0 - 99 mg/dL - - - 563(J) 95   HDL >49 mg/dL - - - 45 56   Trlycerides <150 mg/dL - - - 702 93   Hemoglobin A1c 4.8 - 5.6 % - - - - -   PHART 7.350 - 7.450 - - 7.489(H) - -   PCO2ART 32.0 - 48.0 mmHg - - 31.7(L) - -   HCO3 20.0 - 28.0 mmol/L - - 24.2 - -   TCO2 22 - 32 mmol/L - - 25 - -   ACIDBASEDEF 0.0 - 2.0 mmol/L - - - - -  O2SAT % 71.7 66.3 94.0 - -       Capillary Blood Glucose: Lab Results  Component Value Date   GLUCAP 114 (H) 04/04/2020   GLUCAP 102 (H) 03/09/2020   GLUCAP 128 (H) 03/08/2020   GLUCAP 155 (H) 03/08/2020   GLUCAP 156 (H) 03/08/2020     Exercise Target Goals: Exercise Program Goal: Individual exercise prescription set using results from initial 6 min walk test and THRR while considering  patient's activity barriers and safety.   Exercise Prescription Goal: Starting with aerobic activity 30 plus minutes a day, 3 days per week for initial exercise prescription. Provide home exercise prescription and guidelines that participant acknowledges understanding prior to discharge.  Activity Barriers & Risk Stratification:  Activity Barriers & Cardiac Risk Stratification - 10/11/20 1115       Activity Barriers & Cardiac Risk Stratification   Activity Barriers Back Problems    Cardiac Risk  Stratification High             6 Minute Walk:  6 Minute Walk     Row Name 10/11/20 1112         6 Minute Walk   Phase Initial     Distance 1865 feet     Walk Time 6 minutes     # of Rest Breaks 0     MPH 3.53     METS 3.98     RPE 11     Perceived Dyspnea  0     VO2 Peak 13.93     Symptoms No     Resting HR 72 bpm     Resting BP 98/60     Resting Oxygen Saturation  97 %     Exercise Oxygen Saturation  during 6 min walk 97 %     Max Ex. HR 89 bpm     Max Ex. BP 122/70     2 Minute Post BP 102/70              Oxygen Initial Assessment:   Oxygen Re-Evaluation:   Oxygen Discharge (Final Oxygen Re-Evaluation):   Initial Exercise Prescription:  Initial Exercise Prescription - 10/11/20 1100       Date of Initial Exercise RX and Referring Provider   Date 10/11/20    Referring Provider Dr. Arvilla Meres, MD    Expected Discharge Date 12/07/20      NuStep   Level 3    SPM 80    Minutes 15    METs 2.5      Arm Ergometer   Level 2    RPM 60    Minutes 15      Prescription Details   Frequency (times per week) 3    Duration Progress to 30 minutes of continuous aerobic without signs/symptoms of physical distress      Intensity   THRR 40-80% of Max Heartrate 63-126    Ratings of Perceived Exertion 11-13    Perceived Dyspnea 0-4      Progression   Progression Continue progressive overload as per policy without signs/symptoms or physical distress.      Resistance Training   Training Prescription Yes    Weight 4    Reps 10-15             Perform Capillary Blood Glucose checks as needed.  Exercise Prescription Changes:   Exercise Comments:   Exercise Goals and Review:   Exercise Goals     Row Name 10/11/20 1122  Exercise Goals   Increase Physical Activity Yes       Intervention Provide advice, education, support and counseling about physical activity/exercise needs.;Develop an individualized exercise  prescription for aerobic and resistive training based on initial evaluation findings, risk stratification, comorbidities and participant's personal goals.       Expected Outcomes Short Term: Attend rehab on a regular basis to increase amount of physical activity.;Long Term: Add in home exercise to make exercise part of routine and to increase amount of physical activity.;Long Term: Exercising regularly at least 3-5 days a week.       Increase Strength and Stamina Yes       Intervention Provide advice, education, support and counseling about physical activity/exercise needs.;Develop an individualized exercise prescription for aerobic and resistive training based on initial evaluation findings, risk stratification, comorbidities and participant's personal goals.       Expected Outcomes Short Term: Increase workloads from initial exercise prescription for resistance, speed, and METs.;Short Term: Perform resistance training exercises routinely during rehab and add in resistance training at home;Long Term: Improve cardiorespiratory fitness, muscular endurance and strength as measured by increased METs and functional capacity ( )       Able to understand and use rate of perceived exertion (RPE) scale Yes       Intervention Provide education and explanation on how to use RPE scale       Expected Outcomes Short Term: Able to use RPE daily in rehab to express subjective intensity level;Long Term:  Able to use RPE to guide intensity level when exercising independently       Knowledge and understanding of Target Heart Rate Range (THRR) Yes       Intervention Provide education and explanation of THRR including how the numbers were predicted and where they are located for reference       Expected Outcomes Short Term: Able to state/look up THRR;Long Term: Able to use THRR to govern intensity when exercising independently;Short Term: Able to use daily as guideline for intensity in rehab       Understanding of  Exercise Prescription Yes       Intervention Provide education, explanation, and written materials on patient's individual exercise prescription       Expected Outcomes Short Term: Able to explain program exercise prescription;Long Term: Able to explain home exercise prescription to exercise independently                Exercise Goals Re-Evaluation :    Discharge Exercise Prescription (Final Exercise Prescription Changes):   Nutrition:  Target Goals: Understanding of nutrition guidelines, daily intake of sodium 1500mg , cholesterol 200mg , calories 30% from fat and 7% or less from saturated fats, daily to have 5 or more servings of fruits and vegetables.  Biometrics:  Pre Biometrics - 10/11/20 1109       Pre Biometrics   Waist Circumference 42 inches    Hip Circumference 43 inches    Waist to Hip Ratio 0.98 %    Triceps Skinfold 22 mm    % Body Fat 30.3 %    Grip Strength 35 kg    Flexibility 12.75 in    Single Leg Stand 28.8 seconds              Nutrition Therapy Plan and Nutrition Goals:   Nutrition Assessments:  MEDIFICTS Score Key: ?70 Need to make dietary changes  40-70 Heart Healthy Diet ? 40 Therapeutic Level Cholesterol Diet   Picture Your Plate Scores: <40 Unhealthy dietary pattern with  much room for improvement. 41-50 Dietary pattern unlikely to meet recommendations for good health and room for improvement. 51-60 More healthful dietary pattern, with some room for improvement.  >60 Healthy dietary pattern, although there may be some specific behaviors that could be improved.    Nutrition Goals Re-Evaluation:   Nutrition Goals Discharge (Final Nutrition Goals Re-Evaluation):   Psychosocial: Target Goals: Acknowledge presence or absence of significant depression and/or stress, maximize coping skills, provide positive support system. Participant is able to verbalize types and ability to use techniques and skills needed for reducing stress and  depression.  Initial Review & Psychosocial Screening:  Initial Psych Review & Screening - 10/11/20 1548       Initial Review   Current issues with Current Stress Concerns    Source of Stress Concerns Financial;Unable to perform yard/household activities    Comments Jeffrey Morse denies being depressed. Jeffrey Morse says he will not be able to return to his previous job. Jeffrey Morse admits to [employer] some stress regarding retirement/ disabilty.      Family Dynamics   Good Support System? Yes   Jeffrey Morse has his wife for support     Barriers   Psychosocial barriers to participate in program The patient should benefit from training in stress management and relaxation.      Screening Interventions   Interventions Encouraged to exercise    Expected Outcomes Long Term Goal: Stressors or current issues are controlled or eliminated.             Quality of Life Scores:  Quality of Life - 10/11/20 1123       Quality of Life   Select Quality of Life      Quality of Life Scores   Health/Function Pre 26.8 %    Socioeconomic Pre 20.25 %    Psych/Spiritual Pre 27.43 %    Family Pre 30 %    GLOBAL Pre 25.89 %            Scores of 19 and below usually indicate a poorer quality of life in these areas.  A difference of  2-3 points is a clinically meaningful difference.  A difference of 2-3 points in the total score of the Quality of Life Index has been associated with significant improvement in overall quality of life, self-image, physical symptoms, and general health in studies assessing change in quality of life.  PHQ-9: Recent Review Flowsheet Data     [facility] 10/11/2020 04/24/2020 03/20/2020   Decreased Interest 0 0 0   Down, Depressed, Hopeless 0 0 0   PHQ - 2 Score 0 0 0   Altered sleeping - - 0   Tired, decreased energy - - 3   Change in appetite - - 0   Feeling bad or failure about yourself  - - 0   Trouble concentrating - - 0   Moving slowly or fidgety/restless - - 0   PHQ-9 Score  - - 3      Interpretation of Total Score  Total Score Depression Severity:  1-4 = Minimal depression, 5-9 = Mild depression, 10-14 = Moderate depression, 15-19 = Moderately severe depression, 20-27 = Severe depression   Psychosocial Evaluation and Intervention:   Psychosocial Re-Evaluation:   Psychosocial Discharge (Final Psychosocial Re-Evaluation):   Vocational Rehabilitation: Provide vocational rehab assistance to qualifying candidates.   Vocational Rehab Evaluation & Intervention:  Vocational Rehab - 10/11/20 1551       Initial Vocational Rehab Evaluation & Intervention   Assessment shows  need for Vocational Rehabilitation No   Jeffrey Morse is not working currently and is in the process of applying for disability            Education: Education Goals: Education classes will be provided on a weekly basis, covering required topics. Participant will state understanding/return demonstration of topics presented.  Learning Barriers/Preferences:  Learning Barriers/Preferences - 10/11/20 1124       Learning Barriers/Preferences   Learning Barriers Hearing;Sight    Learning Preferences Group Instruction;Individual Instruction;Pictoral;Skilled Demonstration;Written Material;Video             Education Topics: Hypertension, Hypertension Reduction -Define heart disease and high blood pressure. Discus how high blood pressure affects the body and ways to reduce high blood pressure.   Exercise and Your Heart -Discuss why it is important to exercise, the FITT principles of exercise, normal and abnormal responses to exercise, and how to exercise safely.   Angina -Discuss definition of angina, causes of angina, treatment of angina, and how to decrease risk of having angina.   Cardiac Medications -Review what the following cardiac medications are used for, how they affect the body, and side effects that may occur when taking the medications.  Medications include Aspirin, Beta  blockers, calcium channel blockers, ACE Inhibitors, angiotensin receptor blockers, diuretics, digoxin, and antihyperlipidemics.   Congestive Heart Failure -Discuss the definition of CHF, how to live with CHF, the signs and symptoms of CHF, and how keep track of weight and sodium intake.   Heart Disease and Intimacy -Discus the effect sexual activity has on the heart, how changes occur during intimacy as we age, and safety during sexual activity.   Smoking Cessation / COPD -Discuss different methods to quit smoking, the health benefits of quitting smoking, and the definition of COPD.   Nutrition I: Fats -Discuss the types of cholesterol, what cholesterol does to the heart, and how cholesterol levels can be controlled.   Nutrition II: Labels -Discuss the different components of food labels and how to read food label   Heart Parts/Heart Disease and PAD -Discuss the anatomy of the heart, the pathway of blood circulation through the heart, and these are affected by heart disease.   Stress I: Signs and Symptoms -Discuss the causes of stress, how stress may lead to anxiety and depression, and ways to limit stress.   Stress II: Relaxation -Discuss different types of relaxation techniques to limit stress.   Warning Signs of Stroke / TIA -Discuss definition of a stroke, what the signs and symptoms are of a stroke, and how to identify when someone is having stroke.   Knowledge Questionnaire Score:  Knowledge Questionnaire Score - 10/11/20 1125       Knowledge Questionnaire Score   Pre Score 19/24             Core Components/Risk Factors/Patient Goals at Admission:  Personal Goals and Risk Factors at Admission - 10/11/20 1125       Core Components/Risk Factors/Patient Goals on Admission    Weight Management Yes;Weight Maintenance    Intervention Weight Management: Develop a combined nutrition and exercise program designed to reach desired caloric intake, while  maintaining appropriate intake of nutrient and fiber, sodium and fats, and appropriate energy expenditure required for the weight goal.;Weight Management: Provide education and appropriate resources to help participant work on and attain dietary goals.    Expected Outcomes Weight Maintenance: Understanding of the daily nutrition guidelines, which includes 25-35% calories from fat, 7% or less cal from saturated fats, less than  200mg  cholesterol, less than 1.5gm of sodium, & 5 or more servings of fruits and vegetables daily    Heart Failure Yes    Intervention Provide a combined exercise and nutrition program that is supplemented with education, support and counseling about heart failure. Directed toward relieving symptoms such as shortness of breath, decreased exercise tolerance, and extremity edema.    Expected Outcomes Short term: Attendance in program 2-3 days a week with increased exercise capacity. Reported lower sodium intake. Reported increased fruit and vegetable intake. Reports medication compliance.;Short term: Daily weights obtained and reported for increase. Utilizing diuretic protocols set by physician.;Long term: Adoption of self-care skills and reduction of barriers for early signs and symptoms recognition and intervention leading to self-care maintenance.    Hypertension Yes    Intervention Provide education on lifestyle modifcations including regular physical activity/exercise, weight management, moderate sodium restriction and increased consumption of fresh fruit, vegetables, and low fat dairy, alcohol moderation, and smoking cessation.;Monitor prescription use compliance.    Expected Outcomes Short Term: Continued assessment and intervention until BP is < 140/73mm HG in hypertensive participants. < 130/53mm HG in hypertensive participants with diabetes, heart failure or chronic kidney disease.;Long Term: Maintenance of blood pressure at goal levels.    Lipids Yes    Intervention Provide  education and support for participant on nutrition & aerobic/resistive exercise along with prescribed medications to achieve LDL 70mg , HDL >40mg .    Expected Outcomes Short Term: Participant states understanding of desired cholesterol values and is compliant with medications prescribed. Participant is following exercise prescription and nutrition guidelines.;Long Term: Cholesterol controlled with medications as prescribed, with individualized exercise RX and with personalized nutrition plan. Value goals: LDL < 70mg , HDL > 40 mg.    Stress Yes    Intervention Offer individual and/or small group education and counseling on adjustment to heart disease, stress management and health-related lifestyle change. Teach and support self-help strategies.;Refer participants experiencing significant psychosocial distress to appropriate mental health specialists for further evaluation and treatment. When possible, include family members and significant others in education/counseling sessions.    Expected Outcomes Short Term: Participant demonstrates changes in health-related behavior, relaxation and other stress management skills, ability to obtain effective social support, and compliance with psychotropic medications if prescribed.;Long Term: Emotional wellbeing is indicated by absence of clinically significant psychosocial distress or social isolation.    Personal Goal Other Yes    Personal Goal Short term: wants to return to hunting and fishing. Long term: Increase stamina and strength to pick up grandkids    Intervention Will continue to monitor pt and progress workloads as tolerated without sign or symptom    Expected Outcomes Pt will achieve his goals             Core Components/Risk Factors/Patient Goals Review:    Core Components/Risk Factors/Patient Goals at Discharge (Final Review):    ITP Comments:  ITP Comments     Row Name 10/11/20 1547           ITP Comments Dr MD, Medical  Director                Comments:Milton attended orientation on 10/11/2020 to review rules and guidelines for program.  Completed 6 minute walk test, Intitial ITP, and exercise prescription.  VSS. Telemetry-Sinus Rhythm downward QRS.  Asymptomatic. Safety measures and social distancing in place per CDC guidelines. Armanda Magic, RN,BSN 10/11/2020 4:01 PM

## 2020-10-11 NOTE — Progress Notes (Signed)
[facility] [address] [place]. [place] [address] [phone]       STROKE FOLLOW UP NOTE  [name] Date of Birth:  [birth year] Medical Record Number:  [id number]   Reason for Referral: stroke follow up    SUBJECTIVE:   CHIEF COMPLAINT:  Chief Complaint  Patient presents with   Follow-up    RM 1 with spouse Darlene  Pt is well and stable, no complaints      HPI:   Today, 10/11/2020, Jeffrey Morse returns for 38-month stroke follow-up accompanied by his wife.  Overall stable. Still has some imbalance but started working with cardiac rehab today. Occassional left arm weakness/heaviness with heavier objects. Reports about 1 month ago experienced left sided intermittent throbbing pressure sensation on/off lasting approx 2 weeks - denies actual headache.  Denies any visual changes, stroke/TIA symptoms, photophobia, phonophobia or N/V.  No history of headaches or migraines.  No additional symptoms since that time. Tried Tylenol with some benefit.  He does admit to poor sleep over the past month and limited to no water intake.  Reports prior diagnosis of sleep apnea greater than 10 years ago without formal treatment.  Denies new stroke/TIA symptoms.  Remains on aspirin and Plavix as well as Crestor, Zetia and Praluent.  Blood pressure today 101/61.  Continue to routinely follow with cardiology and PCP.  No further concerns at this time.     History provided for reference purposes only Initial visit 04/10/2020 [name] is being seen for hospital follow-up accompanied by his wife.  He was discharged home from CIR on 03/09/2020.  He reports he has been doing well since discharge- per patient, denies residual deficits but per wife he continues to experience very mild left arm weakness.  Completed OT and still working with PT for mild unsteadiness Denies residual cognitive impairment or visual concerns (previously c/o blurred vision)  questions if he can  return back to driving  He has not returned back to working previously self-employed replacing large Immunologist units on rooftops -he is aware that you will likely not be able to return back to this type of work as a consists of climbing high heights and lifting heavy objects Denies new or worsening stroke/TIA symptoms  Compliant on aspirin and Plavix -denies side effects Compliant on Crestor 40 mg daily -denies side effects Blood pressure today 134/85 -monitors at home and typically stable  He has had follow-up with cardiology as scheduled follow-up visit 04/30/2020  No new concerns at this time  Stroke admission 02/15/2020 Jeffrey Morse is a 65 y.o. male with history of hypertension, hyperlipidemia and OSA who presented to [facility] 02/15/20 with cardiac arrest with successful PCI and drug-eluting stent placement left circumflex complicated by hypotension and placed on Impella device which was removed 1/15.  On 1/16, found to have left-sided weakness.  MRI showed multiple small infarcts with frontal lobe, and right thalamic with micro-hemorrhage vs hemorrhagic transformation, likely due to recent PCI as well as embolization from distal clot on the Impella device. VAS [place] upper extremity showed acute superficial vein thrombosis involving the right cephalic vein.  Recommended DAPT with aspirin and Plavix.  LDL 135 (on simvastatin PTA and switch to Crestor 40 mg daily).  A1c 6.1.  On 1/22, found to have delirium.  CT head negative.  EEG negative for seizures.  Neurology reconsulted and recommended Seroquel and Depakote twice daily.  Delirium felt to be due to encephalopathy from multiple  sources including medical condition as well as medication effect.  Residual stroke deficit of cognitive impairment and mild left hemiparesis.   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  As well as embolization from  distal clot on the Impella device  CT head: Subcentimeter hyperdense focus within the right thalamic region, suspicious for a small acute bleed. MRI correlation is recommended. Mild cerebral atrophy.  MRI : Multiple small acute infarcts within the frontal lobes. New right thalamic microhemorrhage, since 01/18/12. MRA : Normal intracranial MRA. Carotid Doppler: Bilateral mild thickening without hemodynamically significant stenosis.  Vertebral artery flow is antegrade on the left not visualized on the right. 2D Echo: EF 50 - 55%. No cardiac source of emboli identified.  VAS [place] upper extremity: Acute superficial vein thrombosis involving the right cephalic vein. Sars Corona Virus 2 - negative LDL 135 HgbA1c 6.1 VTE prophylaxis - none No antithrombotic prior to admission, now on aspirin 81 mg daily and clopidogrel 75 mg daily.  Patient will be counseled to be compliant with his antithrombotic medications Ongoing aggressive stroke risk factor management Therapy recommendations: CIR Disposition:  d/c to CIR 02/28/2020    ROS:   14 system review of systems performed and negative with exception of those listed in HPI  PMH:  Past Medical History:  Diagnosis Date   HTN (hypertension)    Hypercholesteremia    Hypertension    Borderline hypertension   Sleep apnea    Stroke (HCC)     PSH:  Past Surgical History:  Procedure Laterality Date   BACK SURGERY     CARDIAC CATHETERIZATION     CORONARY STENT INTERVENTION N/A 02/15/2020   Procedure: CORONARY STENT INTERVENTION;  Surgeon: Iran Ouch, MD;  Location: [facility];  Service: Cardiovascular;  Laterality: N/A;  CFX   KNEE SURGERY Left 2012   LEFT HEART CATH AND CORONARY ANGIOGRAPHY N/A 02/15/2020   Procedure: LEFT HEART CATH AND CORONARY ANGIOGRAPHY;  Surgeon: Iran Ouch, MD;  Location: [facility];  Service: Cardiovascular;  Laterality: N/A;   RIGHT HEART CATH N/A 02/15/2020   Procedure: RIGHT HEART CATH;   Surgeon: Iran Ouch, MD;  Location: [facility];  Service: Cardiovascular;  Laterality: N/A;   VENTRICULAR ASSIST DEVICE INSERTION N/A 02/15/2020   Procedure: VENTRICULAR ASSIST DEVICE INSERTION;  Surgeon: Iran Ouch, MD;  Location: [facility];  Service: Cardiovascular;  Laterality: N/A;  iMPELLA     Social History:  Social History   Socioeconomic History   Marital status: Married    Spouse name: Not on file   Number of children: Not on file   Years of education: Not on file   Highest education level: Not on file  Occupational History   Not on file  Tobacco Use   Smoking status: Former   Smokeless tobacco: Never   Tobacco comments:    social smoker in high School  Vaping Use   Vaping Use: Never used  Substance and Sexual Activity   Alcohol use: Not Currently   Drug use: Never   Sexual activity: Yes  Other Topics Concern   Not on file  Social History Narrative   ** Merged History Encounter **       Social Determinants of Health   Financial Resource Strain: Not on file  Food Insecurity: Not on file  Transportation Needs: Not on file  Physical Activity: Not on file  Stress: Not  on file  Social Connections: Not on file  Intimate Partner Violence: Not on file    Family History:  Family History  Problem Relation Age of Onset   Colon cancer Mother    Stroke Father     Medications:   Current Outpatient Medications on File Prior to Visit  Medication Sig Dispense Refill   acetaminophen (TYLENOL) 325 MG tablet Take 2 tablets (650 mg total) by mouth every 4 (four) hours as needed for mild pain (or temp > 37.5 C (99.5 F)).     Alirocumab (PRALUENT) 75 MG/ML SOAJ Inject 1 pen into the skin every 14 (fourteen) days. 6 mL 3   carvedilol (COREG) 3.125 MG tablet Take 1 tablet (3.125 mg total) by mouth 2 (two) times daily with a meal. 60 tablet 11   clopidogrel (PLAVIX) 75 MG tablet Take 1 tablet (75 mg total) by mouth daily. 30 tablet 11    empagliflozin (JARDIANCE) 10 MG TABS tablet Take 1 tablet (10 mg total) by mouth daily. 30 tablet 11   escitalopram (LEXAPRO) 20 MG tablet Take 20 mg by mouth daily.     ezetimibe (ZETIA) 10 MG tablet Take 1 tablet (10 mg total) by mouth daily. 90 tablet 3   Multiple Vitamin (MULTI VITAMIN) TABS Take 1 tablet by mouth daily. Centrum silver     omeprazole (PRILOSEC) 20 MG capsule Take 1 capsule (20 mg total) by mouth daily. 30 capsule 0   rosuvastatin (CRESTOR) 40 MG tablet Take 1 tablet (40 mg total) by mouth daily. 30 tablet 11   sacubitril-valsartan (ENTRESTO) 49-51 MG Take 1 tablet by mouth 2 (two) times daily. 60 tablet 5   spironolactone (ALDACTONE) 25 MG tablet Take 1 tablet (25 mg total) by mouth daily. 90 tablet 3   No current facility-administered medications on file prior to visit.    Allergies:   Allergies  Allergen Reactions   Atorvastatin     Other reaction(s): back pain (10/2017)   Rosuvastatin Calcium     Other reaction(s): back pain (01/2018)      OBJECTIVE:  Physical Exam  Vitals:   10/11/20 1350  BP: 101/61  Pulse: 62  Weight: 202 lb (91.6 kg)  Height: 5\' 11"  (1.803 m)    Body mass index is 28.17 kg/m. No results found.   General: well developed, well nourished,  very pleasant middle-age Caucasian male, seated, in no evident distress Head: head normocephalic and atraumatic.   Neck: supple with no carotid or supraclavicular bruits Cardiovascular: regular rate and rhythm, no murmurs Musculoskeletal: no deformity Skin:  no rash/petichiae Vascular:  Normal pulses all extremities   Neurologic Exam Mental Status: Awake and fully alert.   Fluent speech and language.  Oriented to place and time. Recent and remote memory intact. Attention span, concentration and fund of knowledge appropriate. Mood and affect appropriate.  Cranial Nerves: Pupils equal, briskly reactive to light. Extraocular movements full without nystagmus. Visual fields full to confrontation.  Hearing intact. Facial sensation intact. Face, tongue, palate moves normally and symmetrically.  Motor: Normal bulk and tone. Normal strength in all tested extremity muscles except slight decrease left hand dexterity.  Mildly orbits right arm over left arm. Sensory.: intact to touch , pinprick , position and vibratory sensation.  Coordination: Rapid alternating movements normal in all extremities. Finger-to-nose and heel-to-shin performed accurately bilaterally. Gait and Station: Arises from chair without difficulty. Stance is normal. Gait demonstrates normal stride length and mild imbalance with without use of assistive device.   Reflexes: 1+  and symmetric. Toes downgoing.    PERTIENT IMAGING  VAS [place] Lower extremity 02/21/2020 Summary:  BILATERAL:  - No evidence of deep vein thrombosis seen in the lower extremities,  bilaterally.  - No evidence of superficial venous thrombosis in the lower extremities,  bilaterally.  -No evidence of popliteal cyst, bilaterally.    CT Head wo contrast 02/20/2019 IMPRESSION: 1. Unchanged punctate focus of blood at the right thalamus. 2. Faint hypoattenuation in the frontal lobes correspond to known recent infarcts.   CT Head Wo Contrast 02/17/2020 IMPRESSION:  1. Subcentimeter hyperdense focus within the right thalamic region, suspicious for a small acute bleed. MRI correlation is recommended.  2. Mild cerebral atrophy.  3. Moderate to marked severity paranasal sinus disease.    CT Head Wo Contrast 02/26/2020 IMPRESSION: 1. Motion degraded head CT without new abnormality or reversible finding. 2. Improved sinus inflammation compared to 6 days ago.   EEG  02/26/2020   Clinical Interpretation: This EEG is consistent with a mild generalized nonspecific cerebral dysfunction (encephalopathy). There was no seizure or seizure predisposition recorded on this study. Please note that lack of epileptiform activity on EEG does not preclude the possibility  of epilepsy.    MR BRAIN WO CONTRAST MR ANGIO HEAD WO CONTRAST 02/17/2020   IMPRESSION:  1. Multiple small acute infarcts within the frontal lobes.  2. New right thalamic microhemorrhage, since 01/18/12.  3. Normal intracranial MRA.    DG CHEST PORT 1 VIEW 02/16/2020 IMPRESSION:  Low lung volumes persist, with likely atelectasis at the lung bases and no overt edema or confluent airspace disease. Unchanged position of endotracheal tube, gastric tube, right IJ sheath/Swan-Ganz.    DG Abd Portable 1V 02/17/2020 IMPRESSION:  Enteric tube in satisfactory position, nonobstructive bowel gas pattern.    ECHOCARDIOGRAM LIMITED 02/16/2020 IMPRESSIONS   1. Left ventricular ejection fraction, by estimation, is 50 to 55%. The left ventricle has mildly decreased function. The left ventricle demonstrates regional wall motion abnormalities. Posterior wall hypokinesis.   2. Impella measures about 3cm into LV cavity from aortic valve   3. Right ventricular systolic function is normal. The right ventricular size is normal.    Bilateral Carotid Dopplers  02/18/2020 Bilateral mild thickening without hemodynamically significant stenosis.  Vertebral artery flow is antegrade on the left not visualized on the right.   VAS [place] upper extremity Venous Duplex Right 02/20/2020   Summary:     Right:  No evidence of deep vein thrombosis in the upper extremity. Findings  consistent  with acute superficial vein thrombosis involving the right cephalic vein.     Left:  No evidence of thrombosis in the subclavian.        ASSESSMENT: Jeffrey Morse is a 65 y.o. year old male admitted on 02/15/2020 for cardiac arrest x2 s/p CPR with finding of STEMI s/p PCI and DES. On 1/16, evidence of left-sided weakness with MRI showing right thalamic microhemorrhage vs hemorrhagic transformation and several punctate infarcts bilateral frontal area, embolic likely secondary to procedures related CPR and PCI/stenting.  Vascular risk factors include HTN, HLD, and OSA.  No prior cardiac history until recent admission     PLAN:  R thalamic, b/l frontal infarcts :  Residual deficit: Mild decrease left hand dexterity and imbalance.  Recently started working with cardiac rehab with plans on working on further balance improvement continue aspirin 81 mg daily and clopidogrel 75 mg daily  and Crestor 40 mg daily, Zetia and Praluent for secondary stroke prevention and cardiac history.  Discussed secondary stroke prevention measures and importance of close PCP follow up for aggressive stroke risk factor management  Left-sided headache: Unknown etiology.  No red flag symptoms reported.  No recent recurrence in the past 2 weeks.  Discussed importance of adequate water intake which limited to no water intake possibly contributing.  Also referred to [facility] sleep clinic for prior history of sleep apnea diagnosed greater than 10 years ago without formal treatment. Will hold off on further evaluation of headache at this time but advised to call office with any recurrence HTN: BP goal <130/90.  Stable on current regimen although on lower side monitor by cardiology HLD: LDL goal <70. Recent LDL 95.  Recently started on Praluent by cardiology in addition to Zetia and Crestor DMII, new dx: A1c goal<7.0. Recent A1c 6.1 currently on Jardiance.  STEMI s/p PCI/DES: Routine follow-up with cardiology    Follow up in 6 months or call earlier if needed   CC:  [facility] provider: Dr. Pearlean Brownie PCP: Blair Heys, MD    I spent 38 minutes of face-to-face and non-face-to-face time with patient and wife.  This included previsit chart review, lab review, study review, order entry, electronic health record documentation, patient and wife education and discussion regarding prior stroke including likely etiology, minimal residual deficits, importance of managing stroke risk factors and secondary stroke prevention measures, recent left-sided headache  and possible contributing factors and answered all other questions to patient and wife's satisfaction   Ihor Austin, AGNP-BC  [facility] [address] [place], [place] [address]  Phone [phone] Fax [phone] Note: This document was prepared with digital dictation and possible smart phrase technology. Any transcriptional errors that result from this process are unintentional.

## 2020-10-11 NOTE — Progress Notes (Signed)
Cardiac Rehab Medication Review by a Nurse  Does the patient  feel that his/her medications are working for him/her?  yes  Has the patient been experiencing any side effects to the medications prescribed?  no  Does the patient measure his/her own blood pressure or blood glucose at home?  yes   Does the patient have any problems obtaining medications due to transportation or finances?   no  Understanding of regimen: good Understanding of indications: good Potential of compliance: excellent    Nurse comments: Azad is taking his medications as prescribed. Allin says that he worried that he may have trouble with medication expenses as he and his wife are living off of his wife's retirement income only right now. Christiano says he will ask for help if he needs to.    Thayer Headings RN BSN 10/11/2020 3:55 PM

## 2020-10-15 ENCOUNTER — Encounter (HOSPITAL_COMMUNITY)
Admission: RE | Admit: 2020-10-15 | Discharge: 2020-10-15 | Disposition: A | Discharge status: Home / Self Care | Payer: BC Managed Care – PPO | Source: Ambulatory Visit | Attending: Internal Medicine | Admitting: Internal Medicine

## 2020-10-15 ENCOUNTER — Other Ambulatory Visit: Payer: Self-pay

## 2020-10-15 DIAGNOSIS — I2121 ST elevation (STEMI) myocardial infarction involving left circumflex coronary artery: Secondary | ICD-10-CM | POA: Diagnosis present

## 2020-10-15 DIAGNOSIS — Z955 Presence of coronary angioplasty implant and graft: Secondary | ICD-10-CM | POA: Diagnosis present

## 2020-10-15 NOTE — Progress Notes (Signed)
Daily Session Note  Patient Details  Name: Jeffrey Morse MRN: [id number] Date of Birth: 1955-03-10 Referring Provider:   Flowsheet Row CARDIAC REHAB PHASE II ORIENTATION from 10/11/2020 in [place]  Referring Provider Dr. Glori Bickers, MD       Encounter Date: 10/15/2020  Check In:  Session Check In - 10/15/20 0935       Check-In   Supervising physician immediately available to respond to emergencies [facility] immediately available    Physician(s) Dr Florene Glen    Location [facility]    Staff Present Esmeralda Links BS, ACSM EP-C, Exercise Physiologist;Byrd Rushlow, RN, BSN;Carlette Carlton, RN, [employer], MS, ACSM-CEP, CCRP, Exercise Physiologist;Olinty Celesta Aver, MS, ACSM CEP, Exercise Physiologist    Virtual Visit No    Medication changes reported     No    Fall or balance concerns reported    No    Tobacco Cessation No Change    Current number of cigarettes/nicotine per day     0    Warm-up and Cool-down Performed on first and last piece of equipment   Cardiac Rehab Orientation   Resistance Training Performed Yes    VAD Patient? No    PAD/SET Patient? No      Pain Assessment   Currently in Pain? No/denies    Pain Score 0-No pain    Multiple Pain Sites No             Capillary Blood Glucose: No results found for this or any previous visit (from the past 24 hour(s)).   Exercise Prescription Changes - 10/15/20 1000       Response to Exercise   Blood Pressure (Admit) 108/62    Blood Pressure (Exercise) 124/74    Blood Pressure (Exit) 104/70    Heart Rate (Admit) 81 bpm    Heart Rate (Exercise) 79 bpm    Heart Rate (Exit) 75 bpm    Rating of Perceived Exertion (Exercise) 11    Symptoms None    Comments Pt's first day in the CRP2 program    Duration Continue with 30 min of aerobic exercise without signs/symptoms of physical distress.    Intensity THRR unchanged      Progression    Progression Continue to progress workloads to maintain intensity without signs/symptoms of physical distress.    Average METs 2.2      Resistance Training   Training Prescription Yes    Weight 4 lbs    Reps 10-15    Time 10 Minutes      Interval Training   Interval Training No      NuStep   Level 3    SPM 80    Minutes 15    METs 2.2      Arm Ergometer   Level 2    Watts 4    RPM 60    Minutes 15             Social History   Tobacco Use  Smoking Status Former  Smokeless Tobacco Never  Tobacco Comments   social smoker in high School    Goals Met:  Exercise tolerated well No report of concerns or symptoms today Weight training today  Goals Unmet:  Not Applicable  Comments: [name]  started cardiac rehab today.  Pt tolerated light exercise without difficulty. VSS, telemetry-Sinus Rhythm downward QRS, asymptomatic.  Medication list reconciled. Pt denies barriers to medicaiton compliance.  PSYCHOSOCIAL ASSESSMENT:  PHQ-0. Pt exhibits positive coping  skills, hopeful outlook with supportive family. No psychosocial needs identified at this time, no psychosocial interventions necessary.    Pt enjoys hunting and fishing.   Pt oriented to exercise equipment and routine.    Understanding verbalized. Barnet Pall, RN,BSN 10/15/2020 10:54 AM    Dr. Fransico Him is Medical Director for Cardiac Rehab at [facility].

## 2020-10-15 NOTE — Progress Notes (Signed)
Cardiac Individual Treatment Plan  Patient Details  Name: Jeffrey Morse MRN: [id number] Date of Birth: 1955-10-31 Referring Provider:   Flowsheet Row CARDIAC REHAB PHASE II ORIENTATION [facility] 10/11/2020 [facility]  Referring Provider Dr. Arvilla Meresaniel Bensimhon, MD       Initial Encounter Date:  Flowsheet Row CARDIAC REHAB PHASE II ORIENTATION [facility] 10[facility]11/2020 [facility]  Date 10/11/20       Visit Diagnosis: 02/15/20 STEMI  02/15/20 S/P PCI/DES LCfx  Patient's Home Medications on Admission:  Current Outpatient Medications:    acetaminophen (TYLENOL) 325 MG tablet, Take 2 tablets (650 mg total) by mouth every 4 (four) hours as needed for mild pain (or temp > 37.5 C (99.5 F))., Disp: , Rfl:    Alirocumab (PRALUENT) 75 MG/ML SOAJ, Inject 1 pen into the skin every 14 (fourteen) days., Disp: 6 mL, Rfl: 3   aspirin EC 81 MG tablet, Take 81 mg by mouth daily. Swallow whole., Disp: , Rfl:    carvedilol (COREG) 3.125 MG tablet, Take 1 tablet (3.125 mg total) by mouth 2 (two) times daily with a meal., Disp: 60 tablet, Rfl: 11   clopidogrel (PLAVIX) 75 MG tablet, Take 1 tablet (75 mg total) by mouth daily., Disp: 30 tablet, Rfl: 11   empagliflozin (JARDIANCE) 10 MG TABS tablet, Take 1 tablet (10 mg total) by mouth daily., Disp: 30 tablet, Rfl: 11   escitalopram (LEXAPRO) 20 MG tablet, Take 20 mg by mouth daily., Disp: , Rfl:    ezetimibe (ZETIA) 10 MG tablet, Take 1 tablet (10 mg total) by mouth daily., Disp: 90 tablet, Rfl: 3   Multiple Vitamin (MULTI VITAMIN) TABS, Take 1 tablet by mouth daily. Centrum silver, Disp: , Rfl:    omeprazole (PRILOSEC) 20 MG capsule, Take 1 capsule (20 mg total) by mouth daily., Disp: 30 capsule, Rfl: 0   rosuvastatin (CRESTOR) 40 MG tablet, Take 1 tablet (40 mg total) by mouth daily., Disp: 30 tablet, Rfl: 11   sacubitril-valsartan (ENTRESTO) 49-51 MG, Take 1 tablet by mouth 2 (two) times daily., Disp:  60 tablet, Rfl: 5   spironolactone (ALDACTONE) 25 MG tablet, Take 1 tablet (25 mg total) by mouth daily., Disp: 90 tablet, Rfl: 3  Past Medical History: Past Medical History:  Diagnosis Date   HTN (hypertension)    Hypercholesteremia    Hypertension    Borderline hypertension   Sleep apnea    Stroke (HCC)     Tobacco Use: Social History   Tobacco Use  Smoking Status Former  Smokeless Tobacco Never  Tobacco Comments   social smoker in high School    Labs: Recent Review Flowsheet Data     Labs for ITP Cardiac and Pulmonary Rehab Latest Ref Rng & Units 02/23/2020 02/24/2020 02/26/2020 07/16/2020 09/13/2020   Cholestrol 0 - 200 mg/dL - - - [id number]   LDLCALC 0 - 99 mg/dL - - - 657(Q104(H) 95   HDL >46>40 mg/dL - - - 45 56   Trlycerides <150 mg/dL - - - 962143 93   Hemoglobin A1c 4.8 - 5.6 % - - - - -   PHART 7.350 - 7.450 - - 7.489(H) - -   PCO2ART 32.0 - 48.0 mmHg - - 31.7(L) - -   HCO3 20.0 - 28.0 mmol/L - - 24.2 - -   TCO2 22 - 32 mmol/L - - 25 - -   ACIDBASEDEF 0.0 - 2.0 mmol/L - - - - -  O2SAT % 71.7 66.3 94.0 - -       Capillary Blood Glucose: Lab Results  Component Value Date   GLUCAP 114 (H) 04/04/2020   GLUCAP 102 (H) 03/09/2020   GLUCAP 128 (H) 03/08/2020   GLUCAP 155 (H) 03/08/2020   GLUCAP 156 (H) 03/08/2020     Exercise Target Goals: Exercise Program Goal: Individual exercise prescription set using results from initial 6 min walk test and THRR while considering  patient's activity barriers and safety.   Exercise Prescription Goal: Starting with aerobic activity 30 plus minutes a day, 3 days per week for initial exercise prescription. Provide home exercise prescription and guidelines that participant acknowledges understanding prior to discharge.  Activity Barriers & Risk Stratification:  Activity Barriers & Cardiac Risk Stratification - 10/11/20 1115       Activity Barriers & Cardiac Risk Stratification   Activity Barriers Back Problems    Cardiac Risk  Stratification High             6 Minute Walk:  6 Minute Walk     Row Name 10/11/20 1112         6 Minute Walk   Phase Initial     Distance 1865 feet     Walk Time 6 minutes     # of Rest Breaks 0     MPH 3.53     METS 3.98     RPE 11     Perceived Dyspnea  0     VO2 Peak 13.93     Symptoms No     Resting HR 72 bpm     Resting BP 98/60     Resting Oxygen Saturation  97 %     Exercise Oxygen Saturation  during 6 min walk 97 %     Max Ex. HR 89 bpm     Max Ex. BP 122/70     2 Minute Post BP 102/70              Oxygen Initial Assessment:   Oxygen Re-Evaluation:   Oxygen Discharge (Final Oxygen Re-Evaluation):   Initial Exercise Prescription:  Initial Exercise Prescription - 10/11/20 1100       Date of Initial Exercise RX and Referring Provider   Date 10/11/20    Referring Provider Dr. Arvilla Meres, MD    Expected Discharge Date 12/07/20      NuStep   Level 3    SPM 80    Minutes 15    METs 2.5      Arm Ergometer   Level 2    RPM 60    Minutes 15      Prescription Details   Frequency (times per week) 3    Duration Progress to 30 minutes of continuous aerobic without signs/symptoms of physical distress      Intensity   THRR 40-80% of Max Heartrate 63-126    Ratings of Perceived Exertion 11-13    Perceived Dyspnea 0-4      Progression   Progression Continue progressive overload as per policy without signs/symptoms or physical distress.      Resistance Training   Training Prescription Yes    Weight 4    Reps 10-15             Perform Capillary Blood Glucose checks as needed.  Exercise Prescription Changes:   Exercise Prescription Changes     Row Name 10/15/20 1000             Response to  Exercise   Blood Pressure (Admit) 108/62       Blood Pressure (Exercise) 124/74       Blood Pressure (Exit) 104/70       Heart Rate (Admit) 81 bpm       Heart Rate (Exercise) 79 bpm       Heart Rate (Exit) 75 bpm       Rating  of Perceived Exertion (Exercise) 11       Symptoms None       Comments Pt's first day in the CRP2 program       Duration Continue with 30 min of aerobic exercise without signs/symptoms of physical distress.       Intensity THRR unchanged               Progression   Progression Continue to progress workloads to maintain intensity without signs/symptoms of physical distress.       Average METs 2.2               Resistance Training   Training Prescription Yes       Weight 4 lbs       Reps 10-15       Time 10 Minutes               Interval Training   Interval Training No               NuStep   Level 3       SPM 80       Minutes 15       METs 2.2               Arm Ergometer   Level 2       Watts 4       RPM 60       Minutes 15                Exercise Comments:   Exercise Comments     Row Name 10/15/20 1034           Exercise Comments Pt's first day in the CRP2 program. Pt tolerated the session well with no complaints.                Exercise Goals and Review:   Exercise Goals     Row Name 10/11/20 1122             Exercise Goals   Increase Physical Activity Yes       Intervention Provide advice, education, support and counseling about physical activity/exercise needs.;Develop an individualized exercise prescription for aerobic and resistive training based on initial evaluation findings, risk stratification, comorbidities and participant's personal goals.       Expected Outcomes Short Term: Attend rehab on a regular basis to increase amount of physical activity.;Long Term: Add in home exercise to make exercise part of routine and to increase amount of physical activity.;Long Term: Exercising regularly at least 3-5 days a week.       Increase Strength and Stamina Yes       Intervention Provide advice, education, support and counseling about physical activity/exercise needs.;Develop an individualized exercise prescription for aerobic and resistive  training based on initial evaluation findings, risk stratification, comorbidities and participant's personal goals.       Expected Outcomes Short Term: Increase workloads from initial exercise prescription for resistance, speed, and METs.;Short Term: Perform resistance training exercises routinely during rehab and add in resistance training at home;Long Term: Improve cardiorespiratory  fitness, muscular endurance and strength as measured by increased METs and functional capacity ( )       Able to understand and use rate of perceived exertion (RPE) scale Yes       Intervention Provide education and explanation on how to use RPE scale       Expected Outcomes Short Term: Able to use RPE daily in rehab to express subjective intensity level;Long Term:  Able to use RPE to guide intensity level when exercising independently       Knowledge and understanding of Target Heart Rate Range (THRR) Yes       Intervention Provide education and explanation of THRR including how the numbers were predicted and where they are located for reference       Expected Outcomes Short Term: Able to state/look up THRR;Long Term: Able to use THRR to govern intensity when exercising independently;Short Term: Able to use daily as guideline for intensity in rehab       Understanding of Exercise Prescription Yes       Intervention Provide education, explanation, and written materials on patient's individual exercise prescription       Expected Outcomes Short Term: Able to explain program exercise prescription;Long Term: Able to explain home exercise prescription to exercise independently                Exercise Goals Re-Evaluation :  Exercise Goals Re-Evaluation     Row Name 10/15/20 1032             Exercise Goal Re-Evaluation   Exercise Goals Review Increase Physical Activity;Increase Strength and Stamina;Able to understand and use rate of perceived exertion (RPE) scale;Knowledge and understanding of Target Heart Rate  Range (THRR);Able to check pulse independently;Understanding of Exercise Prescription       Comments Pt's first day in the CRP2 program. Pt understands the exercise Rx, THRR, and RPE scale.       Expected Outcomes Will continue to monitor patient and progress exercise workloads as tolerated.                 Discharge Exercise Prescription (Final Exercise Prescription Changes):  Exercise Prescription Changes - 10/15/20 1000       Response to Exercise   Blood Pressure (Admit) 108/62    Blood Pressure (Exercise) 124/74    Blood Pressure (Exit) 104/70    Heart Rate (Admit) 81 bpm    Heart Rate (Exercise) 79 bpm    Heart Rate (Exit) 75 bpm    Rating of Perceived Exertion (Exercise) 11    Symptoms None    Comments Pt's first day in the CRP2 program    Duration Continue with 30 min of aerobic exercise without signs/symptoms of physical distress.    Intensity THRR unchanged      Progression   Progression Continue to progress workloads to maintain intensity without signs/symptoms of physical distress.    Average METs 2.2      Resistance Training   Training Prescription Yes    Weight 4 lbs    Reps 10-15    Time 10 Minutes      Interval Training   Interval Training No      NuStep   Level 3    SPM 80    Minutes 15    METs 2.2      Arm Ergometer   Level 2    Watts 4    RPM 60    Minutes 15  Nutrition:  Target Goals: Understanding of nutrition guidelines, daily intake of sodium 1500mg , cholesterol 200mg , calories 30% from fat and 7% or less from saturated fats, daily to have 5 or more servings of fruits and vegetables.  Biometrics:  Pre Biometrics - 10/11/20 1109       Pre Biometrics   Waist Circumference 42 inches    Hip Circumference 43 inches    Waist to Hip Ratio 0.98 %    Triceps Skinfold 22 mm    % Body Fat 30.3 %    Grip Strength 35 kg    Flexibility 12.75 in    Single Leg Stand 28.8 seconds              Nutrition Therapy Plan  and Nutrition Goals:   Nutrition Assessments:  MEDIFICTS Score Key: ?70 Need to make dietary changes  40-70 Heart Healthy Diet ? 40 Therapeutic Level Cholesterol Diet   Picture Your Plate Scores: <78 Unhealthy dietary pattern with much room for improvement. 41-50 Dietary pattern unlikely to meet recommendations for good health and room for improvement. 51-60 More healthful dietary pattern, with some room for improvement.  >60 Healthy dietary pattern, although there may be some specific behaviors that could be improved.    Nutrition Goals Re-Evaluation:   Nutrition Goals Discharge (Final Nutrition Goals Re-Evaluation):   Psychosocial: Target Goals: Acknowledge presence or absence of significant depression and/or stress, maximize coping skills, provide positive support system. Participant is able to verbalize types and ability to use techniques and skills needed for reducing stress and depression.  Initial Review & Psychosocial Screening:  Initial Psych Review & Screening - 10/11/20 1548       Initial Review   Current issues with Current Stress Concerns    Source of Stress Concerns Financial;Unable to perform yard/household activities    Comments Marcelo denies being depressed. Arcenio says he will not be able to return to his previous job. Coben admits to [employer] some stress regarding retirement/ disabilty.      Family Dynamics   Good Support System? Yes   Courtez has his wife for support     Barriers   Psychosocial barriers to participate in program The patient should benefit from training in stress management and relaxation.      Screening Interventions   Interventions Encouraged to exercise    Expected Outcomes Long Term Goal: Stressors or current issues are controlled or eliminated.             Quality of Life Scores:  Quality of Life - 10/11/20 1123       Quality of Life   Select Quality of Life      Quality of Life Scores   Health/Function Pre 26.8 %     Socioeconomic Pre 20.25 %    Psych/Spiritual Pre 27.43 %    Family Pre 30 %    GLOBAL Pre 25.89 %            Scores of 19 and below usually indicate a poorer quality of life in these areas.  A difference of  2-3 points is a clinically meaningful difference.  A difference of 2-3 points in the total score of the Quality of Life Index has been associated with significant improvement in overall quality of life, self-image, physical symptoms, and general health in studies assessing change in quality of life.  PHQ-9: Recent Review Flowsheet Data     Depression screen [facility] 2/9 10/11/2020 04/24/2020 03/20/2020   Decreased Interest 0 0 0  Down, Depressed, Hopeless 0 0 0   PHQ - 2 Score 0 0 0   Altered sleeping - - 0   Tired, decreased energy - - 3   Change in appetite - - 0   Feeling bad or failure about yourself  - - 0   Trouble concentrating - - 0   Moving slowly or fidgety/restless - - 0   PHQ-9 Score - - 3      Interpretation of Total Score  Total Score Depression Severity:  1-4 = Minimal depression, 5-9 = Mild depression, 10-14 = Moderate depression, 15-19 = Moderately severe depression, 20-27 = Severe depression   Psychosocial Evaluation and Intervention:   Psychosocial Re-Evaluation:   Psychosocial Discharge (Final Psychosocial Re-Evaluation):   Vocational Rehabilitation: Provide vocational rehab assistance to qualifying candidates.   Vocational Rehab Evaluation & Intervention:  Vocational Rehab - 10/11/20 1551       Initial Vocational Rehab Evaluation & Intervention   Assessment shows need for Vocational Rehabilitation No   Lynette is not working currently and is in the process of applying for disability            Education: Education Goals: Education classes will be provided on a weekly basis, covering required topics. Participant will state understanding/return demonstration of topics presented.  Learning Barriers/Preferences:  Learning Barriers/Preferences -  10/11/20 1124       Learning Barriers/Preferences   Learning Barriers Hearing;Sight    Learning Preferences Group Instruction;Individual Instruction;Pictoral;Skilled Demonstration;Written Material;Video             Education Topics: Hypertension, Hypertension Reduction -Define heart disease and high blood pressure. Discus how high blood pressure affects the body and ways to reduce high blood pressure.   Exercise and Your Heart -Discuss why it is important to exercise, the FITT principles of exercise, normal and abnormal responses to exercise, and how to exercise safely.   Angina -Discuss definition of angina, causes of angina, treatment of angina, and how to decrease risk of having angina.   Cardiac Medications -Review what the following cardiac medications are used for, how they affect the body, and side effects that may occur when taking the medications.  Medications include Aspirin, Beta blockers, calcium channel blockers, ACE Inhibitors, angiotensin receptor blockers, diuretics, digoxin, and antihyperlipidemics.   Congestive Heart Failure -Discuss the definition of CHF, how to live with CHF, the signs and symptoms of CHF, and how keep track of weight and sodium intake.   Heart Disease and Intimacy -Discus the effect sexual activity has on the heart, how changes occur during intimacy as we age, and safety during sexual activity.   Smoking Cessation / COPD -Discuss different methods to quit smoking, the health benefits of quitting smoking, and the definition of COPD.   Nutrition I: Fats -Discuss the types of cholesterol, what cholesterol does to the heart, and how cholesterol levels can be controlled.   Nutrition II: Labels -Discuss the different components of food labels and how to read food label   Heart Parts/Heart Disease and PAD -Discuss the anatomy of the heart, the pathway of blood circulation through the heart, and these are affected by heart  disease.   Stress I: Signs and Symptoms -Discuss the causes of stress, how stress may lead to anxiety and depression, and ways to limit stress.   Stress II: Relaxation -Discuss different types of relaxation techniques to limit stress.   Warning Signs of Stroke / TIA -Discuss definition of a stroke, what the signs and symptoms are of a  stroke, and how to identify when someone is having stroke.   Knowledge Questionnaire Score:  Knowledge Questionnaire Score - 10/11/20 1125       Knowledge Questionnaire Score   Pre Score 19/24             Core Components/Risk Factors/Patient Goals at Admission:  Personal Goals and Risk Factors at Admission - 10/11/20 1125       Core Components/Risk Factors/Patient Goals on Admission    Weight Management Yes;Weight Maintenance    Intervention Weight Management: Develop a combined nutrition and exercise program designed to reach desired caloric intake, while maintaining appropriate intake of nutrient and fiber, sodium and fats, and appropriate energy expenditure required for the weight goal.;Weight Management: Provide education and appropriate resources to help participant work on and attain dietary goals.    Expected Outcomes Weight Maintenance: Understanding of the daily nutrition guidelines, which includes 25-35% calories from fat, 7% or less cal from saturated fats, less than 200mg  cholesterol, less than 1.5gm of sodium, & 5 or more servings of fruits and vegetables daily    Heart Failure Yes    Intervention Provide a combined exercise and nutrition program that is supplemented with education, support and counseling about heart failure. Directed toward relieving symptoms such as shortness of breath, decreased exercise tolerance, and extremity edema.    Expected Outcomes Short term: Attendance in program 2-3 days a week with increased exercise capacity. Reported lower sodium intake. Reported increased fruit and vegetable intake. Reports medication  compliance.;Short term: Daily weights obtained and reported for increase. Utilizing diuretic protocols set by physician.;Long term: Adoption of self-care skills and reduction of barriers for early signs and symptoms recognition and intervention leading to self-care maintenance.    Hypertension Yes    Intervention Provide education on lifestyle modifcations including regular physical activity/exercise, weight management, moderate sodium restriction and increased consumption of fresh fruit, vegetables, and low fat dairy, alcohol moderation, and smoking cessation.;Monitor prescription use compliance.    Expected Outcomes Short Term: Continued assessment and intervention until BP is < 140/83mm HG in hypertensive participants. < 130/15mm HG in hypertensive participants with diabetes, heart failure or chronic kidney disease.;Long Term: Maintenance of blood pressure at goal levels.    Lipids Yes    Intervention Provide education and support for participant on nutrition & aerobic/resistive exercise along with prescribed medications to achieve LDL 70mg , HDL >40mg .    Expected Outcomes Short Term: Participant states understanding of desired cholesterol values and is compliant with medications prescribed. Participant is following exercise prescription and nutrition guidelines.;Long Term: Cholesterol controlled with medications as prescribed, with individualized exercise RX and with personalized nutrition plan. Value goals: LDL < 70mg , HDL > 40 mg.    Stress Yes    Intervention Offer individual and/or small group education and counseling on adjustment to heart disease, stress management and health-related lifestyle change. Teach and support self-help strategies.;Refer participants experiencing significant psychosocial distress to appropriate mental health specialists for further evaluation and treatment. When possible, include family members and significant others in education/counseling sessions.    Expected Outcomes  Short Term: Participant demonstrates changes in health-related behavior, relaxation and other stress management skills, ability to obtain effective social support, and compliance with psychotropic medications if prescribed.;Long Term: Emotional wellbeing is indicated by absence of clinically significant psychosocial distress or social isolation.    Personal Goal Other Yes    Personal Goal Short term: wants to return to hunting and fishing. Long term: Increase stamina and strength to pick up grandkids  Intervention Will continue to monitor pt and progress workloads as tolerated without sign or symptom    Expected Outcomes Pt will achieve his goals             Core Components/Risk Factors/Patient Goals Review:   Goals and Risk Factor Review     Row Name 10/15/20 1010             Core Components/Risk Factors/Patient Goals Review   Personal Goals Review Weight Management/Obesity;Heart Failure;Stress;Hypertension;Lipids       Review Jaquan started exercise at cardiac rehab on 10/15/20 and did well with exercise, Vital signs stable       Expected Outcomes Ola will continue to participate in phase 2 cardiac rehab for exercise, nutrition and life style modifiactions.                Core Components/Risk Factors/Patient Goals at Discharge (Final Review):   Goals and Risk Factor Review - 10/15/20 1010       Core Components/Risk Factors/Patient Goals Review   Personal Goals Review Weight Management/Obesity;Heart Failure;Stress;Hypertension;Lipids    Review Taber started exercise at cardiac rehab on 10/15/20 and did well with exercise, Vital signs stable    Expected Outcomes Arek will continue to participate in phase 2 cardiac rehab for exercise, nutrition and life style modifiactions.             ITP Comments:  ITP Comments     Row Name 10/11/20 1547 10/15/20 1006         ITP Comments Dr Armanda Magic MD, Medical Director 30 Day ITP Review. Buford started  cardiac rehab on  10/15/20 and did well with exercise.               Comments: See ITP comments.Thayer Headings RN BSN

## 2020-10-16 ENCOUNTER — Ambulatory Visit (HOSPITAL_COMMUNITY): Payer: BC Managed Care – PPO

## 2020-10-17 ENCOUNTER — Encounter (HOSPITAL_COMMUNITY)
Admission: RE | Admit: 2020-10-17 | Discharge: 2020-10-17 | Disposition: A | Discharge status: Home / Self Care | Payer: BC Managed Care – PPO | Source: Ambulatory Visit | Attending: Internal Medicine | Admitting: Internal Medicine

## 2020-10-17 ENCOUNTER — Other Ambulatory Visit: Payer: Self-pay

## 2020-10-17 DIAGNOSIS — I2121 ST elevation (STEMI) myocardial infarction involving left circumflex coronary artery: Secondary | ICD-10-CM

## 2020-10-17 DIAGNOSIS — Z955 Presence of coronary angioplasty implant and graft: Secondary | ICD-10-CM

## 2020-10-19 ENCOUNTER — Other Ambulatory Visit: Payer: Self-pay

## 2020-10-19 ENCOUNTER — Encounter (HOSPITAL_COMMUNITY)
Admission: RE | Admit: 2020-10-19 | Discharge: 2020-10-19 | Disposition: A | Discharge status: Home / Self Care | Payer: BC Managed Care – PPO | Source: Ambulatory Visit | Attending: Internal Medicine | Admitting: Internal Medicine

## 2020-10-19 DIAGNOSIS — Z955 Presence of coronary angioplasty implant and graft: Secondary | ICD-10-CM

## 2020-10-19 DIAGNOSIS — I2121 ST elevation (STEMI) myocardial infarction involving left circumflex coronary artery: Secondary | ICD-10-CM

## 2020-10-19 NOTE — Progress Notes (Signed)
Jeffrey Morse 65 y.o. male Nutrition Note  Diagnosis: STEMI, S/p PCI/DES LCFx  Past Medical History:  Diagnosis Date   HTN (hypertension)    Hypercholesteremia    Hypertension    Borderline hypertension   Sleep apnea    Stroke [facility])      Medications reviewed.   Current Outpatient Medications:    acetaminophen (TYLENOL) 325 MG tablet, Take 2 tablets (650 mg total) by mouth every 4 (four) hours as needed for mild pain (or temp > 37.5 C (99.5 F))., Disp: , Rfl:    Alirocumab (PRALUENT) 75 MG/ML SOAJ, Inject 1 pen into the skin every 14 (fourteen) days., Disp: 6 mL, Rfl: 3   aspirin EC 81 MG tablet, Take 81 mg by mouth daily. Swallow whole., Disp: , Rfl:    carvedilol (COREG) 3.125 MG tablet, Take 1 tablet (3.125 mg total) by mouth 2 (two) times daily with a meal., Disp: 60 tablet, Rfl: 11   clopidogrel (PLAVIX) 75 MG tablet, Take 1 tablet (75 mg total) by mouth daily., Disp: 30 tablet, Rfl: 11   empagliflozin (JARDIANCE) 10 MG TABS tablet, Take 1 tablet (10 mg total) by mouth daily., Disp: 30 tablet, Rfl: 11   escitalopram (LEXAPRO) 20 MG tablet, Take 20 mg by mouth daily., Disp: , Rfl:    ezetimibe (ZETIA) 10 MG tablet, Take 1 tablet (10 mg total) by mouth daily., Disp: 90 tablet, Rfl: 3   Multiple Vitamin (MULTI VITAMIN) TABS, Take 1 tablet by mouth daily. Centrum silver, Disp: , Rfl:    omeprazole (PRILOSEC) 20 MG capsule, Take 1 capsule (20 mg total) by mouth daily., Disp: 30 capsule, Rfl: 0   rosuvastatin (CRESTOR) 40 MG tablet, Take 1 tablet (40 mg total) by mouth daily., Disp: 30 tablet, Rfl: 11   sacubitril-valsartan (ENTRESTO) 49-51 MG, Take 1 tablet by mouth 2 (two) times daily., Disp: 60 tablet, Rfl: 5   spironolactone (ALDACTONE) 25 MG tablet, Take 1 tablet (25 mg total) by mouth daily., Disp: 90 tablet, Rfl: 3   Ht Readings from Last 1 Encounters:  10/11/20 5' 9.75" (1.772 m)     Wt Readings from Last 3 Encounters:  10/11/20 202 lb 9.6 oz (91.9 kg)  10/11/20 202 lb  (91.6 kg)  09/13/20 202 lb (91.6 kg)     There is no height or weight on file to calculate BMI.   Social History   Tobacco Use  Smoking Status Former  Smokeless Tobacco Never  Tobacco Comments   social smoker in high School     Lab Results  Component Value Date   CHOL 170 09/13/2020   Lab Results  Component Value Date   HDL 56 09/13/2020   Lab Results  Component Value Date   LDLCALC 95 09/13/2020   Lab Results  Component Value Date   TRIG 93 09/13/2020     Lab Results  Component Value Date   HGBA1C 6.1 (H) 02/18/2020     CBG (last 3)  No results for input(s): GLUCAP in the last 72 hours.   Nutrition Note  Spoke with pt. Nutrition Plan and Nutrition Survey goals reviewed with pt. Survey shows pt with room for improvement in diet. Pt agrees. He has already reduced sweets/sugar, he avoids fast food, limits processed meats and fried foods.   Pt has Pre-diabetes. Last A1c indicates blood glucose well-controlled. We reviewed diabetes prevention.   Pt with dx of CHF. Per discussion, pt does use canned/convenience foods often. Pt does not add salt to food.  Pt does not eat out frequently.   LDL slightly elevated. We reviewed whole grains/fiber rich foods.  Pt expressed understanding of the information reviewed.    Nutrition Diagnosis Food-and nutrition-related knowledge deficit related to lack of exposure to information as related to diagnosis of: ? CVD ? Pre-diabetes  Nutrition Intervention Pt's individual nutrition plan reviewed with pt. Benefits of adopting Heart Healthy diet discussed when Picture Your Plate reviewed.  Continue client-centered nutrition education by RD, as part of interdisciplinary care.  Goal(s) Pt to build a healthy plate including vegetables, fruits, whole grains, and low-fat dairy products in a heart healthy meal plan. Pt to eat 3 servings whole grains each day  Plan:  Will provide client-centered nutrition education as part of  interdisciplinary care Monitor and evaluate progress toward nutrition goal with team.   Andrey Campanile, MS, RDN, LDN, CDCES

## 2020-10-22 ENCOUNTER — Encounter (HOSPITAL_COMMUNITY)
Admission: RE | Admit: 2020-10-22 | Discharge: 2020-10-22 | Disposition: A | Discharge status: Home / Self Care | Payer: BC Managed Care – PPO | Source: Ambulatory Visit | Attending: Internal Medicine | Admitting: Internal Medicine

## 2020-10-22 ENCOUNTER — Other Ambulatory Visit: Payer: Self-pay

## 2020-10-22 DIAGNOSIS — Z955 Presence of coronary angioplasty implant and graft: Secondary | ICD-10-CM

## 2020-10-22 DIAGNOSIS — I2121 ST elevation (STEMI) myocardial infarction involving left circumflex coronary artery: Secondary | ICD-10-CM | POA: Diagnosis not present

## 2020-10-24 ENCOUNTER — Encounter (HOSPITAL_COMMUNITY)
Admission: RE | Admit: 2020-10-24 | Discharge: 2020-10-24 | Disposition: A | Discharge status: Home / Self Care | Payer: BC Managed Care – PPO | Source: Ambulatory Visit | Attending: Internal Medicine | Admitting: Internal Medicine

## 2020-10-24 ENCOUNTER — Other Ambulatory Visit: Payer: Self-pay

## 2020-10-24 DIAGNOSIS — Z955 Presence of coronary angioplasty implant and graft: Secondary | ICD-10-CM

## 2020-10-24 DIAGNOSIS — I2121 ST elevation (STEMI) myocardial infarction involving left circumflex coronary artery: Secondary | ICD-10-CM

## 2020-10-26 ENCOUNTER — Encounter (HOSPITAL_COMMUNITY)
Admission: RE | Admit: 2020-10-26 | Discharge: 2020-10-26 | Disposition: A | Discharge status: Home / Self Care | Payer: BC Managed Care – PPO | Source: Ambulatory Visit | Attending: Internal Medicine | Admitting: Internal Medicine

## 2020-10-26 ENCOUNTER — Other Ambulatory Visit: Payer: Self-pay

## 2020-10-26 DIAGNOSIS — I2121 ST elevation (STEMI) myocardial infarction involving left circumflex coronary artery: Secondary | ICD-10-CM | POA: Diagnosis not present

## 2020-10-26 DIAGNOSIS — Z955 Presence of coronary angioplasty implant and graft: Secondary | ICD-10-CM

## 2020-10-29 ENCOUNTER — Other Ambulatory Visit: Payer: Self-pay

## 2020-10-29 ENCOUNTER — Encounter (HOSPITAL_COMMUNITY)
Admission: RE | Admit: 2020-10-29 | Discharge: 2020-10-29 | Disposition: A | Discharge status: Home / Self Care | Payer: BC Managed Care – PPO | Source: Ambulatory Visit | Attending: Internal Medicine | Admitting: Internal Medicine

## 2020-10-29 DIAGNOSIS — I2121 ST elevation (STEMI) myocardial infarction involving left circumflex coronary artery: Secondary | ICD-10-CM

## 2020-10-29 DIAGNOSIS — Z955 Presence of coronary angioplasty implant and graft: Secondary | ICD-10-CM

## 2020-10-29 NOTE — Progress Notes (Signed)
Reviewed home exercise Rx today with patient. Encouraged warm-up, cool-down, and stretching. Reviewed THRR of 63-126 and keeping RPE between 11-13. Encouraged hydration with activity. Discussed weather parameters for temperature and humidity. Reviewed S/S to terminate exercise and when to call MD vs 911. Pt encouraged to exercise 2-4 x/week in addition to the CRP2 program. Pt plans to walk at home.  Pt verbalized understanding of the home exercise Rx and was provided a copy.  Lorin Picket MS, ACSM-CEP, CCRP

## 2020-10-31 ENCOUNTER — Encounter (HOSPITAL_COMMUNITY)
Admission: RE | Admit: 2020-10-31 | Discharge: 2020-10-31 | Disposition: A | Discharge status: Home / Self Care | Payer: BC Managed Care – PPO | Source: Ambulatory Visit | Attending: Internal Medicine | Admitting: Internal Medicine

## 2020-10-31 ENCOUNTER — Other Ambulatory Visit: Payer: Self-pay

## 2020-10-31 ENCOUNTER — Other Ambulatory Visit (HOSPITAL_COMMUNITY): Payer: Self-pay | Admitting: Cardiology

## 2020-10-31 DIAGNOSIS — I2121 ST elevation (STEMI) myocardial infarction involving left circumflex coronary artery: Secondary | ICD-10-CM | POA: Diagnosis not present

## 2020-10-31 DIAGNOSIS — Z955 Presence of coronary angioplasty implant and graft: Secondary | ICD-10-CM

## 2020-11-02 ENCOUNTER — Encounter (HOSPITAL_COMMUNITY)
Admission: RE | Admit: 2020-11-02 | Discharge: 2020-11-02 | Disposition: A | Discharge status: Home / Self Care | Payer: BC Managed Care – PPO | Source: Ambulatory Visit | Attending: Internal Medicine | Admitting: Internal Medicine

## 2020-11-02 ENCOUNTER — Other Ambulatory Visit: Payer: Self-pay

## 2020-11-02 DIAGNOSIS — I2121 ST elevation (STEMI) myocardial infarction involving left circumflex coronary artery: Secondary | ICD-10-CM | POA: Diagnosis not present

## 2020-11-02 DIAGNOSIS — Z955 Presence of coronary angioplasty implant and graft: Secondary | ICD-10-CM

## 2020-11-05 ENCOUNTER — Encounter (HOSPITAL_COMMUNITY)
Admission: RE | Admit: 2020-11-05 | Discharge: 2020-11-05 | Disposition: A | Discharge status: Home / Self Care | Payer: BC Managed Care – PPO | Source: Ambulatory Visit | Attending: Internal Medicine | Admitting: Internal Medicine

## 2020-11-05 ENCOUNTER — Other Ambulatory Visit: Payer: Self-pay

## 2020-11-05 DIAGNOSIS — Z5189 Encounter for other specified aftercare: Secondary | ICD-10-CM | POA: Insufficient documentation

## 2020-11-05 DIAGNOSIS — Z7982 Long term (current) use of aspirin: Secondary | ICD-10-CM | POA: Insufficient documentation

## 2020-11-05 DIAGNOSIS — I252 Old myocardial infarction: Secondary | ICD-10-CM | POA: Diagnosis not present

## 2020-11-05 DIAGNOSIS — I2121 ST elevation (STEMI) myocardial infarction involving left circumflex coronary artery: Secondary | ICD-10-CM

## 2020-11-05 DIAGNOSIS — Z7984 Long term (current) use of oral hypoglycemic drugs: Secondary | ICD-10-CM | POA: Diagnosis not present

## 2020-11-05 DIAGNOSIS — Z955 Presence of coronary angioplasty implant and graft: Secondary | ICD-10-CM | POA: Diagnosis present

## 2020-11-05 DIAGNOSIS — Z79899 Other long term (current) drug therapy: Secondary | ICD-10-CM | POA: Diagnosis not present

## 2020-11-05 DIAGNOSIS — Z7902 Long term (current) use of antithrombotics/antiplatelets: Secondary | ICD-10-CM | POA: Insufficient documentation

## 2020-11-07 ENCOUNTER — Encounter (HOSPITAL_COMMUNITY)
Admission: RE | Admit: 2020-11-07 | Discharge: 2020-11-07 | Disposition: A | Discharge status: Home / Self Care | Payer: BC Managed Care – PPO | Source: Ambulatory Visit | Attending: Internal Medicine | Admitting: Internal Medicine

## 2020-11-07 ENCOUNTER — Other Ambulatory Visit: Payer: Self-pay

## 2020-11-07 DIAGNOSIS — Z955 Presence of coronary angioplasty implant and graft: Secondary | ICD-10-CM

## 2020-11-07 DIAGNOSIS — I2121 ST elevation (STEMI) myocardial infarction involving left circumflex coronary artery: Secondary | ICD-10-CM

## 2020-11-07 DIAGNOSIS — Z5189 Encounter for other specified aftercare: Secondary | ICD-10-CM | POA: Diagnosis not present

## 2020-11-09 ENCOUNTER — Encounter (HOSPITAL_COMMUNITY): Payer: BC Managed Care – PPO

## 2020-11-12 ENCOUNTER — Encounter (HOSPITAL_COMMUNITY)
Admission: RE | Admit: 2020-11-12 | Discharge: 2020-11-12 | Disposition: A | Discharge status: Home / Self Care | Payer: BC Managed Care – PPO | Source: Ambulatory Visit | Attending: Internal Medicine | Admitting: Internal Medicine

## 2020-11-12 ENCOUNTER — Other Ambulatory Visit: Payer: Self-pay

## 2020-11-12 DIAGNOSIS — I2121 ST elevation (STEMI) myocardial infarction involving left circumflex coronary artery: Secondary | ICD-10-CM

## 2020-11-12 DIAGNOSIS — Z955 Presence of coronary angioplasty implant and graft: Secondary | ICD-10-CM

## 2020-11-12 DIAGNOSIS — Z5189 Encounter for other specified aftercare: Secondary | ICD-10-CM | POA: Diagnosis not present

## 2020-11-13 NOTE — Progress Notes (Signed)
Cardiac Individual Treatment Plan  Patient Details  Name: Jeffrey Morse MRN: [id number] Date of Birth: 1955-05-29 Referring Provider:   Flowsheet Row CARDIAC REHAB PHASE II ORIENTATION from 10/11/2020 in [place]  Referring Provider Dr. Glori Bickers, MD       Initial Encounter Date:  [place] PHASE II ORIENTATION from 10/11/2020 in [facility]  Date 10/11/20       Visit Diagnosis: 02/15/20 STEMI  02/15/20 S/P PCI/DES LCfx  Patient's Home Medications on Admission:  Current Outpatient Medications:    acetaminophen (TYLENOL) 325 MG tablet, Take 2 tablets (650 mg total) by mouth every 4 (four) hours as needed for mild pain (or temp > 37.5 C (99.5 F))., Disp: , Rfl:    Alirocumab (PRALUENT) 75 MG/ML SOAJ, Inject 1 pen into the skin every 14 (fourteen) days., Disp: 6 mL, Rfl: 3   aspirin EC 81 MG tablet, Take 81 mg by mouth daily. Swallow whole., Disp: , Rfl:    carvedilol (COREG) 3.125 MG tablet, Take 1 tablet (3.125 mg total) by mouth 2 (two) times daily with a meal., Disp: 60 tablet, Rfl: 11   clopidogrel (PLAVIX) 75 MG tablet, Take 1 tablet (75 mg total) by mouth daily., Disp: 30 tablet, Rfl: 11   empagliflozin (JARDIANCE) 10 MG TABS tablet, Take 1 tablet (10 mg total) by mouth daily., Disp: 30 tablet, Rfl: 11   ENTRESTO 49-51 MG, TAKE 1 TABLET BY MOUTH TWICE DAILY, Disp: 180 tablet, Rfl: 0   escitalopram (LEXAPRO) 20 MG tablet, Take 20 mg by mouth daily., Disp: , Rfl:    ezetimibe (ZETIA) 10 MG tablet, Take 1 tablet (10 mg total) by mouth daily., Disp: 90 tablet, Rfl: 3   Multiple Vitamin (MULTI VITAMIN) TABS, Take 1 tablet by mouth daily. Centrum silver, Disp: , Rfl:    omeprazole (PRILOSEC) 20 MG capsule, Take 1 capsule (20 mg total) by mouth daily., Disp: 30 capsule, Rfl: 0   rosuvastatin (CRESTOR) 40 MG tablet, Take 1 tablet (40 mg total) by mouth daily., Disp: 30 tablet, Rfl: 11    spironolactone (ALDACTONE) 25 MG tablet, Take 1 tablet (25 mg total) by mouth daily., Disp: 90 tablet, Rfl: 3  Past Medical History: Past Medical History:  Diagnosis Date   HTN (hypertension)    Hypercholesteremia    Hypertension    Borderline hypertension   Sleep apnea    Stroke ([place])     Tobacco Use: Social History   Tobacco Use  Smoking Status Former  Smokeless Tobacco Never  Tobacco Comments   social smoker in high School    Labs: Recent Review Flowsheet Data     Labs for ITP Cardiac and Pulmonary Rehab Latest Ref Rng & Units 02/23/2020 02/24/2020 02/26/2020 07/16/2020 09/13/2020   Cholestrol 0 - 200 mg/dL - - - 178 170   LDLCALC 0 - 99 mg/dL - - - 104(H) 95   HDL >40 mg/dL - - - 45 56   Trlycerides <150 mg/dL - - - 143 93   Hemoglobin A1c 4.8 - 5.6 % - - - - -   PHART 7.350 - 7.450 - - 7.489(H) - -   PCO2ART 32.0 - 48.0 mmHg - - 31.7(L) - -   HCO3 20.0 - 28.0 mmol/L - - 24.2 - -   TCO2 22 - 32 mmol/L - - 25 - -   ACIDBASEDEF 0.0 - 2.0 mmol/L - - - - -   O2SAT % 71.7  66.3 94.0 - -       Capillary Blood Glucose: Lab Results  Component Value Date   GLUCAP 114 (H) 04/04/2020   GLUCAP 102 (H) 03/09/2020   GLUCAP 128 (H) 03/08/2020   GLUCAP 155 (H) 03/08/2020   GLUCAP 156 (H) 03/08/2020     Exercise Target Goals: Exercise Program Goal: Individual exercise prescription set using results from initial 6 min walk test and THRR while considering  patient's activity barriers and safety.   Exercise Prescription Goal: Initial exercise prescription builds to 30-45 minutes a day of aerobic activity, 2-3 days per week.  Home exercise guidelines will be given to patient during program as part of exercise prescription that the participant will acknowledge.  Activity Barriers & Risk Stratification:  Activity Barriers & Cardiac Risk Stratification - 10/11/20 1115       Activity Barriers & Cardiac Risk Stratification   Activity Barriers Back Problems    Cardiac Risk  Stratification High             6 Minute Walk:  6 Minute Walk     Row Name 10/11/20 1112         6 Minute Walk   Phase Initial     Distance 1865 feet     Walk Time 6 minutes     # of Rest Breaks 0     MPH 3.53     METS 3.98     RPE 11     Perceived Dyspnea  0     VO2 Peak 13.93     Symptoms No     Resting HR 72 bpm     Resting BP 98/60     Resting Oxygen Saturation  97 %     Exercise Oxygen Saturation  during 6 min walk 97 %     Max Ex. HR 89 bpm     Max Ex. BP 122/70     2 Minute Post BP 102/70              Oxygen Initial Assessment:   Oxygen Re-Evaluation:   Oxygen Discharge (Final Oxygen Re-Evaluation):   Initial Exercise Prescription:  Initial Exercise Prescription - 10/11/20 1100       Date of Initial Exercise RX and Referring Provider   Date 10/11/20    Referring Provider Dr. Glori Bickers, MD    Expected Discharge Date 12/07/20      NuStep   Level 3    SPM 80    Minutes 15    METs 2.5      Arm Ergometer   Level 2    RPM 60    Minutes 15      Prescription Details   Frequency (times per week) 3    Duration Progress to 30 minutes of continuous aerobic without signs/symptoms of physical distress      Intensity   THRR 40-80% of Max Heartrate 63-126    Ratings of Perceived Exertion 11-13    Perceived Dyspnea 0-4      Progression   Progression Continue progressive overload as per policy without signs/symptoms or physical distress.      Resistance Training   Training Prescription Yes    Weight 4    Reps 10-15             Perform Capillary Blood Glucose checks as needed.  Exercise Prescription Changes:   Exercise Prescription Changes     Row Name 10/15/20 1000 10/26/20 1000 11/12/20 1000  Response to Exercise   Blood Pressure (Admit) 108/62 116/78 104/62     Blood Pressure (Exercise) 124/74 132/78 124/72     Blood Pressure (Exit) 104/70 104/70 98/66     Heart Rate (Admit) 81 bpm 74 bpm 96 bpm     Heart  Rate (Exercise) 79 bpm 77 bpm 114 bpm     Heart Rate (Exit) 75 bpm 73 bpm 87 bpm     Rating of Perceived Exertion (Exercise) 11 11 13      Symptoms None None None     Comments Pt's first day in the CRP2 program Reviewed METs and home exercise with patient Reviewed METs and Goals     Duration Continue with 30 min of aerobic exercise without signs/symptoms of physical distress. Continue with 30 min of aerobic exercise without signs/symptoms of physical distress. Continue with 30 min of aerobic exercise without signs/symptoms of physical distress.     Intensity THRR unchanged THRR unchanged THRR unchanged           Progression   Progression Continue to progress workloads to maintain intensity without signs/symptoms of physical distress. Continue to progress workloads to maintain intensity without signs/symptoms of physical distress. Continue to progress workloads to maintain intensity without signs/symptoms of physical distress.     Average METs 2.2 2.5 3.1           Resistance Training   Training Prescription Yes Yes Yes     Weight 4 lbs 5 lbs 5 lbs     Reps 10-15 10-15 10-15     Time 10 Minutes 10 Minutes 10 Minutes           Interval Training   Interval Training No No No           NuStep   Level 3 3 4      SPM 80 85 95     Minutes 15 15 15      METs 2.2 2.5 4.3           Arm Ergometer   Level 2 3 3      Watts 4 10 14      RPM 60 60 60     Minutes 15 15 15      METs -- -- 1.8           Home Exercise Plan   Plans to continue exercise at -- Home (comment) Home (comment)     Frequency -- Add 2 additional days to program exercise sessions. Add 2 additional days to program exercise sessions.     Initial Home Exercises Provided -- 10/26/20 10/26/20              Exercise Comments:   Exercise Comments     Row Name 10/15/20 1034 10/26/20 1042 11/12/20 1000       Exercise Comments Pt's first day in the CRP2 program. Pt tolerated the session well with no complaints. Reviewed  home exercise Rx and METs with patient. Pt verbalized understnading of the home exercise Rx and was provided a copy. Reviewed METs and goals. Pt is making good progress in the CRP2 program, Walking at home with wife in addtion to the CR program.              Exercise Goals and Review:   Exercise Goals     Row Name 10/11/20 1122             Exercise Goals   Increase Physical Activity Yes       Intervention Provide advice,  education, support and counseling about physical activity/exercise needs.;Develop an individualized exercise prescription for aerobic and resistive training based on initial evaluation findings, risk stratification, comorbidities and participant's personal goals.       Expected Outcomes Short Term: Attend rehab on a regular basis to increase amount of physical activity.;Long Term: Add in home exercise to make exercise part of routine and to increase amount of physical activity.;Long Term: Exercising regularly at least 3-5 days a week.       Increase Strength and Stamina Yes       Intervention Provide advice, education, support and counseling about physical activity/exercise needs.;Develop an individualized exercise prescription for aerobic and resistive training based on initial evaluation findings, risk stratification, comorbidities and participant's personal goals.       Expected Outcomes Short Term: Increase workloads from initial exercise prescription for resistance, speed, and METs.;Short Term: Perform resistance training exercises routinely during rehab and add in resistance training at home;Long Term: Improve cardiorespiratory fitness, muscular endurance and strength as measured by increased METs and functional capacity (6MWT)       Able to understand and use rate of perceived exertion (RPE) scale Yes       Intervention Provide education and explanation on how to use RPE scale       Expected Outcomes Short Term: Able to use RPE daily in rehab to express subjective  intensity level;Long Term:  Able to use RPE to guide intensity level when exercising independently       Knowledge and understanding of Target Heart Rate Range (THRR) Yes       Intervention Provide education and explanation of THRR including how the numbers were predicted and where they are located for reference       Expected Outcomes Short Term: Able to state/look up THRR;Long Term: Able to use THRR to govern intensity when exercising independently;Short Term: Able to use daily as guideline for intensity in rehab       Understanding of Exercise Prescription Yes       Intervention Provide education, explanation, and written materials on patient's individual exercise prescription       Expected Outcomes Short Term: Able to explain program exercise prescription;Long Term: Able to explain home exercise prescription to exercise independently                Exercise Goals Re-Evaluation :  Exercise Goals Re-Evaluation     [place] Name 10/15/20 1032 10/26/20 1026 11/12/20 1000         Exercise Goal Re-Evaluation   Exercise Goals Review Increase Physical Activity;Increase Strength and Stamina;Able to understand and use rate of perceived exertion (RPE) scale;Knowledge and understanding of Target Heart Rate Range (THRR);Able to check pulse independently;Understanding of Exercise Prescription Increase Physical Activity;Increase Strength and Stamina;Able to understand and use rate of perceived exertion (RPE) scale;Knowledge and understanding of Target Heart Rate Range (THRR);Understanding of Exercise Prescription;Able to check pulse independently Increase Physical Activity;Increase Strength and Stamina;Able to understand and use rate of perceived exertion (RPE) scale;Knowledge and understanding of Target Heart Rate Range (THRR);Able to check pulse independently;Understanding of Exercise Prescription     Comments Pt's first day in the CRP2 program. Pt understands the exercise Rx, THRR, and RPE scale. Reviewed  home exercise and METs. Pt will walk at home 2-3x/week for 30 minutes. Reviewed METs and goals. Pt voices feeling stronger. A patient goal was to hold his grandchildren and he has been able to do more of that and feeling good about it. His stamina is increased  and is making good progress on his exercise MET level.     Expected Outcomes Will continue to monitor patient and progress exercise workloads as tolerated. Pt will begin to walk at home in addtion to the [place] program Will continue to monitor and progress exercise workloads as tolerated.              Discharge Exercise Prescription (Final Exercise Prescription Changes):  Exercise Prescription Changes - 11/12/20 1000       Response to Exercise   Blood Pressure (Admit) 104/62    Blood Pressure (Exercise) 124/72    Blood Pressure (Exit) 98/66    Heart Rate (Admit) 96 bpm    Heart Rate (Exercise) 114 bpm    Heart Rate (Exit) 87 bpm    Rating of Perceived Exertion (Exercise) 13    Symptoms None    Comments Reviewed METs and Goals    Duration Continue with 30 min of aerobic exercise without signs/symptoms of physical distress.    Intensity THRR unchanged      Progression   Progression Continue to progress workloads to maintain intensity without signs/symptoms of physical distress.    Average METs 3.1      Resistance Training   Training Prescription Yes    Weight 5 lbs    Reps 10-15    Time 10 Minutes      Interval Training   Interval Training No      NuStep   Level 4    SPM 95    Minutes 15    METs 4.3      Arm Ergometer   Level 3    Watts 14    RPM 60    Minutes 15    METs 1.8      Home Exercise Plan   Plans to continue exercise at Home (comment)    Frequency Add 2 additional days to program exercise sessions.    Initial Home Exercises Provided 10/26/20             Nutrition:  Target Goals: Understanding of nutrition guidelines, daily intake of sodium <1519m, cholesterol <2039m calories 30% from fat  and 7% or less from saturated fats, daily to have 5 or more servings of fruits and vegetables.  Biometrics:  Pre Biometrics - 10/11/20 1109       Pre Biometrics   Waist Circumference 42 inches    Hip Circumference 43 inches    Waist to Hip Ratio 0.98 %    Triceps Skinfold 22 mm    % Body Fat 30.3 %    Grip Strength 35 kg    Flexibility 12.75 in    Single Leg Stand 28.8 seconds              Nutrition Therapy Plan and Nutrition Goals:  Nutrition Therapy & Goals - 10/19/20 1018       Nutrition Therapy   Diet TLC    Drug/Food Interactions Statins/Certain Fruits      Personal Nutrition Goals   Nutrition Goal Pt to build a healthy plate including vegetables, fruits, whole grains, and low-fat dairy products in a heart healthy meal plan.    Personal Goal #2 Pt to eat 3 servings whole grains each day      Intervention Plan   Intervention Nutrition handout(s) given to patient.;Prescribe, educate and counsel regarding individualized specific dietary modifications aiming towards targeted core components such as weight, hypertension, lipid management, diabetes, heart failure and other comorbidities.    Expected Outcomes Short Term  Goal: A plan has been developed with personal nutrition goals set during dietitian appointment.;Long Term Goal: Adherence to prescribed nutrition plan.             Nutrition Assessments:  MEDIFICTS Score Key: ?70 Need to make dietary changes  40-70 Heart Healthy Diet ? 40 Therapeutic Level Cholesterol Diet   Flowsheet Row CARDIAC REHAB PHASE II EXERCISE from 10/17/2020 in [place]  Picture Your Plate Total Score on Admission 56      Picture Your Plate Scores: <79 Unhealthy dietary pattern with much room for improvement. 41-50 Dietary pattern unlikely to meet recommendations for good health and room for improvement. 51-60 More healthful dietary pattern, with some room for improvement.  >60 Healthy dietary  pattern, although there may be some specific behaviors that could be improved.    Nutrition Goals Re-Evaluation:  Nutrition Goals Re-Evaluation     [place] Name 10/19/20 1018             Goals   Current Weight 202 lb (91.6 kg)                Nutrition Goals Re-Evaluation:  Nutrition Goals Re-Evaluation     [place] Name 10/19/20 1018             Goals   Current Weight 202 lb (91.6 kg)                Nutrition Goals Discharge (Final Nutrition Goals Re-Evaluation):  Nutrition Goals Re-Evaluation - 10/19/20 1018       Goals   Current Weight 202 lb (91.6 kg)             Psychosocial: Target Goals: Acknowledge presence or absence of significant depression and/or stress, maximize coping skills, provide positive support system. Participant is able to verbalize types and ability to use techniques and skills needed for reducing stress and depression.  Initial Review & Psychosocial Screening:  Initial Psych Review & Screening - 10/11/20 1548       Initial Review   Current issues with Current Stress Concerns    Source of Stress Concerns Financial;Unable to perform yard/household activities    Comments Treyden denies being depressed. Perris says he will not be able to return to his previous job. Helmut admits to [employer] some stress regarding retirement/ disabilty.      Family Dynamics   Good Support System? Yes   Waldron has his wife for support     Barriers   Psychosocial barriers to participate in program The patient should benefit from training in stress management and relaxation.      Screening Interventions   Interventions Encouraged to exercise    Expected Outcomes Long Term Goal: Stressors or current issues are controlled or eliminated.             Quality of Life Scores:  Quality of Life - 10/11/20 1123       Quality of Life   Select Quality of Life      Quality of Life Scores   Health/Function Pre 26.8 %    Socioeconomic Pre 20.25 %     Psych/Spiritual Pre 27.43 %    Family Pre 30 %    GLOBAL Pre 25.89 %            Scores of 19 and below usually indicate a poorer quality of life in these areas.  A difference of  2-3 points is a clinically meaningful difference.  A difference of 2-3 points in  the total score of the Quality of Life Index has been associated with significant improvement in overall quality of life, self-image, physical symptoms, and general health in studies assessing change in quality of life.  PHQ-9: Recent Review Flowsheet Data     Depression screen [facility] 2/9 10/11/2020 04/24/2020 03/20/2020   Decreased Interest 0 0 0   Down, Depressed, Hopeless 0 0 0   PHQ - 2 Score 0 0 0   Altered sleeping - - 0   Tired, decreased energy - - 3   Change in appetite - - 0   Feeling bad or failure about yourself  - - 0   Trouble concentrating - - 0   Moving slowly or fidgety/restless - - 0   PHQ-9 Score - - 3      Interpretation of Total Score  Total Score Depression Severity:  1-4 = Minimal depression, 5-9 = Mild depression, 10-14 = Moderate depression, 15-19 = Moderately severe depression, 20-27 = Severe depression   Psychosocial Evaluation and Intervention:   Psychosocial Re-Evaluation:  Psychosocial Re-Evaluation     [place] Name 11/07/20 1233             Psychosocial Re-Evaluation   Current issues with Current Stress Concerns       Comments Myrtle has not voiced any increased stressors or concerns since participating in phase 2 cardiac rehab.       Expected Outcomes Nicholous will have decreased on controlled stresssors upon completion of phase 2 cardiac rehab.       Interventions Stress management education;Encouraged to attend Cardiac Rehabilitation for the exercise       Continue Psychosocial Services  No Follow up required               Initial Review   Source of Stress Concerns Chronic Illness;Unable to perform yard/household activities;Retirement/disability;Occupation       Comments Will continue to  monitor and offer support as needed                Psychosocial Discharge (Final Psychosocial Re-Evaluation):  Psychosocial Re-Evaluation - 11/07/20 1233       Psychosocial Re-Evaluation   Current issues with Current Stress Concerns    Comments Akeem has not voiced any increased stressors or concerns since participating in phase 2 cardiac rehab.    Expected Outcomes Benji will have decreased on controlled stresssors upon completion of phase 2 cardiac rehab.    Interventions Stress management education;Encouraged to attend Cardiac Rehabilitation for the exercise    Continue Psychosocial Services  No Follow up required      Initial Review   Source of Stress Concerns Chronic Illness;Unable to perform yard/household activities;Retirement/disability;Occupation    Comments Will continue to monitor and offer support as needed             Vocational Rehabilitation: Provide vocational rehab assistance to qualifying candidates.   Vocational Rehab Evaluation & Intervention:  Vocational Rehab - 10/11/20 1551       Initial Vocational Rehab Evaluation & Intervention   Assessment shows need for Vocational Rehabilitation No   Shaw is not working currently and is in the process of applying for disability            Education: Education Goals: Education classes will be provided on a weekly basis, covering required topics. Participant will state understanding/return demonstration of topics presented.  Learning Barriers/Preferences:  Learning Barriers/Preferences - 10/11/20 1124       Learning Barriers/Preferences   Learning Barriers Hearing;Sight  Learning Preferences Group Instruction;Individual Instruction;Pictoral;Skilled Demonstration;Written Material;Video             Education Topics: Count Your Pulse:  -Group instruction provided by verbal instruction, demonstration, patient participation and written materials to support subject.  Instructors address importance of  being able to find your pulse and how to count your pulse when at home without a heart monitor.  Patients get hands on experience counting their pulse with staff help and individually.   Heart Attack, Angina, and Risk Factor Modification:  -Group instruction provided by verbal instruction, video, and written materials to support subject.  Instructors address signs and symptoms of angina and heart attacks.    Also discuss risk factors for heart disease and how to make changes to improve heart health risk factors.   Functional Fitness:  -Group instruction provided by verbal instruction, demonstration, patient participation, and written materials to support subject.  Instructors address safety measures for doing things around the house.  Discuss how to get up and down off the floor, how to pick things up properly, how to safely get out of a chair without assistance, and balance training.   Meditation and Mindfulness:  -Group instruction provided by verbal instruction, patient participation, and written materials to support subject.  Instructor addresses importance of mindfulness and meditation practice to help reduce stress and improve awareness.  Instructor also leads participants through a meditation exercise.    Stretching for Flexibility and Mobility:  -Group instruction provided by verbal instruction, patient participation, and written materials to support subject.  Instructors lead participants through series of stretches that are designed to increase flexibility thus improving mobility.  These stretches are additional exercise for major muscle groups that are typically performed during regular warm up and cool down.   Hands Only CPR:  -Group verbal, video, and participation provides a basic overview of AHA guidelines for community CPR. Role-play of emergencies allow participants the opportunity to practice calling for help and chest compression technique with discussion of AED  use.   Hypertension: -Group verbal and written instruction that provides a basic overview of hypertension including the most recent diagnostic guidelines, risk factor reduction with self-care instructions and medication management.    Nutrition I class: Heart Healthy Eating:  -Group instruction provided by PowerPoint slides, verbal discussion, and written materials to support subject matter. The instructor gives an explanation and review of the Therapeutic Lifestyle Changes diet recommendations, which includes a discussion on lipid goals, dietary fat, sodium, fiber, plant stanol/sterol esters, sugar, and the components of a well-balanced, healthy diet.   Nutrition II class: Lifestyle Skills:  -Group instruction provided by PowerPoint slides, verbal discussion, and written materials to support subject matter. The instructor gives an explanation and review of label reading, grocery shopping for heart health, heart healthy recipe modifications, and ways to make healthier choices when eating out.   Diabetes Question & Answer:  -Group instruction provided by PowerPoint slides, verbal discussion, and written materials to support subject matter. The instructor gives an explanation and review of diabetes co-morbidities, pre- and post-prandial blood glucose goals, pre-exercise blood glucose goals, signs, symptoms, and treatment of hypoglycemia and hyperglycemia, and foot care basics.   Diabetes Blitz:  -Group instruction provided by PowerPoint slides, verbal discussion, and written materials to support subject matter. The instructor gives an explanation and review of the physiology behind type 1 and type 2 diabetes, diabetes medications and rational behind using different medications, pre- and post-prandial blood glucose recommendations and Hemoglobin A1c goals, diabetes diet, and exercise  including blood glucose guidelines for exercising safely.    Portion Distortion:  -Group instruction provided by  PowerPoint slides, verbal discussion, written materials, and food models to support subject matter. The instructor gives an explanation of serving size versus portion size, changes in portions sizes over the last 20 years, and what consists of a serving from each food group.   Stress Management:  -Group instruction provided by verbal instruction, video, and written materials to support subject matter.  Instructors review role of stress in heart disease and how to cope with stress positively.     Exercising on Your Own:  -Group instruction provided by verbal instruction, power point, and written materials to support subject.  Instructors discuss benefits of exercise, components of exercise, frequency and intensity of exercise, and end points for exercise.  Also discuss use of nitroglycerin and activating EMS.  Review options of places to exercise outside of rehab.  Review guidelines for sex with heart disease.   Cardiac Drugs I:  -Group instruction provided by verbal instruction and written materials to support subject.  Instructor reviews cardiac drug classes: antiplatelets, anticoagulants, beta blockers, and statins.  Instructor discusses reasons, side effects, and lifestyle considerations for each drug class.   Cardiac Drugs II:  -Group instruction provided by verbal instruction and written materials to support subject.  Instructor reviews cardiac drug classes: angiotensin converting enzyme inhibitors (ACE-I), angiotensin II receptor blockers (ARBs), nitrates, and calcium channel blockers.  Instructor discusses reasons, side effects, and lifestyle considerations for each drug class.   Anatomy and Physiology of the Circulatory System:  Group verbal and written instruction and models provide basic cardiac anatomy and physiology, with the coronary electrical and arterial systems. Review of: AMI, Angina, Valve disease, Heart Failure, Peripheral Artery Disease, Cardiac Arrhythmia, Pacemakers, and  the ICD.   Other Education:  -Group or individual verbal, written, or video instructions that support the educational goals of the cardiac rehab program.   Holiday Eating Survival Tips:  -Group instruction provided by PowerPoint slides, verbal discussion, and written materials to support subject matter. The instructor gives patients tips, tricks, and techniques to help them not only survive but enjoy the holidays despite the onslaught of food that accompanies the holidays.   Knowledge Questionnaire Score:  Knowledge Questionnaire Score - 10/11/20 1125       Knowledge Questionnaire Score   Pre Score 19/24             Core Components/Risk Factors/Patient Goals at Admission:  Personal Goals and Risk Factors at Admission - 10/11/20 1125       Core Components/Risk Factors/Patient Goals on Admission    Weight Management Yes;Weight Maintenance    Intervention Weight Management: Develop a combined nutrition and exercise program designed to reach desired caloric intake, while maintaining appropriate intake of nutrient and fiber, sodium and fats, and appropriate energy expenditure required for the weight goal.;Weight Management: Provide education and appropriate resources to help participant work on and attain dietary goals.    Expected Outcomes Weight Maintenance: Understanding of the daily nutrition guidelines, which includes 25-35% calories from fat, 7% or less cal from saturated fats, less than 223m cholesterol, less than 1.5gm of sodium, & 5 or more servings of fruits and vegetables daily    Heart Failure Yes    Intervention Provide a combined exercise and nutrition program that is supplemented with education, support and counseling about heart failure. Directed toward relieving symptoms such as shortness of breath, decreased exercise tolerance, and extremity edema.    Expected  Outcomes Short term: Attendance in program 2-3 days a week with increased exercise capacity. Reported lower  sodium intake. Reported increased fruit and vegetable intake. Reports medication compliance.;Short term: Daily weights obtained and reported for increase. Utilizing diuretic protocols set by physician.;Long term: Adoption of self-care skills and reduction of barriers for early signs and symptoms recognition and intervention leading to self-care maintenance.    Hypertension Yes    Intervention Provide education on lifestyle modifcations including regular physical activity/exercise, weight management, moderate sodium restriction and increased consumption of fresh fruit, vegetables, and low fat dairy, alcohol moderation, and smoking cessation.;Monitor prescription use compliance.    Expected Outcomes Short Term: Continued assessment and intervention until BP is < 140/46m HG in hypertensive participants. < 130/876mHG in hypertensive participants with diabetes, heart failure or chronic kidney disease.;Long Term: Maintenance of blood pressure at goal levels.    Lipids Yes    Intervention Provide education and support for participant on nutrition & aerobic/resistive exercise along with prescribed medications to achieve LDL <7073mHDL >70m88m  Expected Outcomes Short Term: Participant states understanding of desired cholesterol values and is compliant with medications prescribed. Participant is following exercise prescription and nutrition guidelines.;Long Term: Cholesterol controlled with medications as prescribed, with individualized exercise RX and with personalized nutrition plan. Value goals: LDL < 70mg53mL > 40 mg.    Stress Yes    Intervention Offer individual and/or small group education and counseling on adjustment to heart disease, stress management and health-related lifestyle change. Teach and support self-help strategies.;Refer participants experiencing significant psychosocial distress to appropriate mental health specialists for further evaluation and treatment. When possible, include family  members and significant others in education/counseling sessions.    Expected Outcomes Short Term: Participant demonstrates changes in health-related behavior, relaxation and other stress management skills, ability to obtain effective social support, and compliance with psychotropic medications if prescribed.;Long Term: Emotional wellbeing is indicated by absence of clinically significant psychosocial distress or social isolation.    Personal Goal Other Yes    Personal Goal Short term: wants to return to hunting and fishing. Long term: Increase stamina and strength to pick up grandkids    Intervention Will continue to monitor pt and progress workloads as tolerated without sign or symptom    Expected Outcomes Pt will achieve his goals             Core Components/Risk Factors/Patient Goals Review:   Goals and Risk Factor Review     Row Name 10/15/20 1010 11/07/20 1235           Core Components/Risk Factors/Patient Goals Review   Personal Goals Review Weight Management/Obesity;Heart Failure;Stress;Hypertension;Lipids Weight Management/Obesity;Heart Failure;Stress;Hypertension;Lipids      Review Brentley started exercise at cardiac rehab on 10/15/20 and did well with exercise, Vital signs stable Jahmil Jamarcusbeen doing well with exercise at phase 2 cardiac rehab, Vital signs have been stable      Expected Outcomes Bowman Corde continue to participate in phase 2 cardiac rehab for exercise, nutrition and life style modifiactions. Kavari will continue to participate in phase 2 cardiac rehab for exercise, nutrition and life style modifiactions.               Core Components/Risk Factors/Patient Goals at Discharge (Final Review):   Goals and Risk Factor Review - 11/07/20 1235       Core Components/Risk Factors/Patient Goals Review   Personal Goals Review Weight Management/Obesity;Heart Failure;Stress;Hypertension;Lipids    Review Rowland Bradeybeen doing well with exercise at phase  2 cardiac rehab, Vital  signs have been stable    Expected Outcomes Martice will continue to participate in phase 2 cardiac rehab for exercise, nutrition and life style modifiactions.             ITP Comments:  ITP Comments     Row Name 10/11/20 1547 10/15/20 1006 11/07/20 1232       ITP Comments Dr Fransico Him MD, Medical Director 30 Day ITP Review. Valdis started  cardiac rehab on 10/15/20 and did well with exercise. 30 Day ITP Review. Cayden has good attendance and partcipation in phase 2 cardiac rehab.              Comments: See ITP Comments

## 2020-11-14 ENCOUNTER — Other Ambulatory Visit: Payer: Self-pay

## 2020-11-14 ENCOUNTER — Other Ambulatory Visit (HOSPITAL_COMMUNITY): Payer: Self-pay

## 2020-11-14 ENCOUNTER — Encounter (HOSPITAL_COMMUNITY)
Admission: RE | Admit: 2020-11-14 | Discharge: 2020-11-14 | Disposition: A | Discharge status: Home / Self Care | Payer: BC Managed Care – PPO | Source: Ambulatory Visit | Attending: Internal Medicine | Admitting: Internal Medicine

## 2020-11-14 DIAGNOSIS — Z955 Presence of coronary angioplasty implant and graft: Secondary | ICD-10-CM

## 2020-11-14 DIAGNOSIS — I2121 ST elevation (STEMI) myocardial infarction involving left circumflex coronary artery: Secondary | ICD-10-CM

## 2020-11-14 DIAGNOSIS — Z5189 Encounter for other specified aftercare: Secondary | ICD-10-CM | POA: Diagnosis not present

## 2020-11-16 ENCOUNTER — Other Ambulatory Visit: Payer: Self-pay

## 2020-11-16 ENCOUNTER — Encounter (HOSPITAL_COMMUNITY)
Admission: RE | Admit: 2020-11-16 | Discharge: 2020-11-16 | Disposition: A | Discharge status: Home / Self Care | Payer: BC Managed Care – PPO | Source: Ambulatory Visit | Attending: Internal Medicine | Admitting: Internal Medicine

## 2020-11-16 DIAGNOSIS — Z955 Presence of coronary angioplasty implant and graft: Secondary | ICD-10-CM

## 2020-11-16 DIAGNOSIS — I2121 ST elevation (STEMI) myocardial infarction involving left circumflex coronary artery: Secondary | ICD-10-CM

## 2020-11-16 DIAGNOSIS — Z5189 Encounter for other specified aftercare: Secondary | ICD-10-CM | POA: Diagnosis not present

## 2020-11-19 ENCOUNTER — Other Ambulatory Visit: Payer: Self-pay

## 2020-11-19 ENCOUNTER — Encounter (HOSPITAL_COMMUNITY)
Admission: RE | Admit: 2020-11-19 | Discharge: 2020-11-19 | Disposition: A | Discharge status: Home / Self Care | Payer: BC Managed Care – PPO | Source: Ambulatory Visit | Attending: Internal Medicine | Admitting: Internal Medicine

## 2020-11-19 DIAGNOSIS — Z955 Presence of coronary angioplasty implant and graft: Secondary | ICD-10-CM

## 2020-11-19 DIAGNOSIS — Z5189 Encounter for other specified aftercare: Secondary | ICD-10-CM | POA: Diagnosis not present

## 2020-11-19 DIAGNOSIS — I2121 ST elevation (STEMI) myocardial infarction involving left circumflex coronary artery: Secondary | ICD-10-CM

## 2020-11-19 NOTE — Progress Notes (Signed)
Increased PVC's noted this morning on the nustep at cardiac rehab this was non sustained. Patient asymptomatic but did mention that he is taking prednisone for his wrist. Not as many PVC's were noted for the remainder of exercise. Left message about ectopy with the heart failure clinic. Will fax exercise flow sheets to Dr. Prescott Gum office for review with today's ECG tracings.Will continue to monitor the patient throughout  the program. [name], RN,BSN 11/19/2020 10:16 AM

## 2020-11-21 ENCOUNTER — Telehealth (HOSPITAL_COMMUNITY): Payer: Self-pay | Admitting: *Deleted

## 2020-11-21 ENCOUNTER — Other Ambulatory Visit: Payer: Self-pay

## 2020-11-21 ENCOUNTER — Encounter (HOSPITAL_COMMUNITY)
Admission: RE | Admit: 2020-11-21 | Discharge: 2020-11-21 | Disposition: A | Discharge status: Home / Self Care | Payer: BC Managed Care – PPO | Source: Ambulatory Visit | Attending: Internal Medicine | Admitting: Internal Medicine

## 2020-11-21 DIAGNOSIS — Z5189 Encounter for other specified aftercare: Secondary | ICD-10-CM | POA: Diagnosis not present

## 2020-11-21 DIAGNOSIS — Z955 Presence of coronary angioplasty implant and graft: Secondary | ICD-10-CM

## 2020-11-21 DIAGNOSIS — I2121 ST elevation (STEMI) myocardial infarction involving left circumflex coronary artery: Secondary | ICD-10-CM

## 2020-11-21 MED ORDER — NITROGLYCERIN 0.4 MG SL SUBL: 0.4000 mg | SUBLINGUAL_TABLET | SUBLINGUAL | 3 refills | Status: DC | PRN

## 2020-11-21 NOTE — Telephone Encounter (Signed)
Received fax from cardiac rehab, pt does not have rx for ntg and not sure if pt needed it or not  Per Dr Gala Romney, ok to give rx for sl ntg prn  Called and discussed w/pt, he is agreeable, we discussed when and how to use medication and when to call MD office/9-1-1, pt verbalized understanding, rx sent in

## 2020-11-23 ENCOUNTER — Other Ambulatory Visit: Payer: Self-pay

## 2020-11-23 ENCOUNTER — Encounter (HOSPITAL_COMMUNITY)
Admission: RE | Admit: 2020-11-23 | Discharge: 2020-11-23 | Disposition: A | Discharge status: Home / Self Care | Payer: BC Managed Care – PPO | Source: Ambulatory Visit | Attending: Internal Medicine | Admitting: Internal Medicine

## 2020-11-23 DIAGNOSIS — I2121 ST elevation (STEMI) myocardial infarction involving left circumflex coronary artery: Secondary | ICD-10-CM

## 2020-11-23 DIAGNOSIS — Z955 Presence of coronary angioplasty implant and graft: Secondary | ICD-10-CM

## 2020-11-23 DIAGNOSIS — Z5189 Encounter for other specified aftercare: Secondary | ICD-10-CM | POA: Diagnosis not present

## 2020-11-26 ENCOUNTER — Other Ambulatory Visit: Payer: Self-pay

## 2020-11-26 ENCOUNTER — Encounter (HOSPITAL_COMMUNITY)
Admission: RE | Admit: 2020-11-26 | Discharge: 2020-11-26 | Disposition: A | Discharge status: Home / Self Care | Payer: BC Managed Care – PPO | Source: Ambulatory Visit | Attending: Internal Medicine | Admitting: Internal Medicine

## 2020-11-26 DIAGNOSIS — I2121 ST elevation (STEMI) myocardial infarction involving left circumflex coronary artery: Secondary | ICD-10-CM

## 2020-11-26 DIAGNOSIS — Z955 Presence of coronary angioplasty implant and graft: Secondary | ICD-10-CM

## 2020-11-26 DIAGNOSIS — Z5189 Encounter for other specified aftercare: Secondary | ICD-10-CM | POA: Diagnosis not present

## 2020-11-28 ENCOUNTER — Other Ambulatory Visit: Payer: Self-pay

## 2020-11-28 ENCOUNTER — Encounter (HOSPITAL_COMMUNITY)
Admission: RE | Admit: 2020-11-28 | Discharge: 2020-11-28 | Disposition: A | Discharge status: Home / Self Care | Payer: BC Managed Care – PPO | Source: Ambulatory Visit | Attending: Internal Medicine | Admitting: Internal Medicine

## 2020-11-28 DIAGNOSIS — Z955 Presence of coronary angioplasty implant and graft: Secondary | ICD-10-CM

## 2020-11-28 DIAGNOSIS — Z5189 Encounter for other specified aftercare: Secondary | ICD-10-CM | POA: Diagnosis not present

## 2020-11-28 DIAGNOSIS — I2121 ST elevation (STEMI) myocardial infarction involving left circumflex coronary artery: Secondary | ICD-10-CM

## 2020-11-30 ENCOUNTER — Encounter (HOSPITAL_COMMUNITY)
Admission: RE | Admit: 2020-11-30 | Discharge: 2020-11-30 | Disposition: A | Discharge status: Home / Self Care | Payer: BC Managed Care – PPO | Source: Ambulatory Visit | Attending: Internal Medicine | Admitting: Internal Medicine

## 2020-11-30 ENCOUNTER — Other Ambulatory Visit: Payer: Self-pay

## 2020-11-30 DIAGNOSIS — Z955 Presence of coronary angioplasty implant and graft: Secondary | ICD-10-CM

## 2020-11-30 DIAGNOSIS — Z5189 Encounter for other specified aftercare: Secondary | ICD-10-CM | POA: Diagnosis not present

## 2020-11-30 DIAGNOSIS — I2121 ST elevation (STEMI) myocardial infarction involving left circumflex coronary artery: Secondary | ICD-10-CM

## 2020-12-03 ENCOUNTER — Encounter (HOSPITAL_COMMUNITY)
Admission: RE | Admit: 2020-12-03 | Discharge: 2020-12-03 | Disposition: A | Discharge status: Home / Self Care | Payer: BC Managed Care – PPO | Source: Ambulatory Visit | Attending: Internal Medicine | Admitting: Internal Medicine

## 2020-12-03 ENCOUNTER — Other Ambulatory Visit: Payer: Self-pay

## 2020-12-03 DIAGNOSIS — I2121 ST elevation (STEMI) myocardial infarction involving left circumflex coronary artery: Secondary | ICD-10-CM

## 2020-12-03 DIAGNOSIS — Z5189 Encounter for other specified aftercare: Secondary | ICD-10-CM | POA: Diagnosis not present

## 2020-12-03 DIAGNOSIS — Z955 Presence of coronary angioplasty implant and graft: Secondary | ICD-10-CM

## 2020-12-05 ENCOUNTER — Encounter (HOSPITAL_COMMUNITY)
Admission: RE | Admit: 2020-12-05 | Discharge: 2020-12-05 | Disposition: A | Discharge status: Home / Self Care | Payer: BC Managed Care – PPO | Source: Ambulatory Visit | Attending: Internal Medicine | Admitting: Internal Medicine

## 2020-12-05 ENCOUNTER — Other Ambulatory Visit: Payer: Self-pay

## 2020-12-05 VITALS — Ht 69.75 in | Wt 202.6 lb

## 2020-12-05 DIAGNOSIS — Z955 Presence of coronary angioplasty implant and graft: Secondary | ICD-10-CM | POA: Insufficient documentation

## 2020-12-05 DIAGNOSIS — I2121 ST elevation (STEMI) myocardial infarction involving left circumflex coronary artery: Secondary | ICD-10-CM | POA: Insufficient documentation

## 2020-12-05 NOTE — Progress Notes (Signed)
Discharge Progress Report  Patient Details  Name: ADEN SEK MRN: [id number] Date of Birth: 04-16-1955 Referring Provider:   Flowsheet Row CARDIAC REHAB PHASE II ORIENTATION from 10/11/2020 in [facility]  Referring Provider Dr. Glori Bickers, MD        Number of Visits: 23  Reason for Discharge:  Patient reached a stable level of exercise. Patient independent in their exercise. Patient has met program and personal goals.  Smoking History:  Social History   Tobacco Use  Smoking Status Former  Smokeless Tobacco Never  Tobacco Comments   social smoker in high School    Diagnosis:  02/15/20 STEMI  02/15/20 S/P PCI/DES LCfx  ADL [facility]:   Initial Exercise Prescription:  Initial Exercise Prescription - 10/11/20 1100       Date of Initial Exercise RX and Referring Provider   Date 10/11/20    Referring Provider Dr. Glori Bickers, MD    Expected Discharge Date 12/07/20      NuStep   Level 3    SPM 80    Minutes 15    METs 2.5      Arm Ergometer   Level 2    RPM 60    Minutes 15      Prescription Details   Frequency (times per week) 3    Duration Progress to 30 minutes of continuous aerobic without signs/symptoms of physical distress      Intensity   THRR 40-80% of Max Heartrate 63-126    Ratings of Perceived Exertion 11-13    Perceived Dyspnea 0-4      Progression   Progression Continue progressive overload as per policy without signs/symptoms or physical distress.      Resistance Training   Training Prescription Yes    Weight 4    Reps 10-15             Discharge Exercise Prescription (Final Exercise Prescription Changes):  Exercise Prescription Changes - 12/07/20 1000       Response to Exercise   Blood Pressure (Admit) 104/62    Blood Pressure (Exercise) 130/74    Blood Pressure (Exit) 114/70    Heart Rate (Admit) 68 bpm    Heart Rate (Exercise) 98 bpm    Heart Rate (Exit) 67 bpm    Rating of  Perceived Exertion (Exercise) 11    Symptoms None    Comments Pt graduated form the [facility]2 program    Duration Continue with 30 min of aerobic exercise without signs/symptoms of physical distress.    Intensity THRR unchanged      Progression   Progression Continue to progress workloads to maintain intensity without signs/symptoms of physical distress.    Average METs 4.8      Resistance Training   Training Prescription Yes    Weight 5 lbs    Reps 10-15    Time 10 Minutes      Interval Training   Interval Training No      NuStep   Level 6    SPM 95    Minutes 15    METs 4.8      Arm Ergometer   Level 6    Watts 32    RPM 60    Minutes 15    METs 3.8      Home Exercise Plan   Plans to continue exercise at Home (comment)    Frequency Add 2 additional days to program exercise sessions.    Initial Home Exercises Provided  10/26/20             Functional Capacity:  6 Minute Walk     Row Name 10/11/20 1112 12/05/20 0850       6 Minute Walk   Phase Initial Discharge    Distance 1865 feet 1939 feet    Distance % Change -- 4 %    Distance Feet Change -- 74 ft    Walk Time 6 minutes 6 minutes    # of Rest Breaks 0 0    MPH 3.53 3.67    METS 3.98 4.16    RPE 11 11    Perceived Dyspnea  0 0    VO2 Peak 13.93 14.55    Symptoms No No    Resting HR 72 bpm 76 bpm    Resting BP 98/60 112/60    Resting Oxygen Saturation  97 % 97 %    Exercise Oxygen Saturation  during 6 min walk 97 % 97 %    Max Ex. HR 89 bpm 96 bpm    Max Ex. BP 122/70 120/70    2 Minute Post BP 102/70 --             Psychological, QOL, Others - Outcomes: PHQ 2/9: Depression screen [facility] 2/9 12/11/2020 12/05/2020 10/11/2020 04/24/2020 03/20/2020  Decreased Interest 0 0 0 0 0  Down, Depressed, Hopeless 0 0 0 0 0  PHQ - 2 Score 0 0 0 0 0  Altered sleeping - - - - 0  Tired, decreased energy - - - - 3  Change in appetite - - - - 0  Feeling bad or failure about yourself  - - - - 0  Trouble  concentrating - - - - 0  Moving slowly or fidgety/restless - - - - 0  PHQ-9 Score - - - - 3    Quality of Life:  Quality of Life - 12/07/20 1032       Quality of Life Scores   Health/Function Pre 26.8 %    Health/Function Post 28 %    Health/Function % Change 4.48 %    Socioeconomic Pre 20.25 %    Socioeconomic Post 26.25 %    Socioeconomic % Change  29.63 %    Psych/Spiritual Pre 27.43 %    Psych/Spiritual Post 28.29 %    Psych/Spiritual % Change 3.14 %    Family Pre 30 %    Family Post 26.4 %    Family % Change -12 %    GLOBAL Pre 25.89 %    GLOBAL Post 27.43 %    GLOBAL % Change 5.95 %             Personal Goals: Goals established at orientation with interventions provided to work toward goal.  Personal Goals and Risk Factors at Admission - 10/11/20 1125       Core Components/Risk Factors/Patient Goals on Admission    Weight Management Yes;Weight Maintenance    Intervention Weight Management: Develop a combined nutrition and exercise program designed to reach desired caloric intake, while maintaining appropriate intake of nutrient and fiber, sodium and fats, and appropriate energy expenditure required for the weight goal.;Weight Management: Provide education and appropriate resources to help participant work on and attain dietary goals.    Expected Outcomes Weight Maintenance: Understanding of the daily nutrition guidelines, which includes 25-35% calories from fat, 7% or less cal from saturated fats, less than 221m cholesterol, less than 1.5gm of sodium, & 5 or more servings of fruits  and vegetables daily    Heart Failure Yes    Intervention Provide a combined exercise and nutrition program that is supplemented with education, support and counseling about heart failure. Directed toward relieving symptoms such as shortness of breath, decreased exercise tolerance, and extremity edema.    Expected Outcomes Short term: Attendance in program 2-3 days a week with increased  exercise capacity. Reported lower sodium intake. Reported increased fruit and vegetable intake. Reports medication compliance.;Short term: Daily weights obtained and reported for increase. Utilizing diuretic protocols set by physician.;Long term: Adoption of self-care skills and reduction of barriers for early signs and symptoms recognition and intervention leading to self-care maintenance.    Hypertension Yes    Intervention Provide education on lifestyle modifcations including regular physical activity/exercise, weight management, moderate sodium restriction and increased consumption of fresh fruit, vegetables, and low fat dairy, alcohol moderation, and smoking cessation.;Monitor prescription use compliance.    Expected Outcomes Short Term: Continued assessment and intervention until BP is < 140/107m HG in hypertensive participants. < 130/831mHG in hypertensive participants with diabetes, heart failure or chronic kidney disease.;Long Term: Maintenance of blood pressure at goal levels.    Lipids Yes    Intervention Provide education and support for participant on nutrition & aerobic/resistive exercise along with prescribed medications to achieve LDL <7016mHDL >3m60m  Expected Outcomes Short Term: Participant states understanding of desired cholesterol values and is compliant with medications prescribed. Participant is following exercise prescription and nutrition guidelines.;Long Term: Cholesterol controlled with medications as prescribed, with individualized exercise RX and with personalized nutrition plan. Value goals: LDL < 70mg24mL > 40 mg.    Stress Yes    Intervention Offer individual and/or small group education and counseling on adjustment to heart disease, stress management and health-related lifestyle change. Teach and support self-help strategies.;Refer participants experiencing significant psychosocial distress to appropriate mental health specialists for further evaluation and treatment.  When possible, include family members and significant others in education/counseling sessions.    Expected Outcomes Short Term: Participant demonstrates changes in health-related behavior, relaxation and other stress management skills, ability to obtain effective social support, and compliance with psychotropic medications if prescribed.;Long Term: Emotional wellbeing is indicated by absence of clinically significant psychosocial distress or social isolation.    Personal Goal Other Yes    Personal Goal Short term: wants to return to hunting and fishing. Long term: Increase stamina and strength to pick up grandkids    Intervention Will continue to monitor pt and progress workloads as tolerated without sign or symptom    Expected Outcomes Pt will achieve his goals              Personal Goals Discharge:  Goals and Risk Factor Review     Row Name 10/15/20 1010 11/07/20 1235 12/05/20 1715         Core Components/Risk Factors/Patient Goals Review   Personal Goals Review Weight Management/Obesity;Heart Failure;Stress;Hypertension;Lipids Weight Management/Obesity;Heart Failure;Stress;Hypertension;Lipids Weight Management/Obesity;Heart Failure;Stress;Hypertension;Lipids     Review Purnell started exercise at cardiac rehab on 10/15/20 and did well with exercise, Vital signs stable Franz Weilandbeen doing well with exercise at phase 2 cardiac rehab, Vital signs have been stable Aydan Bryambeen doing well with exercise at phase 2 cardiac rehab, Vital signs have been stable Terald Leaf complete cardiac rehab on 12/07/20     Expected Outcomes Jeremi Deontray continue to participate in phase 2 cardiac rehab for exercise, nutrition and life style modifiactions. Delante Kahron continue to participate in phase 2  cardiac rehab for exercise, nutrition and life style modifiactions. Lennie will continue to  exercise,  follow nutrition and life style modifiactions upon completion of phase 2 cardiac rehab.              Exercise Goals  and Review:  Exercise Goals     Row Name 10/11/20 1122             Exercise Goals   Increase Physical Activity Yes       Intervention Provide advice, education, support and counseling about physical activity/exercise needs.;Develop an individualized exercise prescription for aerobic and resistive training based on initial evaluation findings, risk stratification, comorbidities and participant's personal goals.       Expected Outcomes Short Term: Attend rehab on a regular basis to increase amount of physical activity.;Long Term: Add in home exercise to make exercise part of routine and to increase amount of physical activity.;Long Term: Exercising regularly at least 3-5 days a week.       Increase Strength and Stamina Yes       Intervention Provide advice, education, support and counseling about physical activity/exercise needs.;Develop an individualized exercise prescription for aerobic and resistive training based on initial evaluation findings, risk stratification, comorbidities and participant's personal goals.       Expected Outcomes Short Term: Increase workloads from initial exercise prescription for resistance, speed, and METs.;Short Term: Perform resistance training exercises routinely during rehab and add in resistance training at home;Long Term: Improve cardiorespiratory fitness, muscular endurance and strength as measured by increased METs and functional capacity (6MWT)       Able to understand and use rate of perceived exertion (RPE) scale Yes       Intervention Provide education and explanation on how to use RPE scale       Expected Outcomes Short Term: Able to use RPE daily in rehab to express subjective intensity level;Long Term:  Able to use RPE to guide intensity level when exercising independently       Knowledge and understanding of Target Heart Rate Range (THRR) Yes       Intervention Provide education and explanation of THRR including how the numbers were predicted and where  they are located for reference       Expected Outcomes Short Term: Able to state/look up THRR;Long Term: Able to use THRR to govern intensity when exercising independently;Short Term: Able to use daily as guideline for intensity in rehab       Understanding of Exercise Prescription Yes       Intervention Provide education, explanation, and written materials on patient's individual exercise prescription       Expected Outcomes Short Term: Able to explain program exercise prescription;Long Term: Able to explain home exercise prescription to exercise independently                Exercise Goals Re-Evaluation:  Exercise Goals Re-Evaluation     Row Name 10/15/20 1032 10/26/20 1026 11/12/20 1000 12/03/20 1005 12/07/20 1013     Exercise Goal Re-Evaluation   Exercise Goals Review Increase Physical Activity;Increase Strength and Stamina;Able to understand and use rate of perceived exertion (RPE) scale;Knowledge and understanding of Target Heart Rate Range (THRR);Able to check pulse independently;Understanding of Exercise Prescription Increase Physical Activity;Increase Strength and Stamina;Able to understand and use rate of perceived exertion (RPE) scale;Knowledge and understanding of Target Heart Rate Range (THRR);Understanding of Exercise Prescription;Able to check pulse independently Increase Physical Activity;Increase Strength and Stamina;Able to understand and use rate of perceived exertion (RPE) scale;Knowledge  and understanding of Target Heart Rate Range (THRR);Able to check pulse independently;Understanding of Exercise Prescription Increase Physical Activity;Increase Strength and Stamina;Able to understand and use rate of perceived exertion (RPE) scale;Knowledge and understanding of Target Heart Rate Range (THRR);Able to check pulse independently;Understanding of Exercise Prescription Increase Physical Activity;Increase Strength and Stamina;Able to understand and use rate of perceived exertion (RPE)  scale;Knowledge and understanding of Target Heart Rate Range (THRR);Able to check pulse independently;Understanding of Exercise Prescription   Comments Pt's first day in the [facility] program. Pt understands the exercise Rx, THRR, and RPE scale. Reviewed home exercise and METs. Pt will walk at home 2-3x/week for 30 minutes. Reviewed METs and goals. Pt voices feeling stronger. A patient goal was to hold his grandchildren and he has been able to do more of that and feeling good about it. His stamina is increased and is making good progress on his exercise MET level. Reviewed METs and Goals. Pt cotinues to report progress to goal of increased strength and stamina. Pt tolerates exercise bout well with no complaints. Pt graduated from the [place] program. PT reports feeling stronger and having more stamina. Pt is holding his grandchild without getting "shaky arms" Pt has made good progress to goal. Pt has been stacking wood at home as well.   Expected Outcomes Will continue to monitor patient and progress exercise workloads as tolerated. Pt will begin to walk at home in addtion to the [place] program Will continue to monitor and progress exercise workloads as tolerated. Will continue to monitor and progress exercise workloads as tolerated. Pt will continue to exercise at home with his spouse.            Nutrition & Weight - Outcomes:  Pre Biometrics - 10/11/20 1109       Pre Biometrics   Waist Circumference 42 inches    Hip Circumference 43 inches    Waist to Hip Ratio 0.98 %    Triceps Skinfold 22 mm    % Body Fat 30.3 %    Grip Strength 35 kg    Flexibility 12.75 in    Single Leg Stand 28.8 seconds             Post Biometrics - 12/05/20 1012        Post  Biometrics   Height 5' 9.75" (1.772 m)    Weight 91.9 kg    Waist Circumference 42 inches    Hip Circumference 43 inches    Waist to Hip Ratio 0.98 %    BMI (Calculated) 29.27    Triceps Skinfold 15 mm    % Body Fat 28.8 %    Grip Strength  42 kg    Flexibility 13 in    Single Leg Stand 30 seconds             Nutrition:  Nutrition Therapy & Goals - 10/19/20 1018       Nutrition Therapy   Diet TLC    Drug/Food Interactions Statins/Certain Fruits      Personal Nutrition Goals   Nutrition Goal Pt to build a healthy plate including vegetables, fruits, whole grains, and low-fat dairy products in a heart healthy meal plan.    Personal Goal #2 Pt to eat 3 servings whole grains each day      Intervention Plan   Intervention Nutrition handout(s) given to patient.;Prescribe, educate and counsel regarding individualized specific dietary modifications aiming towards targeted core components such as weight, hypertension, lipid management, diabetes, heart failure and other comorbidities.  Expected Outcomes Short Term Goal: A plan has been developed with personal nutrition goals set during dietitian appointment.;Long Term Goal: Adherence to prescribed nutrition plan.             Nutrition Discharge:   Education Questionnaire Score:  Knowledge Questionnaire Score - 12/07/20 1035       Knowledge Questionnaire Score   Post Score 23/24             Goals reviewed with patient; copy given to patient.Pt graduated from cardiac rehab program on 12/05/20 with completion of  exercise sessions in Phase II. Pt maintained good attendance and progressed nicely during his participation in rehab as evidenced by increased MET level. [place] increased his distance on his post exercise walk test by 74 feet  Medication list reconciled. Repeat  PHQ score- 0 .  Pt has made significant lifestyle changes and should be commended for his success. Pt feels he has achieved his goals during cardiac rehab.   Pt plans to continue exercise by walking and may consider joining the [facility] at [facility].Barnet Pall, RN,BSN 12/11/2020 8:07 AM

## 2020-12-05 NOTE — Progress Notes (Signed)
[facility] Individual Treatment Plan  Patient Details  Name: Jeffrey Morse MRN: [id number] Date of Birth: 1955-08-27 Referring Provider:   Flowsheet Row CARDIAC REHAB PHASE II ORIENTATION from 10/11/2020 in [place] [facility]  Referring Provider Dr. Glori Bickers, MD       Initial Encounter Date:  [place] PHASE II ORIENTATION from 10/11/2020 in [place]  Date 10/11/20       Visit Diagnosis: 02/15/20 STEMI  02/15/20 S/P PCI/DES LCfx  Patient's Home Medications on Admission:  Current Outpatient Medications:    acetaminophen (TYLENOL) 325 MG tablet, Take 2 tablets (650 mg total) by mouth every 4 (four) hours as needed for mild pain (or temp > 37.5 C (99.5 F))., Disp: , Rfl:    Alirocumab (PRALUENT) 75 MG/ML SOAJ, Inject 1 pen into the skin every 14 (fourteen) days., Disp: 6 mL, Rfl: 3   aspirin EC 81 MG tablet, Take 81 mg by mouth daily. Swallow whole., Disp: , Rfl:    carvedilol (COREG) 3.125 MG tablet, Take 1 tablet (3.125 mg total) by mouth 2 (two) times daily with a meal., Disp: 60 tablet, Rfl: 11   clopidogrel (PLAVIX) 75 MG tablet, Take 1 tablet (75 mg total) by mouth daily., Disp: 30 tablet, Rfl: 11   empagliflozin (JARDIANCE) 10 MG TABS tablet, Take 1 tablet (10 mg total) by mouth daily., Disp: 30 tablet, Rfl: 11   ENTRESTO 49-51 MG, TAKE 1 TABLET BY MOUTH TWICE DAILY, Disp: 180 tablet, Rfl: 0   escitalopram (LEXAPRO) 20 MG tablet, Take 20 mg by mouth daily., Disp: , Rfl:    ezetimibe (ZETIA) 10 MG tablet, Take 1 tablet (10 mg total) by mouth daily., Disp: 90 tablet, Rfl: 3   Multiple Vitamin (MULTI VITAMIN) TABS, Take 1 tablet by mouth daily. Centrum silver, Disp: , Rfl:    nitroGLYCERIN (NITROSTAT) 0.4 MG SL tablet, Place 1 tablet (0.4 mg total) under the tongue every 5 (five) minutes as needed for chest pain., Disp: 25 tablet, Rfl: 3   omeprazole (PRILOSEC) 20 MG capsule, Take 1 capsule (20 mg  total) by mouth daily., Disp: 30 capsule, Rfl: 0   rosuvastatin (CRESTOR) 40 MG tablet, Take 1 tablet (40 mg total) by mouth daily., Disp: 30 tablet, Rfl: 11   spironolactone (ALDACTONE) 25 MG tablet, Take 1 tablet (25 mg total) by mouth daily., Disp: 90 tablet, Rfl: 3  Past Medical History: Past Medical History:  Diagnosis Date   HTN (hypertension)    Hypercholesteremia    Hypertension    Borderline hypertension   Sleep apnea    Stroke ([place])     Tobacco Use: Social History   Tobacco Use  Smoking Status Former  Smokeless Tobacco Never  Tobacco Comments   social smoker in high School    Labs: Recent Review Flowsheet Data     Labs for ITP Cardiac and Pulmonary Rehab Latest Ref Rng & Units 02/23/2020 02/24/2020 02/26/2020 07/16/2020 09/13/2020   Cholestrol 0 - 200 mg/dL - - - 178 170   LDLCALC 0 - 99 mg/dL - - - 104(H) 95   HDL >40 mg/dL - - - 45 56   Trlycerides <150 mg/dL - - - 143 93   Hemoglobin A1c 4.8 - 5.6 % - - - - -   PHART 7.350 - 7.450 - - 7.489(H) - -   PCO2ART 32.0 - 48.0 mmHg - - 31.7(L) - -   HCO3 20.0 - 28.0 mmol/L - - 24.2 - -  TCO2 22 - 32 mmol/L - - 25 - -   ACIDBASEDEF 0.0 - 2.0 mmol/L - - - - -   O2SAT % 71.7 66.3 94.0 - -       Capillary Blood Glucose: Lab Results  Component Value Date   GLUCAP 114 (H) 04/04/2020   GLUCAP 102 (H) 03/09/2020   GLUCAP 128 (H) 03/08/2020   GLUCAP 155 (H) 03/08/2020   GLUCAP 156 (H) 03/08/2020     Exercise Target Goals: Exercise Program Goal: Individual exercise prescription set using results from initial 6 min walk test and THRR while considering  patient's activity barriers and safety.   Exercise Prescription Goal: Starting with aerobic activity 30 plus minutes a day, 3 days per week for initial exercise prescription. Provide home exercise prescription and guidelines that participant acknowledges understanding prior to discharge.  Activity Barriers & Risk Stratification:  Activity Barriers & Cardiac Risk  Stratification - 10/11/20 1115       Activity Barriers & Cardiac Risk Stratification   Activity Barriers Back Problems    Cardiac Risk Stratification High             6 Minute Walk:  6 Minute Walk     Row Name 10/11/20 1112 12/05/20 0850       6 Minute Walk   Phase Initial Discharge    Distance 1865 feet 1939 feet    Distance % Change -- 4 %    Distance Feet Change -- 74 ft    Walk Time 6 minutes 6 minutes    # of Rest Breaks 0 0    MPH 3.53 3.67    METS 3.98 4.16    RPE 11 11    Perceived Dyspnea  0 0    VO2 Peak 13.93 14.55    Symptoms No No    Resting HR 72 bpm 76 bpm    Resting BP 98/60 112/60    Resting Oxygen Saturation  97 % 97 %    Exercise Oxygen Saturation  during 6 min walk 97 % 97 %    Max Ex. HR 89 bpm 96 bpm    Max Ex. BP 122/70 120/70    2 Minute Post BP 102/70 --             Oxygen Initial Assessment:   Oxygen Re-Evaluation:   Oxygen Discharge (Final Oxygen Re-Evaluation):   Initial Exercise Prescription:  Initial Exercise Prescription - 10/11/20 1100       Date of Initial Exercise RX and Referring Provider   Date 10/11/20    Referring Provider Dr. Glori Bickers, MD    Expected Discharge Date 12/07/20      NuStep   Level 3    SPM 80    Minutes 15    METs 2.5      Arm Ergometer   Level 2    RPM 60    Minutes 15      Prescription Details   Frequency (times per week) 3    Duration Progress to 30 minutes of continuous aerobic without signs/symptoms of physical distress      Intensity   THRR 40-80% of Max Heartrate 63-126    Ratings of Perceived Exertion 11-13    Perceived Dyspnea 0-4      Progression   Progression Continue progressive overload as per policy without signs/symptoms or physical distress.      Resistance Training   Training Prescription Yes    Weight 4    Reps 10-15  Perform Capillary Blood Glucose checks as needed.  Exercise Prescription Changes:   Exercise Prescription  Changes     Row Name 10/15/20 1000 10/26/20 1000 11/12/20 1000 12/03/20 1010       Response to Exercise   Blood Pressure (Admit) 108/62 116/78 104/62 116/60    Blood Pressure (Exercise) 124/74 132/78 124/72 132/74    Blood Pressure (Exit) 104/70 104/70 98/66 104/68    Heart Rate (Admit) 81 bpm 74 bpm 96 bpm 77 bpm    Heart Rate (Exercise) 79 bpm 77 bpm 114 bpm 111 bpm    Heart Rate (Exit) 75 bpm 73 bpm 87 bpm 83 bpm    Rating of Perceived Exertion (Exercise) _0 Symptoms None None None None    Comments Pt's first day in the CRP2 program Reviewed METs and home exercise with patient Reviewed METs and Goals Reviewed METs and Goals    Duration Continue with 30 min of aerobic exercise without signs/symptoms of physical distress. Continue with 30 min of aerobic exercise without signs/symptoms of physical distress. Continue with 30 min of aerobic exercise without signs/symptoms of physical distress. Continue with 30 min of aerobic exercise without signs/symptoms of physical distress.    Intensity THRR unchanged THRR unchanged THRR unchanged THRR unchanged      Progression   Progression Continue to progress workloads to maintain intensity without signs/symptoms of physical distress. Continue to progress workloads to maintain intensity without signs/symptoms of physical distress. Continue to progress workloads to maintain intensity without signs/symptoms of physical distress. Continue to progress workloads to maintain intensity without signs/symptoms of physical distress.    Average METs 2.2 2.5 3.1 4.8      Resistance Training   Training Prescription Yes Yes Yes Yes    Weight 4 lbs 5 lbs 5 lbs 5 lbs    Reps 10-15 10-15 10-15 10-15    Time 10 Minutes 10 Minutes 10 Minutes 10 Minutes      Interval Training   Interval Training No No No No      NuStep   Level _1 SPM 80 85 95 95    Minutes _2 METs 2.2 2.5 4.3 4.8      Arm Ergometer   Level _3 5.5    Watts _4 RPM 60 60 60 60    Minutes _5 METs -- -- 1.8 --      Home Exercise Plan   Plans to continue exercise at -- Home (comment) Home (comment) Home (comment)    Frequency -- Add 2 additional days to program exercise sessions. Add 2 additional days to program exercise sessions. Add 2 additional days to program exercise sessions.    Initial Home Exercises Provided -- 10/26/20 10/26/20 10/26/20             Exercise Comments:   Exercise Comments     Row Name 10/15/20 1034 10/26/20 1042 11/12/20 1000 12/03/20 1005     Exercise Comments Pt's first day in the CRP2 program. Pt tolerated the session well with no complaints. Reviewed home exercise Rx and METs with patient. Pt verbalized understnading of the home exercise Rx and was provided a copy. Reviewed METs and goals. Pt is making good progress in the CRP2 program, Walking at home with wife in addtion to the CR program. Reviewed METs and goals. Making good progress  in the CRP2 program. Pt is making good progress voices he was able to walk around with his 57-monthold grandchild and his arms "did't get shaky."             Exercise Goals and Review:   Exercise Goals     Row Name 10/11/20 1122             Exercise Goals   Increase Physical Activity Yes       Intervention Provide advice, education, support and counseling about physical activity/exercise needs.;Develop an individualized exercise prescription for aerobic and resistive training based on initial evaluation findings, risk stratification, comorbidities and participant's personal goals.       Expected Outcomes Short Term: Attend rehab on a regular basis to increase amount of physical activity.;Long Term: Add in home exercise to make exercise part of routine and to increase amount of physical activity.;Long Term: Exercising regularly at least 3-5 days a week.       Increase Strength and Stamina Yes       Intervention Provide advice, education, support and  counseling about physical activity/exercise needs.;Develop an individualized exercise prescription for aerobic and resistive training based on initial evaluation findings, risk stratification, comorbidities and participant's personal goals.       Expected Outcomes Short Term: Increase workloads from initial exercise prescription for resistance, speed, and METs.;Short Term: Perform resistance training exercises routinely during rehab and add in resistance training at home;Long Term: Improve cardiorespiratory fitness, muscular endurance and strength as measured by increased METs and functional capacity (6MWT)       Able to understand and use rate of perceived exertion (RPE) scale Yes       Intervention Provide education and explanation on how to use RPE scale       Expected Outcomes Short Term: Able to use RPE daily in rehab to express subjective intensity level;Long Term:  Able to use RPE to guide intensity level when exercising independently       Knowledge and understanding of Target Heart Rate Range (THRR) Yes       Intervention Provide education and explanation of THRR including how the numbers were predicted and where they are located for reference       Expected Outcomes Short Term: Able to state/look up THRR;Long Term: Able to use THRR to govern intensity when exercising independently;Short Term: Able to use daily as guideline for intensity in rehab       Understanding of Exercise Prescription Yes       Intervention Provide education, explanation, and written materials on patient's individual exercise prescription       Expected Outcomes Short Term: Able to explain program exercise prescription;Long Term: Able to explain home exercise prescription to exercise independently                Exercise Goals Re-Evaluation :  Exercise Goals Re-Evaluation     Row Name 10/15/20 1032 10/26/20 1026 11/12/20 1000 12/03/20 1005       Exercise Goal Re-Evaluation   Exercise Goals Review Increase  Physical Activity;Increase Strength and Stamina;Able to understand and use rate of perceived exertion (RPE) scale;Knowledge and understanding of Target Heart Rate Range (THRR);Able to check pulse independently;Understanding of Exercise Prescription Increase Physical Activity;Increase Strength and Stamina;Able to understand and use rate of perceived exertion (RPE) scale;Knowledge and understanding of Target Heart Rate Range (THRR);Understanding of Exercise Prescription;Able to check pulse independently Increase Physical Activity;Increase Strength and Stamina;Able to understand and use rate of perceived exertion (RPE) scale;Knowledge and  understanding of Target Heart Rate Range (THRR);Able to check pulse independently;Understanding of Exercise Prescription Increase Physical Activity;Increase Strength and Stamina;Able to understand and use rate of perceived exertion (RPE) scale;Knowledge and understanding of Target Heart Rate Range (THRR);Able to check pulse independently;Understanding of Exercise Prescription    Comments Pt's first day in the CRP2 program. Pt understands the exercise Rx, THRR, and RPE scale. Reviewed home exercise and METs. Pt will walk at home 2-3x/week for 30 minutes. Reviewed METs and goals. Pt voices feeling stronger. A patient goal was to hold his grandchildren and he has been able to do more of that and feeling good about it. His stamina is increased and is making good progress on his exercise MET level. Reviewed METs and Goals. Pt cotinues to report progress to goal of increased strength and stamina. Pt tolerates exercise bout well with no complaints.    Expected Outcomes Will continue to monitor patient and progress exercise workloads as tolerated. Pt will begin to walk at home in addtion to the [place] program Will continue to monitor and progress exercise workloads as tolerated. Will continue to monitor and progress exercise workloads as tolerated.              Discharge Exercise  Prescription (Final Exercise Prescription Changes):  Exercise Prescription Changes - 12/03/20 1010       Response to Exercise   Blood Pressure (Admit) 116/60    Blood Pressure (Exercise) 132/74    Blood Pressure (Exit) 104/68    Heart Rate (Admit) 77 bpm    Heart Rate (Exercise) 111 bpm    Heart Rate (Exit) 83 bpm    Rating of Perceived Exertion (Exercise) 13    Symptoms None    Comments Reviewed METs and Goals    Duration Continue with 30 min of aerobic exercise without signs/symptoms of physical distress.    Intensity THRR unchanged      Progression   Progression Continue to progress workloads to maintain intensity without signs/symptoms of physical distress.    Average METs 4.8      Resistance Training   Training Prescription Yes    Weight 5 lbs    Reps 10-15    Time 10 Minutes      Interval Training   Interval Training No      NuStep   Level 6    SPM 95    Minutes 15    METs 4.8      Arm Ergometer   Level 5.5    Watts 14    RPM 60    Minutes 15      Home Exercise Plan   Plans to continue exercise at Home (comment)    Frequency Add 2 additional days to program exercise sessions.    Initial Home Exercises Provided 10/26/20             Nutrition:  Target Goals: Understanding of nutrition guidelines, daily intake of sodium <1577m, cholesterol <2051m calories 30% from fat and 7% or less from saturated fats, daily to have 5 or more servings of fruits and vegetables.  Biometrics:  Pre Biometrics - 10/11/20 1109       Pre Biometrics   Waist Circumference 42 inches    Hip Circumference 43 inches    Waist to Hip Ratio 0.98 %    Triceps Skinfold 22 mm    % Body Fat 30.3 %    Grip Strength 35 kg    Flexibility 12.75 in    Single Leg Stand 28.8  seconds             Post Biometrics - 12/05/20 1012        Post  Biometrics   Height 5' 9.75" (1.772 m)    Weight 91.9 kg    Waist Circumference 42 inches    Hip Circumference 43 inches    Waist to  Hip Ratio 0.98 %    BMI (Calculated) 29.27    Triceps Skinfold 15 mm    % Body Fat 28.8 %    Grip Strength 42 kg    Flexibility 13 in    Single Leg Stand 30 seconds             Nutrition Therapy Plan and Nutrition Goals:  Nutrition Therapy & Goals - 10/19/20 1018       Nutrition Therapy   Diet TLC    Drug/Food Interactions Statins/Certain Fruits      Personal Nutrition Goals   Nutrition Goal Pt to build a healthy plate including vegetables, fruits, whole grains, and low-fat dairy products in a heart healthy meal plan.    Personal Goal #2 Pt to eat 3 servings whole grains each day      Intervention Plan   Intervention Nutrition handout(s) given to patient.;Prescribe, educate and counsel regarding individualized specific dietary modifications aiming towards targeted core components such as weight, hypertension, lipid management, diabetes, heart failure and other comorbidities.    Expected Outcomes Short Term Goal: A plan has been developed with personal nutrition goals set during dietitian appointment.;Long Term Goal: Adherence to prescribed nutrition plan.             Nutrition Assessments:  MEDIFICTS Score Key: ?70 Need to make dietary changes  40-70 Heart Healthy Diet ? 40 Therapeutic Level Cholesterol Diet  Flowsheet Row CARDIAC REHAB PHASE II EXERCISE from 10/17/2020 in [facility]  Picture Your Plate Total Score on Admission 56      Picture Your Plate Scores: <12 Unhealthy dietary pattern with much room for improvement. 41-50 Dietary pattern unlikely to meet recommendations for good health and room for improvement. 51-60 More healthful dietary pattern, with some room for improvement.  >60 Healthy dietary pattern, although there may be some specific behaviors that could be improved.    Nutrition Goals Re-Evaluation:  Nutrition Goals Re-Evaluation     [place] Name 10/19/20 1018             Goals   Current Weight 202 lb (91.6  kg)                Nutrition Goals Discharge (Final Nutrition Goals Re-Evaluation):  Nutrition Goals Re-Evaluation - 10/19/20 1018       Goals   Current Weight 202 lb (91.6 kg)             Psychosocial: Target Goals: Acknowledge presence or absence of significant depression and/or stress, maximize coping skills, provide positive support system. Participant is able to verbalize types and ability to use techniques and skills needed for reducing stress and depression.  Initial Review & Psychosocial Screening:  Initial Psych Review & Screening - 10/11/20 1548       Initial Review   Current issues with Current Stress Concerns    Source of Stress Concerns Financial;Unable to perform yard/household activities    Comments Jeffrey Morse denies being depressed. Jeffrey Morse says he will not be able to return to his previous job. Jeffrey Morse admits to [employer] some stress regarding retirement/ disabilty.      Family  Dynamics   Good Support System? Yes   Ardean has his wife for support     Barriers   Psychosocial barriers to participate in program The patient should benefit from training in stress management and relaxation.      Screening Interventions   Interventions Encouraged to exercise    Expected Outcomes Long Term Goal: Stressors or current issues are controlled or eliminated.             Quality of Life Scores:  Quality of Life - 10/11/20 1123       Quality of Life   Select Quality of Life      Quality of Life Scores   Health/Function Pre 26.8 %    Socioeconomic Pre 20.25 %    Psych/Spiritual Pre 27.43 %    Family Pre 30 %    GLOBAL Pre 25.89 %            Scores of 19 and below usually indicate a poorer quality of life in these areas.  A difference of  2-3 points is a clinically meaningful difference.  A difference of 2-3 points in the total score of the Quality of Life Index has been associated with significant improvement in overall quality of life, self-image, physical  symptoms, and general health in studies assessing change in quality of life.  PHQ-9: Recent Review Flowsheet Data     Depression screen [facility] 2/9 12/05/2020 10/11/2020 04/24/2020 03/20/2020   Decreased Interest 0 0 0 0   Down, Depressed, Hopeless 0 0 0 0   PHQ - 2 Score 0 0 0 0   Altered sleeping - - - 0   Tired, decreased energy - - - 3   Change in appetite - - - 0   Feeling bad or failure about yourself  - - - 0   Trouble concentrating - - - 0   Moving slowly or fidgety/restless - - - 0   PHQ-9 Score - - - 3      Interpretation of Total Score  Total Score Depression Severity:  1-4 = Minimal depression, 5-9 = Mild depression, 10-14 = Moderate depression, 15-19 = Moderately severe depression, 20-27 = Severe depression   Psychosocial Evaluation and Intervention:   Psychosocial Re-Evaluation:  Psychosocial Re-Evaluation     Row Name 11/07/20 1233 12/05/20 1714           Psychosocial Re-Evaluation   Current issues with Current Stress Concerns Current Stress Concerns      Comments Jeffrey Morse has not voiced any increased stressors or concerns since participating in phase 2 cardiac rehab. Jeffrey Morse has not voiced any increased stressors or concerns since participating in phase 2 cardiac rehab. Jeffrey Morse will complete cardiac rehab on 12/07/20.      Expected Outcomes Jeffrey Morse will have decreased on controlled stresssors upon completion of phase 2 cardiac rehab. Jeffrey Morse will have decreased on controlled stresssors upon completion of phase 2 cardiac rehab.      Interventions Stress management education;Encouraged to attend Cardiac Rehabilitation for the exercise Stress management education;Encouraged to attend Cardiac Rehabilitation for the exercise      Continue Psychosocial Services  No Follow up required No Follow up required        Initial Review   Source of Stress Concerns Chronic Illness;Unable to perform yard/household activities;Retirement/disability;Occupation Chronic Illness;Unable to perform  yard/household activities;Retirement/disability;Occupation      Comments Will continue to monitor and offer support as needed Will continue to monitor and offer support as needed  Psychosocial Discharge (Final Psychosocial Re-Evaluation):  Psychosocial Re-Evaluation - 12/05/20 1714       Psychosocial Re-Evaluation   Current issues with Current Stress Concerns    Comments Jeffrey Morse has not voiced any increased stressors or concerns since participating in phase 2 cardiac rehab. Jeffrey Morse will complete cardiac rehab on 12/07/20.    Expected Outcomes [place] will have decreased on controlled stresssors upon completion of phase 2 cardiac rehab.    Interventions Stress management education;Encouraged to attend Cardiac Rehabilitation for the exercise    Continue Psychosocial Services  No Follow up required      Initial Review   Source of Stress Concerns Chronic Illness;Unable to perform yard/household activities;Retirement/disability;Occupation    Comments Will continue to monitor and offer support as needed             Vocational Rehabilitation: Provide vocational rehab assistance to qualifying candidates.   Vocational Rehab Evaluation & Intervention:  Vocational Rehab - 10/11/20 1551       Initial Vocational Rehab Evaluation & Intervention   Assessment shows need for Vocational Rehabilitation No   Jeffrey Morse is not working currently and is in the process of applying for disability            Education: Education Goals: Education classes will be provided on a weekly basis, covering required topics. Participant will state understanding/return demonstration of topics presented.  Learning Barriers/Preferences:  Learning Barriers/Preferences - 10/11/20 1124       Learning Barriers/Preferences   Learning Barriers Hearing;Sight    Learning Preferences Group Instruction;Individual Instruction;Pictoral;Skilled Demonstration;Written Material;Video             Education  Topics: Hypertension, Hypertension Reduction -Define heart disease and high blood pressure. Discus how high blood pressure affects the body and ways to reduce high blood pressure.   Exercise and Your Heart -Discuss why it is important to exercise, the FITT principles of exercise, normal and abnormal responses to exercise, and how to exercise safely.   Angina -Discuss definition of angina, causes of angina, treatment of angina, and how to decrease risk of having angina.   Cardiac Medications -Review what the following cardiac medications are used for, how they affect the body, and side effects that may occur when taking the medications.  Medications include Aspirin, Beta blockers, calcium channel blockers, ACE Inhibitors, angiotensin receptor blockers, diuretics, digoxin, and antihyperlipidemics.   Congestive Heart Failure -Discuss the definition of CHF, how to live with CHF, the signs and symptoms of CHF, and how keep track of weight and sodium intake.   Heart Disease and Intimacy -Discus the effect sexual activity has on the heart, how changes occur during intimacy as we age, and safety during sexual activity.   Smoking Cessation / COPD -Discuss different methods to quit smoking, the health benefits of quitting smoking, and the definition of COPD.   Nutrition I: Fats -Discuss the types of cholesterol, what cholesterol does to the heart, and how cholesterol levels can be controlled.   Nutrition II: Labels -Discuss the different components of food labels and how to read food label   Heart Parts/Heart Disease and PAD -Discuss the anatomy of the heart, the pathway of blood circulation through the heart, and these are affected by heart disease.   Stress I: Signs and Symptoms -Discuss the causes of stress, how stress may lead to anxiety and depression, and ways to limit stress.   Stress II: Relaxation -Discuss different types of relaxation techniques to limit  stress.   Warning Signs of Stroke /  TIA -Discuss definition of a stroke, what the signs and symptoms are of a stroke, and how to identify when someone is having stroke.   Knowledge Questionnaire Score:  Knowledge Questionnaire Score - 10/11/20 1125       Knowledge Questionnaire Score   Pre Score 19/24             Core Components/Risk Factors/Patient Goals at Admission:  Personal Goals and Risk Factors at Admission - 10/11/20 1125       Core Components/Risk Factors/Patient Goals on Admission    Weight Management Yes;Weight Maintenance    Intervention Weight Management: Develop a combined nutrition and exercise program designed to reach desired caloric intake, while maintaining appropriate intake of nutrient and fiber, sodium and fats, and appropriate energy expenditure required for the weight goal.;Weight Management: Provide education and appropriate resources to help participant work on and attain dietary goals.    Expected Outcomes Weight Maintenance: Understanding of the daily nutrition guidelines, which includes 25-35% calories from fat, 7% or less cal from saturated fats, less than 272m cholesterol, less than 1.5gm of sodium, & 5 or more servings of fruits and vegetables daily    Heart Failure Yes    Intervention Provide a combined exercise and nutrition program that is supplemented with education, support and counseling about heart failure. Directed toward relieving symptoms such as shortness of breath, decreased exercise tolerance, and extremity edema.    Expected Outcomes Short term: Attendance in program 2-3 days a week with increased exercise capacity. Reported lower sodium intake. Reported increased fruit and vegetable intake. Reports medication compliance.;Short term: Daily weights obtained and reported for increase. Utilizing diuretic protocols set by physician.;Long term: Adoption of self-care skills and reduction of barriers for early signs and symptoms recognition and  intervention leading to self-care maintenance.    Hypertension Yes    Intervention Provide education on lifestyle modifcations including regular physical activity/exercise, weight management, moderate sodium restriction and increased consumption of fresh fruit, vegetables, and low fat dairy, alcohol moderation, and smoking cessation.;Monitor prescription use compliance.    Expected Outcomes Short Term: Continued assessment and intervention until BP is < 140/955mHG in hypertensive participants. < 130/8086mG in hypertensive participants with diabetes, heart failure or chronic kidney disease.;Long Term: Maintenance of blood pressure at goal levels.    Lipids Yes    Intervention Provide education and support for participant on nutrition & aerobic/resistive exercise along with prescribed medications to achieve LDL <18m52mDL >40mg21m Expected Outcomes Short Term: Participant states understanding of desired cholesterol values and is compliant with medications prescribed. Participant is following exercise prescription and nutrition guidelines.;Long Term: Cholesterol controlled with medications as prescribed, with individualized exercise RX and with personalized nutrition plan. Value goals: LDL < 18mg,31m > 40 mg.    Stress Yes    Intervention Offer individual and/or small group education and counseling on adjustment to heart disease, stress management and health-related lifestyle change. Teach and support self-help strategies.;Refer participants experiencing significant psychosocial distress to appropriate mental health specialists for further evaluation and treatment. When possible, include family members and significant others in education/counseling sessions.    Expected Outcomes Short Term: Participant demonstrates changes in health-related behavior, relaxation and other stress management skills, ability to obtain effective social support, and compliance with psychotropic medications if prescribed.;Long  Term: Emotional wellbeing is indicated by absence of clinically significant psychosocial distress or social isolation.    Personal Goal Other Yes    Personal Goal Short term: wants to return to hunting  and fishing. Long term: Increase stamina and strength to pick up grandkids    Intervention Will continue to monitor pt and progress workloads as tolerated without sign or symptom    Expected Outcomes Pt will achieve his goals             Core Components/Risk Factors/Patient Goals Review:   Goals and Risk Factor Review     Row Name 10/15/20 1010 11/07/20 1235 12/05/20 1715         Core Components/Risk Factors/Patient Goals Review   Personal Goals Review Weight Management/Obesity;Heart Failure;Stress;Hypertension;Lipids Weight Management/Obesity;Heart Failure;Stress;Hypertension;Lipids Weight Management/Obesity;Heart Failure;Stress;Hypertension;Lipids     Review Jeffrey Morse started exercise at cardiac rehab on 10/15/20 and did well with exercise, Vital signs stable Jeffrey Morse has been doing well with exercise at phase 2 cardiac rehab, Vital signs have been stable Jeffrey Morse has been doing well with exercise at phase 2 cardiac rehab, Vital signs have been stable Jeffrey Morse will complete cardiac rehab on 12/07/20     Expected Outcomes Jeffrey Morse will continue to participate in phase 2 cardiac rehab for exercise, nutrition and life style modifiactions. Jeffrey Morse will continue to participate in phase 2 cardiac rehab for exercise, nutrition and life style modifiactions. Jeffrey Morse will continue to  exercise,  follow nutrition and life style modifiactions upon completion of phase 2 cardiac rehab.              Core Components/Risk Factors/Patient Goals at Discharge (Final Review):   Goals and Risk Factor Review - 12/05/20 1715       Core Components/Risk Factors/Patient Goals Review   Personal Goals Review Weight Management/Obesity;Heart Failure;Stress;Hypertension;Lipids    Review Jeffrey Morse has been doing well with exercise at phase 2  cardiac rehab, Vital signs have been stable Jeffrey Morse will complete cardiac rehab on 12/07/20    Expected Outcomes Jeffrey Morse will continue to  exercise,  follow nutrition and life style modifiactions upon completion of phase 2 cardiac rehab.             ITP Comments:  ITP Comments     Row Name 10/11/20 1547 10/15/20 1006 11/07/20 1232 12/05/20 1713     ITP Comments Dr Fransico Him MD, Medical Director 30 Day ITP Review. Luismario started  cardiac rehab on 10/15/20 and did well with exercise. 30 Day ITP Review. Moussa has good attendance and partcipation in phase 2 cardiac rehab. 30 Day ITP Review. Norwood has good attendance and partcipation in phase 2 cardiac rehab. Cesare will complete cardiac rehab on 12/07/20.             Comments: See ITP comments.Jeffrey Gave RN BSN

## 2020-12-07 ENCOUNTER — Encounter (HOSPITAL_COMMUNITY)
Admission: RE | Admit: 2020-12-07 | Discharge: 2020-12-07 | Disposition: A | Discharge status: Home / Self Care | Payer: BC Managed Care – PPO | Source: Ambulatory Visit | Attending: Internal Medicine | Admitting: Internal Medicine

## 2020-12-07 ENCOUNTER — Other Ambulatory Visit: Payer: Self-pay

## 2020-12-07 DIAGNOSIS — Z955 Presence of coronary angioplasty implant and graft: Secondary | ICD-10-CM

## 2020-12-07 DIAGNOSIS — I2121 ST elevation (STEMI) myocardial infarction involving left circumflex coronary artery: Secondary | ICD-10-CM

## 2020-12-18 ENCOUNTER — Telehealth: Payer: Self-pay | Admitting: Neurology

## 2020-12-18 NOTE — Telephone Encounter (Signed)
LVM & sent mychart msg informing pt of r/s needed due to Dr. Frances Furbish being out of office.

## 2020-12-25 ENCOUNTER — Institutional Professional Consult (permissible substitution): Payer: BC Managed Care – PPO | Admitting: Neurology

## 2020-12-31 ENCOUNTER — Telehealth: Payer: Self-pay

## 2020-12-31 DIAGNOSIS — E785 Hyperlipidemia, unspecified: Secondary | ICD-10-CM

## 2020-12-31 NOTE — Telephone Encounter (Signed)
-----   Message from Olene Floss, RPH-CPP sent at 12/31/2020  2:25 PM EST -----  ----- Message ----- From: Olene Floss, RPH-CPP Sent: 12/27/2020  12:00 AM EST To: Olene Floss, RPH-CPP  Set up lipid labs

## 2020-12-31 NOTE — Telephone Encounter (Signed)
Lmom to schedule lipid labs asap

## 2021-01-08 MED ORDER — EMPAGLIFLOZIN 10 MG PO TABS: 10.0000 mg | ORAL_TABLET | Freq: Every day | ORAL | 3 refills | Status: DC

## 2021-01-08 NOTE — Addendum Note (Signed)
Addended by: Malena Peer D on: 01/08/2021 09:18 AM   Modules accepted: Orders

## 2021-01-09 ENCOUNTER — Other Ambulatory Visit: Payer: BC Managed Care – PPO | Admitting: *Deleted

## 2021-01-09 ENCOUNTER — Other Ambulatory Visit: Payer: Self-pay

## 2021-01-09 DIAGNOSIS — E785 Hyperlipidemia, unspecified: Secondary | ICD-10-CM

## 2021-01-10 ENCOUNTER — Telehealth: Payer: Self-pay | Admitting: Pharmacist

## 2021-01-10 LAB — LIPID PANEL
Chol/HDL Ratio: 2.4 ratio (ref 0.0–5.0)
Cholesterol, Total: 136 mg/dL (ref 100–199)
HDL: 57 mg/dL (ref >39)
LDL Chol Calc (NIH): 63 mg/dL (ref 0–99)
Triglycerides: 82 mg/dL (ref 0–149)
VLDL Cholesterol Cal: 16 mg/dL (ref 5–40)

## 2021-01-10 LAB — APOLIPOPROTEIN B: Apolipoprotein B: 63 mg/dL (ref <90)

## 2021-01-10 MED ORDER — PRALUENT 150 MG/ML ~~LOC~~ SOAJ: 1.0000 "pen " | SUBCUTANEOUS | 3 refills | Status: DC

## 2021-01-10 NOTE — Telephone Encounter (Signed)
LDL and ApoB well controlled on Praluent 75mg  q 14 days, ezetimibe 10mg  daily and rosuvastatin 40mg  daily. However with pt hx and young age, would prefer to maximize praluent and increase to 150mg  q 14 days. I spoke to patient who was in agreement to increase praluent to 150.

## 2021-01-14 ENCOUNTER — Encounter: Payer: Self-pay | Admitting: Neurology

## 2021-01-14 ENCOUNTER — Ambulatory Visit (INDEPENDENT_AMBULATORY_CARE_PROVIDER_SITE_OTHER): Payer: BC Managed Care – PPO | Admitting: Neurology

## 2021-01-14 VITALS — BP 126/85 | HR 63 | Ht 71.0 in | Wt 206.6 lb

## 2021-01-14 DIAGNOSIS — R0683 Snoring: Secondary | ICD-10-CM | POA: Diagnosis not present

## 2021-01-14 DIAGNOSIS — I63423 Cerebral infarction due to embolism of bilateral anterior cerebral arteries: Secondary | ICD-10-CM | POA: Diagnosis not present

## 2021-01-14 DIAGNOSIS — R519 Headache, unspecified: Secondary | ICD-10-CM

## 2021-01-14 DIAGNOSIS — Z820 Family history of epilepsy and other diseases of the nervous system: Secondary | ICD-10-CM

## 2021-01-14 DIAGNOSIS — R0681 Apnea, not elsewhere classified: Secondary | ICD-10-CM

## 2021-01-14 DIAGNOSIS — G478 Other sleep disorders: Secondary | ICD-10-CM

## 2021-01-14 DIAGNOSIS — Z955 Presence of coronary angioplasty implant and graft: Secondary | ICD-10-CM

## 2021-01-14 DIAGNOSIS — G479 Sleep disorder, unspecified: Secondary | ICD-10-CM

## 2021-01-14 NOTE — Patient Instructions (Signed)
It was nice to meet you today.  Here is what we discussed today and what we came up with as our plan for you:    Based on your symptoms and your exam I believe you are at risk for obstructive sleep apnea (aka OSA), and I think we should proceed with a sleep study to determine whether you do or do not have OSA and how severe it is. Even, if you have mild OSA, I may want you to consider treatment with CPAP, as treatment of even borderline or mild sleep apnea can result and improvement of symptoms such as sleep disruption, daytime sleepiness, nighttime bathroom breaks, restless leg symptoms, improvement of headache syndromes, even improved mood disorder.   As explained, an attended sleep study meaning you get to stay overnight in the sleep lab, lets us monitor sleep-related behaviors such as sleep talking and leg movements in sleep, in addition to monitoring for sleep apnea.  A home sleep test is a screening tool for sleep apnea only, and unfortunately does not help with any other sleep-related diagnoses.  Please remember, the long-term risks and ramifications of untreated moderate to severe obstructive sleep apnea are: increased Cardiovascular disease, including congestive heart failure, stroke, difficult to control hypertension, treatment resistant obesity, arrhythmias, especially irregular heartbeat commonly known as A. Fib. (atrial fibrillation); even type 2 diabetes has been linked to untreated OSA.   Sleep apnea can cause disruption of sleep and sleep deprivation in most cases, which, in turn, can cause recurrent headaches, problems with memory, mood, concentration, focus, and vigilance. Most people with untreated sleep apnea report excessive daytime sleepiness, which can affect their ability to drive. Please do not drive if you feel sleepy. Patients with sleep apnea can also develop difficulty initiating and maintaining sleep (aka insomnia).   Having sleep apnea may increase your risk for other sleep  disorders, including involuntary behaviors sleep such as sleep terrors, sleep talking, sleepwalking.    Having sleep apnea can also increase your risk for restless leg syndrome and leg movements at night.   Please note that untreated obstructive sleep apnea may carry additional perioperative morbidity. Patients with significant obstructive sleep apnea (typically, in the moderate to severe degree) should receive, if possible, perioperative PAP (positive airway pressure) therapy and the surgeons and particularly the anesthesiologists should be informed of the diagnosis and the severity of the sleep disordered breathing.   I will likely see you back after your sleep study to go over the test results and where to go from there. We will call you after your sleep study to advise about the results (most likely, you will hear from Megan, my nurse) and to set up an appointment at the time, as necessary.    Our sleep lab administrative assistant will call you to schedule your sleep study and give you further instructions, regarding the check in process for the sleep study, arrival time, what to bring, when you can expect to leave after the study, etc., and to answer any other logistical questions you may have. If you don't hear back from her by about 2 weeks from now, please feel free to call her direct line at [phone] or you can call our general clinic number, or email us through My Chart.    

## 2021-01-14 NOTE — Progress Notes (Signed)
Subjective:    Patient ID: Jeffrey Morse is a 65 y.o. male.  HPI    Jeffrey Foley, MD, PhD [facility] [address], [address] [address] [place], [place] [address]  Dear Shanda Bumps and Janalyn Shy,   I saw your patient, Jeffrey Morse, upon your kind request in my sleep clinic today for initial consultation of his sleep disorder, in particular, evaluation of his prior diagnosis of obstructive sleep apnea.  The patient is unaccompanied today.  As you know, Jeffrey Morse is a 65 year old right-handed gentleman with an underlying medical history of hypertension, hyperlipidemia, heart attack, status post coronary stent placement, stroke in January 2022, and overweight state, who was previously diagnosed with obstructive sleep apnea several years ago.  He is currently not on CPAP therapy.  Prior sleep study results are not available for my review today.  He reports that testing was about 10 to 12 years ago and he did not pursue CPAP therapy at the time but it was offered to him.  His son has sleep apnea.  I reviewed your office note from 11 Jun 2020.  His Epworth sleepiness score is 4/24, fatigue severity score is 25 out of 63.  He does not wake up rested.  He is a restless sleeper.  He has had back discomfort and sees a spine specialist, has had injections into the lower back.  He also had right knee surgery. Bedtime is currently between 9:30 PM and 10 PM and rise time between 8 and 8:30 AM.  He is currently not working.  He used to work in [employer].  He quit smoking some 30 years ago.  He drinks caffeine in the form of coffee, about 2 cups in the mornings, no alcohol.  He had a tonsillectomy as a child.  He lives with his wife, he has 4 grown children and 5 grandchildren.  He has no night to night nocturia but has occasionally woken up with a headache which is described as bifrontal, dull and achy.  His Past Medical History Is Significant For: Past Medical History:  Diagnosis  Date   HTN (hypertension)    Hypercholesteremia    Hypertension    Borderline hypertension   Sleep apnea    Stroke [facility])     His Past Surgical History Is Significant For: Past Surgical History:  Procedure Laterality Date   BACK SURGERY     CARDIAC CATHETERIZATION     CORONARY STENT INTERVENTION N/A 02/15/2020   Procedure: CORONARY STENT INTERVENTION;  Surgeon: Iran Ouch, MD;  Location: [facility] INVASIVE CV LAB;  Service: Cardiovascular;  Laterality: N/A;  CFX   KNEE SURGERY Left 2012   LEFT HEART CATH AND CORONARY ANGIOGRAPHY N/A 02/15/2020   Procedure: LEFT HEART CATH AND CORONARY ANGIOGRAPHY;  Surgeon: Iran Ouch, MD;  Location: [facility] INV[facility] CV [facility];  Service: Cardiovascular;  Laterality: N/A;   RIGHT HEART CATH N/A 02/15/2020   Procedure: RIGHT HEART CATH;  Surgeon: Iran Ouch, MD;  Location: [facility] CV [facility];  Service: Cardiovascular;  Laterality: N/A;   VENTRICULAR ASSIST DEVICE INSERTION N/A 02/15/2020   Procedure: VENTRICULAR ASSIST DEVICE INSERTION;  Surgeon: Iran Ouch, MD;  Location: [facility] INVASIVE CV LAB;  Service: Cardiovascular;  Laterality: N/A;  iMPELLA     His Family History Is Significant For: Family History  Problem Relation Age of Onset   Colon cancer Mother    Stroke Father     His Social History Is Significant For: Social History  Socioeconomic History   Marital status: Married    Spouse name: Not on file   Number of children: Not on file   Years of education: Not on file   Highest education level: Not on file  Occupational History   Not on file  Tobacco Use   Smoking status: Former    Types: Cigarettes   Smokeless tobacco: Never   Tobacco comments:    social smoker in high School, quit 30 yrs ago from 01-14-21  Vaping Use   Vaping Use: Never used  Substance and Sexual Activity   Alcohol use: Not Currently   Drug use: Never   Sexual activity: Yes  Other Topics Concern   Not on file  Social History Narrative   ** Merged  History Encounter **    Caffeine 2 cups in am.     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   Social Determinants of Health   Financial Resource Strain: Not on file  Food Insecurity: Not on file  Transportation Needs: Not on file  Physical Activity: Not on file  Stress: Not on file  Social Connections: Not on file    His Allergies Are:  Allergies  Allergen Reactions   Atorvastatin     Other reaction(s): back pain (10/2017)   Rosuvastatin Calcium     Other reaction(s): back pain (01/2018)  :   His Current Medications Are:  Outpatient Encounter Medications as of 01/14/2021  Medication Sig   acetaminophen (TYLENOL) 325 MG tablet Take 2 tablets (650 mg total) by mouth every 4 (four) hours as needed for mild pain (or temp > 37.5 C (99.5 F)).   Alirocumab (PRALUENT) 150 MG/ML SOAJ Inject 1 pen into the skin every 14 (fourteen) days.   aspirin EC 81 MG tablet Take 81 mg by mouth daily. Swallow whole.   carvedilol (COREG) 3.125 MG tablet Take 1 tablet (3.125 mg total) by mouth 2 (two) times daily with a meal.   clopidogrel (PLAVIX) 75 MG tablet Take 1 tablet (75 mg total) by mouth daily.   empagliflozin (JARDIANCE) 10 MG TABS tablet Take 1 tablet (10 mg total) by mouth daily.   ENTRESTO 49-51 MG TAKE 1 TABLET BY MOUTH TWICE DAILY   escitalopram (LEXAPRO) 20 MG tablet Take 20 mg by mouth daily.   Multiple Vitamin (MULTI VITAMIN) TABS Take 1 tablet by mouth daily. Centrum silver   nitroGLYCERIN (NITROSTAT) 0.4 MG SL tablet Place 1 tablet (0.4 mg total) under the tongue every 5 (five) minutes as needed for chest pain.   omeprazole (PRILOSEC) 20 MG capsule Take 1 capsule (20 mg total) by mouth daily.   rosuvastatin (CRESTOR) 40 MG tablet Take 1 tablet (40 mg total) by mouth daily.   spironolactone (ALDACTONE) 25 MG tablet Take 1 tablet (25 mg total) by mouth daily.   ezetimibe (ZETIA) 10 MG tablet Take 1 tablet (10 mg total) by mouth daily.   No facility-administered encounter  medications on file as of 01/14/2021.  :   Review of Systems:  Out of a complete 14 point review of systems, all are reviewed and negative with the exception of these symptoms as listed below:   Review of Systems  Neurological:        Snores, not a restful sleep, witnessed apnea by wife.  Had sleep study about 10 yrs ago (not sure who did, states pcp may know).  ESS 4, FSS 25.   Objective:  Neurological Exam  Physical Exam Physical Examination:   Vitals:  01/14/21 0752  BP: 126/85  Pulse: 63    General Examination: The patient is a very pleasant 65 y.o. male in no acute distress. He appears well-developed and well-nourished and well groomed.   HEENT: Normocephalic, atraumatic, pupils are equal, round and reactive to light, extraocular tracking is good without limitation to gaze excursion or nystagmus noted. Hearing is grossly intact. Face is symmetric with normal facial animation. Speech is clear with no dysarthria noted. There is no hypophonia. There is no lip, neck/head, jaw or voice tremor. Neck is supple with full range of passive and active motion. There are no carotid bruits on auscultation. Oropharynx exam reveals: mild mouth dryness, adequate dental hygiene and moderate airway crowding secondary to small airway entry and larger uvula.  Mallampati class IV.  Tonsils absent.  Tongue protrudes centrally and palate elevates symmetrically, neck circumference 15-7/8 inches.  He has mild to moderate overbite.   Chest: Clear to auscultation without wheezing, rhonchi or crackles noted.  Heart: S1+S2+0, regular and normal without murmurs, rubs or gallops noted.   Abdomen: Soft, non-tender and non-distended with normal bowel sounds appreciated on auscultation.  Extremities: There is no pitting edema in the distal lower extremities bilaterally.   Skin: Warm and dry without trophic changes noted.   Musculoskeletal: exam reveals no obvious joint deformities, tenderness or joint  swelling or erythema.   Neurologically:  Mental status: The patient is awake, alert and oriented in all 4 spheres. His immediate and remote memory, attention, language skills and fund of knowledge are appropriate. There is no evidence of aphasia, agnosia, apraxia or anomia. Speech is clear with normal prosody and enunciation. Thought process is linear. Mood is normal and affect is normal.  Cranial nerves II - XII are as described above under HEENT exam.  Motor exam: Normal bulk, strength and tone is noted. There is no tremor, Romberg is negative. Reflexes are 2+ throughout. Fine motor skills and coordination: grossly intact.  Cerebellar testing: No dysmetria or intention tremor. There is no truncal or gait ataxia.  Sensory exam: intact to light touch in the upper and lower extremities.  Gait, station and balance: He stands easily. No veering to one side is noted. No leaning to one side is noted. Posture is age-appropriate and stance is narrow based. Gait shows normal stride length and normal pace. No problems turning are noted.   Assessment and Plan:  In summary, RAMEEK GRASSER is a very pleasant 65 y.o.-year old male with an underlying medical history of hypertension, hyperlipidemia, heart attack, status post coronary stent placement, stroke in January 2022, and overweight state, whose history and physical exam are concerning for obstructive sleep apnea (OSA). I had a long chat with the patient about my findings and the diagnosis of OSA, its prognosis and treatment options. We talked about medical treatments, surgical interventions and non-pharmacological approaches. I explained in particular the risks and ramifications of untreated moderate to severe OSA, especially with respect to developing cardiovascular disease down the Road, including congestive heart failure, difficult to treat hypertension, cardiac arrhythmias, or stroke. Even type 2 diabetes has, in part, been linked to untreated OSA.  Symptoms of untreated OSA include daytime sleepiness, memory problems, mood irritability and mood disorder such as depression and anxiety, lack of energy, as well as recurrent headaches, especially morning headaches. We talked about trying to maintain a healthy lifestyle in general, as well as the importance of weight control. We also talked about the importance of good sleep hygiene. I recommended the following  at this time: sleep study.  I have outlined the differences between a laboratory attended sleep study versus home sleep test. I explained the sleep test procedure to the patient and also outlined possible surgical and non-surgical treatment options of OSA, including the use of a custom-made dental device (which would require a referral to a specialist dentist or oral surgeon), upper airway surgical options, such as traditional UPPP or a novel less invasive surgical option in the form of Inspire hypoglossal nerve stimulation (which would involve a referral to an ENT surgeon). I also explained the CPAP treatment option to the patient, who indicated that he would be willing to try CPAP if the need arises. I explained the importance of being compliant with PAP treatment, not only for insurance purposes but primarily to improve His symptoms, and for the patient's long term health benefit, including to reduce His cardiovascular risks. I answered all his questions today and the patient was in agreement. I plan to see him back after the sleep study is completed and encouraged him to call with any interim questions, concerns, problems or updates.   Thank you very much for allowing me to participate in the care of this nice patient. If I can be of any further assistance to you please do not hesitate to talk to me.   Sincerely,   Jeffrey Foley, MD, PhD

## 2021-01-16 ENCOUNTER — Ambulatory Visit (INDEPENDENT_AMBULATORY_CARE_PROVIDER_SITE_OTHER): Payer: BC Managed Care – PPO | Admitting: Neurology

## 2021-01-16 ENCOUNTER — Other Ambulatory Visit: Payer: Self-pay

## 2021-01-16 DIAGNOSIS — G478 Other sleep disorders: Secondary | ICD-10-CM

## 2021-01-16 DIAGNOSIS — G4733 Obstructive sleep apnea (adult) (pediatric): Secondary | ICD-10-CM | POA: Diagnosis not present

## 2021-01-16 DIAGNOSIS — R0681 Apnea, not elsewhere classified: Secondary | ICD-10-CM

## 2021-01-16 DIAGNOSIS — R519 Headache, unspecified: Secondary | ICD-10-CM

## 2021-01-16 DIAGNOSIS — R0683 Snoring: Secondary | ICD-10-CM

## 2021-01-16 DIAGNOSIS — Z955 Presence of coronary angioplasty implant and graft: Secondary | ICD-10-CM

## 2021-01-16 DIAGNOSIS — G479 Sleep disorder, unspecified: Secondary | ICD-10-CM

## 2021-01-16 DIAGNOSIS — G4720 Circadian rhythm sleep disorder, unspecified type: Secondary | ICD-10-CM

## 2021-01-16 DIAGNOSIS — Z820 Family history of epilepsy and other diseases of the nervous system: Secondary | ICD-10-CM

## 2021-01-16 DIAGNOSIS — I63423 Cerebral infarction due to embolism of bilateral anterior cerebral arteries: Secondary | ICD-10-CM

## 2021-01-18 NOTE — Procedures (Signed)
PATIENT'S NAME:  Jeffrey Morse, Jeffrey Morse DOB:      27-Nov-1955      MR#:    [id number]     DATE OF RECORDING: 01/16/2021 REFERRING M.D.:  Ihor Austin, NP, Dr. Geanie Logan Performed:   Baseline Polysomnogram HISTORY: 65 year old man with a history of hypertension, hyperlipidemia, heart attack, status post coronary stent placement, stroke in January 2022, and overweight state, who was previously diagnosed with obstructive sleep apnea several years ago. Testing was about 10 to 12 years ago and he did not pursue CPAP therapy at the time. His Epworth sleepiness score is 4/24. The patient's weight 206 pounds with a height of 71 (inches), resulting in a BMI of 28.7 kg/m2. The patient's neck circumference measured 16 inches.  CURRENT MEDICATIONS: Tylenol, Praluent, Aspirin, Coreg, Plavix, Jardiance, Entresto, Lexapro, MultiVitamins, Nitrostat, Prilosec, Crestor, Aldactone, Zetia   PROCEDURE:  This is a multichannel digital polysomnogram utilizing the Somnostar 11.2 system.  Electrodes and sensors were applied and monitored per AASM Specifications.   EEG, EOG, Chin and Limb EMG, were sampled at 200 Hz.  ECG, Snore and Nasal Pressure, Thermal Airflow, Respiratory Effort, CPAP Flow and Pressure, Oximetry was sampled at 50 Hz. Digital video and audio were recorded.      BASELINE STUDY  Lights Out was at 21:44 and Lights On at 04:59.  Total recording time (TRT) was 436 minutes, with a total sleep time (TST) of 365 minutes.   The patient's sleep latency was 37.5 minutes, which is delayed. REM latency was 307.5 minutes, which is markedly delayed. The sleep efficiency was 83.7 %.     SLEEP ARCHITECTURE: WASO (Wake after sleep onset) was 33.5 minutes with mild to moderate sleep fragmentation noted. There were 16 minutes in Stage N1, 278 minutes Stage N2, 53.5 minutes Stage N3 and 17.5 minutes in Stage REM.  The percentage of Stage N1 was 4.4%, Stage N2 was 76.2%, which is markedly increased, Stage N3 was 14.7% and Stage R  (REM sleep) was 4.8%, which is markedly reduced. The arousals were noted as: 51 were spontaneous, 3 were associated with PLMs, 19 were associated with respiratory events.  RESPIRATORY ANALYSIS:  There were a total of 34 respiratory events:  18 obstructive apneas, 0 central apneas and 1 mixed apneas with a total of 19 apneas and an apnea index (AI) of 3.1 /hour. There were 15 hypopneas with a hypopnea index of 2.5 /hour. The patient also had 0 respiratory event related arousals (RERAs).      The total APNEA/HYPOPNEA INDEX (AHI) was 5.6/hour and the total RESPIRATORY DISTURBANCE INDEX was  5.6 /hour.  1 events occurred in REM sleep and 29 events in NREM. The REM AHI was  3.4 /hour, versus a non-REM AHI of 5.7. The patient spent 0 minutes of total sleep time in the supine position and 365 minutes in non-supine.. The supine AHI was 0.0 versus a non-supine AHI of 5.6.  OXYGEN SATURATION & C02:  The Wake baseline 02 saturation was 94%, with the lowest being 86%. Time spent below 89% saturation equaled 12 minutes.  PERIODIC LIMB MOVEMENTS: The patient had a total of 45 Periodic Limb Movements.  The Periodic Limb Movement (PLM) index was 7.4 and the PLM Arousal index was .5/hour.  Audio and video analysis did not show any abnormal or unusual movements, behaviors, phonations or vocalizations. The patient took no bathroom breaks.  Mild intermittent snoring was noted. The EKG was in keeping with normal sinus rhythm (NSR).  Post-study, the patient indicated that sleep  was the same as usual.   IMPRESSION:  Mild Obstructive Sleep Apnea (OSA) Dysfunctions associated with sleep stages or arousal from sleep  RECOMMENDATIONS:  This sleep study shows borderline obstructive sleep apnea, with an AHI of 5.6/hour and O2 nadir of 86%. The absence of supine sleep may underestimate his sleep disordered breathing. Treatment with positive airway pressure can be considered with autoPAP, if desired by the patient. Treatment  options otherwise include weight loss and avoidance of the supine sleep position or a dental device. These different avenues will be discussed with the patient.  This study shows sleep fragmentation and abnormal sleep stage percentages; these are nonspecific findings and per se do not signify an intrinsic sleep disorder or a cause for the patient's sleep-related symptoms. Causes include (but are not limited to) the first night effect of the sleep study, circadian rhythm disturbances, medication effect or an underlying mood disorder or medical problem.  The patient should be cautioned not to drive, work at heights, or operate dangerous or heavy equipment when tired or sleepy. Review and reiteration of good sleep hygiene measures should be pursued with any patient. The patient will be advised to follow up with the referring provider, who will be notified of the test results.  I certify that I have reviewed the entire raw data recording prior to the issuance of this report in accordance with the Standards of Accreditation of the American Academy of Sleep Medicine (AASM)  Huston Foley, MD, PhD Diplomat, American Board of Neurology and Sleep Medicine (Neurology and Sleep Medicine)

## 2021-01-22 ENCOUNTER — Telehealth: Payer: Self-pay

## 2021-01-22 DIAGNOSIS — G4733 Obstructive sleep apnea (adult) (pediatric): Secondary | ICD-10-CM

## 2021-01-22 NOTE — Telephone Encounter (Signed)
-----   Message from Huston Foley, MD sent at 01/18/2021  3:45 PM EST ----- Patient referred by Dr. Pearlean Brownie and Shanda Bumps, seen by me on 01/14/21, diagnostic PSG on 01/16/21.    Please call and notify the patient that the recent sleep study showed borderline obstructive sleep apnea, with an AHI of 5.6/hour and O2 nadir of 86%. Treatment with autoPAP can be considered, if desired by the patient. Treatment options otherwise include weight loss and avoidance of the supine sleep position or a dental device. Please let him know of these different avenues and let me know how he would like to proceed.  I am not sure if insurance will cover his autoPAP or a dental device based on his mild OSA (and low sleepiness score of less than 10), but we can still try to pursue and see. The dentist will usually request insurance coverage for a dental device, if he goes that route, and DME company submits for authorization for an autoPAP, if he would like to start treatment with the machine.   Huston Foley, MD, PhD [facility])

## 2021-01-22 NOTE — Telephone Encounter (Signed)
I called patient.  I discussed his sleep study results and treatment recommendations.  Patient referred to start AutoPap.  He has met his deductible this year.  I advised him that a DME, [facility], will be reaching out to him within the next few days to discuss insurance coverage.  They will also show him how to use the machine, fit him for mask and make sure he is comfortable using the machine.  AI advised patient that he will need to use the machine for at least 4 hours each night.  A follow-up appointment was scheduled with Dr. Rexene Alberts on March 14 at 8:45 AM.  Dr. Rexene Alberts is out of the office at this time.  I spoke with Dr. Brett Fairy who gave me a verbal order for AutoPap 5 to 15 cm H2O with 2 cm EPR.

## 2021-01-29 ENCOUNTER — Encounter: Payer: Self-pay | Admitting: Neurology

## 2021-02-24 ENCOUNTER — Other Ambulatory Visit (HOSPITAL_COMMUNITY): Payer: Self-pay | Admitting: Internal Medicine

## 2021-03-04 ENCOUNTER — Institutional Professional Consult (permissible substitution): Payer: BC Managed Care – PPO | Admitting: Neurology

## 2021-03-14 ENCOUNTER — Other Ambulatory Visit (HOSPITAL_COMMUNITY): Payer: Self-pay

## 2021-03-14 MED ORDER — CARVEDILOL 3.125 MG PO TABS: 3.1250 mg | ORAL_TABLET | Freq: Two times a day (BID) | ORAL | 2 refills | Status: DC

## 2021-03-14 MED ORDER — CLOPIDOGREL BISULFATE 75 MG PO TABS: 75.0000 mg | ORAL_TABLET | Freq: Every day | ORAL | 2 refills | Status: DC

## 2021-03-14 MED ORDER — EMPAGLIFLOZIN 10 MG PO TABS: 10.0000 mg | ORAL_TABLET | Freq: Every day | ORAL | 2 refills | Status: DC

## 2021-03-14 MED ORDER — EZETIMIBE 10 MG PO TABS: 10.0000 mg | ORAL_TABLET | Freq: Every day | ORAL | 2 refills | Status: DC

## 2021-03-14 MED ORDER — ROSUVASTATIN CALCIUM 40 MG PO TABS: 40.0000 mg | ORAL_TABLET | Freq: Every day | ORAL | 2 refills | Status: DC

## 2021-03-19 DIAGNOSIS — Z0289 Encounter for other administrative examinations: Secondary | ICD-10-CM

## 2021-04-03 NOTE — Telephone Encounter (Addendum)
Received a fax from [facility] stating the patient had returned his CPAP machine.  I called the patient and LVM (ok per DPR) advising we have been updated that he had returned his machine.  Reminded him that other options were proposed for treatment for his borderline obstructive sleep apnea including weight loss, avoiding sleeping supine, and oral appliance.  I asked the patient for a call back to see what his preferences for treatment and also reminded him about his 43-month follow-up with Janett Billow NP scheduled for March 9 at 1:45 PM.  Left office number in message. ?

## 2021-04-08 ENCOUNTER — Telehealth: Payer: Self-pay

## 2021-04-08 DIAGNOSIS — E785 Hyperlipidemia, unspecified: Secondary | ICD-10-CM

## 2021-04-08 NOTE — Telephone Encounter (Signed)
Called and lmom the pt that we need to scheduled lipid/apob fasting labs.  ?

## 2021-04-08 NOTE — Telephone Encounter (Signed)
-----   Message from Olene Floss, RPH-CPP sent at 04/08/2021  4:28 PM EST ----- ? ?----- Message ----- ?From: Olene Floss, RPH-CPP ?Sent: 04/04/2021  12:00 AM EST ?To: Olene Floss, RPH-CPP ? ?Set up lipids ?----- Message ----- ?From: Olene Floss, RPH-CPP ?Sent: 01/10/2021  12:00 AM EST ?To: Olene Floss, RPH-CPP ? ?Lipids/apob ? ? ? ?

## 2021-04-10 ENCOUNTER — Other Ambulatory Visit: Payer: Self-pay

## 2021-04-10 NOTE — Telephone Encounter (Signed)
Pt called and they stated that they needed a refill for jardiance to [facility] pharmacy. The pt was unsure if they needed an appointment but I will leave that to the refill team to determine.   ?

## 2021-04-10 NOTE — Telephone Encounter (Signed)
2nd lmom to schedule appt  °

## 2021-04-11 ENCOUNTER — Ambulatory Visit: Payer: BC Managed Care – PPO | Admitting: Adult Health

## 2021-04-11 ENCOUNTER — Other Ambulatory Visit: Payer: BC Managed Care – PPO

## 2021-04-11 ENCOUNTER — Other Ambulatory Visit: Payer: Self-pay

## 2021-04-11 DIAGNOSIS — E785 Hyperlipidemia, unspecified: Secondary | ICD-10-CM

## 2021-04-11 MED ORDER — EMPAGLIFLOZIN 10 MG PO TABS: 10.0000 mg | ORAL_TABLET | Freq: Every day | ORAL | 2 refills | Status: DC

## 2021-04-12 LAB — APOLIPOPROTEIN B: Apolipoprotein B: 50 mg/dL (ref <90)

## 2021-04-12 LAB — LIPID PANEL
Chol/HDL Ratio: 1.9 ratio (ref 0.0–5.0)
Cholesterol, Total: 125 mg/dL (ref 100–199)
HDL: 67 mg/dL (ref >39)
LDL Chol Calc (NIH): 45 mg/dL (ref 0–99)
Triglycerides: 60 mg/dL (ref 0–149)
VLDL Cholesterol Cal: 13 mg/dL (ref 5–40)

## 2021-04-15 ENCOUNTER — Telehealth: Payer: Self-pay | Admitting: Pharmacist

## 2021-04-15 NOTE — Telephone Encounter (Signed)
Left VM (per DPR) that his labs look fantastic! Continue rosuvastatin 40mg  daily, zetia 10mg  daily and Praluent 150mg  every 14 days ?

## 2021-04-16 ENCOUNTER — Ambulatory Visit: Payer: BC Managed Care – PPO | Admitting: Neurology

## 2021-04-18 ENCOUNTER — Other Ambulatory Visit: Payer: BC Managed Care – PPO

## 2021-05-04 ENCOUNTER — Other Ambulatory Visit (HOSPITAL_COMMUNITY): Payer: Self-pay

## 2021-05-06 ENCOUNTER — Telehealth (HOSPITAL_COMMUNITY): Payer: Self-pay | Admitting: Pharmacy Technician

## 2021-05-06 ENCOUNTER — Other Ambulatory Visit (HOSPITAL_COMMUNITY): Payer: Self-pay

## 2021-05-06 NOTE — Telephone Encounter (Signed)
Patient Advocate Encounter ?  ?Received notification from [employer] that prior authorization for Jeffrey Morse is required. ?  ?PA submitted on CoverMyMeds ?Key  [id number] ?Status is pending ?  ?Will continue to follow. ? ?

## 2021-05-06 NOTE — Telephone Encounter (Signed)
Advanced Heart Failure Patient Advocate Encounter ? ?Prior Authorization for Sherryll Burger has been approved.   ? ?PA# [id number] ?Effective dates: 05/06/21 through 05/07/22 ? ?Archer Asa, CPhT ? ? ?

## 2021-05-08 ENCOUNTER — Encounter (HOSPITAL_COMMUNITY): Payer: Self-pay

## 2021-05-08 ENCOUNTER — Ambulatory Visit (HOSPITAL_COMMUNITY)
Admission: RE | Admit: 2021-05-08 | Discharge: 2021-05-08 | Disposition: A | Discharge status: Home / Self Care | Payer: BC Managed Care – PPO | Source: Ambulatory Visit | Attending: Family Medicine | Admitting: Family Medicine

## 2021-05-08 VITALS — BP 120/86 | HR 75 | Wt 210.4 lb

## 2021-05-08 DIAGNOSIS — E785 Hyperlipidemia, unspecified: Secondary | ICD-10-CM | POA: Diagnosis not present

## 2021-05-08 DIAGNOSIS — Z7901 Long term (current) use of anticoagulants: Secondary | ICD-10-CM | POA: Diagnosis not present

## 2021-05-08 DIAGNOSIS — E119 Type 2 diabetes mellitus without complications: Secondary | ICD-10-CM | POA: Diagnosis not present

## 2021-05-08 DIAGNOSIS — I5022 Chronic systolic (congestive) heart failure: Secondary | ICD-10-CM | POA: Insufficient documentation

## 2021-05-08 DIAGNOSIS — Z7984 Long term (current) use of oral hypoglycemic drugs: Secondary | ICD-10-CM | POA: Insufficient documentation

## 2021-05-08 DIAGNOSIS — R0602 Shortness of breath: Secondary | ICD-10-CM | POA: Insufficient documentation

## 2021-05-08 DIAGNOSIS — I2510 Atherosclerotic heart disease of native coronary artery without angina pectoris: Secondary | ICD-10-CM | POA: Diagnosis not present

## 2021-05-08 DIAGNOSIS — Z8674 Personal history of sudden cardiac arrest: Secondary | ICD-10-CM | POA: Diagnosis not present

## 2021-05-08 DIAGNOSIS — I110 Hypertensive heart disease with heart failure: Secondary | ICD-10-CM | POA: Diagnosis not present

## 2021-05-08 DIAGNOSIS — R42 Dizziness and giddiness: Secondary | ICD-10-CM | POA: Diagnosis not present

## 2021-05-08 DIAGNOSIS — G4733 Obstructive sleep apnea (adult) (pediatric): Secondary | ICD-10-CM | POA: Insufficient documentation

## 2021-05-08 DIAGNOSIS — Z8673 Personal history of transient ischemic attack (TIA), and cerebral infarction without residual deficits: Secondary | ICD-10-CM | POA: Insufficient documentation

## 2021-05-08 DIAGNOSIS — R413 Other amnesia: Secondary | ICD-10-CM

## 2021-05-08 DIAGNOSIS — Z09 Encounter for follow-up examination after completed treatment for conditions other than malignant neoplasm: Secondary | ICD-10-CM | POA: Diagnosis not present

## 2021-05-08 DIAGNOSIS — I252 Old myocardial infarction: Secondary | ICD-10-CM | POA: Diagnosis not present

## 2021-05-08 DIAGNOSIS — I951 Orthostatic hypotension: Secondary | ICD-10-CM | POA: Insufficient documentation

## 2021-05-08 DIAGNOSIS — Z79899 Other long term (current) drug therapy: Secondary | ICD-10-CM | POA: Insufficient documentation

## 2021-05-08 DIAGNOSIS — E1159 Type 2 diabetes mellitus with other circulatory complications: Secondary | ICD-10-CM

## 2021-05-08 DIAGNOSIS — Z955 Presence of coronary angioplasty implant and graft: Secondary | ICD-10-CM | POA: Insufficient documentation

## 2021-05-08 MED ORDER — ASPIRIN EC 81 MG PO TBEC: 81.0000 mg | DELAYED_RELEASE_TABLET | Freq: Every day | ORAL | 3 refills | Status: AC

## 2021-05-08 MED ORDER — ROSUVASTATIN CALCIUM 40 MG PO TABS: 40.0000 mg | ORAL_TABLET | Freq: Every day | ORAL | 3 refills | Status: DC

## 2021-05-08 MED ORDER — CLOPIDOGREL BISULFATE 75 MG PO TABS: 75.0000 mg | ORAL_TABLET | Freq: Every day | ORAL | 3 refills | Status: DC

## 2021-05-08 MED ORDER — EMPAGLIFLOZIN 10 MG PO TABS: 10.0000 mg | ORAL_TABLET | Freq: Every day | ORAL | 3 refills | Status: DC

## 2021-05-08 MED ORDER — CARVEDILOL 3.125 MG PO TABS: 3.1250 mg | ORAL_TABLET | Freq: Two times a day (BID) | ORAL | 3 refills | Status: DC

## 2021-05-08 MED ORDER — SPIRONOLACTONE 25 MG PO TABS
ORAL_TABLET | ORAL | 3 refills | Status: DC
Start: 2021-05-08 — End: 2022-08-18

## 2021-05-08 MED ORDER — ENTRESTO 49-51 MG PO TABS: 1.0000 | ORAL_TABLET | Freq: Two times a day (BID) | ORAL | 3 refills | Status: DC

## 2021-05-08 MED ORDER — NITROGLYCERIN 0.4 MG SL SUBL: 0.4000 mg | SUBLINGUAL_TABLET | SUBLINGUAL | 3 refills | Status: DC | PRN

## 2021-05-08 NOTE — Patient Instructions (Signed)
It was great to see you today! ?No medication changes are needed at this time. ? ? ?Your physician wants you to follow-up in: 6 months with Dr Gala Romney. You will receive a reminder letter in the mail two months in advance. If you don't receive a letter, please call our office to schedule the follow-up appointment. ? ? ? ?Do the following things EVERYDAY: ?Weigh yourself in the morning before breakfast. Write it down and keep it in a log. ?Take your medicines as prescribed ?Eat low salt foods--Limit salt (sodium) to 2000 mg per day.  ?Stay as active as you can everyday ?Limit all fluids for the day to less than 2 liters ? ?At the [facility], you and your health needs are our priority. As part of our continuing mission to provide you with exceptional heart care, we have created designated Provider Care Teams. These Care Teams include your primary Cardiologist (physician) and Advanced Practice Providers (APPs- Physician Assistants and Nurse Practitioners) who all work together to provide you with the care you need, when you need it.  ? ?You may see any of the following providers on your designated Care Team at your next follow up: ?Dr Arvilla Meres ?Dr Marca Ancona ?Tonye Becket, NP ?Robbie Lis, PA ?Jessica Milford,NP ?Anna Genre, PA ?Karle Plumber, PharmD ? ? ?Please be sure to bring in all your medications bottles to every appointment.  ? ? ?If you have any questions or concerns before your next appointment please send [place] a message through [place] or call our office at [phone].   ? ?TO LEAVE A MESSAGE FOR THE NURSE SELECT OPTION 2, PLEASE LEAVE A MESSAGE INCLUDING: ?YOUR NAME ?DATE OF BIRTH ?CALL BACK NUMBER ?REASON FOR CALL**this is important as we prioritize the call backs ? ?YOU WILL RECEIVE A CALL BACK THE SAME DAY AS LONG AS YOU CALL BEFORE 4:00 PM ? ?

## 2021-05-08 NOTE — Progress Notes (Signed)
? ?Advanced Heart Failure Clinic Note  ? ?PCP: Dr. Manus Gunning ?HF Cardiologist: Dr. Gala Romney ? ?Reason for Visit: F/u for chronic systolic heart failure  ? ?HPI: ?[name] is a 66 year old male with no prior cardiac history.    ? ?Presented to [facility] 02/15/2020 with chest pain where he collapsed CPR initiated. CPR was done for 15 minutes. He lost pulse again went to PEA arrest CPR resumed.  Echo with severely reduced LV systolic function no evidence of pericardial effusion.  EKG with ST elevation with lateral involvement. Emergent cardiac catheterization showed totally occluded mid LCx treated with PCI & Impella placed for cardiogenic shock. Recurrent VT after cardiac cath, defibrillated x1, maintained on amiodarone. Limited echo repeated 1/14 and EF had improved, 50-55%. RV normal.  Neurology consulted 02/17/2020 for left-sided weakness. EEG negative. 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 Maintained on aspirin and Plavix for CVA prophylaxis. Venous Doppler studies negative. He was discharged to [facility] for on-going therapy. Discharge weight was 207 lbs. ? ?Follow up 8/22, doing well, stable NYHA I-II symptoms. Echo showed EF 55%, severely decreased LV function, grade I DD, RV ok.  ? ?Today he returns for HF follow up with his wife. Overall feeling fine.He has mild SOB if he physically pushes himself too hard. Occasional positional dizziness when standing up to fast. No falls.  Denies abnormal bleeding, palpitations, CP, edema, or PND/Orthopnea. Appetite ok. No fever or chills. Weight at home stable, does not need prn Lasix. Taking all medications. Did not tolerate CPAP. Wife concerned his memory and cognitive processing seem impaired, also noticing he is more impatient. ? ? ?Cardiac Studies: ?- Echo (8/22): EF 55%, severely decreased LV function, grade I DD, RV ok,  ? ?- L/RHC (02/15/20):  PCI/DES to Left Circ, Impella ?- Mildly elevated filling pressures, mild pulmonary  hypertension and normal cardiac output.   ?RVSP: 39 mm Hg ?RVDP: 12 mmHG ?PASP: 38 mmHg ?PADP: 24 mmHg ?PA mean: 29 mmHg ?LVSP: 125 mmHg ?LVDP: 18 mmHg ?Fick CO/CI: 6.18/2.83 ? ?- Echo (02/16/20): EF ~50-55% LV small RV ok. ? ?ROS: All systems negative except as listed in HPI, PMH and Problem List. ? ?SH:  ?Social History  ? ?Socioeconomic History  ? Marital status: Married  ?  Spouse name: Not on file  ? Number of children: Not on file  ? Years of education: Not on file  ? Highest education level: Not on file  ?Occupational History  ? Not on file  ?Tobacco Use  ? Smoking status: Former  ?  Types: Cigarettes  ? Smokeless tobacco: Never  ? Tobacco comments:  ?  social smoker in high School, quit 30 yrs ago from 01-14-21  ?Vaping Use  ? Vaping Use: Never used  ?Substance and Sexual Activity  ? Alcohol use: Not Currently  ? Drug use: Never  ? Sexual activity: Yes  ?Other Topics Concern  ? Not on file  ?Social History Narrative  ? ** Merged History Encounter **   ? Caffeine 2 cups in am.    ? 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  ? ?Social Determinants of Health  ? ?Financial Resource Strain: Not on file  ?Food Insecurity: Not on file  ?Transportation Needs: Not on file  ?Physical Activity: Not on file  ?Stress: Not on file  ?Social Connections: Not on file  ?Intimate Partner Violence: Not on file  ? ?FH:  ?Family History  ?Problem Relation Age of Onset  ? Colon cancer  Mother   ? Stroke Father   ? ?Past Medical History:  ?Diagnosis Date  ? HTN (hypertension)   ? Hypercholesteremia   ? Hypertension   ? Borderline hypertension  ? Sleep apnea   ? Stroke [facility])   ? ?Current Outpatient Medications  ?Medication Sig Dispense Refill  ? acetaminophen (TYLENOL) 325 MG tablet Take 2 tablets (650 mg total) by mouth every 4 (four) hours as needed for mild pain (or temp > 37.5 C (99.5 F)).    ? Alirocumab (PRALUENT) 150 MG/ML SOAJ Inject 1 pen into the skin every 14 (fourteen) days. 6 mL 3  ? aspirin EC 81 MG tablet Take 81  mg by mouth daily. Swallow whole.    ? carvedilol (COREG) 3.125 MG tablet Take 1 tablet (3.125 mg total) by mouth 2 (two) times daily with a meal. Needs appt for further refills 60 tablet 2  ? clopidogrel (PLAVIX) 75 MG tablet Take 1 tablet (75 mg total) by mouth daily. Needs appt for further refills 30 tablet 2  ? empagliflozin (JARDIANCE) 10 MG TABS tablet Take 1 tablet (10 mg total) by mouth daily. LAST REFILL WITHOUT OFFICE VISIT PLEASE CALL [phone] TO SCHEDULE 30 tablet 2  ? ENTRESTO 49-51 MG TAKE 1 TABLET BY MOUTH TWICE DAILY 180 tablet 0  ? escitalopram (LEXAPRO) 20 MG tablet Take 20 mg by mouth daily.    ? ezetimibe (ZETIA) 10 MG tablet Take 1 tablet (10 mg total) by mouth daily. Needs appt for further refills 30 tablet 2  ? Multiple Vitamin (MULTI VITAMIN) TABS Take 1 tablet by mouth daily. Centrum silver    ? nitroGLYCERIN (NITROSTAT) 0.4 MG SL tablet Place 1 tablet (0.4 mg total) under the tongue every 5 (five) minutes as needed for chest pain. 25 tablet 3  ? omeprazole (PRILOSEC) 20 MG capsule Take 1 capsule (20 mg total) by mouth daily. 30 capsule 0  ? rosuvastatin (CRESTOR) 40 MG tablet Take 1 tablet (40 mg total) by mouth daily. Needs appt for further refills 30 tablet 2  ? spironolactone (ALDACTONE) 25 MG tablet TAKE 1 TABLET(25 MG) BY MOUTH DAILY 90 tablet 3  ? ?No current facility-administered medications for this encounter.  ? ?BP 120/86   Pulse 75   Wt 95.4 kg (210 lb 6.4 oz)   SpO2 97%   BMI 29.34 kg/m?  ? ?Wt Readings from Last 3 Encounters:  ?05/08/21 95.4 kg (210 lb 6.4 oz)  ?01/14/21 93.7 kg (206 lb 9.6 oz)  ?12/05/20 91.9 kg (202 lb 9.6 oz)  ? ?PHYSICAL EXAM: ?General:  NAD. No resp difficulty ?HEENT: Normal ?Neck: Supple. No JVD. Carotids 2+ bilat; no bruits. No lymphadenopathy or thryomegaly appreciated. ?Cor: PMI nondisplaced. Regular rate & rhythm. No rubs, gallops or murmurs. ?Lungs: Clear ?Abdomen: Soft, nontender, nondistended. No hepatosplenomegaly. No bruits or masses.  Good bowel sounds. ?Extremities: No cyanosis, clubbing, rash, edema ?Neuro: Alert & oriented x 3, cranial nerves grossly intact. Moves all 4 extremities w/o difficulty. Affect pleasant. ? ?ECG: NSR 63 bpm, LAFB (personally reviewed). ? ?ASSESSMENT & PLAN: ?1. H/O Cardiac (VT arrest in setting of acute inferolateral STEMI) ?- OOH cardiac arrest on 02/15/20 with prolonged CPR, resuscitated w/ CPR + defibrillation   ?- Emergent cath 02/15/20 w/ totally occluded mid LCx treated w/ PCI + DES ?- Recurrent VT on 1/14 w/ defib x1  ?- Now off amiodarone. No recurrent VT ?- Continue carvedilol 3.125 mg bid. ?  ?2. 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 RV normal ?- Echo (8/22): EF 55%, severe LV dysfunction, grade I DD ?- Doing well NYHA I-II. Volume looks good today, does not need daily loops. ?- Continue Entresto 49-51 mg bid. Will not increase today with occasional dizziness.   ?- Continue spironolactone 25 mg daily. ?- Continue Jardiance 10 mg daily  ?- Continue Coreg 3.125 mg bid.  ?- Recent labs at PCP look ok; SCr 0.82, K 4.6 ?  ?3. CAD s/p STEMI  ?- Emergent cath 1/12 w/ totally occluded mid LCx treated w/ PCI + DES ?- There is moderate ostial LAD disease and significant disease in the distal LAD close to the apex.  No significant disease affecting the right coronary artery. ?- No s/s ischemia. Continue medical therapy.  ?- Continue DAPT + beta blocker. ?- Completed CR. ?- Continue rosuvastatin, Zetia + Praluent (Followed by Lipid Clinic). ?  ?4. H/o CVA with ICH ?- MRI 1/22 with multiple small infarcts and small ICH (Likely cardioembolic from arrest). ?- L sided weakness resolved. ?  ?5. Orthostatic Hypotension ?- Resolved. No further symptoms ?- BP stable off midodrine ? ?6. DM2 ?- HgbA1c 6.6. ?- Continue SGLT2i ?- PCP following  ? ?7. OSA ?- Borderline with AHI 5.6/hr by sleep  study 12/22. ?- Did not tolerate CPAP. ? ?8. Memory impairment ?- ? 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 PCP for further work up. ?- With CVA history, also advised follow up with Neurology. ?- He has up-coming cognitive t

## 2021-05-09 ENCOUNTER — Encounter (HOSPITAL_COMMUNITY): Payer: BC Managed Care – PPO

## 2021-06-26 ENCOUNTER — Telehealth: Payer: Self-pay | Admitting: Pharmacist

## 2021-06-26 MED ORDER — PRALUENT 150 MG/ML ~~LOC~~ SOAJ: 1.0000 "pen " | SUBCUTANEOUS | 3 refills | Status: DC

## 2021-06-26 NOTE — Telephone Encounter (Signed)
Rx sent to [place]

## 2021-06-26 NOTE — Telephone Encounter (Signed)
-----   Message from Evon Slack, RPH-CPP sent at 06/26/2021 10:43 AM EDT ----- Jeffrey Morse,   Patient left VM that [facility] only has Praluent 75 mg and would like the Praluent 150 mg script sent there.   Thanks,  Leotis Shames

## 2021-07-30 ENCOUNTER — Other Ambulatory Visit (HOSPITAL_COMMUNITY): Payer: Self-pay | Admitting: Internal Medicine

## 2021-08-09 ENCOUNTER — Encounter: Payer: Self-pay | Admitting: Pharmacist

## 2021-08-09 ENCOUNTER — Telehealth: Payer: Self-pay | Admitting: Pharmacist

## 2021-08-09 MED ORDER — REPATHA SURECLICK 140 MG/ML ~~LOC~~ SOAJ: 1.0000 | SUBCUTANEOUS | 3 refills | Status: DC

## 2021-08-09 NOTE — Telephone Encounter (Addendum)
[place] pharmacy  Patient made aware that there is a formulary change and insurance now covers Repatha. Will submit PA.   Will send pt mychart message with copay card info when all complete.  RxBin: [id number] RxPCN: [id number] RxGrp: [id number] ID: [id number]  PA approved

## 2021-08-15 ENCOUNTER — Encounter: Payer: Self-pay | Admitting: Pharmacist

## 2021-08-15 ENCOUNTER — Other Ambulatory Visit (HOSPITAL_COMMUNITY): Payer: Self-pay | Admitting: Internal Medicine

## 2021-12-16 ENCOUNTER — Telehealth: Payer: Self-pay | Admitting: Pharmacist

## 2021-12-16 NOTE — Telephone Encounter (Signed)
Jeffrey Silvius PA renewal requested

## 2021-12-17 ENCOUNTER — Encounter: Payer: Self-pay | Admitting: Pharmacist

## 2022-02-12 ENCOUNTER — Other Ambulatory Visit (HOSPITAL_COMMUNITY): Payer: Self-pay | Admitting: Pharmacist

## 2022-02-12 MED ORDER — NITROGLYCERIN 0.4 MG SL SUBL: 0.4000 mg | SUBLINGUAL_TABLET | SUBLINGUAL | 3 refills | Status: DC | PRN

## 2022-03-06 ENCOUNTER — Ambulatory Visit (HOSPITAL_COMMUNITY)
Admission: RE | Admit: 2022-03-06 | Discharge: 2022-03-06 | Disposition: A | Discharge status: Home / Self Care | Payer: Self-pay | Source: Ambulatory Visit | Attending: Internal Medicine | Admitting: Internal Medicine

## 2022-03-06 ENCOUNTER — Encounter (HOSPITAL_COMMUNITY): Payer: Self-pay | Admitting: Internal Medicine

## 2022-03-06 VITALS — BP 100/60 | HR 68 | Wt 203.6 lb

## 2022-03-06 DIAGNOSIS — Z7984 Long term (current) use of oral hypoglycemic drugs: Secondary | ICD-10-CM | POA: Insufficient documentation

## 2022-03-06 DIAGNOSIS — I219 Acute myocardial infarction, unspecified: Secondary | ICD-10-CM | POA: Insufficient documentation

## 2022-03-06 DIAGNOSIS — I5022 Chronic systolic (congestive) heart failure: Secondary | ICD-10-CM | POA: Insufficient documentation

## 2022-03-06 DIAGNOSIS — Z8673 Personal history of transient ischemic attack (TIA), and cerebral infarction without residual deficits: Secondary | ICD-10-CM | POA: Insufficient documentation

## 2022-03-06 DIAGNOSIS — I252 Old myocardial infarction: Secondary | ICD-10-CM | POA: Insufficient documentation

## 2022-03-06 DIAGNOSIS — E119 Type 2 diabetes mellitus without complications: Secondary | ICD-10-CM | POA: Insufficient documentation

## 2022-03-06 DIAGNOSIS — I110 Hypertensive heart disease with heart failure: Secondary | ICD-10-CM | POA: Insufficient documentation

## 2022-03-06 DIAGNOSIS — I951 Orthostatic hypotension: Secondary | ICD-10-CM | POA: Insufficient documentation

## 2022-03-06 DIAGNOSIS — Z79899 Other long term (current) drug therapy: Secondary | ICD-10-CM | POA: Insufficient documentation

## 2022-03-06 DIAGNOSIS — I2510 Atherosclerotic heart disease of native coronary artery without angina pectoris: Secondary | ICD-10-CM | POA: Insufficient documentation

## 2022-03-06 DIAGNOSIS — Z955 Presence of coronary angioplasty implant and graft: Secondary | ICD-10-CM | POA: Insufficient documentation

## 2022-03-06 DIAGNOSIS — R531 Weakness: Secondary | ICD-10-CM | POA: Insufficient documentation

## 2022-03-06 DIAGNOSIS — I469 Cardiac arrest, cause unspecified: Secondary | ICD-10-CM | POA: Insufficient documentation

## 2022-03-06 LAB — BASIC METABOLIC PANEL
Anion gap: 5 (ref 5–15)
BUN: 13 mg/dL (ref 8–23)
CO2: 25 mmol/L (ref 22–32)
Calcium: 9.1 mg/dL (ref 8.9–10.3)
Chloride: 104 mmol/L (ref 98–111)
Creatinine, Ser: 0.82 mg/dL (ref 0.61–1.24)
GFR, Estimated: >60 mL/min (ref >60)
Glucose, Bld: 85 mg/dL (ref 70–99)
Potassium: 4.1 mmol/L (ref 3.5–5.1)
Sodium: 134 mmol/L — ABNORMAL LOW (ref 135–145)

## 2022-03-06 NOTE — Progress Notes (Signed)
Advanced Heart Failure Clinic Note   PCP: Dr. Marisue Humble HF Cardiologist: Dr. Haroldine Laws  HPI: Jeffrey Morse is a 67 year old male with no prior cardiac history.     Presented to [facility] 02/15/2020 with chest pain where he collapsed CPR initiated. CPR was done for 15 minutes. He lost pulse again went to PEA arrest CPR resumed.  Echo with severely reduced LV systolic function no evidence of pericardial effusion.  EKG with ST elevation with lateral involvement. Emergent cardiac catheterization showed totally occluded mid LCx treated with PCI & Impella placed for cardiogenic shock. Recurrent VT after cardiac cath, defibrillated x1, maintained on amiodarone. Limited echo repeated 1/14 and EF had improved, 50-55%. RV normal.  Neurology consulted 02/17/2020 for left-sided weakness. EEG negative. 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 Maintained on aspirin and Plavix for CVA prophylaxis. Venous Doppler studies negative. He was discharged to [facility] for on-going therapy. Discharge weight was 207 lbs.  Follow up 8/22. Echo showed EF 55%, grade I DD, RV ok.   Today he returns for HF follow up.Overall feeling fine. Denies SOB/PND/Orthopnea. No chest pain. Appetite ok. No fever or chills. Weight at home stable.  No issues walking around. Taking all medications  Cardiac Studies: - Echo (8/22): EF 55%, grade I DD, RV ok,   - L/RHC (02/15/20):  PCI/DES to Left Circ, Impella - Mildly elevated filling pressures, mild pulmonary hypertension and normal cardiac output.   RVSP: 39 mm Hg RVDP: 12 mmHG PASP: 38 mmHg PADP: 24 mmHg PA mean: 29 mmHg LVSP: 125 mmHg LVDP: 18 mmHg Fick CO/CI: 6.18/2.83  - Echo (02/16/20): EF ~50-55% LV small RV ok.  ROS: All systems negative except as listed in HPI, PMH and Problem List.  SH:  Social History   Socioeconomic History   Marital status: Married    Spouse name: Not on file   Number of children: Not on file   Years of education:  Not on file   Highest education level: Not on file  Occupational History   Not on file  Tobacco Use   Smoking status: Former    Types: Cigarettes   Smokeless tobacco: Never   Tobacco comments:    social smoker in high School, quit 30 yrs ago from 01-14-21  Vaping Use   Vaping Use: Never used  Substance and Sexual Activity   Alcohol use: Not Currently   Drug use: Never   Sexual activity: Yes  Other Topics Concern   Not on file  Social History Narrative   ** Merged History Encounter **    Caffeine 2 cups in am.     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   Social Determinants of Health   Financial Resource Strain: Not on file  Food Insecurity: Not on file  Transportation Needs: Not on file  Physical Activity: Not on file  Stress: Not on file  Social Connections: Not on file  Intimate Partner Violence: Not on file   FH:  Family History  Problem Relation Age of Onset   Colon cancer Mother    Stroke Father    Past Medical History:  Diagnosis Date   HTN (hypertension)    Hypercholesteremia    Hypertension    Borderline hypertension   Sleep apnea    Stroke [facility])    Current Outpatient Medications  Medication Sig Dispense Refill   acetaminophen (TYLENOL) 325 MG tablet Take 2 tablets (650 mg total) by mouth every 4 (four) hours as needed  for mild pain (or temp > 37.5 C (99.5 F)).     aspirin EC 81 MG tablet Take 1 tablet (81 mg total) by mouth daily. Swallow whole. 90 tablet 3   carvedilol (COREG) 3.125 MG tablet Take 1 tablet (3.125 mg total) by mouth 2 (two) times daily with a meal. Needs appt for further refills 180 tablet 3   clopidogrel (PLAVIX) 75 MG tablet Take 1 tablet (75 mg total) by mouth daily. Needs appt for further refills 90 tablet 3   empagliflozin (JARDIANCE) 10 MG TABS tablet Take 1 tablet (10 mg total) by mouth daily. 90 tablet 3   escitalopram (LEXAPRO) 20 MG tablet Take 20 mg by mouth daily.     Evolocumab (REPATHA SURECLICK) 151 MG/ML SOAJ  Inject 1 Pen into the skin every 14 (fourteen) days. 6 mL 3   ezetimibe (ZETIA) 10 MG tablet Take 1 tablet (10 mg total) by mouth daily. 90 tablet 3   Multiple Vitamin (MULTI VITAMIN) TABS Take 1 tablet by mouth daily. Centrum silver     nitroGLYCERIN (NITROSTAT) 0.4 MG SL tablet Place 1 tablet (0.4 mg total) under the tongue every 5 (five) minutes as needed for chest pain. 25 tablet 3   omeprazole (PRILOSEC) 20 MG capsule Take 1 capsule (20 mg total) by mouth daily. 30 capsule 0   rosuvastatin (CRESTOR) 40 MG tablet Take 1 tablet (40 mg total) by mouth daily. Needs appt for further refills 90 tablet 3   sacubitril-valsartan (ENTRESTO) 49-51 MG Take 1 tablet by mouth 2 (two) times daily. 180 tablet 3   spironolactone (ALDACTONE) 25 MG tablet TAKE 1 TABLET(25 MG) BY MOUTH DAILY 90 tablet 3   No current facility-administered medications for this encounter.   BP 100/60   Pulse 68   Wt 92.4 kg (203 lb 9.6 oz)   SpO2 95%   BMI 28.40 kg/m   Wt Readings from Last 3 Encounters:  03/06/22 92.4 kg (203 lb 9.6 oz)  05/08/21 95.4 kg (210 lb 6.4 oz)  01/14/21 93.7 kg (206 lb 9.6 oz)   PHYSICAL EXAM: General:  Well appearing. No resp difficulty HEENT: normal Neck: supple. no JVD. Carotids 2+ bilat; no bruits. No lymphadenopathy or thryomegaly appreciated. Cor: PMI nondisplaced. Regular rate & rhythm. No rubs, gallops or murmurs. Lungs: clear Abdomen: soft, nontender, nondistended. No hepatosplenomegaly. No bruits or masses. Good bowel sounds. Extremities: no cyanosis, clubbing, rash, edema Neuro: alert & orientedx3, cranial nerves grossly intact. moves all 4 extremities w/o difficulty. Affect pleasant  EKG: SR 66 bpm   ASSESSMENT & PLAN: 1. H/O Cardiac (VT arrest in setting of acute inferolateral STEMI) - OOH cardiac arrest on 02/15/20 with prolonged CPR, resuscitated w/ CPR + defibrillation   - Emergent cath 02/15/20 w/ totally occluded mid LCx treated w/ PCI + DES - Recurrent VT on 1/14 w/  defib x1  - Now off amiodarone. No recurrent VT - Continue carvedilol 3.125 mg bid.   2. 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 RV normal - Echo (8/22): EF 55%, severe LV dysfunction, grade I DD - NYHA I . Volume status stable.  - Set up repeat ECHO  - Continue coreg 3.125 mg twice a day.  - Continue Entresto 49-51 mg bid.  - Continue spironolactone 25 mg daily. - Continue Jardiance 10 mg daily  - Check BMET  3. CAD s/p STEMI  - Emergent cath 1/12 w/ totally occluded mid LCx treated w/ PCI + DES - There is moderate ostial LAD disease and significant disease in the distal LAD close to the apex.  No significant disease affecting the right coronary artery. - No chest pain. .  - Continue DAPT + beta blocker. - Continue rosuvastatin, Zetia + Praluent (Followed by [facility]).   4. H/o CVA with ICH - MRI 1/22 with multiple small infarcts and small ICH (Likely cardioembolic from arrest). - L sided weakness resolved.   5. Orthostatic Hypotension - Resolved.   6. DM2 - HgbA1c 6.6. - Continue SGLT2i - PCP following   7. OSA - Borderline with AHI 5.6/hr by sleep study 12/22. - Did not tolerate CPAP.  8. Memory impairment - Improved  Check BMET   Set up repeat ECHO   Follow up with Dr Vaughan Browner in 1 year   [place], Jeffrey Morse  03/06/22  Patient seen and examined with the above-signed Advanced Practice Provider and/or Housestaff. I personally reviewed laboratory data, imaging studies and relevant notes. I independently examined the patient and formulated the important aspects of the plan. I have edited the note to reflect any of my changes or salient points. I have personally discussed the plan with the patient and/or family.   Doing great. NYHA I. No CP or HF symptoms. Last echo 8/22 EF 55%   General:  Well appearing. No resp  difficulty HEENT: normal Neck: supple. no JVD. Carotids 2+ bilat; no bruits. No lymphadenopathy or thryomegaly appreciated. Cor: PMI nondisplaced. Regular rate & rhythm. No rubs, gallops or murmurs. Lungs: clear Abdomen: soft, nontender, nondistended. No hepatosplenomegaly. No bruits or masses. Good bowel sounds. Extremities: no cyanosis, clubbing, rash, edema Neuro: alert & orientedx3, cranial nerves grossly intact. moves all 4 extremities w/o difficulty. Affect pleasant  Doing great. NYHA I. No ischemic symptoms. Repeat echo and labs. F/u 1 year.  Glori Bickers, MD  7:27 PM

## 2022-03-06 NOTE — Patient Instructions (Signed)
There has been no changes to your medications.  Labs done today, your results will be available in MyChart, we will contact you for abnormal readings.  Your physician has requested that you have an echocardiogram. Echocardiography is a painless test that uses sound waves to create images of your heart. It provides your doctor with information about the size and shape of your heart and how well your heart's chambers and valves are working. This procedure takes approximately one hour. There are no restrictions for this procedure. Please do NOT wear cologne, perfume, aftershave, or lotions (deodorant is allowed). Please arrive 15 minutes prior to your appointment time.  Your physician recommends that you schedule a follow-up appointment in: 1 year ( February 2025)  ** please call the office in November to arrange your follow up appointment. **   If you have any questions or concerns before your next appointment please send [place] a message through [place] or call our office at [phone].    TO LEAVE A MESSAGE FOR THE NURSE SELECT OPTION 2, PLEASE LEAVE A MESSAGE INCLUDING: YOUR NAME DATE OF BIRTH CALL BACK NUMBER REASON FOR CALL**this is important as we prioritize the call backs  YOU WILL RECEIVE A CALL BACK THE SAME DAY AS LONG AS YOU CALL BEFORE 4:00 PM  At the [facility], you and your health needs are our priority. As part of our continuing mission to provide you with exceptional heart care, we have created designated Provider Care Teams. These Care Teams include your primary Cardiologist (physician) and Advanced Practice Providers (APPs- Physician Assistants and Nurse Practitioners) who all work together to provide you with the care you need, when you need it.   You may see any of the following providers on your designated Care Team at your next follow up: Dr Glori Bickers Dr Loralie Champagne Dr. Roxana Hires, NP Lyda Jester, [place] [name] [place], [place] Forestine Na, NP Audry Riles, PharmD   Please be sure to bring in all your medications bottles to every appointment.    Thank you for choosing [facility]

## 2022-03-20 ENCOUNTER — Ambulatory Visit (HOSPITAL_COMMUNITY)
Admission: RE | Admit: 2022-03-20 | Discharge: 2022-03-20 | Disposition: A | Discharge status: Home / Self Care | Payer: Medicare PPO | Source: Ambulatory Visit | Attending: Family Medicine | Admitting: Family Medicine

## 2022-03-20 DIAGNOSIS — E785 Hyperlipidemia, unspecified: Secondary | ICD-10-CM | POA: Diagnosis not present

## 2022-03-20 DIAGNOSIS — G4730 Sleep apnea, unspecified: Secondary | ICD-10-CM | POA: Diagnosis not present

## 2022-03-20 DIAGNOSIS — I509 Heart failure, unspecified: Secondary | ICD-10-CM | POA: Insufficient documentation

## 2022-03-20 DIAGNOSIS — I252 Old myocardial infarction: Secondary | ICD-10-CM | POA: Diagnosis not present

## 2022-03-20 DIAGNOSIS — I5022 Chronic systolic (congestive) heart failure: Secondary | ICD-10-CM | POA: Diagnosis not present

## 2022-03-20 LAB — ECHOCARDIOGRAM COMPLETE
AR max vel: 3.06 cm2
AV Area VTI: 2.89 cm2
AV Area mean vel: 2.92 cm2
AV Mean grad: 3 mmHg
AV Peak grad: 5.4 mmHg
Ao pk vel: 1.16 m/s
Area-P 1/2: 2.69 cm2
S' Lateral: 2.1 cm

## 2022-03-20 NOTE — Progress Notes (Signed)
Echocardiogram 2D Echocardiogram has been performed.  Jeffrey Morse 03/20/2022, 9:50 AM

## 2022-05-01 ENCOUNTER — Other Ambulatory Visit (HOSPITAL_COMMUNITY): Payer: Self-pay | Admitting: Family Medicine

## 2022-05-01 ENCOUNTER — Other Ambulatory Visit (HOSPITAL_COMMUNITY): Payer: Self-pay | Admitting: Internal Medicine

## 2022-05-16 ENCOUNTER — Other Ambulatory Visit (HOSPITAL_COMMUNITY): Payer: Self-pay | Admitting: Cardiology

## 2022-05-20 ENCOUNTER — Other Ambulatory Visit (HOSPITAL_COMMUNITY): Payer: Self-pay | Admitting: Internal Medicine

## 2022-05-27 IMAGING — DX DG CHEST 1V PORT
1 series · 1 of 1 positions shown · non-contrast
Comparison: Portable chest 02/16/2020 and earlier.

CLINICAL DATA: 64-year-old male intubated. Cardiac arrest,
cardiogenic shock. Multiple small acute cerebral infarcts.

EXAM:
PORTABLE CHEST 1 VIEW

[chest ap]
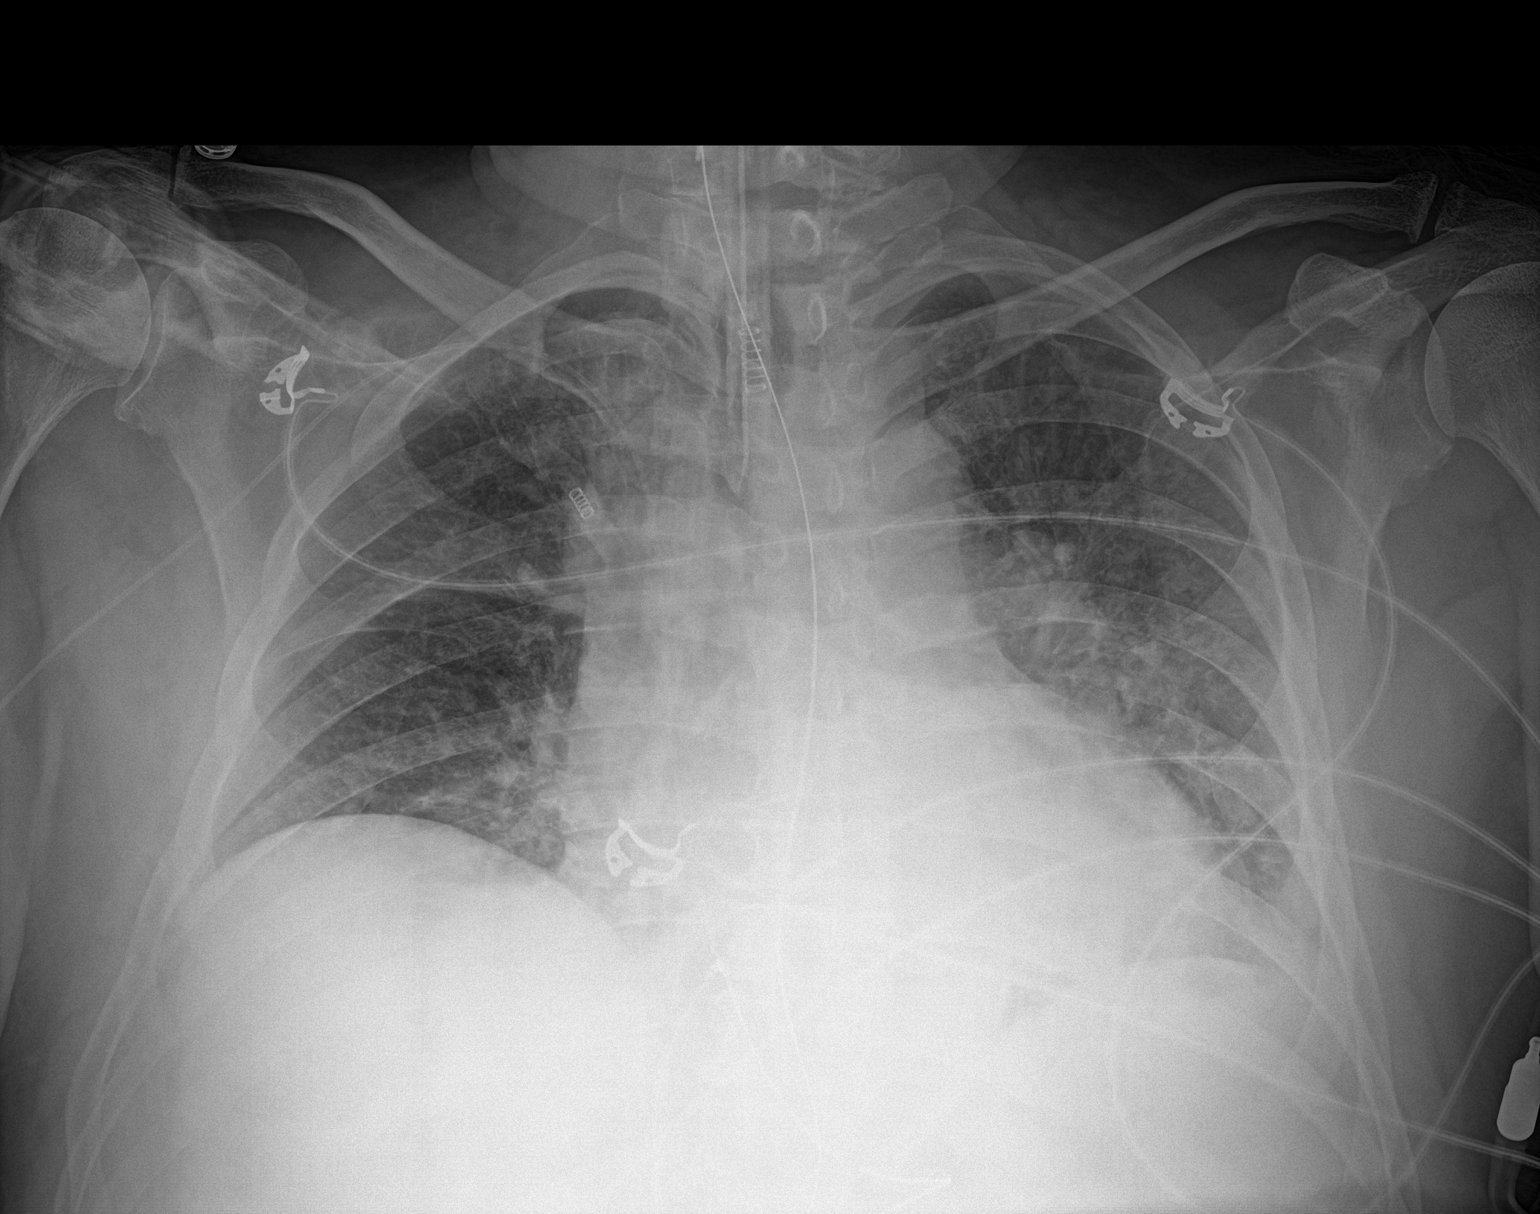

[1 of 1 positions shown; findings below may reference images not displayed]

FINDINGS: Portable AP semi upright view at 6823 hours. Endotracheal tube tip
in good position between the clavicles and carina. Enteric tube
courses to the abdomen, tip not included. Right IJ Swan-Ganz
catheter removed. New right PICC line in place, PICC line tip
difficult to delineate.

Lower lung volumes. Mild accentuation of cardiac size. Other
mediastinal contours are within normal limits.

Increased interstitial markings in both lungs with perihilar
crowding of markings, mild lung base hypo ventilation. No
pneumothorax or pleural effusion. Paucity of bowel gas in the
visible upper abdomen. Stable visualized osseous structures.
IMPRESSION: 1. Right IJ Swan-Ganz catheter removed. New right PICC line in
place. Satisfactory ET tube and visible enteric tube.
2. Lower lung volumes with diffuse atelectasis suspected. Mild
interstitial edema or viral/atypical respiratory infection difficult
to exclude.

## 2022-05-27 IMAGING — DX DG ABDOMEN 1V
1 series · 1 of 1 positions shown · non-contrast
Comparison: None.

CLINICAL DATA: Orogastric tube placement.

EXAM:
ABDOMEN - 1 VIEW

[abdomen kub]
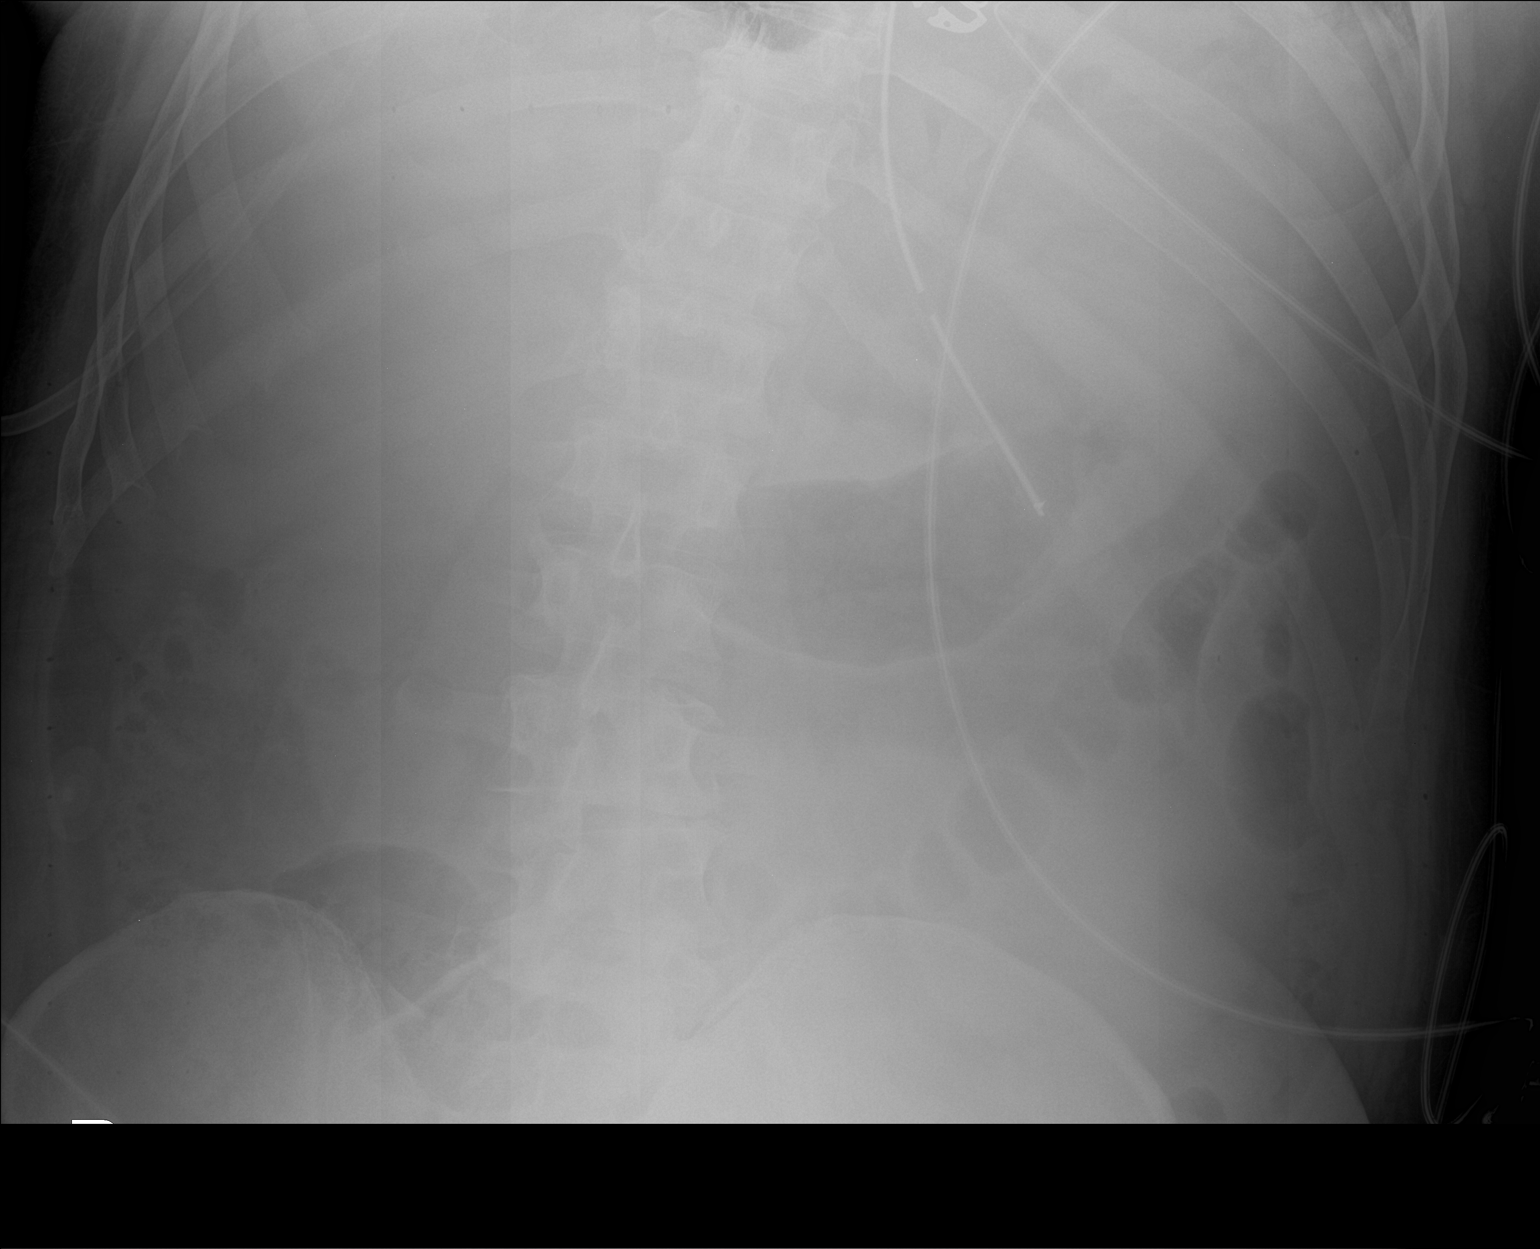

[1 of 1 positions shown; findings below may reference images not displayed]

FINDINGS: The bowel gas pattern is normal. Distal tip of enteric tube is seen
in the stomach in grossly good position. No radio-opaque calculi or
other significant radiographic abnormality are seen.
IMPRESSION: Distal tip of enteric tube seen in the stomach in grossly good
position.

## 2022-05-27 IMAGING — DX DG ABDOMEN 1V
1 series · 1 of 1 positions shown · non-contrast
Comparison: None.

CLINICAL DATA: Feeding tube placement.

EXAM:
ABDOMEN - 1 VIEW

[abdomen]
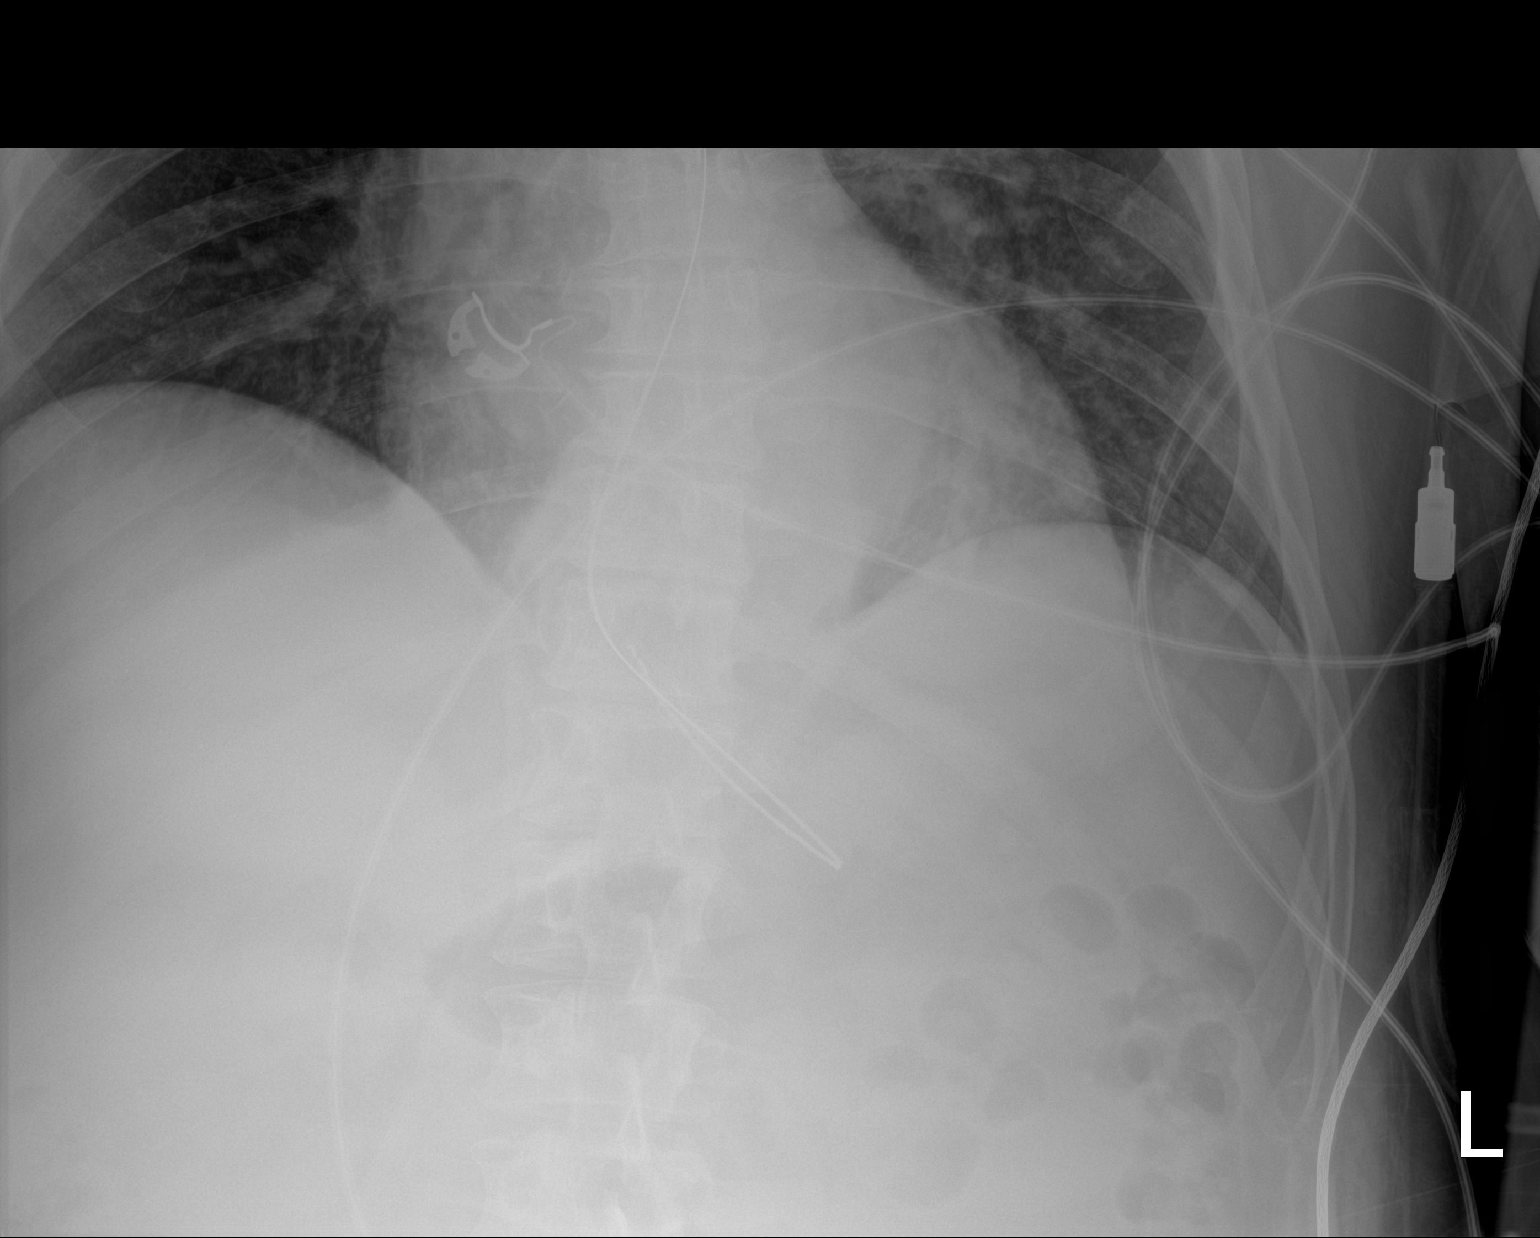

[1 of 1 positions shown; findings below may reference images not displayed]

FINDINGS: The bowel gas pattern is normal. Distal portion of nasogastric tube
appears to be looped in the distal esophagus, with the distal tip in
the distal esophagus. No radio-opaque calculi or other significant
radiographic abnormality are seen.
IMPRESSION: Distal portion of nasogastric tube appears to be looped in the
distal esophagus, with the distal tip in the distal esophagus.

## 2022-05-29 IMAGING — DX DG CHEST 1V PORT
1 series · 1 of 1 positions shown · non-contrast
Comparison: 02/20/2020.

CLINICAL DATA: Intubation.  STEMI.  Cardiac arrest.

EXAM:
PORTABLE CHEST 1 VIEW

[chest ap]
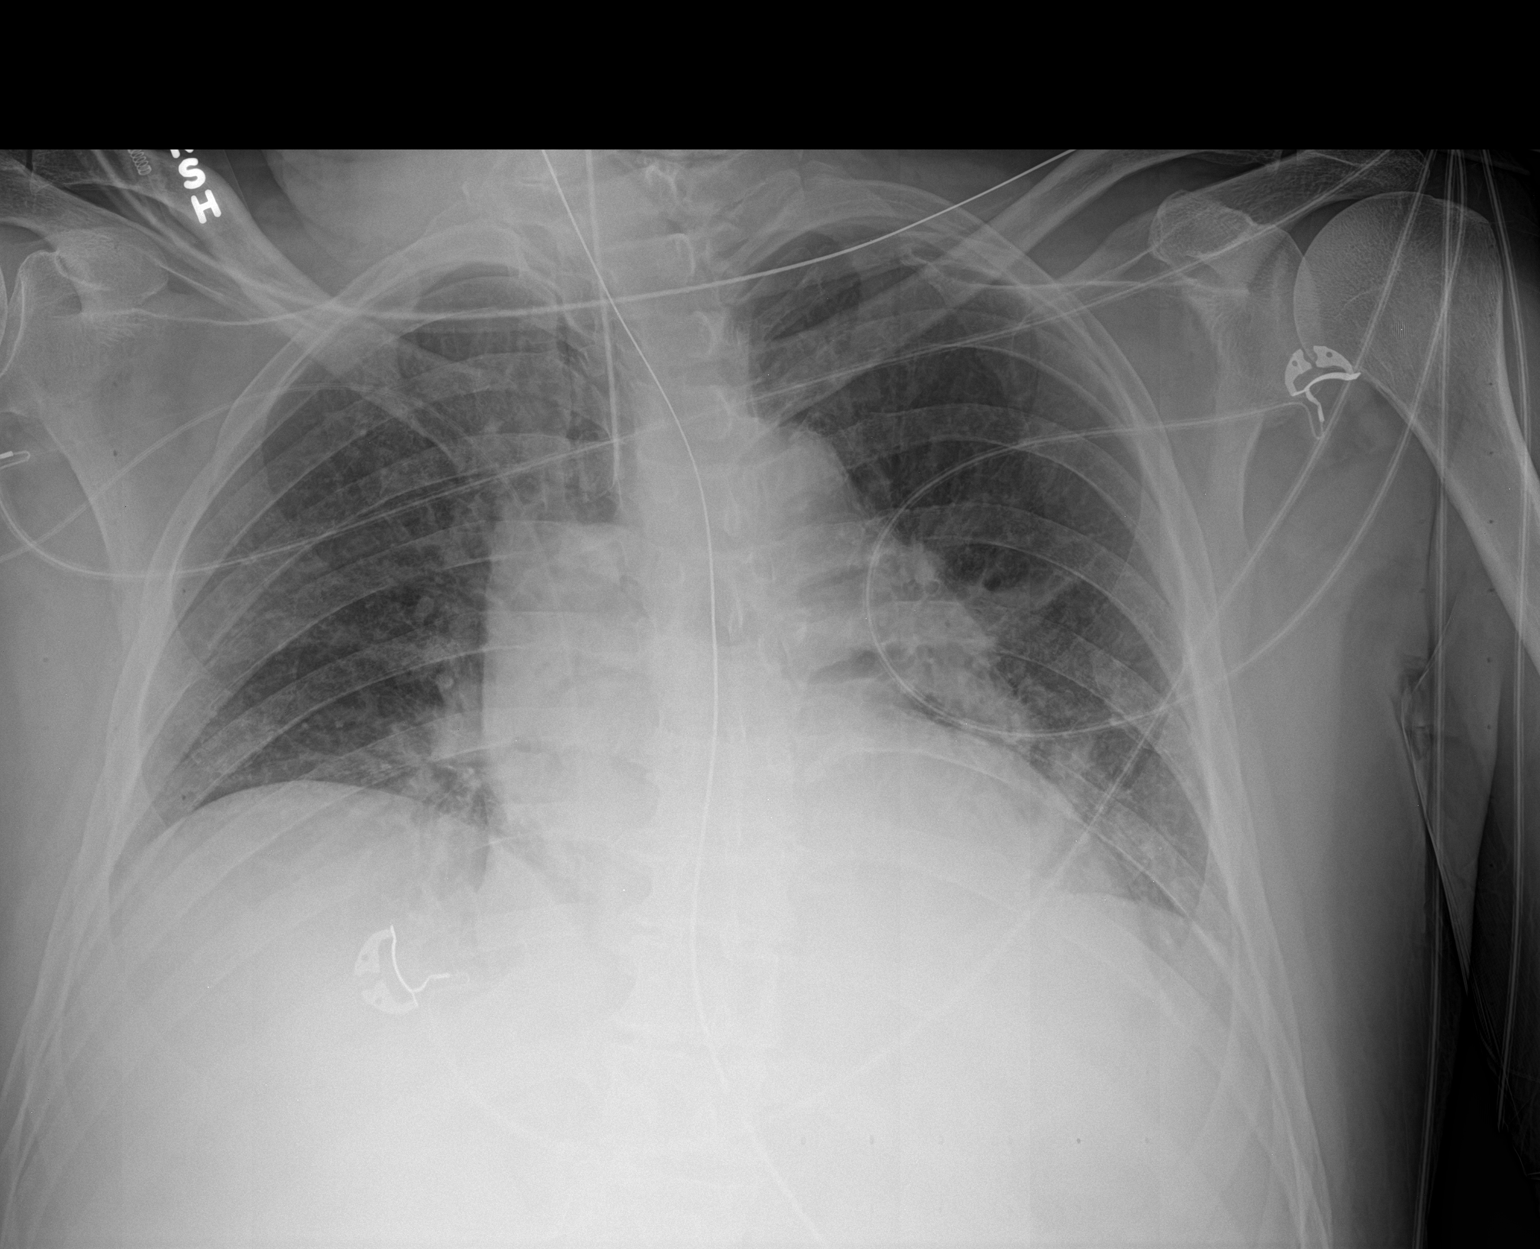

[1 of 1 positions shown; findings below may reference images not displayed]

FINDINGS: Right PICC line noted with tip over lower right atrium, proximal
repositioning of approximately 8 cm should be considered. Stable
cardiomegaly. Low lung volumes with persistent bibasilar
atelectasis/infiltrates. No pleural effusion or pneumothorax.
Endotracheal tube and NG tube in stable position.
IMPRESSION: 1. Right PICC line noted with tip over lower right atrium, proximal
repositioning of approximately 8 cm should be considered.
2. Endotracheal tube and NG tube in stable position. Stable
cardiomegaly.
3. Persistent bibasilar atelectasis/infiltrates.

These results will be called to the ordering clinician or
representative by the Radiologist Assistant, and communication
documented in the PACS or [REDACTED].

## 2022-06-03 IMAGING — DX DG CHEST 1V PORT
1 series · 1 of 1 positions shown · non-contrast
Comparison: Chest radiograph 02/21/2020

CLINICAL DATA: Shortness of breath

EXAM:
PORTABLE CHEST 1 VIEW

[chest ap]
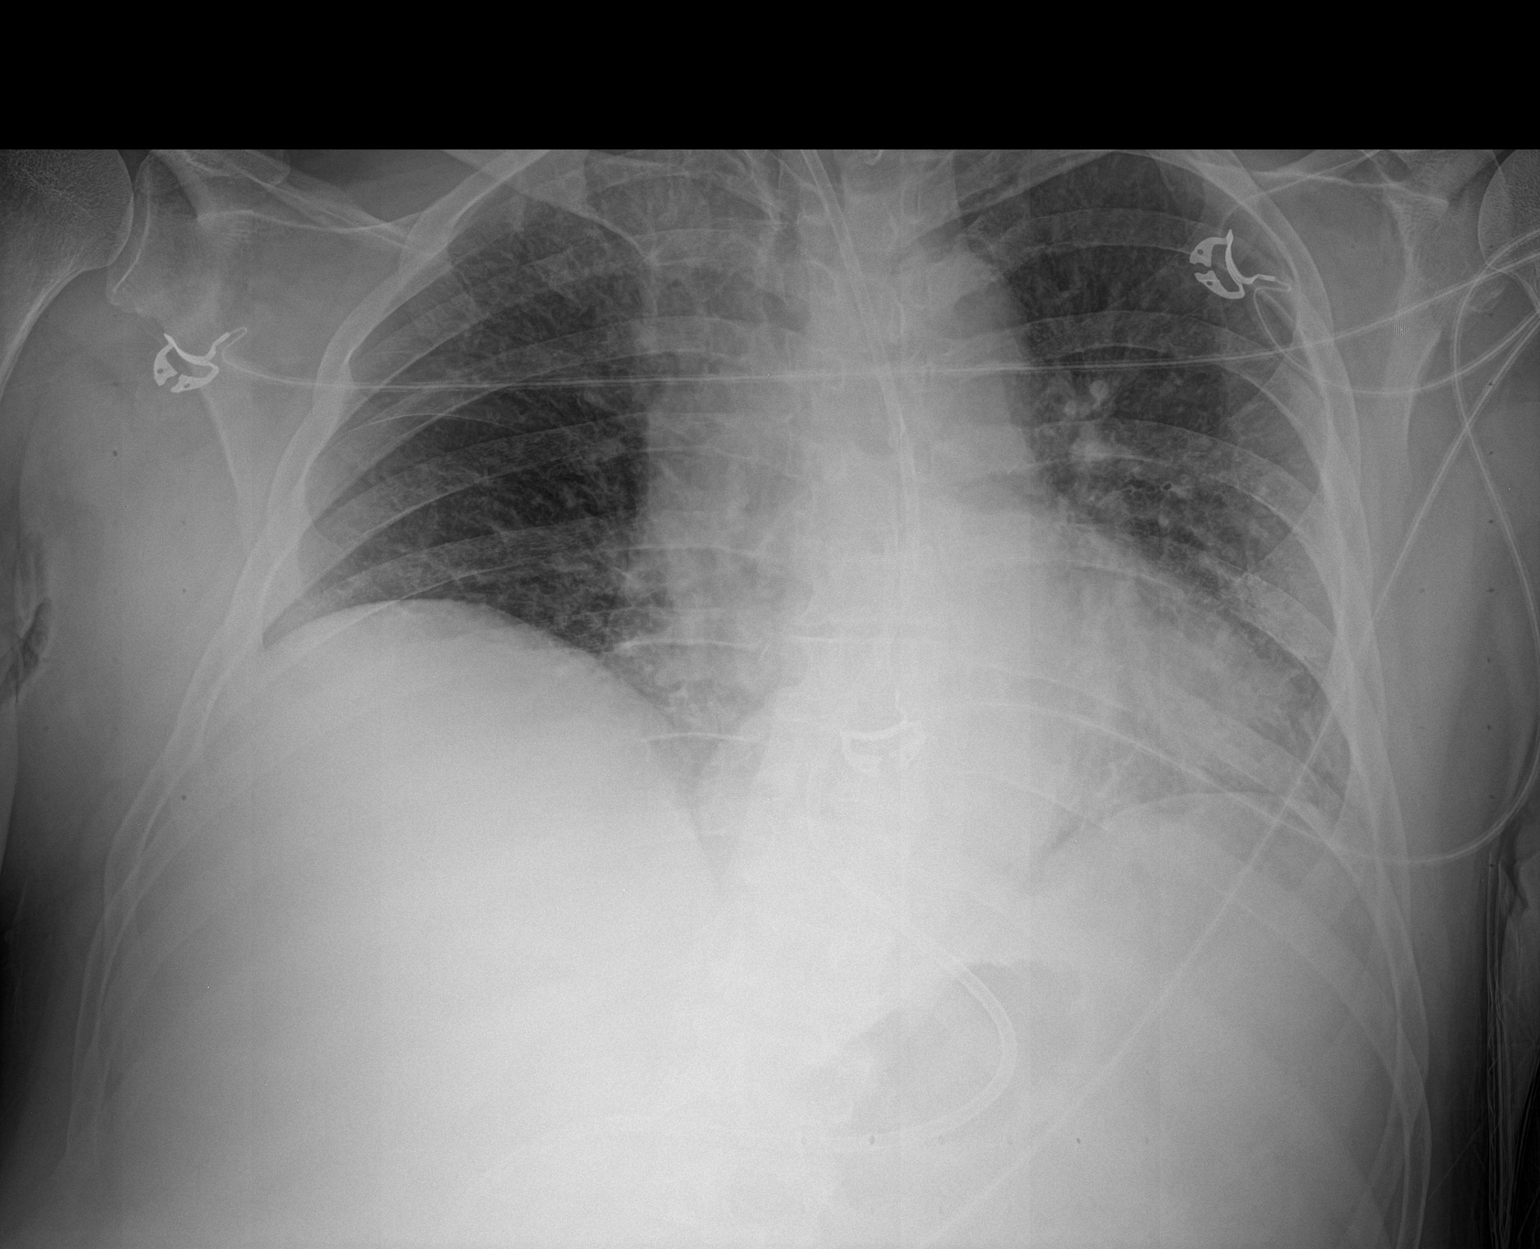

[1 of 1 positions shown; findings below may reference images not displayed]

FINDINGS: Interval extubation. Enteric tube courses inferior to the diaphragm.
Interval removal right upper extremity PICC line. Stable cardiac and
mediastinal contours with low lung volumes elevation right
hemidiaphragm. Similar mid and lower lung airspace opacities. No
pleural effusion or pneumothorax.
IMPRESSION: 1. Interval extubation.
2. Similar mid and lower lung airspace opacities.

## 2022-08-06 ENCOUNTER — Other Ambulatory Visit (HOSPITAL_COMMUNITY): Payer: Self-pay | Admitting: Family Medicine

## 2022-08-18 ENCOUNTER — Other Ambulatory Visit (HOSPITAL_COMMUNITY): Payer: Self-pay | Admitting: Family Medicine

## 2022-10-08 ENCOUNTER — Other Ambulatory Visit: Payer: Self-pay | Admitting: Pharmacist

## 2022-10-08 MED ORDER — REPATHA SURECLICK 140 MG/ML ~~LOC~~ SOAJ
140.0000 mg | SUBCUTANEOUS | 3 refills | Status: DC
Start: 2022-10-08 — End: 2023-09-21

## 2022-11-03 ENCOUNTER — Other Ambulatory Visit (HOSPITAL_COMMUNITY): Payer: Self-pay | Admitting: Family Medicine

## 2022-11-13 DIAGNOSIS — R7303 Prediabetes: Secondary | ICD-10-CM | POA: Diagnosis not present

## 2022-11-13 DIAGNOSIS — E7800 Pure hypercholesterolemia, unspecified: Secondary | ICD-10-CM | POA: Diagnosis not present

## 2023-01-29 ENCOUNTER — Other Ambulatory Visit (HOSPITAL_COMMUNITY): Payer: Self-pay | Admitting: Family Medicine

## 2023-02-06 ENCOUNTER — Other Ambulatory Visit (HOSPITAL_COMMUNITY): Payer: Self-pay | Admitting: Family Medicine

## 2023-02-20 ENCOUNTER — Other Ambulatory Visit (HOSPITAL_COMMUNITY): Payer: Self-pay | Admitting: Cardiology

## 2023-02-20 MED ORDER — NITROGLYCERIN 0.4 MG SL SUBL
0.4000 mg | SUBLINGUAL_TABLET | SUBLINGUAL | 3 refills | Status: AC | PRN
Start: 2023-02-20 — End: 2023-10-23

## 2023-02-20 MED ORDER — NITROGLYCERIN 0.4 MG SL SUBL: 0.4000 mg | SUBLINGUAL_TABLET | SUBLINGUAL | 3 refills | Status: DC | PRN

## 2023-02-20 NOTE — Addendum Note (Signed)
Addended by: Theresia Bough on: 02/20/2023 10:12 AM   Modules accepted: Orders

## 2023-03-18 ENCOUNTER — Telehealth (HOSPITAL_COMMUNITY): Payer: Self-pay | Admitting: Internal Medicine

## 2023-04-10 ENCOUNTER — Other Ambulatory Visit (HOSPITAL_COMMUNITY): Payer: Self-pay | Admitting: Family Medicine

## 2023-04-13 ENCOUNTER — Telehealth: Payer: Self-pay | Admitting: Internal Medicine

## 2023-04-13 NOTE — Telephone Encounter (Signed)
 Lvm to confirm appt on 04/14/23

## 2023-04-14 ENCOUNTER — Encounter: Payer: Self-pay | Admitting: Internal Medicine

## 2023-04-14 ENCOUNTER — Ambulatory Visit: Payer: Medicare PPO | Attending: Internal Medicine | Admitting: Internal Medicine

## 2023-04-14 VITALS — BP 116/79 | HR 68 | Wt 212.8 lb

## 2023-04-14 DIAGNOSIS — I5022 Chronic systolic (congestive) heart failure: Secondary | ICD-10-CM | POA: Insufficient documentation

## 2023-04-14 DIAGNOSIS — G4733 Obstructive sleep apnea (adult) (pediatric): Secondary | ICD-10-CM | POA: Insufficient documentation

## 2023-04-14 DIAGNOSIS — E119 Type 2 diabetes mellitus without complications: Secondary | ICD-10-CM | POA: Diagnosis not present

## 2023-04-14 DIAGNOSIS — I252 Old myocardial infarction: Secondary | ICD-10-CM | POA: Insufficient documentation

## 2023-04-14 DIAGNOSIS — Z8674 Personal history of sudden cardiac arrest: Secondary | ICD-10-CM | POA: Insufficient documentation

## 2023-04-14 DIAGNOSIS — Z7902 Long term (current) use of antithrombotics/antiplatelets: Secondary | ICD-10-CM | POA: Insufficient documentation

## 2023-04-14 DIAGNOSIS — I2510 Atherosclerotic heart disease of native coronary artery without angina pectoris: Secondary | ICD-10-CM | POA: Diagnosis not present

## 2023-04-14 DIAGNOSIS — I110 Hypertensive heart disease with heart failure: Secondary | ICD-10-CM | POA: Diagnosis not present

## 2023-04-14 DIAGNOSIS — E785 Hyperlipidemia, unspecified: Secondary | ICD-10-CM | POA: Insufficient documentation

## 2023-04-14 NOTE — Progress Notes (Signed)
 Advanced Heart Failure Clinic Note   PCP: Dr. Manus Gunning HF Cardiologist: Dr. Gala Romney  HPI: [name] is a 68 year old male with no prior cardiac history.     Presented to [facility] 02/15/2020 with chest pain where he collapsed CPR initiated. CPR was done for 15 minutes. He lost pulse again went to PEA arrest CPR resumed.  Echo with severely reduced LV systolic function no evidence of pericardial effusion.  EKG with ST elevation with lateral involvement. Emergent cardiac catheterization showed totally occluded mid LCx treated with PCI & Impella placed for cardiogenic shock. Recurrent VT after cardiac cath, defibrillated x1, maintained on amiodarone. Limited echo repeated 1/14 and EF had improved, 50-55%. RV normal.  Neurology consulted 02/17/2020 for left-sided weakness. EEG negative. 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 Maintained on aspirin and Plavix for CVA prophylaxis. Venous Doppler studies negative. He was discharged to [facility] for on-going therapy. Discharge weight was 207 lbs.  Follow up 8/22. Echo EF 55%, grade I DD, RV ok.   Echo 2/24 EF 50-55% RV ok Personally reviewed  Today he returns for HF follow up with his wife. Feels great. Denies CP or SOB.   Cardiac Studies: - Echo (8/22): EF 55%, grade I DD, RV ok,   - L/RHC (02/15/20):  PCI/DES to Left Circ, Impella - Mildly elevated filling pressures, mild pulmonary hypertension and normal cardiac output.   RVSP: 39 mm Hg RVDP: 12 mmHG PASP: 38 mmHg PADP: 24 mmHg PA mean: 29 mmHg LVSP: 125 mmHg LVDP: 18 mmHg Fick CO/CI: 6.18/2.83  - Echo (02/16/20): EF ~50-55% LV small RV ok.  ROS: All systems negative except as listed in HPI, PMH and Problem List.  SH:  Social History   Socioeconomic History   Marital status: Married    Spouse name: Not on file   Number of children: Not on file   Years of education: Not on file   Highest education level: Not on file  Occupational History   Not  on file  Tobacco Use   Smoking status: Former    Types: Cigarettes   Smokeless tobacco: Never   Tobacco comments:    social smoker in high School, quit 30 yrs ago from 01-14-21  Vaping Use   Vaping status: Never Used  Substance and Sexual Activity   Alcohol use: Not Currently   Drug use: Never   Sexual activity: Yes  Other Topics Concern   Not on file  Social History Narrative   ** Merged History Encounter **    Caffeine 2 cups in am.     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   Social Drivers of Corporate investment banker Strain: Not on file  Food Insecurity: Not on file  Transportation Needs: Not on file  Physical Activity: Not on file  Stress: Not on file  Social Connections: Not on file  Intimate Partner Violence: Not on file   FH:  Family History  Problem Relation Age of Onset   Colon cancer Mother    Stroke Father    Past Medical History:  Diagnosis Date   HTN (hypertension)    Hypercholesteremia    Hypertension    Borderline hypertension   Sleep apnea    Stroke [facility])    Current Outpatient Medications  Medication Sig Dispense Refill   acetaminophen (TYLENOL) 325 MG tablet Take 2 tablets (650 mg total) by mouth every 4 (four) hours as needed for mild pain (or temp > 37.5  C (99.5 F)).     aspirin EC 81 MG tablet Take 1 tablet (81 mg total) by mouth daily. Swallow whole. 90 tablet 3   carvedilol (COREG) 3.125 MG tablet Take 1 tablet (3.125 mg total) by mouth 2 (two) times daily with a meal. 180 tablet 1   clopidogrel (PLAVIX) 75 MG tablet TAKE ONE TABLET BY MOUTH DAILY. NEEDS APPT FOR FURTHER REFILLS 90 tablet 3   ENTRESTO 49-51 MG TAKE ONE TABLET BY MOUTH TWICE A DAY 180 tablet 3   escitalopram (LEXAPRO) 20 MG tablet Take 20 mg by mouth daily.     Evolocumab (REPATHA SURECLICK) 140 MG/ML SOAJ Inject 140 mg into the skin every 14 (fourteen) days. 6 mL 3   ezetimibe (ZETIA) 10 MG tablet TAKE ONE TABLET BY MOUTH ONCE A DAY (NEED APPT FOR REFILLS) 90  tablet 3   JARDIANCE 10 MG TABS tablet TAKE ONE TABLET BY MOUTH DAILY 90 tablet 3   Multiple Vitamin (MULTI VITAMIN) TABS Take 1 tablet by mouth daily. Centrum silver     nitroGLYCERIN (NITROSTAT) 0.4 MG SL tablet Place 1 tablet (0.4 mg total) under the tongue every 5 (five) minutes as needed for chest pain. 25 tablet 3   omeprazole (PRILOSEC) 20 MG capsule Take 1 capsule (20 mg total) by mouth daily. 30 capsule 0   rosuvastatin (CRESTOR) 40 MG tablet TAKE ONE TABLET BY MOUTH DAILY. NEEDS APPT FOR FURTHER REFILLS 90 tablet 3   spironolactone (ALDACTONE) 25 MG tablet TAKE ONE TABLET BY MOUTH DAILY 90 tablet 1   No current facility-administered medications for this visit.   BP 116/79   Pulse 68   Wt 212 lb 12.8 oz (96.5 kg)   SpO2 97%   BMI 29.68 kg/m   Wt Readings from Last 3 Encounters:  04/14/23 212 lb 12.8 oz (96.5 kg)  03/06/22 203 lb 9.6 oz (92.4 kg)  05/08/21 210 lb 6.4 oz (95.4 kg)   PHYSICAL EXAM: General:  Well appearing. No resp difficulty HEENT: normal Neck: supple. no JVD. Carotids 2+ bilat; no bruits. No lymphadenopathy or thryomegaly appreciated. Cor: PMI nondisplaced. Regular rate & rhythm. No rubs, gallops or murmurs. Lungs: clear Abdomen: soft, nontender, nondistended. No hepatosplenomegaly. No bruits or masses. Good bowel sounds. Extremities: no cyanosis, clubbing, rash, edema Neuro: alert & orientedx3, cranial nerves grossly intact. moves all 4 extremities w/o difficulty. Affect pleasant   EKG: Sinus 67 LAFB No ST-T wave abnormalities. Personally reviewed   ASSESSMENT & PLAN: 1. H/O Cardiac (VT arrest in setting of acute inferolateral STEMI) - OOH cardiac arrest on 02/15/20 with prolonged CPR, resuscitated w/ CPR + defibrillation   - Emergent cath 02/15/20 w/ totally occluded mid LCx treated w/ PCI + DES - Now off amio. No recurrence VT - Continue carvedilol 3.125 mg bid.   2. 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 RV normal - Echo (8/22): EF 55%, severe LV dysfunction, grade I DD - Echo 2/24 EF 50-55% RV ok Personally reviewed - Doing great. Volume looks good.  - Continue coreg 3.125 mg twice a day.  - Continue Entresto 49-51 mg bid.  - Continue spironolactone 25 mg daily. - Continue Jardiance 10 mg daily  - Continue current GDMT   3. CAD s/p STEMI  - Emergent cath 1/12 w/ totally occluded mid LCx treated w/ PCI + DES - There is  moderate ostial LAD disease and significant disease in the distal LAD close to the apex.  No significant disease affecting the right coronary artery. - No s/s angina - On DAPT + beta blocker. Can drop Plavix - Continue rosuvastatin, Zetia + Praluent (Followed by PCP) - Last LDL 45 in 3/23 - will recheck for him today   4. H/o CVA with ICH - MRI 1/22 with multiple small infarcts and small ICH (Likely cardioembolic from arrest). -No residual  5. DM2 - HgbA1c 6.6. - Continue SGLT2i  6. OSA - Borderline with AHI 5.6/hr by sleep study 12/22. - Did not tolerate CPAP. - No Change   Arvilla Meres, MD 04/14/23

## 2023-04-14 NOTE — Patient Instructions (Addendum)
 Medication Changes:  STOP Clopidogrel  Lab Work:  Go DOWN to LOWER LEVEL (LL) to have your blood work completed inside of [employer] office.  We will only call you if the results are abnormal or if the provider would like to make medication changes.   Testing/Procedures:  Your physician has requested that you have an echocardiogram. Echocardiography is a painless test that uses sound waves to create images of your heart. It provides your doctor with information about the size and shape of your heart and how well your heart's chambers and valves are working. This procedure takes approximately one hour. There are no restrictions for this procedure. Please do NOT wear cologne, perfume, aftershave, or lotions (deodorant is allowed). Please arrive 15 minutes prior to your appointment time.  Please note: We ask at that you not bring children with you during ultrasound (echo/ vascular) testing. Due to room size and safety concerns, children are not allowed in the ultrasound rooms during exams. Our front office staff cannot provide observation of children in our lobby area while testing is being conducted. An adult accompanying a patient to their appointment will only be allowed in the ultrasound room at the discretion of the ultrasound technician under special circumstances. We apologize for any inconvenience.  Please have your echo completed. You will check in for this at the [facility]. You have to arrive 15 MINS EARLY for preparation, otherwise you will have to reschedule.   Follow-Up in: Please follow up with the [facility] in 1 year with an echocardiogram. We currently do not have that schedule. Please give [place] a call around January 2026 in order to schedule this appointment.   At the [facility], you and your health needs are our priority. We have a designated team specialized in the treatment of Heart Failure. This Care Team includes your primary Heart  Failure Specialized Cardiologist (physician), Advanced Practice Providers (APPs- Physician Assistants and Nurse Practitioners), and Pharmacist who all work together to provide you with the care you need, when you need it.   You may see any of the following providers on your designated Care Team at your next follow up:  Dr. Arvilla Meres Dr. Marca Ancona Dr. Dorthula Nettles Dr. Theresia Bough Clarisa Kindred, NP Tonye Becket, NP Robbie Lis, [facility] [place], [place] Brynda Peon, NP [place] Lee, NP Karle Plumber, PharmD   Please be sure to bring in all your medications bottles to every appointment.   Need to Contact us:  If you have any questions or concerns before your next appointment please send [place] a message through [employer] or call our office at [phone].    TO LEAVE A MESSAGE FOR THE NURSE SELECT OPTION 2, PLEASE LEAVE A MESSAGE INCLUDING: YOUR NAME DATE OF BIRTH CALL BACK NUMBER REASON FOR CALL**this is important as we prioritize the call backs  YOU WILL RECEIVE A CALL BACK THE SAME DAY AS LONG AS YOU CALL BEFORE 4:00 PM

## 2023-04-15 LAB — COMPREHENSIVE METABOLIC PANEL
ALT: 11 IU/L (ref 0–44)
AST: 22 IU/L (ref 0–40)
Albumin: 4.5 g/dL (ref 3.9–4.9)
Alkaline Phosphatase: 80 IU/L (ref 44–121)
BUN/Creatinine Ratio: 15 (ref 10–24)
BUN: 14 mg/dL (ref 8–27)
Bilirubin Total: 0.5 mg/dL (ref 0.0–1.2)
CO2: 21 mmol/L (ref 20–29)
Calcium: 10.1 mg/dL (ref 8.6–10.2)
Chloride: 103 mmol/L (ref 96–106)
Creatinine, Ser: 0.94 mg/dL (ref 0.76–1.27)
Globulin, Total: 2.5 g/dL (ref 1.5–4.5)
Glucose: 88 mg/dL (ref 70–99)
Potassium: 4.8 mmol/L (ref 3.5–5.2)
Sodium: 139 mmol/L (ref 134–144)
Total Protein: 7 g/dL (ref 6.0–8.5)
eGFR: 89 mL/min/{1.73_m2} (ref >59)

## 2023-04-15 LAB — LIPID PANEL
Chol/HDL Ratio: 1.7 ratio (ref 0.0–5.0)
Cholesterol, Total: 106 mg/dL (ref 100–199)
HDL: 64 mg/dL (ref >39)
LDL Chol Calc (NIH): 25 mg/dL (ref 0–99)
Triglycerides: 84 mg/dL (ref 0–149)
VLDL Cholesterol Cal: 17 mg/dL (ref 5–40)

## 2023-05-07 ENCOUNTER — Encounter (HOSPITAL_COMMUNITY): Payer: Medicare PPO | Admitting: Internal Medicine

## 2023-07-14 ENCOUNTER — Other Ambulatory Visit (HOSPITAL_COMMUNITY): Payer: Self-pay | Admitting: Family Medicine

## 2023-07-14 ENCOUNTER — Other Ambulatory Visit (HOSPITAL_COMMUNITY): Payer: Self-pay | Admitting: Internal Medicine

## 2023-07-23 ENCOUNTER — Other Ambulatory Visit (HOSPITAL_COMMUNITY): Payer: Self-pay | Admitting: Internal Medicine

## 2023-07-27 ENCOUNTER — Other Ambulatory Visit (HOSPITAL_COMMUNITY): Payer: Self-pay | Admitting: Cardiology

## 2023-07-31 ENCOUNTER — Other Ambulatory Visit (HOSPITAL_COMMUNITY): Payer: Self-pay | Admitting: Family Medicine

## 2023-09-21 ENCOUNTER — Other Ambulatory Visit: Payer: Self-pay | Admitting: Internal Medicine

## 2023-09-23 ENCOUNTER — Other Ambulatory Visit (HOSPITAL_COMMUNITY): Payer: Self-pay | Admitting: Internal Medicine

## 2023-10-12 ENCOUNTER — Other Ambulatory Visit (HOSPITAL_COMMUNITY): Payer: Self-pay | Admitting: Internal Medicine

## 2023-10-12 ENCOUNTER — Other Ambulatory Visit (HOSPITAL_COMMUNITY): Payer: Self-pay | Admitting: Family Medicine

## 2023-10-23 ENCOUNTER — Telehealth: Payer: Self-pay

## 2023-10-23 NOTE — Telephone Encounter (Signed)
  Patient Consent for Virtual Visit        Jeffrey Morse has provided verbal consent on 10/23/2023 for a virtual visit (video or telephone).   CONSENT FOR VIRTUAL VISIT FOR:  Jeffrey Morse  By participating in this virtual visit I agree to the following:  I hereby voluntarily request, consent and authorize [facility] and its employed or contracted physicians, physician assistants, nurse practitioners or other licensed health care professionals (the Practitioner), to provide me with telemedicine health care services (the "Services) as deemed necessary by the treating Practitioner. I acknowledge and consent to receive the Services by the Practitioner via telemedicine. I understand that the telemedicine visit will involve communicating with the Practitioner through live audiovisual communication technology and the disclosure of certain medical information by electronic transmission. I acknowledge that I have been given the opportunity to request an in-person assessment or other available alternative prior to the telemedicine visit and am voluntarily participating in the telemedicine visit.  I understand that I have the right to withhold or withdraw my consent to the use of telemedicine in the course of my care at any time, without affecting my right to future care or treatment, and that the Practitioner or I may terminate the telemedicine visit at any time. I understand that I have the right to inspect all information obtained and/or recorded in the course of the telemedicine visit and may receive copies of available information for a reasonable fee.  I understand that some of the potential risks of receiving the Services via telemedicine include:  Delay or interruption in medical evaluation due to technological equipment failure or disruption; Information transmitted may not be sufficient (e.g. poor resolution of images) to allow for appropriate medical decision making by the  Practitioner; and/or  In rare instances, security protocols could fail, causing a breach of personal health information.  Furthermore, I acknowledge that it is my responsibility to provide information about my medical history, conditions and care that is complete and accurate to the best of my ability. I acknowledge that Practitioner's advice, recommendations, and/or decision may be based on factors not within their control, such as incomplete or inaccurate data provided by me or distortions of diagnostic images or specimens that may result from electronic transmissions. I understand that the practice of medicine is not an exact science and that Practitioner makes no warranties or guarantees regarding treatment outcomes. I acknowledge that a copy of this consent can be made available to me via my patient portal [facility] MyChart), or I can request a printed copy by calling the office of [facility].    I understand that my insurance will be billed for this visit.   I have read or had this consent read to me. I understand the contents of this consent, which adequately explains the benefits and risks of the Services being provided via telemedicine.  I have been provided ample opportunity to ask questions regarding this consent and the Services and have had my questions answered to my satisfaction. I give my informed consent for the services to be provided through the use of telemedicine in my medical care

## 2023-10-23 NOTE — Telephone Encounter (Signed)
   Pre-operative Risk Assessment    Patient Name: Jeffrey Morse  DOB: [birth year] MRN: [id number]   Date of last office visit: 04/14/23 TORIBIO FUEL, MD [facility]) Date of next office visit: NONE   Request for Surgical Clearance    Procedure:  RIGHT SHOULDER SCOPE SAD, MINI OPEN RCR, BICEPS TENODESIS  Date of Surgery:  Clearance TBD                                Surgeon:  DR ELSPETH HER Surgeon's Group or Practice Name:  [facility] Phone number:  [phone] Fax number:  [phone]   ATTN: MEGAN DAVIS   Type of Clearance Requested:   - Medical  - Pharmacy:  Hold Aspirin      Type of Anesthesia:  CHOICE WITH INTERSCALENE BLOCK   Additional requests/questions:    SignedLucie DELENA Ku   10/23/2023, 7:34 AM

## 2023-10-23 NOTE — Telephone Encounter (Signed)
 Pt scheduled for VV on 11/17/23.

## 2023-10-23 NOTE — Telephone Encounter (Signed)
   Name: Jeffrey Morse  DOB: 21-May-1955  MRN: [id number]  Primary Cardiologist: None  Chart reviewed as part of pre-operative protocol coverage. Because of Jeffrey Morse's past medical history and time since last visit, he will require a follow-up telephone visit in order to better assess preoperative cardiovascular risk.  Pre-op covering staff: - Please schedule appointment and call patient to inform them. If patient already had an upcoming appointment within acceptable timeframe, please add pre-op clearance to the appointment notes so provider is aware. - Please contact requesting surgeon's office via preferred method (i.e, phone, fax) to inform them of need for appointment prior to surgery.  Cardiac arrest occurred back in 2022.  As long as patient is asymptomatic from a cardiac standpoint, okay to hold aspirin  x 5 to 7 days prior to surgery.  Please resume when medically safe to do so.  Jeffrey LOISE Fabry, PA-C  10/23/2023, 8:04 AM

## 2023-10-23 NOTE — Telephone Encounter (Signed)
 Called patient left voicemail 10/23/23

## 2023-10-26 ENCOUNTER — Other Ambulatory Visit (HOSPITAL_COMMUNITY): Payer: Self-pay | Admitting: Cardiology

## 2023-11-16 ENCOUNTER — Other Ambulatory Visit (HOSPITAL_COMMUNITY): Payer: Self-pay | Admitting: Internal Medicine

## 2023-11-17 ENCOUNTER — Ambulatory Visit: Attending: Cardiology | Admitting: Physician Assistant

## 2023-11-17 ENCOUNTER — Ambulatory Visit: Admitting: Cardiology

## 2023-11-17 DIAGNOSIS — Z01810 Encounter for preprocedural cardiovascular examination: Secondary | ICD-10-CM

## 2023-11-17 NOTE — Progress Notes (Signed)
 Virtual Visit via Telephone Note   Because of Jeffrey Morse co-morbid illnesses, he is at least at moderate risk for complications without adequate follow up.  This format is felt to be most appropriate for this patient at this time.  Due to technical limitations with video connection (technology), today's appointment will be conducted as an audio only telehealth visit, and Jeffrey Morse verbally agreed to proceed in this manner.   All issues noted in this document were discussed and addressed.  No physical exam could be performed with this format.  Evaluation Performed:  Preoperative cardiovascular risk assessment _____________   Date:  11/17/2023   Patient ID:  Jeffrey Morse, Jeffrey Morse [birth year], MRN [id number] Patient Location:  Home Provider location:   Office  Primary Care Provider:  Default, Provider, MD Primary Cardiologist:  None  Chief Complaint / Patient Profile   68 y.o. 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  History of Present Illness    Jeffrey Morse is a 68 y.o. male who presents via audio/video conferencing for a telehealth visit today.  Pt was last seen in cardiology clinic on 04/14/23 by Dr. Cherrie.  At that time Jeffrey Morse was doing well .  The patient is now pending procedure as outlined above. Since his last visit, he has not had any issues with his heart to his knowledge.  He is able to do most of what he wants to do at this point.  He has had 2 back surgeries to things where he has been over do bother his back.  However, he does surpass 4 METS on the DASI.  He enjoys hunting as a past time.  Cardiac arrest and CVA with  ICH occurred back in 2022.  Okay to hold aspirin  x 5 to 7 days prior to surgery since patient is asymptomatic at the time of phone call.  Please resume when medically safe to do so. He is off his plavix  as of his March office visit with Dr. Cherrie.  Past Medical History    Past Medical History:  Diagnosis Date   HTN (hypertension)    Hypercholesteremia    Hypertension    Borderline hypertension   Sleep apnea    Stroke [facility])    Past Surgical History:  Procedure Laterality Date   BACK SURGERY     CARDIAC CATHETERIZATION     CORONARY STENT INTERVENTION N/A 02/15/2020   Procedure: CORONARY STENT INTERVENTION;  Surgeon: Darron Deatrice LABOR, MD;  Location: [facility] INVASIVE CV LAB;  Service: Cardiovascular;  Laterality: N/A;  CFX   KNEE SURGERY Left 2012   LEFT HEART CATH AND CORONARY ANGIOGRAPHY N/A 02/15/2020   Procedure: LEFT HEART CATH AND CORONARY ANGIOGRAPHY;  Surgeon: Darron Deatrice LABOR, MD;  Location: [facility] INVASIVE CV LAB;  Service: Cardiovascular;  Laterality: N/A;   RIGHT HEART CATH N/A 02/15/2020   Procedure: RIGHT HEART CATH;  Surgeon: Darron Deatrice LABOR, MD;  Location: [facility] INVASIVE CV LAB;  Service: Cardiovascular;  Laterality: N/A;   VENTRICULAR ASSIST DEVICE INSERTION N/A 02/15/2020   Procedure: VENTRICULAR ASSIST DEVICE INSERTION;  Surgeon: Darron Deatrice LABOR, MD;  Location: [facility] INVASIVE CV LAB;  Service: Cardiovascular;  Laterality: N/A;  iMPELLA  Allergies  Allergies  Allergen Reactions   Atorvastatin      Other reaction(s): back pain (10/2017)   Rosuvastatin  Calcium      Other reaction(s): back pain (01/2018)    Home Medications    Prior to Admission medications   Medication Sig Start Date End Date Taking? Authorizing Provider  acetaminophen  (TYLENOL ) 325 MG tablet Take 2 tablets (650 mg total) by mouth every 4 (four) hours as needed for mild pain (or temp > 37.5 C (99.5 F)). 03/08/20   Angiulli, Toribio PARAS, PA-C  aspirin  EC 81 MG tablet Take 1 tablet (81 mg total) by mouth daily.  Swallow whole. 05/08/21   [place]ford, Harlene HERO, FNP  carvedilol  (COREG ) 3.125 MG tablet TAKE ONE TABLET (3.125 MG TOTAL) BY MOUTH TWO TIMES DAILY WITH A MEAL. 11/16/23   Bensimhon, Toribio SAUNDERS, MD  ENTRESTO  49-51 MG TAKE ONE TABLET BY MOUTH TWO (TWO) TIMES DAILY. NEEDS FOLLOW UP APPOINTMENT FOR MORE REFILLS 10/28/23   Bensimhon, Toribio SAUNDERS, MD  escitalopram  (LEXAPRO ) 20 MG tablet Take 20 mg by mouth daily.    [provider]  ezetimibe  (ZETIA ) 10 MG tablet TAKE ONE TABLET BY MOUTH ONCE A DAY 10/12/23   Bensimhon, Toribio SAUNDERS, MD  JARDIANCE  10 MG TABS tablet TAKE ONE TABLET BY MOUTH DAILY 04/10/23   Bensimhon, Daniel R, MD  Multiple Vitamin (MULTI VITAMIN) TABS Take 1 tablet by mouth daily. Centrum silver 03/09/20   [provider]  nitroGLYCERIN  (NITROSTAT ) 0.4 MG SL tablet Place 1 tablet (0.4 mg total) under the tongue every 5 (five) minutes as needed for chest pain. 02/20/23 10/23/23  Bensimhon, Toribio SAUNDERS, MD  omeprazole  (PRILOSEC) 20 MG capsule Take 1 capsule (20 mg total) by mouth daily. 03/08/20   Angiulli, Toribio PARAS, PA-C  REPATHA  SURECLICK 140 MG/ML SOAJ INJECT 140 MG INTO THE SKIN EVERY 14 (FOURTEEN) DAYS. 09/21/23   Bensimhon, Toribio SAUNDERS, MD  rosuvastatin  (CRESTOR ) 40 MG tablet TAKE ONE TABLET BY MOUTH ONCE A DAY 10/12/23   Milford, Harlene HERO, FNP  spironolactone  (ALDACTONE ) 25 MG tablet TAKE ONE TABLET BY MOUTH DAILY 08/03/23   Bensimhon, Toribio SAUNDERS, MD    Physical Exam    Vital Signs:  Jeffrey Morse does not have vital signs available for review today.  Given telephonic nature of communication, physical exam is limited. AAOx3. NAD. Normal affect.  Speech and respirations are unlabored.  Accessory Clinical Findings    None  Assessment & Plan    1.  Preoperative Cardiovascular Risk Assessment:  Jeffrey Morse perioperative risk of a major cardiac event is 11% according to the Revised Cardiac Risk Index (RCRI).  Therefore, he is at high risk for perioperative complications.   His functional  capacity is good at 6.61 METs according to the [employer] Activity Status Index (DASI). Recommendations: According to ACC/AHA guidelines, no further cardiovascular testing needed.  The patient may proceed to surgery at acceptable risk.   Antiplatelet and/or Anticoagulation Recommendations: Aspirin  can be held for 5-7 days prior to his surgery.  Please resume Aspirin  post operatively when it is felt to be safe from a bleeding standpoint.   The patient was advised that if he develops new symptoms prior to surgery to contact our office to arrange for a follow-up visit, and he verbalized understanding.  A copy of this note will be routed to requesting surgeon.  Time:   Today, I have spent 10 minutes with the patient with telehealth technology discussing medical history, symptoms, and management plan.  Jeffrey LOISE Fabry, PA-C  11/17/2023, 8:41 AM

## 2023-12-02 ENCOUNTER — Ambulatory Visit
Admission: RE | Admit: 2023-12-02 | Discharge: 2023-12-02 | Disposition: A | Discharge status: Home / Self Care | Source: Ambulatory Visit | Attending: Internal Medicine | Admitting: Internal Medicine

## 2023-12-02 DIAGNOSIS — I340 Nonrheumatic mitral (valve) insufficiency: Secondary | ICD-10-CM | POA: Diagnosis not present

## 2023-12-02 DIAGNOSIS — I252 Old myocardial infarction: Secondary | ICD-10-CM | POA: Diagnosis not present

## 2023-12-02 DIAGNOSIS — I77810 Thoracic aortic ectasia: Secondary | ICD-10-CM | POA: Diagnosis not present

## 2023-12-02 DIAGNOSIS — I5022 Chronic systolic (congestive) heart failure: Secondary | ICD-10-CM | POA: Insufficient documentation

## 2023-12-02 LAB — ECHOCARDIOGRAM COMPLETE
AR max vel: 2.94 cm2
AV Peak grad: 9.1 mmHg
Ao pk vel: 1.51 m/s
Area-P 1/2: 2.5 cm2
S' Lateral: 2.9 cm

## 2023-12-02 NOTE — Progress Notes (Signed)
 Echocardiogram 2D Echocardiogram has been performed.  Jeffrey Morse 12/02/2023, 9:36 AM

## 2024-01-08 ENCOUNTER — Other Ambulatory Visit (HOSPITAL_COMMUNITY): Payer: Self-pay | Admitting: Family Medicine

## 2024-01-08 ENCOUNTER — Other Ambulatory Visit (HOSPITAL_COMMUNITY): Payer: Self-pay | Admitting: Internal Medicine
